# Patient Record
Sex: Female | Born: 1955 | Race: Black or African American | Hispanic: No | Marital: Single | Smoking: Never smoker
Health system: Southern US, Community
[De-identification: ages and names within clinical notes are randomized; demographics above are authoritative.]

## PROBLEM LIST (undated history)

## (undated) DIAGNOSIS — Z9221 Personal history of antineoplastic chemotherapy: Secondary | ICD-10-CM

## (undated) DIAGNOSIS — Z95810 Presence of automatic (implantable) cardiac defibrillator: Secondary | ICD-10-CM

## (undated) DIAGNOSIS — Z808 Family history of malignant neoplasm of other organs or systems: Secondary | ICD-10-CM

## (undated) DIAGNOSIS — I509 Heart failure, unspecified: Secondary | ICD-10-CM

## (undated) DIAGNOSIS — C801 Malignant (primary) neoplasm, unspecified: Secondary | ICD-10-CM

## (undated) DIAGNOSIS — Z923 Personal history of irradiation: Secondary | ICD-10-CM

## (undated) DIAGNOSIS — I10 Essential (primary) hypertension: Secondary | ICD-10-CM

## (undated) DIAGNOSIS — Z803 Family history of malignant neoplasm of breast: Secondary | ICD-10-CM

## (undated) DIAGNOSIS — E119 Type 2 diabetes mellitus without complications: Secondary | ICD-10-CM

## (undated) DIAGNOSIS — Z8042 Family history of malignant neoplasm of prostate: Secondary | ICD-10-CM

## (undated) HISTORY — PX: ABDOMINAL AORTIC ANEURYSM REPAIR: SUR1152

## (undated) HISTORY — DX: Family history of malignant neoplasm of breast: Z80.3

## (undated) HISTORY — DX: Family history of malignant neoplasm of prostate: Z80.42

## (undated) HISTORY — PX: CARDIAC CATHETERIZATION: SHX172

## (undated) HISTORY — DX: Malignant (primary) neoplasm, unspecified: C80.1

## (undated) HISTORY — PX: ABDOMINAL HYSTERECTOMY: SHX81

## (undated) HISTORY — DX: Family history of malignant neoplasm of other organs or systems: Z80.8

---

## 1999-02-08 ENCOUNTER — Encounter: Payer: Self-pay | Admitting: *Deleted

## 1999-02-13 ENCOUNTER — Inpatient Hospital Stay (HOSPITAL_COMMUNITY): Admission: RE | Admit: 1999-02-13 | Discharge: 1999-02-16 | Payer: Self-pay | Admitting: *Deleted

## 1999-03-21 ENCOUNTER — Encounter (HOSPITAL_COMMUNITY): Admission: RE | Admit: 1999-03-21 | Discharge: 1999-06-19 | Payer: Self-pay | Admitting: Interventional Cardiology

## 1999-08-14 ENCOUNTER — Ambulatory Visit (HOSPITAL_COMMUNITY): Admission: RE | Admit: 1999-08-14 | Discharge: 1999-08-14 | Payer: Self-pay | Admitting: Internal Medicine

## 1999-08-14 ENCOUNTER — Encounter: Payer: Self-pay | Admitting: Internal Medicine

## 1999-08-17 ENCOUNTER — Encounter: Payer: Self-pay | Admitting: Internal Medicine

## 1999-08-17 ENCOUNTER — Ambulatory Visit (HOSPITAL_COMMUNITY): Admission: RE | Admit: 1999-08-17 | Discharge: 1999-08-17 | Payer: Self-pay | Admitting: Internal Medicine

## 2000-12-12 ENCOUNTER — Other Ambulatory Visit: Admission: RE | Admit: 2000-12-12 | Discharge: 2000-12-12 | Payer: Self-pay | Admitting: Internal Medicine

## 2000-12-17 ENCOUNTER — Ambulatory Visit (HOSPITAL_COMMUNITY): Admission: RE | Admit: 2000-12-17 | Discharge: 2000-12-17 | Payer: Self-pay | Admitting: Internal Medicine

## 2000-12-17 ENCOUNTER — Encounter: Payer: Self-pay | Admitting: Internal Medicine

## 2000-12-23 ENCOUNTER — Encounter: Admission: RE | Admit: 2000-12-23 | Discharge: 2000-12-23 | Payer: Self-pay | Admitting: Internal Medicine

## 2000-12-23 ENCOUNTER — Encounter: Payer: Self-pay | Admitting: Internal Medicine

## 2001-01-10 ENCOUNTER — Encounter: Admission: RE | Admit: 2001-01-10 | Discharge: 2001-04-10 | Payer: Self-pay | Admitting: Internal Medicine

## 2004-03-06 ENCOUNTER — Encounter: Admission: RE | Admit: 2004-03-06 | Discharge: 2004-03-06 | Payer: Self-pay | Admitting: Internal Medicine

## 2006-03-05 ENCOUNTER — Encounter: Admission: RE | Admit: 2006-03-05 | Discharge: 2006-03-05 | Payer: Self-pay | Admitting: Internal Medicine

## 2006-03-25 ENCOUNTER — Encounter (INDEPENDENT_AMBULATORY_CARE_PROVIDER_SITE_OTHER): Payer: Self-pay | Admitting: *Deleted

## 2006-03-25 ENCOUNTER — Ambulatory Visit (HOSPITAL_COMMUNITY): Admission: RE | Admit: 2006-03-25 | Discharge: 2006-03-25 | Payer: Self-pay | Admitting: Internal Medicine

## 2006-05-08 ENCOUNTER — Ambulatory Visit (HOSPITAL_COMMUNITY): Admission: RE | Admit: 2006-05-08 | Discharge: 2006-05-08 | Payer: Self-pay | Admitting: Gastroenterology

## 2007-03-12 ENCOUNTER — Encounter: Admission: RE | Admit: 2007-03-12 | Discharge: 2007-03-12 | Payer: Self-pay | Admitting: Family Medicine

## 2010-01-31 ENCOUNTER — Encounter: Admission: RE | Admit: 2010-01-31 | Discharge: 2010-01-31 | Payer: Self-pay | Admitting: Family Medicine

## 2010-12-10 ENCOUNTER — Encounter: Payer: Self-pay | Admitting: Internal Medicine

## 2011-03-12 ENCOUNTER — Other Ambulatory Visit: Payer: Self-pay | Admitting: Family Medicine

## 2011-03-12 DIAGNOSIS — Z1231 Encounter for screening mammogram for malignant neoplasm of breast: Secondary | ICD-10-CM

## 2011-03-16 ENCOUNTER — Ambulatory Visit
Admission: RE | Admit: 2011-03-16 | Discharge: 2011-03-16 | Disposition: A | Discharge status: Home / Self Care | Payer: 59 | Source: Ambulatory Visit | Attending: Family Medicine | Admitting: Family Medicine

## 2011-03-16 DIAGNOSIS — Z1231 Encounter for screening mammogram for malignant neoplasm of breast: Secondary | ICD-10-CM

## 2011-04-06 NOTE — Op Note (Signed)
NAMEMISA, Toni [name]                ACCOUNT NO.:  [id number]   MEDICAL RECORD NO.:  [id number]          PATIENT TYPE:  AMB   LOCATION:  ENDO                         FACILITY:  [facility]   PHYSICIAN:  Danise Edge, M.D.   DATE OF BIRTH:  07/22/1956   DATE OF PROCEDURE:  05/08/2006  DATE OF DISCHARGE:                                 OPERATIVE REPORT   REFERRING PHYSICIAN:  Dr. Julieanne Manson.   PROCEDURE INDICATIONS:  Toni [name] is a 55 year old female born 1955/12/12.  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  She is  scheduled to undergo her first screening colonoscopy with polypectomy to  prevent colon cancer.   ENDOSCOPIST:  Danise Edge, M.D.   PREMEDICATION:  Fentanyl 75 mcg, Versed 5 mg.   PROCEDURE:  After obtaining informed consent, [name] was placed in the  left lateral decubitus position.  I administered intravenous fentanyl and  intravenous Versed to achieve conscious sedation for the procedure.  The  patient's blood pressure, oxygen saturation and cardiac rhythm were  monitored throughout the procedure and documented in the medical record.   Anal inspection and digital rectal exam were normal.  The Olympus adjustable  pediatric colonoscope was introduced into the rectum and advanced to the  cecum.  A normal-appearing ileocecal valve and appendiceal orifice were  identified.  Colonic preparation for the exam today was excellent.   Rectum normal.  Retroflexed view of the distal rectum normal.  Sigmoid colon and descending colon normal.  Splenic flexure normal.  Transverse colon normal.  Hepatic flexure normal.  Ascending colon normal.  Cecum and ileocecal valve normal.   ASSESSMENT:  Normal screening proctocolonoscopy to the cecum.  No endoscopic  evidence for the presence of colorectal neoplasia.   RECOMMENDATIONS:  Repeat screening optical colonoscopy in 10 years.  Consider screening virtual colonoscopy in 5 years if technology  is  available.           ______________________________  Danise Edge, M.D.     MJ/MEDQ  D:  05/08/2006  T:  05/08/2006  Job:  [id number]   cc:   Marcene Duos, M.D.  Fax: [phone]

## 2011-07-04 ENCOUNTER — Other Ambulatory Visit: Payer: Self-pay | Admitting: Family Medicine

## 2011-07-04 NOTE — Telephone Encounter (Signed)
Dr.Knapp,  Pt not seen here and no future appt scheduled. Thanks.

## 2011-08-15 ENCOUNTER — Ambulatory Visit (HOSPITAL_COMMUNITY)
Admission: RE | Admit: 2011-08-15 | Discharge: 2011-08-15 | Disposition: A | Discharge status: Home / Self Care | Payer: 59 | Source: Ambulatory Visit | Attending: Interventional Cardiology | Admitting: Interventional Cardiology

## 2011-08-15 DIAGNOSIS — E785 Hyperlipidemia, unspecified: Secondary | ICD-10-CM | POA: Insufficient documentation

## 2011-08-15 DIAGNOSIS — E119 Type 2 diabetes mellitus without complications: Secondary | ICD-10-CM | POA: Insufficient documentation

## 2011-08-15 DIAGNOSIS — I509 Heart failure, unspecified: Secondary | ICD-10-CM | POA: Insufficient documentation

## 2011-08-15 DIAGNOSIS — I10 Essential (primary) hypertension: Secondary | ICD-10-CM | POA: Insufficient documentation

## 2012-05-05 ENCOUNTER — Other Ambulatory Visit: Payer: Self-pay | Admitting: Family Medicine

## 2012-05-05 DIAGNOSIS — Z1231 Encounter for screening mammogram for malignant neoplasm of breast: Secondary | ICD-10-CM

## 2012-05-06 ENCOUNTER — Ambulatory Visit
Admission: RE | Admit: 2012-05-06 | Discharge: 2012-05-06 | Disposition: A | Discharge status: Home / Self Care | Payer: 59 | Source: Ambulatory Visit | Attending: Family Medicine | Admitting: Family Medicine

## 2012-05-06 DIAGNOSIS — Z1231 Encounter for screening mammogram for malignant neoplasm of breast: Secondary | ICD-10-CM

## 2012-11-18 ENCOUNTER — Other Ambulatory Visit: Payer: Self-pay | Admitting: Family Medicine

## 2012-11-18 DIAGNOSIS — Z780 Asymptomatic menopausal state: Secondary | ICD-10-CM

## 2013-01-22 ENCOUNTER — Encounter: Payer: 59 | Attending: Family Medicine | Admitting: *Deleted

## 2013-01-22 ENCOUNTER — Encounter: Payer: Self-pay | Admitting: *Deleted

## 2013-01-22 DIAGNOSIS — Z713 Dietary counseling and surveillance: Secondary | ICD-10-CM | POA: Insufficient documentation

## 2013-01-22 DIAGNOSIS — E119 Type 2 diabetes mellitus without complications: Secondary | ICD-10-CM | POA: Insufficient documentation

## 2013-01-22 NOTE — Patient Instructions (Signed)
Plan:  Aim for 3 Carb Choices per meal (45 grams) +/- 1 either way  Aim for 0-1 Carbs per snack if hungry  Consider reading food labels for Total Carbohydrate and Fat Grams of foods Consider ways of increasing your activity level by classes at the gym or treadmill for 15-30 minutes daily as tolerated

## 2013-01-22 NOTE — Progress Notes (Signed)
  Medical Nutrition Therapy:  Appt start time: 1530 end time:  1630.  Assessment:  Primary concerns today: patient self referred for diabetes .Works full time at [employer] in [employer] [place] from 8-4:30. Lives alone. Enjoys yard Airline pilot, shopping, reading, going to gym if going with somebody else. Shops and cooks her own meals  MEDICATIONS: see list   DIETARY INTAKE:  Usual eating pattern includes 3 meals and 0 snacks per day.  Everyday foods include good variety of all food groups.  Avoided foods include sodas.    24-hr recall:  B ( AM): not hungry but making herself eat instant oatmeal packet, organic milk, egg whites OR Protein bar with milk OR Glucerna  Snk ( AM): none  L ( PM): brings frozen dinner OR salad from cafeteria.OR sandwich and fruit OR UTZ snack from vending machine, Crystal LIght or water Snk ( PM): not usually D ( PM): hungry, eats thru the night, frozen dinner OR pick up [employer] OR subway OR meat, starch vegetable type meal, enjoys dessert Snk ( PM): none Beverages: water, Crystal LIght OR Mio,   Usual physical activity: treadmill at home and has [employer] with aerobic and machines  Estimated energy needs: 1400 calories 158 g carbohydrates 105 g protein 39 g fat  Progress Towards Goal(s):  In progress.   Nutritional Diagnosis:  NB-1.1 Food and nutrition-related knowledge deficit As related to new diagnosis of diabetes.  As evidenced by A1c if 6.7%.    Intervention:  Nutrition counseling and diabetes education initiated. Discussed basic physiology of diabetes, SMBG and rationale of checking BG at alternate times of day, A1c, Carb Counting and reading food labels, and benefits of increased activity.   Plan:  Aim for 3 Carb Choices per meal (45 grams) +/- 1 either way  Aim for 0-1 Carbs per snack if hungry  Consider reading food labels for Total Carbohydrate and Fat Grams of foods Consider ways of increasing your activity level by classes at the gym or  treadmill for 15-30 minutes daily as tolerated  Handouts given during visit include: Living Well with Diabetes Carb Counting and Food Label handouts Meal Plan Card   Monitoring/Evaluation:  Dietary intake, exercise, reading food labels, and body weight prn.

## 2013-05-14 ENCOUNTER — Encounter (HOSPITAL_COMMUNITY): Payer: Self-pay | Admitting: Emergency Medicine

## 2013-05-14 ENCOUNTER — Emergency Department (HOSPITAL_COMMUNITY): Payer: 59

## 2013-05-14 ENCOUNTER — Emergency Department (HOSPITAL_COMMUNITY)
Admission: EM | Admit: 2013-05-14 | Discharge: 2013-05-14 | Disposition: A | Discharge status: Home / Self Care | Payer: 59 | Attending: Emergency Medicine | Admitting: Emergency Medicine

## 2013-05-14 DIAGNOSIS — I509 Heart failure, unspecified: Secondary | ICD-10-CM | POA: Insufficient documentation

## 2013-05-14 DIAGNOSIS — R109 Unspecified abdominal pain: Secondary | ICD-10-CM

## 2013-05-14 DIAGNOSIS — I2510 Atherosclerotic heart disease of native coronary artery without angina pectoris: Secondary | ICD-10-CM | POA: Insufficient documentation

## 2013-05-14 DIAGNOSIS — N201 Calculus of ureter: Secondary | ICD-10-CM | POA: Insufficient documentation

## 2013-05-14 DIAGNOSIS — Z79899 Other long term (current) drug therapy: Secondary | ICD-10-CM | POA: Insufficient documentation

## 2013-05-14 DIAGNOSIS — E119 Type 2 diabetes mellitus without complications: Secondary | ICD-10-CM | POA: Insufficient documentation

## 2013-05-14 DIAGNOSIS — I10 Essential (primary) hypertension: Secondary | ICD-10-CM | POA: Insufficient documentation

## 2013-05-14 DIAGNOSIS — R112 Nausea with vomiting, unspecified: Secondary | ICD-10-CM | POA: Insufficient documentation

## 2013-05-14 HISTORY — DX: Essential (primary) hypertension: I10

## 2013-05-14 HISTORY — DX: Heart failure, unspecified: I50.9

## 2013-05-14 HISTORY — DX: Type 2 diabetes mellitus without complications: E11.9

## 2013-05-14 LAB — COMPREHENSIVE METABOLIC PANEL
CO2: 28 mEq/L (ref 19–32)
Chloride: 99 mEq/L (ref 96–112)
GFR calc non Af Amer: >90 mL/min (ref >90)
Glucose, Bld: 219 mg/dL — ABNORMAL HIGH (ref 70–99)
Sodium: 137 mEq/L (ref 135–145)
Total Bilirubin: 0.3 mg/dL (ref 0.3–1.2)
Total Protein: 8 g/dL (ref 6.0–8.3)

## 2013-05-14 LAB — POCT I-STAT, CHEM 8
BUN: 14 mg/dL (ref 6–23)
Calcium, Ion: 1.17 mmol/L (ref 1.12–1.23)
Chloride: 100 mEq/L (ref 96–112)
Glucose, Bld: 219 mg/dL — ABNORMAL HIGH (ref 70–99)
Sodium: 139 mEq/L (ref 135–145)
TCO2: 27 mmol/L (ref 0–100)

## 2013-05-14 LAB — URINE MICROSCOPIC-ADD ON

## 2013-05-14 LAB — CBC WITH DIFFERENTIAL/PLATELET
Basophils Absolute: 0 10*3/uL (ref 0.0–0.1)
Basophils Relative: 0 % (ref 0–1)
Eosinophils Absolute: 0.2 10*3/uL (ref 0.0–0.7)
Eosinophils Relative: 2 % (ref 0–5)
Lymphocytes Relative: 13 % (ref 12–46)
Lymphs Abs: 1.3 10*3/uL (ref 0.7–4.0)
MCV: 87 fL (ref 78.0–100.0)
Monocytes Absolute: 0.4 10*3/uL (ref 0.1–1.0)
Monocytes Relative: 4 % (ref 3–12)
Neutro Abs: 7.9 10*3/uL — ABNORMAL HIGH (ref 1.7–7.7)
RBC: 4.71 MIL/uL (ref 3.87–5.11)
WBC: 9.7 10*3/uL (ref 4.0–10.5)

## 2013-05-14 LAB — URINALYSIS, ROUTINE W REFLEX MICROSCOPIC
Bilirubin Urine: NEGATIVE
Protein, ur: NEGATIVE mg/dL
Specific Gravity, Urine: 1.023 (ref 1.005–1.030)
pH: 7 (ref 5.0–8.0)

## 2013-05-14 MED ORDER — IBUPROFEN 600 MG PO TABS: 600.0000 mg | ORAL_TABLET | Freq: Four times a day (QID) | ORAL | Status: DC | PRN

## 2013-05-14 MED ORDER — TAMSULOSIN HCL 0.4 MG PO CAPS: 0.4000 mg | ORAL_CAPSULE | Freq: Every day | ORAL | Status: DC

## 2013-05-14 MED ORDER — ONDANSETRON HCL 4 MG/2ML IJ SOLN
4.0000 mg | Freq: Once | INTRAMUSCULAR | Status: AC
  Administered 2013-05-14: 4 mg via INTRAVENOUS
  Filled 2013-05-14: qty 2

## 2013-05-14 MED ORDER — ONDANSETRON 4 MG PO TBDP: ORAL_TABLET | ORAL | Status: DC

## 2013-05-14 MED ORDER — SODIUM CHLORIDE 0.9 % IV BOLUS (SEPSIS)
1000.0000 mL | Freq: Once | INTRAVENOUS | Status: AC
  Administered 2013-05-14: 1000 mL via INTRAVENOUS

## 2013-05-14 MED ORDER — MORPHINE SULFATE 4 MG/ML IJ SOLN
4.0000 mg | Freq: Once | INTRAMUSCULAR | Status: AC
  Administered 2013-05-14: 4 mg via INTRAVENOUS
  Filled 2013-05-14: qty 1

## 2013-05-14 MED ORDER — HYDROCODONE-ACETAMINOPHEN 5-325 MG PO TABS: 1.0000 | ORAL_TABLET | ORAL | Status: DC | PRN

## 2013-05-14 MED ORDER — IOHEXOL 300 MG/ML  SOLN: 25.0000 mL | INTRAMUSCULAR | Status: AC

## 2013-05-14 MED ORDER — IOHEXOL 300 MG/ML  SOLN
100.0000 mL | Freq: Once | INTRAMUSCULAR | Status: AC | PRN
  Administered 2013-05-14: 100 mL via INTRAVENOUS

## 2013-05-14 NOTE — ED Notes (Signed)
Pt reports RLQ pain which began this AM. Pt states she was at woke this AM and began feeling bad vomiting. Denies diarrhea or fever. Seen at [facility] this AM and sent here for further eval. Pt alert, oriented x4, moderate amount of distress and guarding right side.

## 2013-05-14 NOTE — ED Provider Notes (Signed)
I saw and evaluated the patient, reviewed the resident's note and I agree with the findings and plan. Patient with abdominal pain. Stone on CT. Pain is controlled. Will follow with urology  Juliet Rude. Rubin Payor, MD 05/14/13 1624

## 2013-05-14 NOTE — ED Provider Notes (Signed)
History    CSN: [id number] Arrival date & time 05/14/13  4098  First MD Initiated Contact with Patient 05/14/13 1001     Chief Complaint  Patient presents with  . Abdominal Pain   (Consider location/radiation/quality/duration/timing/severity/associated sxs/prior Treatment) Patient is a 57 y.o. female presenting with abdominal pain. The history is provided by the patient.  Abdominal Pain This is a new problem. The current episode started today. The problem occurs constantly. The problem has been gradually worsening. Associated symptoms include abdominal pain, anorexia, nausea and vomiting. Pertinent negatives include no chest pain, chills or fever. Nothing aggravates the symptoms. She has tried nothing for the symptoms.   Past Medical History  Diagnosis Date  . Diabetes mellitus without complication   . Hypertension   . CHF (congestive heart failure)   . Coronary artery disease    Past Surgical History  Procedure Laterality Date  . Abdominal hysterectomy    . Cardiac surgery     History reviewed. No pertinent family history. History  Substance Use Topics  . Smoking status: Never Smoker   . Smokeless tobacco: Never Used  . Alcohol Use: Yes     Comment: less than once a month   OB History   Grav Para Term Preterm Abortions TAB SAB Ect Mult Living                 Review of Systems  Constitutional: Negative for fever and chills.  Respiratory: Negative for chest tightness and shortness of breath.   Cardiovascular: Negative for chest pain.  Gastrointestinal: Positive for nausea, vomiting, abdominal pain and anorexia. Negative for diarrhea.  Genitourinary: Negative for dysuria, frequency and hematuria.  Musculoskeletal: Negative for back pain.  Neurological: Negative for dizziness.  All other systems reviewed and are negative.    Allergies  Biaxin and Vasotec  Home Medications   Current Outpatient Rx  Name  Route  Sig  Dispense  Refill  . carvedilol (COREG) 25 MG  tablet   Oral   Take 25 mg by mouth 2 (two) times daily with a meal.         . felodipine (PLENDIL) 2.5 MG 24 hr tablet   Oral   Take 2.5 mg by mouth daily.         . furosemide (LASIX) 20 MG tablet   Oral   Take 20 mg by mouth 2 (two) times daily.         Marland Kitchen glipiZIDE (GLUCOTROL XL) 10 MG 24 hr tablet   Oral   Take 10 mg by mouth daily.         . Liraglutide (VICTOZA [place])   Subcutaneous   Inject into the skin.         . metFORMIN (GLUCOPHAGE) 1000 MG tablet   Oral   Take 1,000 mg by mouth 2 (two) times daily with a meal.         . simvastatin (ZOCOR) 20 MG tablet   Oral   Take 20 mg by mouth every evening.         . valsartan (DIOVAN) 320 MG tablet   Oral   Take 320 mg by mouth daily.          BP 159/81  Pulse 77  Temp(Src) 98.4 F (36.9 C) (Oral)  SpO2 96% Physical Exam  Nursing note and vitals reviewed. Constitutional: She is oriented to person, place, and time. She appears well-developed and well-nourished. No distress.  HENT:  Head: Normocephalic and atraumatic.  Mouth/Throat: Oropharynx  is clear and moist.  Eyes: EOM are normal. Pupils are equal, round, and reactive to light.  Neck: Normal range of motion. Neck supple.  Cardiovascular: Normal rate, regular rhythm and normal heart sounds.  Exam reveals no friction rub.   No murmur heard. Pulmonary/Chest: Effort normal and breath sounds normal. No respiratory distress. She has no wheezes. She has no rales.  Abdominal: Soft. There is tenderness (moderate TTP RLQ. ). There is guarding (guarding). There is no rebound.  Musculoskeletal: Normal range of motion. She exhibits no edema and no tenderness.  Lymphadenopathy:    She has no cervical adenopathy.  Neurological: She is alert and oriented to person, place, and time.  Skin: Skin is warm and dry. No rash noted.  Psychiatric: She has a normal mood and affect. Her behavior is normal.    ED Course  Procedures (including critical care time) Labs  Reviewed  CBC WITH DIFFERENTIAL - Abnormal; Notable for the following:    Neutrophils Relative % 81 (*)    Neutro Abs 7.9 (*)    All other components within normal limits  COMPREHENSIVE METABOLIC PANEL - Abnormal; Notable for the following:    Glucose, Bld 219 (*)    All other components within normal limits  URINALYSIS, ROUTINE W REFLEX MICROSCOPIC - Abnormal; Notable for the following:    Glucose, UA 100 (*)    Hgb urine dipstick LARGE (*)    All other components within normal limits  POCT I-STAT, CHEM 8 - Abnormal; Notable for the following:    Glucose, Bld 219 (*)    All other components within normal limits  URINE MICROSCOPIC-ADD ON   Ct Abdomen Pelvis W Contrast  05/14/2013   *RADIOLOGY REPORT*  Clinical Data: Right lower quadrant pain, vomiting  CT ABDOMEN AND PELVIS WITH CONTRAST  Technique:  Multidetector CT imaging of the abdomen and pelvis was performed following the standard protocol during bolus administration of intravenous contrast.  Contrast: OMNIPAQUE IOHEXOL 300 MG/ML  SOLN  Comparison: None.  Findings: Pleural thickening is noted and there are small pleural effusions present bilaterally.  The liver is diffusely low in attenuation consistent with fatty infiltration.  No focal abnormality is seen.  No calcified gallstones are noted.  The pancreas is normal in size and the pancreatic duct is slightly prominent.  The peripancreatic fat planes are well preserved.  The adrenal glands and spleen are unremarkable.  The kidneys enhance although there appears to be somewhat diminished enhancement of the right kidney.  There is moderate right hydronephrosis present to a point of obstruction by a 4 x 4 x 6 mm proximal right ureteral calculus.  No additional renal calculi are seen. The obstructing calculus causes delayed excretion of the contrast media by the right kidney.  The abdominal aorta is normal in caliber.  No adenopathy is seen.  The appendix as well visualized in the right  pelvis and fills with air normally.  There is no evidence of appendicitis.  Terminal ileum also is unremarkable.  The urinary bladder is moderately urine distended with no abnormality noted.  The uterus appears to have been resected.  No adnexal lesion is seen and no fluid is noted within the pelvis.  The colon is largely decompressed.  No mucosal edema is seen.  No anterior abdominal wall hernia is noted. The lumbar vertebrae are in normal alignment.  IMPRESSION:  1.  Moderate right hydronephrosis caused by a 4 x 4 x 6 mm proximal right ureteral calculus.  No other  renal calculi are seen. Delayed excretion is noted on the right kidney. 2.  The appendix is well visualized and appears normal. 3.  Small bilateral pleural effusions.   Original Report Authenticated By: Dwyane Dee, M.D.   1. Ureterolithiasis   2. Abdominal pain   3. Nausea and vomiting     MDM  48:33 AM 57 year old female with history of diabetes, hypertension and CAD presented with gradual onset right lower quadrant abdominal pain that progressively worsened with nausea, vomiting and anorexia. She denies fever. Vital signs are stable here. She has focal tenderness of her right lower quadrant with voluntary guarding but no peritonitis. We'll check labs, urinalysis, CT scan. She is status post hysterectomy.  1:47 PM CT shows stone in proximal right ureter with mild hydro. No UTI. Labs with no significant abnormality. Pain well controlled here. Pt is stable. Will dc with norco, zofran and flomax. Given number to call for urology f/u. Pt voiced understanding of plan, given return precautions and dc'd home in stable condition.   Caren Hazy, MD 05/14/13 1445

## 2013-05-16 ENCOUNTER — Telehealth (HOSPITAL_COMMUNITY): Payer: Self-pay | Admitting: Emergency Medicine

## 2013-05-16 NOTE — ED Notes (Signed)
Patient calling to know what her diagnosis was because she has misplaced her discharge papers.

## 2013-09-24 ENCOUNTER — Other Ambulatory Visit: Payer: Self-pay

## 2014-01-03 ENCOUNTER — Encounter: Payer: Self-pay | Admitting: *Deleted

## 2014-01-03 DIAGNOSIS — T783XXA Angioneurotic edema, initial encounter: Secondary | ICD-10-CM | POA: Insufficient documentation

## 2014-01-03 DIAGNOSIS — I10 Essential (primary) hypertension: Secondary | ICD-10-CM | POA: Insufficient documentation

## 2014-09-10 ENCOUNTER — Other Ambulatory Visit: Payer: Self-pay | Admitting: Family Medicine

## 2014-09-10 DIAGNOSIS — Z1231 Encounter for screening mammogram for malignant neoplasm of breast: Secondary | ICD-10-CM

## 2014-09-17 ENCOUNTER — Ambulatory Visit
Admission: RE | Admit: 2014-09-17 | Discharge: 2014-09-17 | Disposition: A | Discharge status: Home / Self Care | Payer: 59 | Source: Ambulatory Visit | Attending: Family Medicine | Admitting: Family Medicine

## 2014-09-17 DIAGNOSIS — Z1231 Encounter for screening mammogram for malignant neoplasm of breast: Secondary | ICD-10-CM

## 2014-09-21 ENCOUNTER — Other Ambulatory Visit: Payer: Self-pay | Admitting: Family Medicine

## 2014-09-21 DIAGNOSIS — R928 Other abnormal and inconclusive findings on diagnostic imaging of breast: Secondary | ICD-10-CM

## 2014-09-24 ENCOUNTER — Ambulatory Visit
Admission: RE | Admit: 2014-09-24 | Discharge: 2014-09-24 | Disposition: A | Discharge status: Home / Self Care | Payer: 59 | Source: Ambulatory Visit | Attending: Family Medicine | Admitting: Family Medicine

## 2014-09-24 DIAGNOSIS — R928 Other abnormal and inconclusive findings on diagnostic imaging of breast: Secondary | ICD-10-CM

## 2014-09-29 ENCOUNTER — Encounter: Payer: Self-pay | Admitting: Interventional Cardiology

## 2014-10-25 ENCOUNTER — Encounter: Payer: 59 | Admitting: Interventional Cardiology

## 2014-11-02 ENCOUNTER — Encounter: Payer: Self-pay | Admitting: Interventional Cardiology

## 2015-02-16 ENCOUNTER — Other Ambulatory Visit: Payer: Self-pay | Admitting: *Deleted

## 2015-02-17 DIAGNOSIS — E118 Type 2 diabetes mellitus with unspecified complications: Secondary | ICD-10-CM | POA: Insufficient documentation

## 2015-02-17 NOTE — Patient Outreach (Signed)
[facility] [facility]) Care Management   02/17/2015  Toni [name] 1956/03/02 [id number]  Toni [name] is an 59 y.o. female and works as a Development worker, community at [employer].  Subjective:   Toni [name] voices no complaints but states she has gained weight and is not feeling motivated to change diet or exercise. Also says she has not been checking her blood sugars but does want to start again.  Objective:   Review of Systems  Constitutional: Negative.   All other systems reviewed and are negative.   Physical Exam  Constitutional: She is oriented to person, place, and time. She appears well-developed and well-nourished.  Neurological: She is alert and oriented to person, place, and time.  Psychiatric: She has a normal mood and affect. Her behavior is normal. Judgment and thought content normal.   Filed Vitals:   02/16/15 1430  BP: 138/88   Filed Weights   02/16/15 1430  Weight: 180 lb 9.6 oz (81.92 kg)     Current Medications:   Current Outpatient Prescriptions  Medication Sig Dispense Refill  . carvedilol (COREG) 25 MG tablet Take 25 mg by mouth 2 (two) times daily with a meal.    . furosemide (LASIX) 20 MG tablet Take 20 mg by mouth daily.     Marland Kitchen glipiZIDE (GLUCOTROL XL) 10 MG 24 hr tablet Take 10 mg by mouth daily.    . Liraglutide (VICTOZA [place]) Inject into the skin.    . metFORMIN (GLUCOPHAGE) 1000 MG tablet Take 1,000 mg by mouth 2 (two) times daily with a meal.    . Multiple Vitamins-Minerals (CENTRUM SILVER PO) Take 1 tablet by mouth daily.    . simvastatin (ZOCOR) 20 MG tablet Take 20 mg by mouth every evening.    . valsartan (DIOVAN) 320 MG tablet Take 320 mg by mouth daily.    . felodipine (PLENDIL) 2.5 MG 24 hr tablet Take 2.5 mg by mouth daily.    Marland Kitchen HYDROcodone-acetaminophen (NORCO/VICODIN) 5-325 MG per tablet Take 1 tablet by mouth every 4 (four) hours as needed for pain. (Patient not taking: Reported on 02/16/2015) 30 tablet 0  . ibuprofen  (ADVIL,MOTRIN) 600 MG tablet Take 1 tablet (600 mg total) by mouth every 6 (six) hours as needed for pain. (Patient not taking: Reported on 02/16/2015) 30 tablet 0  . ondansetron (ZOFRAN ODT) 4 MG disintegrating tablet 4mg  ODT q4 hours prn nausea/vomit (Patient not taking: Reported on 02/16/2015) 15 tablet 0  . tamsulosin (FLOMAX) 0.4 MG CAPS Take 1 capsule (0.4 mg total) by mouth daily. (Patient not taking: Reported on 02/16/2015) 15 capsule 0   No current facility-administered medications for this visit.    Functional Status:   In your present state of health, do you have any difficulty performing the following activities: 02/16/2015  Is the patient deaf or have difficulty hearing? N  Hearing N  Vision N  Difficulty concentrating or making decisions N  Walking or climbing stairs? N  Doing errands, shopping? N    Fall/Depression Screening:    No flowsheet data found.   Felicity        Patient Outreach from 02/16/2015 in [place] Problem One  Type 2 DM with most recent A1C= 7.6% and weight gain of 7 lbs in last 3.5 months   Care Plan for Problem One  Active   Interventions for Problem One Long Term Goal  discussed strategies for weight loss, reviewed effect  of exercise on insulin resistance, reviewed CBG pre and post meal targets, encouraged Toni [name] to resume checking blood sugars to help her with  improved blood sugar control   THN Long Term Goal (31-90 days)  Improved glycemic control as evidenced by A1C<7.5% at next check and no additional weight gain   THN Long Term Goal Start Date  02/16/15       Assessment:    Type II DM currently not meeting A1C target of <7.0%  Plan:   Will request refills on True Result test strips from Dr. Chapman Fitch as Toni [name] wishes to resume self monitoring of blood glucose with goal of 3 times daily.  Will meet with Toni [name] at least quarterly or more often to assist with Type 2 DM self management and to assess  progress toward mutually set goals.     Barrington Ellison RN,CCM,CDE [place] Management Coordinator Office Phone [phone]

## 2015-03-08 ENCOUNTER — Other Ambulatory Visit: Payer: Self-pay | Admitting: Family Medicine

## 2015-03-08 DIAGNOSIS — E2839 Other primary ovarian failure: Secondary | ICD-10-CM

## 2015-03-08 DIAGNOSIS — N6320 Unspecified lump in the left breast, unspecified quadrant: Secondary | ICD-10-CM

## 2015-03-31 ENCOUNTER — Encounter: Payer: Self-pay | Admitting: *Deleted

## 2015-04-05 ENCOUNTER — Encounter: Payer: Self-pay | Admitting: *Deleted

## 2015-04-22 ENCOUNTER — Other Ambulatory Visit: Payer: 59

## 2015-04-29 ENCOUNTER — Ambulatory Visit
Admission: RE | Admit: 2015-04-29 | Discharge: 2015-04-29 | Disposition: A | Discharge status: Home / Self Care | Payer: 59 | Source: Ambulatory Visit | Attending: Family Medicine | Admitting: Family Medicine

## 2015-04-29 DIAGNOSIS — E2839 Other primary ovarian failure: Secondary | ICD-10-CM

## 2015-04-29 DIAGNOSIS — N6320 Unspecified lump in the left breast, unspecified quadrant: Secondary | ICD-10-CM

## 2015-05-03 ENCOUNTER — Other Ambulatory Visit: Payer: Self-pay | Admitting: Family Medicine

## 2015-05-16 ENCOUNTER — Other Ambulatory Visit: Payer: Self-pay

## 2015-09-22 ENCOUNTER — Telehealth: Payer: Self-pay

## 2015-09-30 ENCOUNTER — Encounter: Payer: Self-pay | Admitting: *Deleted

## 2015-09-30 ENCOUNTER — Other Ambulatory Visit: Payer: Self-pay | Admitting: *Deleted

## 2015-09-30 VITALS — BP 122/78

## 2015-09-30 DIAGNOSIS — E119 Type 2 diabetes mellitus without complications: Secondary | ICD-10-CM

## 2015-09-30 LAB — POCT CBG (FASTING - GLUCOSE)-MANUAL ENTRY: Glucose Fasting, POC: 98 mg/dL (ref 70–99)

## 2015-09-30 LAB — POCT GLYCOSYLATED HEMOGLOBIN (HGB A1C): Hemoglobin A1C: 9

## 2015-09-30 NOTE — Patient Outreach (Addendum)
Toni [name]) Care Management   09/30/2015  Toni [name] 07-03-1956 [id number]  Toni [name] is an 59 y.o. female who presents to the [place] Management office for routine Link To Wellness follow up for self management assistance with Type II DM, HTN and hyperlipidemia.  Subjective:  Toni [name] states she has not been following a CHO controlled meal plan, checking her blood sugars or exercising. She says she is adherent with her medications. She said she consisently exercised 3 times daily and was adherent to a CHO controlled meal plan for the 2 months leading up to her 59th birthday but then stopped because she lost her motivation. She is requesting a POC Hgb A1C because she says if the result is not good it will motivated her to change her behavior.   Objective:   Review of Systems  Constitutional: Negative.     Physical Exam  Constitutional: She is oriented to person, place, and time. She appears well-developed and well-nourished.  Respiratory: Effort normal.  Neurological: She is alert and oriented to person, place, and time.  Skin: Skin is warm and dry.  Psychiatric: She has a normal mood and affect. Her behavior is normal. Judgment and thought content normal.   Filed Vitals:   09/30/15 1430  BP: 122/78  POC Hgb A1C= 9.0% POC CBG= 98 ( 4 hours post prandial)  Current Medications:   Current Outpatient Prescriptions  Medication Sig Dispense Refill  . carvedilol (COREG) 25 MG tablet Take 25 mg by mouth 2 (two) times daily with a meal.    . felodipine (PLENDIL) 2.5 MG 24 hr tablet Take 2.5 mg by mouth daily.    . furosemide (LASIX) 20 MG tablet Take 20 mg by mouth daily.     Marland Kitchen glipiZIDE (GLUCOTROL XL) 10 MG 24 hr tablet Take 10 mg by mouth 2 (two) times daily with a meal.     . ibuprofen (ADVIL,MOTRIN) 600 MG tablet Take 1 tablet (600 mg total) by mouth every 6 (six) hours as needed for pain. 30 tablet 0  . Liraglutide (VICTOZA [place])  Inject 1.8 mg into the skin daily.     . metFORMIN (GLUCOPHAGE) 1000 MG tablet Take 1,000 mg by mouth 2 (two) times daily with a meal.    . simvastatin (ZOCOR) 20 MG tablet Take 20 mg by mouth every evening.    . valsartan (DIOVAN) 320 MG tablet Take 320 mg by mouth daily.    . Multiple Vitamins-Minerals (CENTRUM SILVER PO) Take 1 tablet by mouth daily.     No current facility-administered medications for this visit.    Functional Status:   In your present state of health, do you have any difficulty performing the following activities: 09/30/2015 02/16/2015  Hearing? N N  Vision? N N  Difficulty concentrating or making decisions? N N  Walking or climbing stairs? N N  Dressing or bathing? N N  Doing errands, shopping? N N    Fall/Depression Screening:    PHQ 2/9 Scores 09/30/2015  PHQ - 2 Score 0    Assessment:   [employer] employee and Link To Wellness member with Type II DM, HTN and hyperlipidemia, not meeting treatment target Hgb A1C, meeting other chronic disease treatment targets.  Plan:   [facility] CM Care Plan Problem One        Most Recent Value   Care Plan Problem One  patient with Type 2 DM, HTN and hyperlipidemia,  POC Hgb A1C= 9.0% , BP  and lipid panel meeting treatment targets   Role Documenting the Problem One  Care Management Corder for Problem One  Active   THN Long Term Goal (31-90 days)  Improved glycemic control as evidenced by A1C<9.0% at next check, BP checks normal and patient reports she is exercising at least twice weekly at next Link To Wellness visit   [place] Term Goal Start Date  09/30/15   Interventions for Problem One Long Term Goal  reviewed pathophysiologic metabolic defects in Type II DM using the Ominous Octet diagram, discussed strategies for weight loss, reviewed effect of exercise on insulin resistance, encouraged Toni [name] to set realistic exercise goals, reviewed CBG pre and post meal targets, encouraged Toni [name] to resume checking  blood sugars to help her with improved blood sugar control, will arrange for Link To Wellness follow up before the end of the year      RNCM to fax today's office visit note to Dr. Chapman Fitch. RNCM will meet with Toni [name] next month to assist with Type II DM self-management and assess patient's progress toward mutually set goals.  Barrington Ellison RN,CCM,CDE [place] Management Coordinator Link To Wellness Office Phone [phone] Office Fax [phone]

## 2015-10-03 ENCOUNTER — Encounter: Payer: 59 | Admitting: Interventional Cardiology

## 2015-10-26 ENCOUNTER — Other Ambulatory Visit: Payer: Self-pay | Admitting: *Deleted

## 2015-10-26 DIAGNOSIS — E119 Type 2 diabetes mellitus without complications: Secondary | ICD-10-CM

## 2015-10-26 NOTE — Patient Outreach (Signed)
Secure e-mail sent to [facility] e-mail address asking for dates and times that she can meet in December for Link To Wellness follow up. Barrington Ellison RN,CCM,CDE [place] Management Coordinator Link To Wellness Office Phone [phone] Office Fax [phone]

## 2015-11-04 ENCOUNTER — Ambulatory Visit: Payer: 59 | Admitting: *Deleted

## 2015-11-04 ENCOUNTER — Other Ambulatory Visit: Payer: Self-pay | Admitting: *Deleted

## 2015-11-04 VITALS — BP 122/80 | Wt 176.6 lb

## 2015-11-04 DIAGNOSIS — E119 Type 2 diabetes mellitus without complications: Secondary | ICD-10-CM

## 2015-11-08 NOTE — Patient Outreach (Signed)
[facility] [address]) Care Management   11/04/15  Toni [name] 27-Dec-1955 [id number]  Toni [name] is an 59 y.o. female who presents to the [place] Management office for routine Link To Wellness follow up for self management assistance with Type II DM, HTN and hyperlipidemia.  Subjective: Toni [name] says she is not motivated to exercise and diet in the midst of the holiday season and says she will initiate healthier behavior changes with the New Year. She says hs is not checking her blood sugars or exercising but she is taking her medications. She says she plans to join [employer] and start using her home treadmill in January. She will see Dr. Chapman Fitch on 12/28/15. She will also see Dr. Pernell Dupre for cardiology follow up on 01/07/16.  Objective:   Review of Systems  Constitutional: Negative.     Physical Exam  Constitutional: She is oriented to person, place, and time. She appears well-developed and well-nourished.  Respiratory: Effort normal.  Neurological: She is alert and oriented to person, place, and time.  Skin: Skin is warm and dry.  Psychiatric: She has a normal mood and affect. Her behavior is normal. Judgment and thought content normal.   Filed Weights   11/04/15 1526  Weight: 176 lb 9.6 oz (80.105 kg)   Filed Vitals:   11/04/15 1526  BP: 122/80  random POC CBG= 158 POC HGB A1C= 9.1%  Current Medications:   Current Outpatient Prescriptions  Medication Sig Dispense Refill  . carvedilol (COREG) 25 MG tablet Take 25 mg by mouth 2 (two) times daily with a meal.    . felodipine (PLENDIL) 2.5 MG 24 hr tablet Take 2.5 mg by mouth daily.    . furosemide (LASIX) 20 MG tablet Take 20 mg by mouth daily.     Marland Kitchen glipiZIDE (GLUCOTROL XL) 10 MG 24 hr tablet Take 10 mg by mouth 2 (two) times daily with a meal.     . HYDROcodone-acetaminophen (NORCO/VICODIN) 5-325 MG per tablet Take 1 tablet by mouth every 4 (four) hours as needed for pain.  (Patient not taking: Reported on 02/16/2015) 30 tablet 0  . ibuprofen (ADVIL,MOTRIN) 600 MG tablet Take 1 tablet (600 mg total) by mouth every 6 (six) hours as needed for pain. 30 tablet 0  . Liraglutide (VICTOZA [place]) Inject 1.8 mg into the skin daily.     . metFORMIN (GLUCOPHAGE) 1000 MG tablet Take 1,000 mg by mouth 2 (two) times daily with a meal.    . Multiple Vitamins-Minerals (CENTRUM SILVER PO) Take 1 tablet by mouth daily.    . ondansetron (ZOFRAN ODT) 4 MG disintegrating tablet 4mg  ODT q4 hours prn nausea/vomit (Patient not taking: Reported on 02/16/2015) 15 tablet 0  . simvastatin (ZOCOR) 20 MG tablet Take 20 mg by mouth every evening.    . tamsulosin (FLOMAX) 0.4 MG CAPS Take 1 capsule (0.4 mg total) by mouth daily. (Patient not taking: Reported on 02/16/2015) 15 capsule 0  . valsartan (DIOVAN) 320 MG tablet Take 320 mg by mouth daily.     No current facility-administered medications for this visit.    Functional Status:   In your present state of health, do you have any difficulty performing the following activities: 11/04/2015 09/30/2015  Hearing? N N  Vision? N N  Difficulty concentrating or making decisions? N N  Walking or climbing stairs? N N  Dressing or bathing? N N  Doing errands, shopping? N N    Fall/Depression Screening:  PHQ 2/9 Scores 09/30/2015  PHQ - 2 Score 0    Assessment:   [employer] employee and Link To Wellness member with Type II DM, HTN and hyperlipidemia currently meeting treatment targets for HTN and lipids but with elevated Hgb A1C   Plan:  [facility] CM Care Plan Problem One        Most Recent Value   Care Plan Problem One  Patient with Type 2 DM, HTN and hyperlipidemia,  POC Hgb A1C= 9.1% , BP and lipid panel (last done 03/14/15) meeting treatment targets   Role Documenting the Problem One  Care Management [place] for Problem One  Active   THN Long Term Goal (31-90 days)  Improved glycemic control as evidenced by A1C<9.0% at next  check, BP checks normal and patient reports she is exercising at least twice weekly at next Link To Wellness visit   [place] Term Goal Start Date  11/04/15   Interventions for Problem One Long Term Goal  reviewed essential self management skills necessary for good glycemic control including medication adherence, CHO controlled meal planning, CBG self monitoring, and regular exercise, reviewed effect of exercise on insulin resistance, encouraged [name] to set realistic exercise goals, reviewed CBG pre and post meal targets, discussed results of POC Hgb A1C and correlation to estimated average glucose, encouraged [name] to resume checking blood sugars to help her with improved blood sugar control, will arrange for Link To Wellness follow up after she sees Dr. Chapman Fitch on 12/28/15      Barrington Ellison RN,CCM,CDE [place] Management Coordinator Link To Wellness Office Phone [phone] Office Fax [phone]

## 2015-12-05 MED FILL — POTASSIUM CL ER 20 MEQ TAB: 20 | 90 days supply | Qty: 90 | Fill #2

## 2015-12-05 MED FILL — TRUEplus LANCETS 30G MISC: 50 days supply | Qty: 100 | Fill #1

## 2015-12-05 MED FILL — TRUE METRIX GLUCOSE TEST ST: 50 days supply | Qty: 100 | Fill #2

## 2015-12-06 MED FILL — SIMVASTATIN 20 MG TABLET: 20 | 90 days supply | Qty: 90 | Fill #0

## 2015-12-06 MED FILL — FELODIPINE ER 2.5 MG TABLET: 2.5 | 90 days supply | Qty: 90 | Fill #0

## 2015-12-07 MED FILL — VICTOZA 18 MG/3 ML INJECT P: 18 | 90 days supply | Qty: 27 | Fill #0

## 2015-12-26 ENCOUNTER — Encounter: Payer: Self-pay | Admitting: Interventional Cardiology

## 2015-12-28 MED FILL — metFORMIN HCL 1000 MG TABS: 1000 | 90 days supply | Qty: 180 | Fill #0

## 2016-01-03 DIAGNOSIS — E785 Hyperlipidemia, unspecified: Secondary | ICD-10-CM | POA: Insufficient documentation

## 2016-01-03 DIAGNOSIS — I119 Hypertensive heart disease without heart failure: Secondary | ICD-10-CM | POA: Insufficient documentation

## 2016-01-04 ENCOUNTER — Ambulatory Visit (INDEPENDENT_AMBULATORY_CARE_PROVIDER_SITE_OTHER): Payer: 59 | Admitting: Interventional Cardiology

## 2016-01-04 ENCOUNTER — Encounter: Payer: Self-pay | Admitting: Interventional Cardiology

## 2016-01-04 VITALS — BP 132/86 | HR 81 | Ht 66.0 in | Wt 171.1 lb

## 2016-01-04 DIAGNOSIS — I5022 Chronic systolic (congestive) heart failure: Secondary | ICD-10-CM | POA: Diagnosis not present

## 2016-01-04 DIAGNOSIS — R809 Proteinuria, unspecified: Secondary | ICD-10-CM | POA: Diagnosis not present

## 2016-01-04 DIAGNOSIS — K5900 Constipation, unspecified: Secondary | ICD-10-CM | POA: Diagnosis not present

## 2016-01-04 DIAGNOSIS — I429 Cardiomyopathy, unspecified: Secondary | ICD-10-CM | POA: Diagnosis not present

## 2016-01-04 DIAGNOSIS — I10 Essential (primary) hypertension: Secondary | ICD-10-CM | POA: Diagnosis not present

## 2016-01-04 DIAGNOSIS — K7581 Nonalcoholic steatohepatitis (NASH): Secondary | ICD-10-CM | POA: Diagnosis not present

## 2016-01-04 DIAGNOSIS — E118 Type 2 diabetes mellitus with unspecified complications: Secondary | ICD-10-CM | POA: Diagnosis not present

## 2016-01-04 DIAGNOSIS — I119 Hypertensive heart disease without heart failure: Secondary | ICD-10-CM

## 2016-01-04 DIAGNOSIS — I5023 Acute on chronic systolic (congestive) heart failure: Secondary | ICD-10-CM | POA: Insufficient documentation

## 2016-01-04 DIAGNOSIS — E785 Hyperlipidemia, unspecified: Secondary | ICD-10-CM | POA: Diagnosis not present

## 2016-01-04 DIAGNOSIS — E782 Mixed hyperlipidemia: Secondary | ICD-10-CM | POA: Diagnosis not present

## 2016-01-04 DIAGNOSIS — E1165 Type 2 diabetes mellitus with hyperglycemia: Secondary | ICD-10-CM | POA: Diagnosis not present

## 2016-01-04 DIAGNOSIS — E041 Nontoxic single thyroid nodule: Secondary | ICD-10-CM | POA: Diagnosis not present

## 2016-01-04 DIAGNOSIS — R3129 Other microscopic hematuria: Secondary | ICD-10-CM | POA: Diagnosis not present

## 2016-01-04 MED FILL — FARXIGA 5 MG TABLET: 5 | 30 days supply | Qty: 30 | Fill #0

## 2016-01-04 MED FILL — UNIFINE PENTIPS 32GX5/32: 32G X 4 MM | 90 days supply | Qty: 100 | Fill #0

## 2016-01-04 NOTE — Progress Notes (Signed)
Cardiology Office Note   Date:  01/04/2016   ID:  Toni [name], DOB Mar 11, 1956, MRN [id number]  PCP:  Antony Blackbird, MD  Cardiologist:  Sinclair Grooms, MD   Chief Complaint  Patient presents with  . Congestive Heart Failure      History of Present Illness: Toni [name] is a 60 y.o. female who presents for nonischemic cardiomyopathy initially diagnosed 1998, hypertension, diabetes, and hyperlipidemia.  Toni [name] is doing well. I'm not seen her over the last 5 years. She seems to be doing quite well.  She is on good medical therapy for left ventricular systolic dysfunction. Diabetes is under relatively good control. She denies chest pain. Is no peripheral edema. No exertional dyspnea or orthopnea.    Past Medical History  Diagnosis Date  . Diabetes mellitus without complication ([place])   . Hypertension   . CHF (congestive heart failure) ([place])   . Coronary artery disease     Past Surgical History  Procedure Laterality Date  . Abdominal hysterectomy    . Cardiac surgery       Current Outpatient Prescriptions  Medication Sig Dispense Refill  . carvedilol (COREG) 25 MG tablet Take 25 mg by mouth 2 (two) times daily with a meal.    . felodipine (PLENDIL) 2.5 MG 24 hr tablet Take 2.5 mg by mouth daily.    . furosemide (LASIX) 20 MG tablet Take 20 mg by mouth daily.     Marland Kitchen glipiZIDE (GLUCOTROL XL) 10 MG 24 hr tablet Take 10 mg by mouth 2 (two) times daily with a meal.     . Liraglutide (VICTOZA [place]) Inject 1.8 mg into the skin daily.     . metFORMIN (GLUCOPHAGE) 1000 MG tablet Take 1,000 mg by mouth 2 (two) times daily with a meal.    . Multiple Vitamins-Minerals (CENTRUM SILVER PO) Take 1 tablet by mouth daily.    . potassium chloride SA (K-DUR,KLOR-CON) 20 MEQ tablet Take 20 mEq by mouth daily.  5  . simvastatin (ZOCOR) 20 MG tablet Take 20 mg by mouth every evening.    . TRUE METRIX BLOOD GLUCOSE TEST test strip Use as directed.  5  . TRUEPLUS LANCETS 30G MISC Use as  directed.  5  . valsartan (DIOVAN) 320 MG tablet Take 320 mg by mouth daily.     No current facility-administered medications for this visit.    Allergies:   Biaxin and Vasotec    Social History:  The patient  reports that she has never smoked. She has never used smokeless tobacco. She reports that she drinks alcohol. She reports that she does not use illicit drugs.   Family History:  The patient's family history includes Benign prostatic hyperplasia in her brother; Diabetes Mellitus I in her brother, father, and mother; Hypertension in her brother and brother; Lung cancer in her mother.    ROS:  Please see the history of present illness.   Otherwise, review of systems are positive for tearing, nasal congestion, cough, all compatible with early a fever. Blood sugars not doing as well as she thinks he should..   All other systems are reviewed and negative.    PHYSICAL EXAM: VS:  BP 132/86 mmHg  Pulse 81  Ht 5\' 6"  (1.676 m)  Wt 171 lb 1.9 oz (77.62 kg)  BMI 27.63 kg/m2 , BMI Body mass index is 27.63 kg/(m^2). GEN: Well nourished, well developed, in no acute distress HEENT: normal Neck: no JVD, carotid bruits, or masses Cardiac:  RRR.  There is no murmur, rub, or gallop. There is no edema. Respiratory:  clear to auscultation bilaterally, normal work of breathing. GI: soft, nontender, nondistended, + BS MS: no deformity or atrophy Skin: warm and dry, no rash Neuro:  Strength and sensation are intact Psych: euthymic mood, full affect   EKG:  EKG is ordered today. The ekg reveals normal sinus rhythm with nonspecific T-wave flattening. Poor hallway progression V1 through V4.   Recent Labs: No results found for requested labs within last 365 days.    Lipid Panel No results found for: CHOL, TRIG, HDL, CHOLHDL, VLDL, LDLCALC, LDLDIRECT    Wt Readings from Last 3 Encounters:  01/04/16 171 lb 1.9 oz (77.62 kg)  11/04/15 176 lb 9.6 oz (80.105 kg)  02/16/15 180 lb 9.6 oz (81.92 kg)       Other studies Reviewed: Additional studies/ records that were reviewed today include: List of medical problems. History of dilated cardiomyopathy worked up with heart catheterization 1998 at [facility] no evidence of coronary disease. Blood pressure was poorly controlled. Most recent echo from 2012 was "within normal limits".. The findings include EF normal when last checked 2012..    ASSESSMENT AND PLAN:  1. Essential hypertension Very well controlled  2. Hyperlipidemia No recent lipid panel available  3. Cardiomegaly - hypertensive Cardiomyopathy either viral versus poorly controlled blood pressure with initial EF of 20% improving to normal by 2012.  4. Type 2 diabetes mellitus with complication, without long-term current use of insulin ([name]) Not well controlled with hemoglobin A1c 8.0. Creatinine 0.77    Current medicines are reviewed at length with the patient today.  The patient has the following concerns regarding medicines: We discussed the significance of ARB and beta blocker therapy with reference to maintaining normal left ventricular function..  The following changes/actions have been instituted:    2-D Doppler echocardiogram  Discussed the importance of medical therapy for maintaining systolic function  We discussed low-salt diet. We discussed aerobic activity.    Labs/ tests ordered today include:  No orders of the defined types were placed in this encounter.     Disposition:   FU with HS in 1 year  Signed, Sinclair Grooms, MD  01/04/2016 9:17 AM    [address], [place], [place]  [id number] Phone: [phone]; Fax: [phone]

## 2016-01-04 NOTE — Patient Instructions (Signed)
Medication Instructions:  Your physician recommends that you continue on your current medications as directed. Please refer to the Current Medication list given to you today.   Labwork: None ordered  Testing/Procedures: Your physician has requested that you have an echocardiogram. Echocardiography is a painless test that uses sound waves to create images of your heart. It provides your doctor with information about the size and shape of your heart and how well your heart's chambers and valves are working. This procedure takes approximately one hour. There are no restrictions for this procedure.    Follow-Up: Your physician wants you to follow-up in: 1 year with Dr.Smith You will receive a reminder letter in the mail two months in advance. If you don't receive a letter, please call our office to schedule the follow-up appointment.   Any Other Special Instructions Will Be Listed Below (If Applicable).     If you need a refill on your cardiac medications before your next appointment, please call your pharmacy.   

## 2016-01-09 ENCOUNTER — Encounter: Payer: Self-pay | Admitting: Interventional Cardiology

## 2016-01-09 MED FILL — FUROSEMIDE 20 MG TABLET: 20 | 90 days supply | Qty: 90 | Fill #1

## 2016-01-09 MED FILL — VALSARTAN 320 MG TABLET: 320 | 90 days supply | Qty: 90 | Fill #1

## 2016-01-10 MED FILL — CARVEDILOL 25 MG TABLET: 25 | 90 days supply | Qty: 360 | Fill #0

## 2016-01-26 DIAGNOSIS — R3129 Other microscopic hematuria: Secondary | ICD-10-CM | POA: Diagnosis not present

## 2016-01-26 MED FILL — glipiZIDE XL 10 MG TB24: 10 | 90 days supply | Qty: 180 | Fill #0

## 2016-01-31 MED FILL — FARXIGA 5 MG TABLET: 5 | 30 days supply | Qty: 30 | Fill #1

## 2016-02-10 ENCOUNTER — Other Ambulatory Visit: Payer: Self-pay | Admitting: *Deleted

## 2016-02-10 ENCOUNTER — Encounter: Payer: Self-pay | Admitting: *Deleted

## 2016-02-10 VITALS — BP 110/60 | Ht 65.5 in | Wt 164.4 lb

## 2016-02-10 DIAGNOSIS — E118 Type 2 diabetes mellitus with unspecified complications: Secondary | ICD-10-CM

## 2016-02-10 NOTE — Patient Outreach (Addendum)
Toni [name]   02/10/2016  Toni [name] [birth year] [id number]  Toni [name] is an 60 y.o. female who presents to the [place] [name] office for routine Link To Wellness follow up for self [name] assistance with Type II DM, HTN and hyperlipidemia.  Subjective: Toni [name] says she initiated healthier behavior changes after she saw Dr. Chapman Fitch in February and her Hgb A1C was 10.6%, the highest it has ever been. She says she checks her blood sugars 3 times weekly and has resumed exercising by walking on her home treadmill. She reports consistent medication adherence. She reports her fasting blood sugar variance as 90-140, and at hs 100-170.  She says Dr. Chapman Fitch prescribed Wilder Glade and after initial mild dizziness she is tolerating the medication without adverse side effects. She said she was told she had blood in her urine and needs to repeat a urine test and may need to see a urologist. She said her visit with  Dr. Pernell Dupre for cardiology follow up on 01/04/16 went well and she will have a follow up cardiac echo per his direction.  Objective:   Review of Systems  Constitutional: Negative.     Physical Exam  Constitutional: She is oriented to person, place, and time. She appears well-developed and well-nourished.  Respiratory: Effort normal.  Neurological: She is alert and oriented to person, place, and time.  Skin: Skin is warm and dry.  Psychiatric: She has a normal mood and affect. Her behavior is normal. Judgment and thought content normal.   Filed Vitals:   02/10/16 1419  BP: 110/60   Filed Weights   02/10/16 1419  Weight: 164 lb 6.4 oz (74.571 kg)    Current Medications:   Current Outpatient Prescriptions  Medication Sig Dispense Refill  . carvedilol (COREG) 25 MG tablet Take 25 mg by mouth 2 (two) times daily with a meal.    . dapagliflozin propanediol (FARXIGA) 5 MG TABS tablet Take 5 mg by mouth daily.    .  felodipine (PLENDIL) 2.5 MG 24 hr tablet Take 2.5 mg by mouth daily.    . furosemide (LASIX) 20 MG tablet Take 20 mg by mouth daily.     Marland Kitchen glipiZIDE (GLUCOTROL XL) 10 MG 24 hr tablet Take 10 mg by mouth 2 (two) times daily with a meal.     . Liraglutide (VICTOZA [place]) Inject 1.8 mg into the skin daily.     . metFORMIN (GLUCOPHAGE) 1000 MG tablet Take 1,000 mg by mouth 2 (two) times daily with a meal.    . potassium chloride SA (K-DUR,KLOR-CON) 20 MEQ tablet Take 20 mEq by mouth daily.  5  . simvastatin (ZOCOR) 20 MG tablet Take 20 mg by mouth every evening.    . valsartan (DIOVAN) 320 MG tablet Take 320 mg by mouth daily.    . Multiple Vitamins-Minerals (CENTRUM SILVER PO) Take 1 tablet by mouth daily. Reported on 02/10/2016    . TRUE METRIX BLOOD GLUCOSE TEST test strip Use as directed.  5  . TRUEPLUS LANCETS 30G MISC Use as directed.  5   No current facility-administered medications for this visit.    Functional Status:   In your present state of health, do you have any difficulty performing the following activities: 11/04/2015 09/30/2015  Hearing? N N  Vision? N N  Difficulty concentrating or making decisions? N N  Walking or climbing stairs? N N  Dressing or bathing? N N  Doing errands, shopping? N N  Fall/Depression Screening:    PHQ 2/9 Scores 09/30/2015  PHQ - 2 Score 0    Assessment:   [employer] employee and Link To Wellness member with Type II DM, HTN and hyperlipidemia currently meeting treatment targets for HTN and lipids but with elevated Hgb A1C of 10.6% on 01/04/16.  Plan:  [facility] CM Care Plan Problem One        Most Recent Value   Care Plan Problem One  Patient with Type 2 DM, HTN and hyperlipidemia with worsening glycemic control as evidenced by Hgb A1C= 10.6% on 01/04/16, was 9.1% on 11/04/15- now on Farxiga , HTN and lipids remain well controlled as evidenced by BP readings consistently <140/<90 and normal lipid panel (last done 01/04/16) meeting treatment targets    Role Documenting the Problem One  Care [name] [place] for Problem One  Active   THN Long Term Goal (31-90 days)  Improved glycemic control as evidenced by Hgb A1C<8.0% at next check, no weight gain, BP checks normal (<140/<90) and patient reports she continues to exercise at least twice weekly at next Link To Wellness visit   [place] Term Goal Start Date  02/10/16   Interventions for Problem One Long Term Goal  congratulated Toni [name] on her weight loss of 12 lbs and resuming exercise and CHO controlled meal planning, reviewed basic pathophysiology of Type II DM by using DeFronzo's Ominous Octet diagram, reviewed all medications and assessed adherence, discussed addition of Farxiga to her medication regimen and reviewed action, common side effects and Hgb A1C lowering capacity, and the need to stay hydrated to decrease episodes of hypotension, reviewed effect of exercise on insulin resistance, reviewed CBG readings and targets, discussed results of lab work drawn on 01/04/16 at MD office including lipid panel, Hgb A1C, microalbumin and u/a and encouraged Toni [name] to do follow up u/a to r/o ongoing hematuria and need for follow up for persistent hematuria, will arrange for Link To Wellness follow up in mid to late April to check Hgb A1C and assess her self [name] skills      RNCM to fax today's office visit note to Dr. Chapman Fitch. RNCM will meet at least quarterly and as needed with patient per Link To Wellness program guidelines to assist with Type II DM, HTN and hyperlipidemia self-[name] and assess patient's progress toward mutually set goals.  Barrington Ellison RN,CCM,CDE [place] [name] Coordinator Link To Wellness Office Phone [phone] Office Fax [phone]

## 2016-02-29 MED FILL — SIMVASTATIN 20 MG TABLET: 20 | 90 days supply | Qty: 90 | Fill #1

## 2016-02-29 MED FILL — FELODIPINE ER 2.5 MG TABLET: 2.5 | 90 days supply | Qty: 90 | Fill #1

## 2016-02-29 MED FILL — KLOR-CON M20 TABLET: 20 | 90 days supply | Qty: 90 | Fill #3

## 2016-03-01 MED FILL — FARXIGA 5 MG TABLET: 5 | 30 days supply | Qty: 30 | Fill #2

## 2016-03-09 ENCOUNTER — Other Ambulatory Visit: Payer: Self-pay | Admitting: *Deleted

## 2016-03-09 VITALS — BP 112/60 | Ht 65.5 in | Wt 166.0 lb

## 2016-03-09 DIAGNOSIS — E118 Type 2 diabetes mellitus with unspecified complications: Secondary | ICD-10-CM

## 2016-03-09 LAB — POCT GLYCOSYLATED HEMOGLOBIN (HGB A1C): Hemoglobin A1C: 8.3

## 2016-03-09 LAB — POCT CBG (FASTING - GLUCOSE)-MANUAL ENTRY: Glucose Fasting, POC: 114 mg/dL — AB (ref 70–99)

## 2016-03-09 MED FILL — VICTOZA 18 MG/3 ML INJECT P: 18 | 90 days supply | Qty: 27 | Fill #1

## 2016-03-11 NOTE — Patient Outreach (Signed)
[facility]) Care Management   03/09/16  Toni [name] [birth year] [id number]  Toni [name] is an 60 y.o. female who presents to the [place] Management office for routine Link To Wellness follow up for self management assistance with Type II DM, HTN and hyperlipidemia.Toni [name] is here for POC HG A1C as she has been exercising, following a CHO controlled meal plan and taking her medications as prescribed. She has been on [place] since mid February in addition to her other DM medications.   Subjective:  Toni [name] says she is tolerating the [place] without problems. She is hopeful that her Hgb A1C was improved as she says her home monitored CBGs are much improved and she is losing weight.   Objective:   Review of Systems  Constitutional: Negative.     Physical Exam  Constitutional: She is oriented to person, place, and time. She appears well-developed and well-nourished.  Respiratory: Effort normal.  Neurological: She is alert and oriented to person, place, and time.  Skin: Skin is warm and dry.  Psychiatric: She has a normal mood and affect. Her behavior is normal. Judgment and thought content normal.  . Filed Weights   03/09/16 1409  Weight: 166 lb (75.297 kg)   POC Hgb A1C= 8.3% POC 2 hour post meal CBG= 114  Encounter Medications:   Outpatient Encounter Prescriptions as of 03/09/2016  Medication Sig Note  . carvedilol (COREG) 25 MG tablet Take 25 mg by mouth 2 (two) times daily with a meal. 02/16/2015: Patient states she is taking 2 tablets twice daily  . dapagliflozin propanediol (FARXIGA) 5 MG TABS tablet Take 5 mg by mouth daily.   . felodipine (PLENDIL) 2.5 MG 24 hr tablet Take 2.5 mg by mouth daily. 02/10/2016: [facility]  . furosemide (LASIX) 20 MG tablet Take 20 mg by mouth daily.    Marland Kitchen glipiZIDE (GLUCOTROL XL) 10 MG 24 hr tablet Take 10 mg by mouth 2 (two) times daily with a meal.  02/16/2015: State taking 1 tablet twice day  .  Liraglutide (VICTOZA [place]) Inject 1.8 mg into the skin daily.  09/30/2015: Takes in evening  . metFORMIN (GLUCOPHAGE) 1000 MG tablet Take 1,000 mg by mouth 2 (two) times daily with a meal.   . Multiple Vitamins-Minerals (CENTRUM SILVER PO) Take 1 tablet by mouth daily. Reported on 02/10/2016 02/16/2015: "when I remember"  . potassium chloride SA (K-DUR,KLOR-CON) 20 MEQ tablet Take 20 mEq by mouth daily. 01/04/2016: Received from: External Pharmacy Received Sig:   . simvastatin (ZOCOR) 20 MG tablet Take 20 mg by mouth every evening.   . TRUE METRIX BLOOD GLUCOSE TEST test strip Use as directed. 01/04/2016: Received from: External Pharmacy Received Sig:   . TRUEPLUS LANCETS 30G MISC Use as directed. 01/04/2016: Received from: External Pharmacy Received Sig:   . valsartan (DIOVAN) 320 MG tablet Take 320 mg by mouth daily.    No facility-administered encounter medications on file as of 03/09/2016.    Functional Status:   In your present state of health, do you have any difficulty performing the following activities: 11/04/2015 09/30/2015  Hearing? N N  Vision? N N  Difficulty concentrating or making decisions? N N  Walking or climbing stairs? N N  Dressing or bathing? N N  Doing errands, shopping? N N    Fall/Depression Screening:    PHQ 2/9 Scores 09/30/2015  PHQ - 2 Score 0    Assessment:   [employer] employee with Type II DM, HTN  and hyperlipidemia with significantly improved Hgb A1C with addition of Farxiga to DM regimen and lifestyle changes.  Plan:  [facility] CM Care Plan Problem One        Most Recent Value   Care Plan Problem One  Patient with Type 2 DM, HTN and hyperlipidemia with improved glycemic control as evidenced by POC Hgb A1C= 8.3% today, previous Hgb A1C== 10.6% on 01/04/16, Farxiga x 2 months in addition to other DM medications , HTN and lipids remain well controlled as evidenced by BP readings consistently <140/<90 and normal lipid panel (last done 01/04/16) meeting treatment  targets   Role Documenting the Problem One  Care Management [place] for Problem One  Active   THN Long Term Goal (31-90 days)  Improved glycemic control as evidenced by Hgb A1C<7.5% at next check, no weight gain, BP checks normal (<140/<90) and patient reports she continues to exercise at least twice weekly at next Link To Wellness visit   [place] Term Goal Start Date  03/09/16   Interventions for Problem One Long Term Goal  congratulated [place] on her ongoing weight loss and resuming exercise and CHO controlled meal planning since mid February, reviewed basic pathophysiology of Type II DM by using DeFronzo's Ominous Octet diagram, reviewed all medications and assessed adherence, reviewed addition of Farxiga to her medication regimen and reviewed action, common side effects and Hgb A1C lowering capacity, and the need to stay hydrated to decrease episodes of hypotension, reviewed effect of exercise on insulin resistance, reviewed CBG readings and targets, again encouraged [place] to provide follow up u/a to r/o ongoing hematuria and need for follow up for persistent microhematuria, will arrange for Link To Wellness follow up 2 months to check POC Hgb A1C and assess her self management skills     RNCM to fax today's office visit note to Dr. Chapman Fitch. RNCM will meet quarterly and as needed with patient per Link To Wellness program guidelines to assist with Type II DM, HTN and hyperlipidemia self-management and assess patient's progress toward mutually set goals. Barrington Ellison RN,CCM,CDE [place] Management Coordinator Link To Wellness Office Phone [phone] Office Fax [phone]

## 2016-03-13 DIAGNOSIS — H5213 Myopia, bilateral: Secondary | ICD-10-CM | POA: Diagnosis not present

## 2016-03-13 DIAGNOSIS — H524 Presbyopia: Secondary | ICD-10-CM | POA: Diagnosis not present

## 2016-04-04 MED FILL — FARXIGA 5 MG TABLET: 5 | 30 days supply | Qty: 30 | Fill #3

## 2016-04-04 MED FILL — FUROSEMIDE 20 MG TABLET: 20 | 90 days supply | Qty: 90 | Fill #0

## 2016-04-04 MED FILL — metFORMIN HCL 1000 MG TABS: 1000 | 90 days supply | Qty: 180 | Fill #1

## 2016-04-04 MED FILL — CARVEDILOL 25 MG TABLET: 25 | 90 days supply | Qty: 360 | Fill #1

## 2016-04-04 MED FILL — VALSARTAN 320 MG TABLET: 320 | 90 days supply | Qty: 90 | Fill #0

## 2016-04-26 MED FILL — glipiZIDE XL 10 MG TB24: 10 | 90 days supply | Qty: 180 | Fill #1

## 2016-05-07 MED FILL — FARXIGA 5 MG TABLET: 5 | 30 days supply | Qty: 30 | Fill #4

## 2016-06-06 MED FILL — UNIFINE PENTIPS 32GX5/32: 32G X 4 MM | 90 days supply | Qty: 100 | Fill #1

## 2016-06-06 MED FILL — FARXIGA 5 MG TABLET: 5 | 30 days supply | Qty: 30 | Fill #5

## 2016-06-20 MED FILL — VICTOZA 18 MG/3 ML INJECT P: 18 | 90 days supply | Qty: 27 | Fill #2

## 2016-06-20 MED FILL — FELODIPINE ER 2.5 MG TABLET: 2.5 | 90 days supply | Qty: 90 | Fill #0

## 2016-06-20 MED FILL — KLOR-CON M20 TABLET: 20 | 90 days supply | Qty: 90 | Fill #0

## 2016-06-25 MED FILL — AMOXICILLIN 500 MG CAPSULE: 500 | 1 days supply | Qty: 4 | Fill #0

## 2016-06-27 MED FILL — IBUPROFEN 800 MG TABLET: 800 | 7 days supply | Qty: 21 | Fill #0

## 2016-07-06 MED FILL — FARXIGA 5 MG TABLET: 5 | 30 days supply | Qty: 30 | Fill #6

## 2016-07-09 MED FILL — VALSARTAN 320 MG TABLET: 320 | 90 days supply | Qty: 90 | Fill #0

## 2016-07-09 MED FILL — FUROSEMIDE 20 MG TABLET: 20 | 90 days supply | Qty: 90 | Fill #0

## 2016-07-09 MED FILL — SIMVASTATIN 20 MG TABLET: 20 | 90 days supply | Qty: 90 | Fill #0

## 2016-07-09 MED FILL — metFORMIN HCL 1000 MG TABS: 1000 | 90 days supply | Qty: 180 | Fill #0

## 2016-07-31 MED FILL — glipiZIDE XL 10 MG TB24: 10 | 90 days supply | Qty: 180 | Fill #0

## 2016-08-06 MED FILL — FARXIGA 5 MG TABLET: 5 | 30 days supply | Qty: 30 | Fill #7

## 2016-09-07 MED FILL — FARXIGA 5 MG TABLET: 5 | 30 days supply | Qty: 30 | Fill #8

## 2016-09-11 ENCOUNTER — Other Ambulatory Visit: Payer: Self-pay | Admitting: *Deleted

## 2016-09-11 DIAGNOSIS — E118 Type 2 diabetes mellitus with unspecified complications: Secondary | ICD-10-CM

## 2016-09-11 NOTE — Patient Outreach (Signed)
Secure e-mail sent to [facility] on 10/23 at 4:32 requesting she schedule a Link To Wellness follow up appointment.  Await response from [place].  Barrington Ellison RN,CCM,CDE [place] Management Coordinator Link To Wellness Office Phone [phone] Office Fax [phone]

## 2016-09-24 MED FILL — KLOR-CON M20 TABLET: 20 | 90 days supply | Qty: 90 | Fill #1

## 2016-09-24 MED FILL — FELODIPINE ER 2.5 MG TABLET: 2.5 | 90 days supply | Qty: 90 | Fill #1

## 2016-09-24 MED FILL — CARVEDILOL 25 MG TABLET: 25 | 90 days supply | Qty: 360 | Fill #2

## 2016-10-04 MED FILL — VALSARTAN 320 MG TABLET: 320 | 90 days supply | Qty: 90 | Fill #1

## 2016-10-04 MED FILL — FARXIGA 5 MG TABLET: 5 | 30 days supply | Qty: 30 | Fill #9

## 2016-10-04 MED FILL — VICTOZA 18 MG/3 ML INJECT P: 18 | 90 days supply | Qty: 27 | Fill #3

## 2016-10-08 MED FILL — FUROSEMIDE 20 MG TABLET: 20 | 90 days supply | Qty: 90 | Fill #0

## 2016-10-25 MED FILL — metFORMIN HCL 1000 MG TABS: 1000 | 90 days supply | Qty: 180 | Fill #1

## 2016-10-26 ENCOUNTER — Other Ambulatory Visit: Payer: Self-pay | Admitting: *Deleted

## 2016-10-26 DIAGNOSIS — Z7984 Long term (current) use of oral hypoglycemic drugs: Secondary | ICD-10-CM | POA: Diagnosis not present

## 2016-10-26 DIAGNOSIS — E785 Hyperlipidemia, unspecified: Secondary | ICD-10-CM | POA: Diagnosis not present

## 2016-10-26 DIAGNOSIS — E119 Type 2 diabetes mellitus without complications: Secondary | ICD-10-CM

## 2016-10-26 DIAGNOSIS — E1165 Type 2 diabetes mellitus with hyperglycemia: Secondary | ICD-10-CM | POA: Diagnosis not present

## 2016-10-26 DIAGNOSIS — I10 Essential (primary) hypertension: Secondary | ICD-10-CM | POA: Diagnosis not present

## 2016-10-26 DIAGNOSIS — Z79899 Other long term (current) drug therapy: Secondary | ICD-10-CM | POA: Diagnosis not present

## 2016-10-26 DIAGNOSIS — K5909 Other constipation: Secondary | ICD-10-CM | POA: Diagnosis not present

## 2016-10-26 DIAGNOSIS — E118 Type 2 diabetes mellitus with unspecified complications: Secondary | ICD-10-CM

## 2016-10-26 LAB — POCT GLYCOSYLATED HEMOGLOBIN (HGB A1C): Hemoglobin A1C: 7.6

## 2016-10-26 LAB — POCT CBG (FASTING - GLUCOSE)-MANUAL ENTRY: GLUCOSE FASTING, POC: 101 mg/dL — AB (ref 70–99)

## 2016-10-26 MED FILL — glipiZIDE XL 10 MG TB24: 10 | 90 days supply | Qty: 180 | Fill #0

## 2016-10-26 NOTE — Patient Outreach (Signed)
[facility]) Care Management   10/26/16  INDI VERRAN 1956/03/25 [id number]  FRANCISCO DOHRMANN is an 60 y.o. female who presents to the [place] Management office for routine Link To Wellness follow up for self management assistance with Type II DM, HTN and hyperlipidemia.   Subjective: Azarah states she just came from her primary care providers office and had lab work in addition to an office visit but did not have an Hgb A1C check so she is requesting one today. She says Dr. Chapman Fitch has left the [facility] practice so she saw Dr. Sheryn Bison. She says she has not been exercising or consistently  following a CHO controlled meal plan but she is  taking her medications as prescribed. She has been on [place] since mid February in addition to her other DM medications. Ercel says she is tolerating the [place] without problems.  Objective:   Review of Systems  Constitutional: Negative.     Physical Exam  Constitutional: She is oriented to person, place, and time. She appears well-developed and well-nourished.  Respiratory: Effort normal.  Neurological: She is alert and oriented to person, place, and time.  Skin: Skin is warm and dry.  Psychiatric: She has a normal mood and affect. Her behavior is normal. Judgment and thought content normal.  . There were no vitals filed for this visit.- No vitals were taken as Mateo had just come from her primary care provider's office POC Hgb A1C=7.6% POC fasting CBG= 101  Encounter Medications:   Outpatient Encounter Prescriptions as of 10/26/2016  Medication Sig Note  . carvedilol (COREG) 25 MG tablet Take 25 mg by mouth 2 (two) times daily with a meal. 02/16/2015: Patient states she is taking 2 tablets twice daily  . dapagliflozin propanediol (FARXIGA) 5 MG TABS tablet Take 5 mg by mouth daily.   . felodipine (PLENDIL) 2.5 MG 24 hr tablet Take 2.5 mg by mouth daily. 02/10/2016: [facility]  . furosemide  (LASIX) 20 MG tablet Take 20 mg by mouth daily.    Marland Kitchen glipiZIDE (GLUCOTROL XL) 10 MG 24 hr tablet Take 10 mg by mouth 2 (two) times daily with a meal.  02/16/2015: State taking 1 tablet twice day  . Liraglutide (VICTOZA [place]) Inject 1.8 mg into the skin daily.  09/30/2015: Takes in evening  . metFORMIN (GLUCOPHAGE) 1000 MG tablet Take 1,000 mg by mouth 2 (two) times daily with a meal.   . potassium chloride SA (K-DUR,KLOR-CON) 20 MEQ tablet Take 20 mEq by mouth daily. 01/04/2016: Received from: External Pharmacy Received Sig:   . simvastatin (ZOCOR) 20 MG tablet Take 20 mg by mouth every evening.   . valsartan (DIOVAN) 320 MG tablet Take 320 mg by mouth daily.   . Multiple Vitamins-Minerals (CENTRUM SILVER PO) Take 1 tablet by mouth daily. Reported on 02/10/2016 02/16/2015: "when I remember"  . TRUE METRIX BLOOD GLUCOSE TEST test strip Use as directed. 01/04/2016: Received from: External Pharmacy Received Sig:   . TRUEPLUS LANCETS 30G MISC Use as directed. 01/04/2016: Received from: External Pharmacy Received Sig:    No facility-administered encounter medications on file as of 10/26/2016.     Functional Status:   In your present state of health, do you have any difficulty performing the following activities: 10/26/2016 11/04/2015  Hearing? N N  Vision? N N  Difficulty concentrating or making decisions? N N  Walking or climbing stairs? N N  Dressing or bathing? N N  Doing errands, shopping? N  N  Some recent data might be hidden    Fall/Depression Screening:    PHQ 2/9 Scores 10/26/2016 09/30/2015  PHQ - 2 Score 0 0    Assessment:   [employer] employee with Type II DM, HTN and hyperlipidemia with ongoing improvement in glycemic control  Hgb A1C with addition of Farxiga to DM regimen and lifestyle changes.  Plan:  [facility] CM Care Plan Problem One        Most Recent Value   Care Plan Problem One  Patient with Type 2 DM, HTN and hyperlipidemia with improved glycemic control as evidenced by today's  POC Hgb A1C= 7.6%, previous Hgb A1C=8.3 % on 03/09/16, HTN and lipids remain well controlled as evidenced by BP readings consistently <140/<90 and normal lipid panel (last done 01/04/16) meeting treatment targets   Role Documenting the Problem One  Care Management [place] for Problem One  Active   [facility] Long Term Goal (31-90 days)  Improved glycemic control as evidenced by Hgb A1C<7.5% at next check, no weight gain, BP checks normal (<130/<80) and patient will be an active participant in the [facility] disease management program   [place] Term Goal Start Date  10/26/16   Interventions for Problem One Long Term Goal  Reviewed findings of her appointment with her primary care provider this morning, reviewed updated blood pressure treatment targets of <130/80 and provided copy of guidelines to Ivin Booty, ensured that Teva has a home blood pressure monitor and encouraged her to check her blood pressure once weekly to ensure she is meeting the new  treatment targets, reviewed all medications and assessed adherence, assessed POC fasting CBG and Hgb A1C and discussed results including correlation of Hgb A1C to estimated average glucose, discussed [facility] and ensured A[place]lie has enrolled, discussed how real time monitoring via clinical [facility] team will likely help Elsy with improved glycemic control and weight management,  advised Dwanna that ongoing disease management assistant will be provided via [place] to fax today's office visit note to Dr. Sheryn Bison.   Barrington Ellison RN,CCM,CDE [place] Management Coordinator Link To Wellness Office Phone [phone] Office Fax [phone]

## 2016-11-07 MED FILL — FARXIGA 5 MG TABLET: 5 | 30 days supply | Qty: 30 | Fill #10

## 2016-12-12 MED FILL — FARXIGA 5 MG TABLET: 5 | 30 days supply | Qty: 30 | Fill #11

## 2016-12-28 MED FILL — CARVEDILOL 25 MG TABLET: 25 | 90 days supply | Qty: 360 | Fill #3

## 2016-12-28 MED FILL — SIMVASTATIN 20 MG TABLET: 20 | 90 days supply | Qty: 90 | Fill #1

## 2016-12-28 MED FILL — KLOR-CON M20 TABLET: 20 | 90 days supply | Qty: 90 | Fill #2

## 2017-01-02 MED FILL — VALSARTAN 320 MG TABLET: 320 | 90 days supply | Qty: 90 | Fill #0

## 2017-01-02 MED FILL — FELODIPINE ER 2.5 MG TABLET: 2.5 | 90 days supply | Qty: 90 | Fill #0

## 2017-01-02 MED FILL — FUROSEMIDE 20 MG TABLET: 20 | 90 days supply | Qty: 90 | Fill #0

## 2017-01-08 MED FILL — AMOXICILLIN 500 MG CAPSULE: 500 | 7 days supply | Qty: 21 | Fill #0

## 2017-01-08 MED FILL — ACETAMINOPHEN/COD #3 TABLET: 300-30 | 3 days supply | Qty: 16 | Fill #0

## 2017-01-08 MED FILL — IBUPROFEN 600 MG TABLET: 600 | 6 days supply | Qty: 30 | Fill #0

## 2017-01-15 MED FILL — UNIFINE PENTIPS 32GX5/32": 32G X 4 MM | 90 days supply | Qty: 100 | Fill #0

## 2017-01-15 MED FILL — VICTOZA 18 MG/3 ML INJECT P: 18 | 90 days supply | Qty: 27 | Fill #0

## 2017-01-15 MED FILL — UNIFINE PENTIPS 32GX5/32: 32G X 4 MM | 90 days supply | Qty: 100 | Fill #0

## 2017-01-15 MED FILL — FARXIGA 5 MG TABLET: 5 | 90 days supply | Qty: 90 | Fill #0

## 2017-01-19 ENCOUNTER — Ambulatory Visit (HOSPITAL_COMMUNITY)
Admission: EM | Admit: 2017-01-19 | Discharge: 2017-01-19 | Disposition: A | Discharge status: Home / Self Care | Payer: 59 | Attending: Family Medicine | Admitting: Family Medicine

## 2017-01-19 ENCOUNTER — Encounter (HOSPITAL_COMMUNITY): Payer: Self-pay | Admitting: *Deleted

## 2017-01-19 DIAGNOSIS — H1033 Unspecified acute conjunctivitis, bilateral: Secondary | ICD-10-CM | POA: Diagnosis not present

## 2017-01-19 DIAGNOSIS — J209 Acute bronchitis, unspecified: Secondary | ICD-10-CM | POA: Diagnosis not present

## 2017-01-19 MED ORDER — TOBRAMYCIN 0.3 % OP SOLN: 1.0000 [drp] | OPHTHALMIC | 0 refills | Status: DC

## 2017-01-19 MED ORDER — BENZONATATE 100 MG PO CAPS: 100.0000 mg | ORAL_CAPSULE | Freq: Three times a day (TID) | ORAL | 0 refills | Status: DC | PRN

## 2017-01-19 NOTE — ED Provider Notes (Signed)
[place]    CSN: [id number] Arrival date & time: 01/19/17  1203     History   Chief Complaint Chief Complaint  Patient presents with  . Eye Drainage    HPI Toni [name] is a 61 y.o. female.   HPI  Patient presents with a four-day history of upper resp symptoms including productive cough, runny nose, nasal congestion, sinus pressure, and sore throat. She also has noticed over the past 2 days that her eyes have become very red, itchy, and with drainage. She is a contact lens wearer. She denies any pain in her eyes or vision changes. She denies any fevers, shaking, shortness of breath, muscle aches, ear pain/drainage. She has been using Delsym and Mucinex at home. This is been slightly helpful with her cough. She has also used ibuprofen which has essentially resolved her sore throat.  Past Medical History:  Diagnosis Date  . CHF (congestive heart failure) ([place])   . Coronary artery disease   . Diabetes mellitus without complication ([place])   . Hypertension     Patient Active Problem List   Diagnosis Date Noted  . Chronic systolic heart failure ([place]) 01/04/2016  . Hyperlipidemia 01/03/2016  . Cardiomegaly - hypertensive 01/03/2016  . DM (diabetes mellitus), type 2 with complications ([place]) 123456  . Hypertension 01/03/2014    Past Surgical History:  Procedure Laterality Date  . ABDOMINAL HYSTERECTOMY    . CARDIAC SURGERY      Home Medications    Prior to Admission medications   Medication Sig Start Date End Date Taking? Authorizing Provider  benzonatate (TESSALON) 100 MG capsule Take 1 capsule (100 mg total) by mouth 3 (three) times daily as needed for cough. 01/19/17   Shelda Pal, DO  carvedilol (COREG) 25 MG tablet Take 25 mg by mouth 2 (two) times daily with a meal.    Historical Provider, MD  dapagliflozin propanediol (FARXIGA) 5 MG TABS tablet Take 5 mg by mouth daily.    Historical Provider, MD  felodipine (PLENDIL) 2.5 MG 24 hr tablet  Take 2.5 mg by mouth daily.    Historical Provider, MD  furosemide (LASIX) 20 MG tablet Take 20 mg by mouth daily.     Historical Provider, MD  glipiZIDE (GLUCOTROL XL) 10 MG 24 hr tablet Take 10 mg by mouth 2 (two) times daily with a meal.     Historical Provider, MD  Liraglutide (VICTOZA [place]) Inject 1.8 mg into the skin daily.     Historical Provider, MD  metFORMIN (GLUCOPHAGE) 1000 MG tablet Take 1,000 mg by mouth 2 (two) times daily with a meal.    Historical Provider, MD  Multiple Vitamins-Minerals (CENTRUM SILVER PO) Take 1 tablet by mouth daily. Reported on 02/10/2016    Historical Provider, MD  potassium chloride SA (K-DUR,KLOR-CON) 20 MEQ tablet Take 20 mEq by mouth daily. 12/05/15   Historical Provider, MD  simvastatin (ZOCOR) 20 MG tablet Take 20 mg by mouth every evening.    Historical Provider, MD  tobramycin (TOBREX) 0.3 % ophthalmic solution Place 1 drop into both eyes every 4 (four) hours. 01/19/17   Shelda Pal, DO  TRUE METRIX BLOOD GLUCOSE TEST test strip Use as directed. 12/05/15   Historical Provider, MD  TRUEPLUS LANCETS 30G MISC Use as directed. 12/05/15   Historical Provider, MD  valsartan (DIOVAN) 320 MG tablet Take 320 mg by mouth daily.    Historical Provider, MD    Family History Family History  Problem Relation Age of  Onset  . Diabetes Mellitus I Mother   . Lung cancer Mother   . Diabetes Mellitus I Father   . Hypertension Brother   . Hypertension Brother   . Diabetes Mellitus I Brother   . Benign prostatic hyperplasia Brother     Social History Social History  Substance Use Topics  . Smoking status: Never Smoker  . Smokeless tobacco: Never Used  . Alcohol use 0.0 oz/week     Comment: less than once a month     Allergies   Biaxin [clarithromycin] and Vasotec [enalapril]   Review of Systems Review of Systems  Constitutional: Negative for fatigue.  Respiratory: Negative for shortness of breath.      Physical Exam Triage Vital Signs ED  Triage Vitals [01/19/17 1221]  Enc Vitals Group     BP 150/88     Pulse Rate 80     Resp 18     Temp 99.2 F (37.3 C)     Temp Source Oral     SpO2 100 %   Updated Vital Signs BP 150/88 (BP Location: Right Arm)   Pulse 80   Temp 99.2 F (37.3 C) (Oral)   Resp 18   SpO2 100%     Physical Exam  Constitutional: She appears well-developed and well-nourished.  HENT:  Head: Normocephalic and atraumatic.  Right Ear: External ear normal.  Left Ear: External ear normal.  Nose: Nose normal.  Mouth/Throat: Oropharynx is clear and moist. No oropharyngeal exudate.  Neck: Neck supple. No thyromegaly present.  Cardiovascular: Regular rhythm.   Pulmonary/Chest: Effort normal and breath sounds normal.  Skin: She is not diaphoretic.  Psychiatric: She has a normal mood and affect. Judgment normal.  Eyes: Sclera and conjunctiva injected. EOMi, discharge noted from L eye, +lacrimation   UC Treatments / Results  Procedures Procedures - none  Initial Impression / Assessment and Plan / UC Course  I have reviewed the triage vital signs and the nursing notes.  Pertinent labs & imaging results that were available during my care of the patient were reviewed by me and considered in my medical decision making (see chart for details).     61 year old female presents with upper respiratory infection and subsequent conjunctivitis. Will cover for bacterial etiology given discharge. Recommend artificial tears as needed. Continue practicing good hand hygiene, pushing fluids, and covering mouth. URI symptoms likely viral in nature, treat supportive. She starts developing fevers, shaking, shortness breath, or overall worsening of symptoms, seek immediate care. Follow-up with PCP this coming week for her elevated blood pressure. The patient voiced understanding and agreement with the plan.  Final Clinical Impressions(s) / UC Diagnoses   Final diagnoses:  Acute conjunctivitis of both eyes, unspecified  acute conjunctivitis type  Acute bronchitis, unspecified organism    New Prescriptions New Prescriptions   BENZONATATE (TESSALON) 100 MG CAPSULE    Take 1 capsule (100 mg total) by mouth 3 (three) times daily as needed for cough.   TOBRAMYCIN (TOBREX) 0.3 % OPHTHALMIC SOLUTION    Place 1 drop into both eyes every 4 (four) hours.     [place], DO 01/19/17 1250

## 2017-01-19 NOTE — Discharge Instructions (Signed)
Continue to push fluids, practice good hand hygiene, and cover your mouth if you cough.  If you start having fevers, shaking or shortness of breath, seek immediate care, likely at the ER.  Keep using your home medications if they are working.   Try to avoid touching your face/hands as much as possible.

## 2017-01-19 NOTE — ED Triage Notes (Signed)
Pt reports   Cough   And    sorethroat     X  6  Days       Also  Reports   Eyes  Became  Red   And  Draining

## 2017-01-30 MED FILL — metFORMIN HCL 1000 MG TABS: 1000 | 90 days supply | Qty: 180 | Fill #0

## 2017-01-30 MED FILL — glipiZIDE XL 10 MG TB24: 10 | 90 days supply | Qty: 180 | Fill #0

## 2017-02-08 DIAGNOSIS — B354 Tinea corporis: Secondary | ICD-10-CM | POA: Diagnosis not present

## 2017-02-08 DIAGNOSIS — J209 Acute bronchitis, unspecified: Secondary | ICD-10-CM | POA: Diagnosis not present

## 2017-02-08 MED FILL — AZITHROMYCIN 250 MG TABLET: 250 | 5 days supply | Qty: 6 | Fill #0

## 2017-02-08 MED FILL — BENZONATATE 100 MG CAP: 100 | 10 days supply | Qty: 30 | Fill #0

## 2017-03-29 MED FILL — POTASSIUM CL ER 20 MEQ TABL: 20 | 90 days supply | Qty: 90 | Fill #3

## 2017-03-29 MED FILL — FELODIPINE ER 2.5 MG TABLET: 2.5 | 90 days supply | Qty: 90 | Fill #0

## 2017-03-29 MED FILL — CARVEDILOL 25 MG TABLET: 25 | 60 days supply | Qty: 240 | Fill #0

## 2017-03-29 MED FILL — FUROSEMIDE 20 MG TABLET: 20 | 90 days supply | Qty: 90 | Fill #0

## 2017-04-23 MED FILL — FARXIGA 5 MG TABLET: 5 | 90 days supply | Qty: 90 | Fill #1

## 2017-04-29 MED FILL — glipiZIDE ER 10 MG TB24: 10 | 90 days supply | Qty: 180 | Fill #1

## 2017-04-29 MED FILL — metFORMIN HCL 1000 MG TABS: 1000 | 90 days supply | Qty: 180 | Fill #1

## 2017-05-09 MED FILL — VICTOZA 18 MG/3 ML INJECT P: 18 | 90 days supply | Qty: 27 | Fill #1

## 2017-05-10 MED FILL — VALSARTAN 320 MG TABLET: 320 | 90 days supply | Qty: 90 | Fill #0

## 2017-05-13 MED FILL — ACCU-CHEK FASTCLIX LANCETS: 90 days supply | Qty: 204 | Fill #0

## 2017-05-13 MED FILL — ACCU-CHEK GUIDE TEST STRIP: 90 days supply | Qty: 200 | Fill #0

## 2017-07-03 MED FILL — FELODIPINE ER 2.5 MG TABLET: 2.5 | 90 days supply | Qty: 90 | Fill #1

## 2017-07-03 MED FILL — SIMVASTATIN 20 MG TABLET: 20 | 90 days supply | Qty: 90 | Fill #0

## 2017-07-03 MED FILL — POTASSIUM CL ER 20 MEQ TABL: 20 | 90 days supply | Qty: 90 | Fill #0

## 2017-07-03 MED FILL — CARVEDILOL 25 MG TABLET: 25 | 60 days supply | Qty: 240 | Fill #1

## 2017-07-03 MED FILL — FUROSEMIDE 20 MG TABLET: 20 | 90 days supply | Qty: 90 | Fill #1

## 2017-07-19 DIAGNOSIS — H5213 Myopia, bilateral: Secondary | ICD-10-CM | POA: Diagnosis not present

## 2017-07-25 MED FILL — UNIFINE PENTIPS 32GX5/32: 32G X 4 MM | 90 days supply | Qty: 100 | Fill #1

## 2017-07-25 MED FILL — glipiZIDE ER 10 MG TB24: 10 | 90 days supply | Qty: 180 | Fill #1

## 2017-07-25 MED FILL — UNIFINE PENTIPS 32GX5/32": 32G X 4 MM | 90 days supply | Qty: 100 | Fill #1

## 2017-08-01 MED FILL — FARXIGA 5 MG TABLET: 5 | 90 days supply | Qty: 90 | Fill #0

## 2017-08-14 MED FILL — metFORMIN HCL 1000 MG TABS: 1000 | 90 days supply | Qty: 180 | Fill #0

## 2017-08-22 MED FILL — VALSARTAN 320 MG TABLET: 320 | 90 days supply | Qty: 90 | Fill #0

## 2017-09-23 DIAGNOSIS — E785 Hyperlipidemia, unspecified: Secondary | ICD-10-CM | POA: Diagnosis not present

## 2017-09-23 DIAGNOSIS — K5909 Other constipation: Secondary | ICD-10-CM | POA: Diagnosis not present

## 2017-09-23 DIAGNOSIS — Z6827 Body mass index (BMI) 27.0-27.9, adult: Secondary | ICD-10-CM | POA: Diagnosis not present

## 2017-09-23 DIAGNOSIS — E1165 Type 2 diabetes mellitus with hyperglycemia: Secondary | ICD-10-CM | POA: Diagnosis not present

## 2017-09-23 DIAGNOSIS — I10 Essential (primary) hypertension: Secondary | ICD-10-CM | POA: Diagnosis not present

## 2017-09-23 DIAGNOSIS — E119 Type 2 diabetes mellitus without complications: Secondary | ICD-10-CM | POA: Diagnosis not present

## 2017-09-23 DIAGNOSIS — Z7689 Persons encountering health services in other specified circumstances: Secondary | ICD-10-CM | POA: Diagnosis not present

## 2017-09-23 DIAGNOSIS — E663 Overweight: Secondary | ICD-10-CM | POA: Diagnosis not present

## 2017-09-23 DIAGNOSIS — Z79899 Other long term (current) drug therapy: Secondary | ICD-10-CM | POA: Diagnosis not present

## 2017-09-24 MED FILL — SIMVASTATIN 20 MG TABLET: 20 | 90 days supply | Qty: 90 | Fill #0

## 2017-09-24 MED FILL — VICTOZA 18 MG/3 ML INJECT P: 18 | 90 days supply | Qty: 27 | Fill #0

## 2017-09-24 MED FILL — CARVEDILOL 25 MG TABLET: 25 | 90 days supply | Qty: 360 | Fill #0

## 2017-09-24 MED FILL — FUROSEMIDE 20 MG TABLET: 20 | 90 days supply | Qty: 90 | Fill #0

## 2017-10-07 MED FILL — LINZESS 72 MCG CAPSULE: 72 | 30 days supply | Qty: 30 | Fill #0

## 2017-10-18 ENCOUNTER — Other Ambulatory Visit: Payer: Self-pay | Admitting: *Deleted

## 2017-10-18 NOTE — Patient Outreach (Signed)
Toni [name] transitioned from the [employer] To Wellness program to the [employer] on 11/27/16 for Type II diabetes self-management assistance so will close case to the diabetes Link To Wellness program due to delegation of disease management services to [employer] from [employer] for [place] members in 2019. Barrington Ellison RN,CCM,CDE [place] Management Coordinator Link To Wellness and [employer] [phone] Office Fax [phone]

## 2017-10-23 MED FILL — FELODIPINE ER 2.5 MG TB24: 2.5 | 90 days supply | Qty: 90 | Fill #2

## 2017-10-23 MED FILL — FARXIGA 10 MG TABLET: 10 | 90 days supply | Qty: 90 | Fill #0

## 2017-10-23 MED FILL — glipiZIDE ER 10 MG TB24: 10 | 90 days supply | Qty: 180 | Fill #2

## 2017-10-24 MED FILL — POTASSIUM CL ER 20 MEQ TABL: 20 | 90 days supply | Qty: 90 | Fill #0

## 2017-11-18 MED FILL — metFORMIN HCL 1000 MG TABS: 1000 | 90 days supply | Qty: 180 | Fill #0

## 2017-12-11 MED FILL — VALSARTAN 320 MG TABS: 320 | 90 days supply | Qty: 90 | Fill #0

## 2017-12-16 MED FILL — LINZESS 145 MCG CAPSULE: 145 | 30 days supply | Qty: 30 | Fill #0

## 2017-12-24 DIAGNOSIS — I10 Essential (primary) hypertension: Secondary | ICD-10-CM | POA: Diagnosis not present

## 2017-12-24 DIAGNOSIS — E785 Hyperlipidemia, unspecified: Secondary | ICD-10-CM | POA: Diagnosis not present

## 2017-12-24 DIAGNOSIS — E1129 Type 2 diabetes mellitus with other diabetic kidney complication: Secondary | ICD-10-CM | POA: Diagnosis not present

## 2017-12-24 DIAGNOSIS — Z6828 Body mass index (BMI) 28.0-28.9, adult: Secondary | ICD-10-CM | POA: Diagnosis not present

## 2017-12-24 DIAGNOSIS — Z7984 Long term (current) use of oral hypoglycemic drugs: Secondary | ICD-10-CM | POA: Diagnosis not present

## 2017-12-24 DIAGNOSIS — E1165 Type 2 diabetes mellitus with hyperglycemia: Secondary | ICD-10-CM | POA: Diagnosis not present

## 2017-12-24 DIAGNOSIS — R809 Proteinuria, unspecified: Secondary | ICD-10-CM | POA: Diagnosis not present

## 2017-12-24 DIAGNOSIS — E663 Overweight: Secondary | ICD-10-CM | POA: Diagnosis not present

## 2018-01-17 MED FILL — FARXIGA 10 MG TABLET: 10 | 90 days supply | Qty: 90 | Fill #1

## 2018-01-17 MED FILL — VICTOZA 18 MG/3 ML INJECT P: 18 | 90 days supply | Qty: 27 | Fill #1

## 2018-01-17 MED FILL — CARVEDILOL 25 MG TABLET: 25 | 90 days supply | Qty: 360 | Fill #1

## 2018-01-17 MED FILL — FUROSEMIDE 20 MG TABS: 20 | 90 days supply | Qty: 90 | Fill #1

## 2018-01-17 MED FILL — SIMVASTATIN 20 MG TABLET: 20 | 90 days supply | Qty: 90 | Fill #1

## 2018-01-29 MED FILL — POTASSIUM CL ER 20 MEQ TABL: 20 | 90 days supply | Qty: 90 | Fill #0

## 2018-01-29 MED FILL — glipiZIDE ER 10 MG TB24: 10 | 90 days supply | Qty: 180 | Fill #0

## 2018-02-20 MED FILL — FELODIPINE ER 2.5 MG TB24: 2.5 | 90 days supply | Qty: 90 | Fill #0

## 2018-02-26 MED FILL — metFORMIN HCL 1000 MG TABS: 1000 | 90 days supply | Qty: 180 | Fill #1

## 2018-03-13 MED FILL — UNIFINE PENTIPS 32GX5/32: 32G X 4 MM | 90 days supply | Qty: 100 | Fill #0

## 2018-03-13 MED FILL — UNIFINE PENTIPS 32GX5/32": 32G X 4 MM | 90 days supply | Qty: 100 | Fill #0

## 2018-03-13 MED FILL — ACCU-CHEK FASTCLIX LANCETS: 90 days supply | Qty: 204 | Fill #1

## 2018-03-13 MED FILL — VALSARTAN 320 MG TABLET: 320 | 90 days supply | Qty: 90 | Fill #1

## 2018-03-13 MED FILL — ACCU-CHEK GUIDE STRP: 90 days supply | Qty: 200 | Fill #1

## 2018-03-24 MED FILL — LINZESS 145 MCG CAPSULE: 145 | 30 days supply | Qty: 30 | Fill #1

## 2018-03-26 DIAGNOSIS — N6002 Solitary cyst of left breast: Secondary | ICD-10-CM | POA: Diagnosis not present

## 2018-03-26 DIAGNOSIS — E1129 Type 2 diabetes mellitus with other diabetic kidney complication: Secondary | ICD-10-CM | POA: Diagnosis not present

## 2018-03-26 DIAGNOSIS — I10 Essential (primary) hypertension: Secondary | ICD-10-CM | POA: Diagnosis not present

## 2018-03-26 DIAGNOSIS — E785 Hyperlipidemia, unspecified: Secondary | ICD-10-CM | POA: Diagnosis not present

## 2018-03-26 DIAGNOSIS — Z01419 Encounter for gynecological examination (general) (routine) without abnormal findings: Secondary | ICD-10-CM | POA: Diagnosis not present

## 2018-03-26 DIAGNOSIS — Z7984 Long term (current) use of oral hypoglycemic drugs: Secondary | ICD-10-CM | POA: Diagnosis not present

## 2018-03-26 DIAGNOSIS — Z1211 Encounter for screening for malignant neoplasm of colon: Secondary | ICD-10-CM | POA: Diagnosis not present

## 2018-03-26 DIAGNOSIS — Z23 Encounter for immunization: Secondary | ICD-10-CM | POA: Diagnosis not present

## 2018-03-26 DIAGNOSIS — E1165 Type 2 diabetes mellitus with hyperglycemia: Secondary | ICD-10-CM | POA: Diagnosis not present

## 2018-03-26 DIAGNOSIS — Z1231 Encounter for screening mammogram for malignant neoplasm of breast: Secondary | ICD-10-CM | POA: Diagnosis not present

## 2018-03-31 ENCOUNTER — Other Ambulatory Visit: Payer: Self-pay | Admitting: Family Medicine

## 2018-03-31 DIAGNOSIS — N6002 Solitary cyst of left breast: Secondary | ICD-10-CM

## 2018-04-21 MED FILL — CARVEDILOL 25 MG TABLET: 25 | 90 days supply | Qty: 360 | Fill #2

## 2018-04-21 MED FILL — glipiZIDE ER 10 MG TB24: 10 | 90 days supply | Qty: 180 | Fill #1

## 2018-04-21 MED FILL — SIMVASTATIN 20 MG TABLET: 20 | 90 days supply | Qty: 90 | Fill #0

## 2018-04-21 MED FILL — FUROSEMIDE 20 MG TABS: 20 | 90 days supply | Qty: 90 | Fill #0

## 2018-04-22 ENCOUNTER — Ambulatory Visit
Admission: RE | Admit: 2018-04-22 | Discharge: 2018-04-22 | Disposition: A | Discharge status: Home / Self Care | Payer: 59 | Source: Ambulatory Visit | Attending: Family Medicine | Admitting: Family Medicine

## 2018-04-22 DIAGNOSIS — N6320 Unspecified lump in the left breast, unspecified quadrant: Secondary | ICD-10-CM | POA: Diagnosis not present

## 2018-04-22 DIAGNOSIS — N6002 Solitary cyst of left breast: Secondary | ICD-10-CM

## 2018-04-22 DIAGNOSIS — R922 Inconclusive mammogram: Secondary | ICD-10-CM | POA: Diagnosis not present

## 2018-04-22 MED FILL — AMOXICILLIN 500 MG CAPSULE: 500 | 7 days supply | Qty: 21 | Fill #0

## 2018-05-05 MED FILL — VICTOZA 18 MG/3 ML INJECT P: 18 | 90 days supply | Qty: 27 | Fill #2

## 2018-05-19 MED FILL — FARXIGA 10 MG TABLET: 10 | 90 days supply | Qty: 90 | Fill #2

## 2018-05-19 MED FILL — metFORMIN HCL 1000 MG TABS: 1000 | 90 days supply | Qty: 180 | Fill #2

## 2018-05-19 MED FILL — LINZESS 145 MCG CAPSULE: 145 | 30 days supply | Qty: 30 | Fill #0

## 2018-05-19 MED FILL — POTASSIUM CL ER 20 MEQ TABL: 20 | 90 days supply | Qty: 90 | Fill #0

## 2018-05-19 MED FILL — FELODIPINE ER 2.5 MG TB24: 2.5 | 90 days supply | Qty: 90 | Fill #0

## 2018-06-24 DIAGNOSIS — I10 Essential (primary) hypertension: Secondary | ICD-10-CM | POA: Diagnosis not present

## 2018-06-24 DIAGNOSIS — E1169 Type 2 diabetes mellitus with other specified complication: Secondary | ICD-10-CM | POA: Diagnosis not present

## 2018-06-24 DIAGNOSIS — E785 Hyperlipidemia, unspecified: Secondary | ICD-10-CM | POA: Diagnosis not present

## 2018-07-11 MED FILL — VALSARTAN 320 MG TAB: 320 | 30 days supply | Qty: 90 | Fill #0

## 2018-07-24 MED FILL — glipiZIDE ER 10 MG TB24: 10 | 90 days supply | Qty: 180 | Fill #2

## 2018-07-25 MED FILL — FUROSEMIDE 20 MG TABS: 20 | 90 days supply | Qty: 90 | Fill #0

## 2018-07-25 MED FILL — CARVEDILOL 25 MG TABLET: 25 | 90 days supply | Qty: 360 | Fill #0

## 2018-08-27 MED FILL — LINZESS 145 MCG CAPSULE: 145 | 30 days supply | Qty: 30 | Fill #1

## 2018-08-27 MED FILL — POTASSIUM CL ER 20 MEQ TAB: 20 | 90 days supply | Qty: 90 | Fill #0

## 2018-08-28 MED FILL — FELODIPINE ER 2.5 MG TB24: 2.5 | 90 days supply | Qty: 90 | Fill #0

## 2018-08-28 MED FILL — FARXIGA 10 MG TABLET: 10 | 90 days supply | Qty: 90 | Fill #0

## 2018-08-28 MED FILL — VICTOZA 18 MG/3 ML INJECT P: 18 | 90 days supply | Qty: 27 | Fill #0

## 2018-08-28 MED FILL — metFORMIN HCL 1000 MG TABS: 1000 | 90 days supply | Qty: 180 | Fill #0

## 2018-09-11 DIAGNOSIS — H5213 Myopia, bilateral: Secondary | ICD-10-CM | POA: Diagnosis not present

## 2018-10-21 MED FILL — glipiZIDE ER 10 MG TB24: 10 | 90 days supply | Qty: 180 | Fill #0

## 2018-10-21 MED FILL — FUROSEMIDE 20 MG TABS: 20 | 90 days supply | Qty: 90 | Fill #1

## 2018-10-21 MED FILL — VALSARTAN 320 MG TAB: 320 | 90 days supply | Qty: 90 | Fill #1

## 2018-11-13 ENCOUNTER — Other Ambulatory Visit: Payer: Self-pay

## 2018-11-13 ENCOUNTER — Encounter (HOSPITAL_COMMUNITY): Payer: Self-pay

## 2018-11-13 ENCOUNTER — Inpatient Hospital Stay (HOSPITAL_COMMUNITY)
Admission: EM | Admit: 2018-11-13 | Discharge: 2018-11-19 | DRG: 287 | Disposition: A | Discharge status: Home / Self Care | Payer: 59 | Attending: Internal Medicine | Admitting: Internal Medicine

## 2018-11-13 DIAGNOSIS — I5082 Biventricular heart failure: Secondary | ICD-10-CM | POA: Diagnosis present

## 2018-11-13 DIAGNOSIS — I110 Hypertensive heart disease with heart failure: Secondary | ICD-10-CM | POA: Diagnosis not present

## 2018-11-13 DIAGNOSIS — R0600 Dyspnea, unspecified: Secondary | ICD-10-CM | POA: Diagnosis not present

## 2018-11-13 DIAGNOSIS — I509 Heart failure, unspecified: Secondary | ICD-10-CM

## 2018-11-13 DIAGNOSIS — I5043 Acute on chronic combined systolic (congestive) and diastolic (congestive) heart failure: Secondary | ICD-10-CM | POA: Diagnosis not present

## 2018-11-13 DIAGNOSIS — Z8249 Family history of ischemic heart disease and other diseases of the circulatory system: Secondary | ICD-10-CM

## 2018-11-13 DIAGNOSIS — I272 Pulmonary hypertension, unspecified: Secondary | ICD-10-CM | POA: Diagnosis present

## 2018-11-13 DIAGNOSIS — Z801 Family history of malignant neoplasm of trachea, bronchus and lung: Secondary | ICD-10-CM

## 2018-11-13 DIAGNOSIS — Z794 Long term (current) use of insulin: Secondary | ICD-10-CM

## 2018-11-13 DIAGNOSIS — I2510 Atherosclerotic heart disease of native coronary artery without angina pectoris: Secondary | ICD-10-CM | POA: Diagnosis not present

## 2018-11-13 DIAGNOSIS — E782 Mixed hyperlipidemia: Secondary | ICD-10-CM | POA: Diagnosis not present

## 2018-11-13 DIAGNOSIS — I43 Cardiomyopathy in diseases classified elsewhere: Secondary | ICD-10-CM | POA: Diagnosis present

## 2018-11-13 DIAGNOSIS — R0602 Shortness of breath: Secondary | ICD-10-CM | POA: Diagnosis not present

## 2018-11-13 DIAGNOSIS — E11649 Type 2 diabetes mellitus with hypoglycemia without coma: Secondary | ICD-10-CM | POA: Diagnosis not present

## 2018-11-13 DIAGNOSIS — E785 Hyperlipidemia, unspecified: Secondary | ICD-10-CM | POA: Diagnosis not present

## 2018-11-13 DIAGNOSIS — E876 Hypokalemia: Secondary | ICD-10-CM | POA: Diagnosis present

## 2018-11-13 DIAGNOSIS — I428 Other cardiomyopathies: Secondary | ICD-10-CM | POA: Diagnosis not present

## 2018-11-13 DIAGNOSIS — E119 Type 2 diabetes mellitus without complications: Secondary | ICD-10-CM | POA: Diagnosis not present

## 2018-11-13 DIAGNOSIS — Z90710 Acquired absence of both cervix and uterus: Secondary | ICD-10-CM

## 2018-11-13 DIAGNOSIS — Z833 Family history of diabetes mellitus: Secondary | ICD-10-CM | POA: Diagnosis not present

## 2018-11-13 DIAGNOSIS — E118 Type 2 diabetes mellitus with unspecified complications: Secondary | ICD-10-CM | POA: Diagnosis present

## 2018-11-13 DIAGNOSIS — Z8679 Personal history of other diseases of the circulatory system: Secondary | ICD-10-CM | POA: Diagnosis not present

## 2018-11-13 DIAGNOSIS — E1169 Type 2 diabetes mellitus with other specified complication: Secondary | ICD-10-CM | POA: Diagnosis not present

## 2018-11-13 DIAGNOSIS — I340 Nonrheumatic mitral (valve) insufficiency: Secondary | ICD-10-CM | POA: Diagnosis not present

## 2018-11-13 DIAGNOSIS — I5023 Acute on chronic systolic (congestive) heart failure: Secondary | ICD-10-CM | POA: Diagnosis present

## 2018-11-13 DIAGNOSIS — R531 Weakness: Secondary | ICD-10-CM | POA: Diagnosis not present

## 2018-11-13 DIAGNOSIS — Z79899 Other long term (current) drug therapy: Secondary | ICD-10-CM

## 2018-11-13 DIAGNOSIS — I10 Essential (primary) hypertension: Secondary | ICD-10-CM | POA: Diagnosis present

## 2018-11-13 LAB — CBC
HCT: 43.4 % (ref 36.0–46.0)
Hemoglobin: 13.6 g/dL (ref 12.0–15.0)
MCH: 28.8 pg (ref 26.0–34.0)
MCHC: 31.3 g/dL (ref 30.0–36.0)
MCV: 91.9 fL (ref 80.0–100.0)
Platelets: 315 10*3/uL (ref 150–400)
RBC: 4.72 MIL/uL (ref 3.87–5.11)
RDW: 13.6 % (ref 11.5–15.5)
WBC: 5.9 10*3/uL (ref 4.0–10.5)
nRBC: 0 % (ref 0.0–0.2)

## 2018-11-13 LAB — BASIC METABOLIC PANEL
Anion gap: 6 (ref 5–15)
BUN: 14 mg/dL (ref 8–23)
CHLORIDE: 106 mmol/L (ref 98–111)
CO2: 29 mmol/L (ref 22–32)
CREATININE: 0.76 mg/dL (ref 0.44–1.00)
Calcium: 9.3 mg/dL (ref 8.9–10.3)
GFR calc Af Amer: >60 mL/min (ref >60)
GFR calc non Af Amer: >60 mL/min (ref >60)
Glucose, Bld: 171 mg/dL — ABNORMAL HIGH (ref 70–99)
Potassium: 3.6 mmol/L (ref 3.5–5.1)
Sodium: 141 mmol/L (ref 135–145)

## 2018-11-13 NOTE — ED Triage Notes (Signed)
Pt here from home with shortness of breath over the last day.  Seen at [place] and sent home.  Said she is still short of breath.  Did not want to have another X Ray done but was okay with blood work and Copy.  A&Ox4

## 2018-11-14 ENCOUNTER — Ambulatory Visit (HOSPITAL_BASED_OUTPATIENT_CLINIC_OR_DEPARTMENT_OTHER): Payer: 59

## 2018-11-14 ENCOUNTER — Encounter (HOSPITAL_COMMUNITY): Payer: Self-pay | Admitting: Internal Medicine

## 2018-11-14 DIAGNOSIS — I340 Nonrheumatic mitral (valve) insufficiency: Secondary | ICD-10-CM

## 2018-11-14 DIAGNOSIS — I509 Heart failure, unspecified: Secondary | ICD-10-CM

## 2018-11-14 DIAGNOSIS — I2510 Atherosclerotic heart disease of native coronary artery without angina pectoris: Secondary | ICD-10-CM | POA: Insufficient documentation

## 2018-11-14 DIAGNOSIS — E119 Type 2 diabetes mellitus without complications: Secondary | ICD-10-CM

## 2018-11-14 LAB — ECHOCARDIOGRAM COMPLETE
Height: 65.5 in
Weight: 2646.4 oz

## 2018-11-14 LAB — HEMOGLOBIN A1C
Hgb A1c MFr Bld: 6.9 % — ABNORMAL HIGH (ref 4.8–5.6)
Mean Plasma Glucose: 151.33 mg/dL

## 2018-11-14 LAB — GLUCOSE, CAPILLARY
Glucose-Capillary: 199 mg/dL — ABNORMAL HIGH (ref 70–99)
Glucose-Capillary: 146 mg/dL — ABNORMAL HIGH (ref 70–99)
Glucose-Capillary: 125 mg/dL — ABNORMAL HIGH (ref 70–99)
Glucose-Capillary: 225 mg/dL — ABNORMAL HIGH (ref 70–99)

## 2018-11-14 LAB — HIV ANTIBODY (ROUTINE TESTING W REFLEX): HIV SCREEN 4TH GENERATION: NONREACTIVE

## 2018-11-14 LAB — TROPONIN I: Troponin I: <0.03 ng/mL (ref <0.03)

## 2018-11-14 LAB — BRAIN NATRIURETIC PEPTIDE: B Natriuretic Peptide: 520 pg/mL — ABNORMAL HIGH (ref 0.0–100.0)

## 2018-11-14 MED ORDER — ENOXAPARIN SODIUM 40 MG/0.4ML ~~LOC~~ SOLN
40.0000 mg | SUBCUTANEOUS | Status: DC
  Administered 2018-11-14 – 2018-11-16 (×3): 40 mg via SUBCUTANEOUS
  Filled 2018-11-14 (×3): qty 0.4

## 2018-11-14 MED ORDER — PERFLUTREN LIPID MICROSPHERE
1.0000 mL | INTRAVENOUS | Status: AC | PRN
  Administered 2018-11-14: 3 mL via INTRAVENOUS
  Filled 2018-11-14: qty 10

## 2018-11-14 MED ORDER — INSULIN ASPART 100 UNIT/ML ~~LOC~~ SOLN
0.0000 [IU] | Freq: Every day | SUBCUTANEOUS | Status: DC
  Administered 2018-11-14: 2 [IU] via SUBCUTANEOUS

## 2018-11-14 MED ORDER — FUROSEMIDE 10 MG/ML IJ SOLN
40.0000 mg | Freq: Once | INTRAMUSCULAR | Status: AC
  Administered 2018-11-14: 40 mg via INTRAVENOUS
  Filled 2018-11-14: qty 4

## 2018-11-14 MED ORDER — SODIUM CHLORIDE 0.9% FLUSH
3.0000 mL | Freq: Two times a day (BID) | INTRAVENOUS | Status: DC
  Administered 2018-11-15 – 2018-11-19 (×6): 3 mL via INTRAVENOUS

## 2018-11-14 MED ORDER — SIMVASTATIN 20 MG PO TABS
20.0000 mg | ORAL_TABLET | Freq: Every evening | ORAL | Status: DC
  Administered 2018-11-14 – 2018-11-18 (×5): 20 mg via ORAL
  Filled 2018-11-14 (×5): qty 1

## 2018-11-14 MED ORDER — ONDANSETRON HCL 4 MG/2ML IJ SOLN: 4.0000 mg | Freq: Four times a day (QID) | INTRAMUSCULAR | Status: DC | PRN

## 2018-11-14 MED ORDER — INSULIN ASPART 100 UNIT/ML ~~LOC~~ SOLN
0.0000 [IU] | Freq: Three times a day (TID) | SUBCUTANEOUS | Status: DC
  Administered 2018-11-14: 2 [IU] via SUBCUTANEOUS
  Administered 2018-11-14: 3 [IU] via SUBCUTANEOUS
  Administered 2018-11-14: 2 [IU] via SUBCUTANEOUS
  Administered 2018-11-15 (×2): 3 [IU] via SUBCUTANEOUS
  Administered 2018-11-15: 2 [IU] via SUBCUTANEOUS
  Administered 2018-11-16: 8 [IU] via SUBCUTANEOUS
  Administered 2018-11-16 (×2): 5 [IU] via SUBCUTANEOUS
  Administered 2018-11-17: 8 [IU] via SUBCUTANEOUS
  Administered 2018-11-18: 5 [IU] via SUBCUTANEOUS
  Administered 2018-11-18 (×2): 3 [IU] via SUBCUTANEOUS
  Administered 2018-11-19: 2 [IU] via SUBCUTANEOUS

## 2018-11-14 MED ORDER — CARVEDILOL 25 MG PO TABS
50.0000 mg | ORAL_TABLET | Freq: Two times a day (BID) | ORAL | Status: DC
  Administered 2018-11-14 – 2018-11-15 (×3): 50 mg via ORAL
  Filled 2018-11-14 (×3): qty 2

## 2018-11-14 MED ORDER — SODIUM CHLORIDE 0.9% FLUSH
3.0000 mL | INTRAVENOUS | Status: DC | PRN
  Administered 2018-11-14 – 2018-11-16 (×2): 3 mL via INTRAVENOUS
  Filled 2018-11-14 (×2): qty 3

## 2018-11-14 MED ORDER — FELODIPINE ER 2.5 MG PO TB24
2.5000 mg | ORAL_TABLET | Freq: Every day | ORAL | Status: DC
  Administered 2018-11-14 – 2018-11-15 (×2): 2.5 mg via ORAL
  Filled 2018-11-14 (×2): qty 1

## 2018-11-14 MED ORDER — FUROSEMIDE 10 MG/ML IJ SOLN
40.0000 mg | Freq: Two times a day (BID) | INTRAMUSCULAR | Status: DC
  Administered 2018-11-14 – 2018-11-16 (×4): 40 mg via INTRAVENOUS
  Filled 2018-11-14 (×4): qty 4

## 2018-11-14 MED ORDER — ASPIRIN EC 81 MG PO TBEC
81.0000 mg | DELAYED_RELEASE_TABLET | Freq: Every day | ORAL | Status: DC
  Administered 2018-11-14 – 2018-11-16 (×3): 81 mg via ORAL
  Filled 2018-11-14 (×4): qty 1

## 2018-11-14 MED ORDER — ACETAMINOPHEN 325 MG PO TABS: 650.0000 mg | ORAL_TABLET | ORAL | Status: DC | PRN

## 2018-11-14 MED ORDER — IRBESARTAN 300 MG PO TABS
300.0000 mg | ORAL_TABLET | Freq: Every day | ORAL | Status: DC
  Administered 2018-11-14 – 2018-11-16 (×3): 300 mg via ORAL
  Filled 2018-11-14 (×3): qty 1

## 2018-11-14 MED ORDER — SODIUM CHLORIDE 0.9 % IV SOLN: 250.0000 mL | INTRAVENOUS | Status: DC | PRN

## 2018-11-14 NOTE — H&P (Addendum)
History and Physical    Toni [name] [id number] DOB: 08/25/1956 DOA: 11/13/2018  PCP: Donald Prose, MD Consultants:  Tamala Julian - cardiology; Vukojicjc - endocrinology Patient coming from:  Home - lives alone; NOK: Lelan Pons, [place]  Chief Complaint: SOB  HPI: Toni [name] is a 62 y.o. female with medical history significant of HTN; DM; CAD; and systolic CHF presenting with SOB.  For the last month, she has been having intermittent illness.  She has h/o bronchitis and thought maybe this was the problem.  A couple of times this week, she thought she was wheezing and then took an extra Lasix.  Last night, she was having trouble sleeping because of breathing.  Yesterday AM, she got to [facility] for work and couldn't get across the parking lot due to SOB.  She worked all day yesterday and went to the clinic at the end of the day.  Her PCP sent her to ER.  No chest pain.  No edema.  She is part of the [employer] and weighs herself regularly; from last week to this week she is up 10 pounds in 1 week.  +cough, productive of clear and frothy sputum.  No orthopnea.  Just one night of PND.   ED Course: Carryover, per Dr. Alcario Drought:  62 yo F with h/o NICM, h/o EF 15% back in 2012, but this normalized to 50-55% in 2017.  Now here with class 2-3 CHF symptoms. CXR shows B pleural effusions and cardiomegally (done at [facility] and can be pulled up through pacs). BNP is 500. Lasix 40mg  IV x1 in ED.     Review of Systems: As per HPI; otherwise review of systems reviewed and negative.   Ambulatory Status:  Ambulates without assistance  Past Medical History:  Diagnosis Date  . CHF (congestive heart failure) ([place])   . Coronary artery disease   . Diabetes mellitus without complication ([place])   . Hypertension     Past Surgical History:  Procedure Laterality Date  . ABDOMINAL HYSTERECTOMY      Social History   Socioeconomic History  . Marital status: Single    Spouse name: Not on file    . Number of children: Not on file  . Years of education: Not on file  . Highest education level: Not on file  Occupational History  . Occupation: Development worker, community  Social Needs  . Financial resource strain: Not on file  . Food insecurity:    Worry: Not on file    Inability: Not on file  . Transportation needs:    Medical: Not on file    Non-medical: Not on file  Tobacco Use  . Smoking status: Never Smoker  . Smokeless tobacco: Never Used  Substance and Sexual Activity  . Alcohol use: Yes    Alcohol/week: 0.0 standard drinks    Comment: less than once a month  . Drug use: No  . Sexual activity: Never  Lifestyle  . Physical activity:    Days per week: Not on file    Minutes per session: Not on file  . Stress: Not on file  Relationships  . Social connections:    Talks on phone: Not on file    Gets together: Not on file    Attends religious service: Not on file    Active member of club or organization: Not on file    Attends meetings of clubs or organizations: Not on file    Relationship status: Not on file  . Intimate  partner violence:    Fear of current or ex partner: Not on file    Emotionally abused: Not on file    Physically abused: Not on file    Forced sexual activity: Not on file  Other Topics Concern  . Not on file  Social History Narrative  . Not on file    Allergies  Allergen Reactions  . Biaxin [Clarithromycin]   . Enalapril Maleate Other (See Comments)    lip  . Vasotec [Enalapril]     Family History  Problem Relation Age of Onset  . Diabetes Mellitus I Mother   . Lung cancer Mother   . Diabetes Mellitus I Father   . Hypertension Brother   . Hypertension Brother   . Diabetes Mellitus I Brother   . Benign prostatic hyperplasia Brother   . Heart failure Paternal Uncle     Prior to Admission medications   Medication Sig Start Date End Date Taking? Authorizing Provider  carvedilol (COREG) 25 MG tablet Take 50 mg by mouth 2 (two) times  daily with a meal.    Yes [provider]  dapagliflozin propanediol (FARXIGA) 5 MG TABS tablet Take 5 mg by mouth daily.   Yes [provider]  felodipine (PLENDIL) 2.5 MG 24 hr tablet Take 2.5 mg by mouth daily.   Yes [provider]  furosemide (LASIX) 20 MG tablet Take 20 mg by mouth daily.    Yes [provider]  glipiZIDE (GLUCOTROL XL) 10 MG 24 hr tablet Take 10 mg by mouth 2 (two) times daily with a meal.    Yes [provider]  Liraglutide (VICTOZA [place]) Inject 1.8 mg into the skin every evening.    Yes [provider]  metFORMIN (GLUCOPHAGE) 1000 MG tablet Take 1,000 mg by mouth 2 (two) times daily with a meal.   Yes [provider]  Multiple Vitamins-Minerals (CENTRUM SILVER PO) Take 1 tablet by mouth daily. Reported on 02/10/2016   Yes [provider]  potassium chloride SA (K-DUR,KLOR-CON) 20 MEQ tablet Take 20 mEq by mouth daily. 12/05/15  Yes [provider]  simvastatin (ZOCOR) 20 MG tablet Take 20 mg by mouth every evening.   Yes [provider]  valsartan (DIOVAN) 320 MG tablet Take 320 mg by mouth daily.   Yes [provider]  benzonatate (TESSALON) 100 MG capsule Take 1 capsule (100 mg total) by mouth 3 (three) times daily as needed for cough. Patient not taking: Reported on 11/14/2018 01/19/17   Shelda Pal, DO  tobramycin (TOBREX) 0.3 % ophthalmic solution Place 1 drop into both eyes every 4 (four) hours. Patient not taking: Reported on 11/14/2018 01/19/17   Shelda Pal, DO  TRUE METRIX BLOOD GLUCOSE TEST test strip Use as directed. 12/05/15   [provider]  TRUEPLUS LANCETS 30G MISC Use as directed. 12/05/15   [provider]    Physical Exam: Vitals:   11/14/18 0430 11/14/18 0550 11/14/18 0630 11/14/18 0700  BP: (!) 141/95 128/76 120/82 115/82  Pulse: 100 91 86 80  Resp: 17 16 19 20   Temp:      TempSrc:      SpO2: 98% 98% 93% 96%      General:  Appears calm and comfortable and is NAD Eyes:  PERRL, EOMI, normal lids, iris ENT:  grossly normal hearing, lips & tongue, mmm; appropriate dentition Neck:  no LAD, masses or thyromegaly Cardiovascular:  RRR, no m/r/g. No LE edema.  Respiratory:   CTA  bilaterally with no wheezes/rales/rhonchi.  Normal respiratory effort. Abdomen:  soft, NT, ND, NABS Back:   normal alignment, no CVAT Skin:  no rash or induration seen on limited exam Musculoskeletal:  grossly normal tone BUE/BLE, good ROM, no bony abnormality Psychiatric:  grossly normal mood and affect, speech fluent and appropriate, AOx3 Neurologic:  CN 2-12 grossly intact, moves all extremities in coordinated fashion, sensation intact    Radiological Exams on Admission: CXR done at [facility] and showed B pleural effusions and cardiomegaly, worse than prior  EKG: Independently reviewed.  NSR with rate 96; nonspecific ST changes with no evidence of acute ischemia   Labs on Admission: I have personally reviewed the available labs and imaging studies at the time of the admission.  Pertinent labs:   Glucose 171 BNP 520.0 Troponin <0.03 Normal CBC   Assessment/Plan Principal Problem:   Acute on chronic systolic CHF (congestive heart failure) ([name]) Active Problems:   Hypertension   Hyperlipidemia   Diabetes mellitus without complication ([name])   CHF exacerbation -Patient with prior h/o markedly depressed EF with subsequent improvement now presenting with recurrent SOB and DOE with cough productive of clear/frothy sputum -Elevated BNP -Will place in observation status with telemetry -Will repeat echocardiogram -Will start ASA -Will continue ARG and Coreg (she takes a very high dose of Coreg) -CHF order set utilized; may need CHF team consult but will hold until Echo results are available -Was given Lasix 40 mg x 1 in ER and will repeat with40 mg IV BID -Continue [place] O2 prn for now -Normal kidney function at this  time, will follow -Repeat EKG in AM  HTN -She has hypertensive cardiomyopathy with refractory HTN -Continue home meds including Coreg, Plendil, and Diovan  HLD -Continue Zocor -Check lipids  DM -Last A1c was 7.6 in 12/17 -Hold home meds including Farxiga, Glucotrol, Victoza, and Glucophage -Will cover with moderate-scale SSI for now   DVT prophylaxis: Lovenox  Code Status:  Full - confirmed with patient Family Communication: None present Disposition Plan:  Home once clinically improved Consults called: None Admission status: It is my clinical opinion that referral for OBSERVATION is reasonable and necessary in this patient based on the above information provided. The aforementioned taken together are felt to place the patient at high risk for further clinical deterioration. However it is anticipated that the patient may be medically stable for discharge from the hospital within 24 to 48 hours.  Karmen Bongo MD [facility]  If note is complete, please contact covering daytime or nighttime physician. [email or web address] Password [facility]  11/14/2018, 7:49 AM

## 2018-11-14 NOTE — ED Provider Notes (Signed)
[facility] EMERGENCY DEPARTMENT Provider Note   CSN: [id number] Arrival date & time: 11/13/18  2110     History   Chief Complaint Chief Complaint  Patient presents with  . Shortness of Breath    HPI Toni [name] is a 62 y.o. female.  HPI  62 year old female with past medical history of coronary disease, hypertension, diabetes, CHF, here with shortness of breath.  The patient states she had a history of what sounds like viral myocarditis in the past, with most recent echo of 50 to 55% in 2017.  She states that over the last several weeks, she is noticed progressive worsening cough and shortness of breath.  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  Over the last several days, she is noticed significant worsening her shortness of breath.  She is now not able to walk from her parking spot to the hospital, without becoming extremely winded and waiting several minutes to catch her breath.  She denies any orthopnea or leg swelling.  No fevers or chills.  No recent illnesses.  No chest pain.  She saw her doctor later sent her here for further evaluation.  No specific alleviating factors.   Past Medical History:  Diagnosis Date  . CHF (congestive heart failure) ([place])   . Coronary artery disease   . Diabetes mellitus without complication ([place])   . Hypertension     Patient Active Problem List   Diagnosis Date Noted  . Acute CHF (congestive heart failure) ([place]) 11/14/2018  . Chronic systolic heart failure ([place]) 01/04/2016  . Hyperlipidemia 01/03/2016  . Cardiomegaly - hypertensive 01/03/2016  . DM (diabetes mellitus), type 2 with complications ([place]) 11/27/3233  . Hypertension 01/03/2014    Past Surgical History:  Procedure Laterality Date  . ABDOMINAL HYSTERECTOMY       OB History   No obstetric history on file.      Home Medications    Prior to Admission medications   Medication Sig Start Date End Date Taking? Authorizing Provider    carvedilol (COREG) 25 MG tablet Take 50 mg by mouth 2 (two) times daily with a meal.    Yes [provider]  dapagliflozin propanediol (FARXIGA) 5 MG TABS tablet Take 5 mg by mouth daily.   Yes [provider]  felodipine (PLENDIL) 2.5 MG 24 hr tablet Take 2.5 mg by mouth daily.   Yes [provider]  furosemide (LASIX) 20 MG tablet Take 20 mg by mouth daily.    Yes [provider]  glipiZIDE (GLUCOTROL XL) 10 MG 24 hr tablet Take 10 mg by mouth 2 (two) times daily with a meal.    Yes [provider]  Liraglutide (VICTOZA [place]) Inject 1.8 mg into the skin every evening.    Yes [provider]  metFORMIN (GLUCOPHAGE) 1000 MG tablet Take 1,000 mg by mouth 2 (two) times daily with a meal.   Yes [provider]  Multiple Vitamins-Minerals (CENTRUM SILVER PO) Take 1 tablet by mouth daily. Reported on 02/10/2016   Yes [provider]  potassium chloride SA (K-DUR,KLOR-CON) 20 MEQ tablet Take 20 mEq by mouth daily. 12/05/15  Yes [provider]  simvastatin (ZOCOR) 20 MG tablet Take 20 mg by mouth every evening.   Yes [provider]  valsartan (DIOVAN) 320 MG tablet Take 320 mg by mouth daily.   Yes [provider]  benzonatate (TESSALON) 100 MG capsule Take 1 capsule (100 mg total)  by mouth 3 (three) times daily as needed for cough. Patient not taking: Reported on 11/14/2018 01/19/17   Shelda Pal, DO  tobramycin (TOBREX) 0.3 % ophthalmic solution Place 1 drop into both eyes every 4 (four) hours. Patient not taking: Reported on 11/14/2018 01/19/17   Shelda Pal, DO  TRUE METRIX BLOOD GLUCOSE TEST test strip Use as directed. 12/05/15   [provider]  TRUEPLUS LANCETS 30G MISC Use as directed. 12/05/15   [provider]    Family History Family History  Problem Relation Age of Onset  . Diabetes Mellitus I Mother   . Lung cancer Mother   . Diabetes Mellitus I Father    . Hypertension Brother   . Hypertension Brother   . Diabetes Mellitus I Brother   . Benign prostatic hyperplasia Brother   . Heart failure Paternal Uncle     Social History Social History   Tobacco Use  . Smoking status: Never Smoker  . Smokeless tobacco: Never Used  Substance Use Topics  . Alcohol use: Yes    Alcohol/week: 0.0 standard drinks    Comment: less than once a month  . Drug use: No     Allergies   Biaxin [clarithromycin]; Enalapril maleate; and Vasotec [enalapril]   Review of Systems Review of Systems  Constitutional: Positive for fatigue. Negative for chills and fever.  HENT: Negative for congestion and rhinorrhea.   Eyes: Negative for visual disturbance.  Respiratory: Positive for cough and shortness of breath. Negative for wheezing.   Cardiovascular: Positive for leg swelling. Negative for chest pain.  Gastrointestinal: Negative for abdominal pain, diarrhea, nausea and vomiting.  Genitourinary: Negative for dysuria and flank pain.  Musculoskeletal: Negative for neck pain and neck stiffness.  Skin: Negative for rash and wound.  Allergic/Immunologic: Negative for immunocompromised state.  Neurological: Positive for weakness. Negative for syncope and headaches.  All other systems reviewed and are negative.    Physical Exam Updated Vital Signs BP 115/82   Pulse 80   Temp 98.6 F (37 C) (Oral)   Resp 20   SpO2 96%   Physical Exam Vitals signs and nursing note reviewed.  Constitutional:      General: She is not in acute distress.    Appearance: She is well-developed.  HENT:     Head: Normocephalic and atraumatic.  Eyes:     Conjunctiva/sclera: Conjunctivae normal.  Neck:     Musculoskeletal: Neck supple.  Cardiovascular:     Rate and Rhythm: Normal rate and regular rhythm.     Heart sounds: Normal heart sounds. No murmur. No friction rub.  Pulmonary:     Effort: Pulmonary effort is normal. Tachypnea present. No respiratory distress.      Breath sounds: Rales present. No wheezing.  Abdominal:     General: There is no distension.     Palpations: Abdomen is soft.     Tenderness: There is no abdominal tenderness.  Skin:    General: Skin is warm.     Capillary Refill: Capillary refill takes less than 2 seconds.  Neurological:     Mental Status: She is alert and oriented to person, place, and time.     Motor: No abnormal muscle tone.      ED Treatments / Results  Labs (all labs ordered are listed, but only abnormal results are displayed) Labs Reviewed  BASIC METABOLIC PANEL - Abnormal; Notable for the following components:      Result Value   Glucose, Bld 171 (*)  All other components within normal limits  BRAIN NATRIURETIC PEPTIDE - Abnormal; Notable for the following components:   B Natriuretic Peptide 520.0 (*)    All other components within normal limits  CBC  TROPONIN I    EKG EKG Interpretation  Date/Time:  Thursday November 13 2018 21:16:33 EST Ventricular Rate:  96 PR Interval:  140 QRS Duration: 84 QT Interval:  388 QTC Calculation: 490 R Axis:   54 Text Interpretation:  Normal sinus rhythm Possible Left atrial enlargement Possible Anterior infarct , age undetermined Abnormal ECG When compared with ECG of 02/08/1999, Nonspecific T wave abnormality is no longer present Confirmed by Delora Fuel ([id number]) on 11/13/2018 11:54:53 PM Also confirmed by Delora Fuel ([id number]), editor Philomena Doheny [phone])  on 11/14/2018 7:26:18 AM   Radiology No results found.  Procedures Procedures (including critical care time)  Medications Ordered in ED Medications  furosemide (LASIX) injection 40 mg (40 mg Intravenous Given 11/14/18 0419)     Initial Impression / Assessment and Plan / ED Course  I have reviewed the triage vital signs and the nursing notes.  Pertinent labs & imaging results that were available during my care of the patient were reviewed by me and considered in my medical decision making (see  chart for details).    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 Last EF reportedly normal in 2017, but now with symptoms of CHF. No recent viral illness. No CP or signs of ischemia. However, pt now with bilateral effusions on CXR, elevated BNP, and sx of DOE with just slight movement. Likely needs diuresis, repeat TTE and given acute worsening today with effusions and marked new cardiomegaly on CXR, TTE as obs pt.  Final Clinical Impressions(s) / ED Diagnoses   Final diagnoses:  Acute on chronic congestive heart failure, unspecified heart failure type [facility])    ED Discharge Orders    None       Duffy Bruce, MD 11/14/18 [phone]

## 2018-11-14 NOTE — Progress Notes (Signed)
  Echocardiogram 2D Echocardiogram has been performed.  Jennette Dubin 11/14/2018, 3:23 PM

## 2018-11-15 ENCOUNTER — Encounter (HOSPITAL_COMMUNITY): Payer: Self-pay | Admitting: Physician Assistant

## 2018-11-15 DIAGNOSIS — E876 Hypokalemia: Secondary | ICD-10-CM | POA: Diagnosis present

## 2018-11-15 DIAGNOSIS — I5023 Acute on chronic systolic (congestive) heart failure: Secondary | ICD-10-CM

## 2018-11-15 LAB — CBC WITH DIFFERENTIAL/PLATELET
Abs Immature Granulocytes: 0.02 10*3/uL (ref 0.00–0.07)
Basophils Absolute: 0 10*3/uL (ref 0.0–0.1)
Basophils Relative: 0 %
Eosinophils Absolute: 0.1 10*3/uL (ref 0.0–0.5)
Eosinophils Relative: 2 %
HCT: 40 % (ref 36.0–46.0)
Hemoglobin: 12.5 g/dL (ref 12.0–15.0)
Immature Granulocytes: 0 %
Lymphocytes Relative: 46 %
Lymphs Abs: 2.4 10*3/uL (ref 0.7–4.0)
MCH: 28.6 pg (ref 26.0–34.0)
MCHC: 31.3 g/dL (ref 30.0–36.0)
MCV: 91.5 fL (ref 80.0–100.0)
MONOS PCT: 7 %
Monocytes Absolute: 0.4 10*3/uL (ref 0.1–1.0)
NEUTROS PCT: 45 %
Neutro Abs: 2.4 10*3/uL (ref 1.7–7.7)
Platelets: 266 10*3/uL (ref 150–400)
RBC: 4.37 MIL/uL (ref 3.87–5.11)
RDW: 13.7 % (ref 11.5–15.5)
WBC: 5.4 10*3/uL (ref 4.0–10.5)
nRBC: 0 % (ref 0.0–0.2)

## 2018-11-15 LAB — BASIC METABOLIC PANEL
ANION GAP: 10 (ref 5–15)
BUN: 14 mg/dL (ref 8–23)
CO2: 28 mmol/L (ref 22–32)
Calcium: 9.1 mg/dL (ref 8.9–10.3)
Chloride: 102 mmol/L (ref 98–111)
Creatinine, Ser: 0.72 mg/dL (ref 0.44–1.00)
GFR calc Af Amer: >60 mL/min (ref >60)
GFR calc non Af Amer: >60 mL/min (ref >60)
GLUCOSE: 141 mg/dL — AB (ref 70–99)
Potassium: 3.2 mmol/L — ABNORMAL LOW (ref 3.5–5.1)
Sodium: 140 mmol/L (ref 135–145)

## 2018-11-15 LAB — GLUCOSE, CAPILLARY
Glucose-Capillary: 150 mg/dL — ABNORMAL HIGH (ref 70–99)
Glucose-Capillary: 189 mg/dL — ABNORMAL HIGH (ref 70–99)
Glucose-Capillary: 173 mg/dL — ABNORMAL HIGH (ref 70–99)
Glucose-Capillary: 193 mg/dL — ABNORMAL HIGH (ref 70–99)

## 2018-11-15 LAB — TSH: TSH: 0.637 u[IU]/mL (ref 0.350–4.500)

## 2018-11-15 LAB — LIPID PANEL
Cholesterol: 182 mg/dL (ref 0–200)
HDL: 38 mg/dL — AB (ref >40)
LDL Cholesterol: 117 mg/dL — ABNORMAL HIGH (ref 0–99)
Total CHOL/HDL Ratio: 4.8 RATIO
Triglycerides: 134 mg/dL (ref <150)
VLDL: 27 mg/dL (ref 0–40)

## 2018-11-15 LAB — MAGNESIUM: Magnesium: 1.9 mg/dL (ref 1.7–2.4)

## 2018-11-15 MED ORDER — CARVEDILOL 25 MG PO TABS
25.0000 mg | ORAL_TABLET | Freq: Two times a day (BID) | ORAL | Status: DC
  Administered 2018-11-15 – 2018-11-19 (×7): 25 mg via ORAL
  Filled 2018-11-15 (×8): qty 1

## 2018-11-15 MED ORDER — POTASSIUM CHLORIDE CRYS ER 20 MEQ PO TBCR
40.0000 meq | EXTENDED_RELEASE_TABLET | Freq: Two times a day (BID) | ORAL | Status: DC
  Administered 2018-11-15 – 2018-11-16 (×3): 40 meq via ORAL
  Filled 2018-11-15 (×3): qty 2

## 2018-11-15 NOTE — Consult Note (Addendum)
Cardiology Consultation:   Patient ID: Toni [name]; [id number]; 05/27/1956   Admit date: 11/13/2018 Date of Consult: 11/15/2018  Primary Care Provider: Donald Prose, MD Primary Cardiologist: Sinclair Grooms, MD 01/04/2016 Primary Electrophysiologist:  None   Patient Profile:   Toni [name] is a 62 y.o. female with a hx of minimal CAD, S-CHF, NICM, DM, HTN, HLD, who is being seen today for the evaluation of CHF exacerbation at the request of Dr Lorin Mercy.  History of Present Illness:   Ms. Menchaca was last seen by Dr Tamala Julian in 2017. At that time, her wt was 171 lbs, volume was good, EF (prev 20%) had improved to normal by 2012.  She was doing well and taking rx prescribed by PCP which included Diovan and Coreg, low-dose Lasix.   She got sick around the end of October, cough and chest congestion. She was tired, might have had a low-grade fever. Took Mucinex and had some clear sputum. Once, it was foamy. Sx gradually improved.  She got better, was well until after Thanksgiving. Starting mid-December, she started noticing increased DOE. Christmas night, she had orthopnea and PND. No LE edema.   Went to work on Thursday, but very SOB w/ minimal activity. Went to clinic after work and was admitted.   She is pretty careful about sodium but admits she ate [place] food Christmas day and holiday social events have caused her to eat out more than usual. Howver, was not having problems w/ LE edema, DOE, orthopnea or PND till mid-December.  The only chest pain she was some chest tightness on Christmas night. That resolved w/out intervention.    Past Medical History:  Diagnosis Date  . CHF (congestive heart failure) ([place])   . Coronary artery disease   . Diabetes mellitus without complication ([place])   . Hypertension     Past Surgical History:  Procedure Laterality Date  . ABDOMINAL HYSTERECTOMY    . CARDIAC CATHETERIZATION     in [facility], clean, per pt.     Prior to Admission  medications   Medication Sig Start Date End Date Taking? Authorizing Provider  carvedilol (COREG) 25 MG tablet Take 50 mg by mouth 2 (two) times daily with a meal.    Yes [provider]  dapagliflozin propanediol (FARXIGA) 5 MG TABS tablet Take 5 mg by mouth daily.   Yes [provider]  felodipine (PLENDIL) 2.5 MG 24 hr tablet Take 2.5 mg by mouth daily.   Yes [provider]  furosemide (LASIX) 20 MG tablet Take 20 mg by mouth daily.    Yes [provider]  glipiZIDE (GLUCOTROL XL) 10 MG 24 hr tablet Take 10 mg by mouth 2 (two) times daily with a meal.    Yes [provider]  Liraglutide (VICTOZA [place]) Inject 1.8 mg into the skin every evening.    Yes [provider]  metFORMIN (GLUCOPHAGE) 1000 MG tablet Take 1,000 mg by mouth 2 (two) times daily with a meal.   Yes [provider]  Multiple Vitamins-Minerals (CENTRUM SILVER PO) Take 1 tablet by mouth daily. Reported on 02/10/2016   Yes [provider]  potassium chloride SA (K-DUR,KLOR-CON) 20 MEQ tablet Take 20 mEq by mouth daily. 12/05/15  Yes [provider]  simvastatin (ZOCOR) 20 MG tablet Take 20 mg by mouth every evening.   Yes [provider]  valsartan (DIOVAN) 320 MG tablet Take 320 mg by mouth daily.   Yes [provider]  TRUE  METRIX BLOOD GLUCOSE TEST test strip Use as directed. 12/05/15   [provider]  TRUEPLUS LANCETS 30G MISC Use as directed. 12/05/15   [provider]    Inpatient Medications: Scheduled Meds: . aspirin EC  81 mg Oral Daily  . carvedilol  50 mg Oral BID WC  . enoxaparin (LOVENOX) injection  40 mg Subcutaneous Q24H  . felodipine  2.5 mg Oral Daily  . furosemide  40 mg Intravenous Q12H  . insulin aspart  0-15 Units Subcutaneous TID WC  . insulin aspart  0-5 Units Subcutaneous QHS  . irbesartan  300 mg Oral Daily  . potassium chloride  40 mEq Oral BID  . simvastatin  20 mg Oral QPM  . sodium  chloride flush  3 mL Intravenous Q12H   Continuous Infusions: . sodium chloride     PRN Meds: sodium chloride, acetaminophen, ondansetron (ZOFRAN) IV, sodium chloride flush  Allergies:    Allergies  Allergen Reactions  . Biaxin [Clarithromycin]   . Enalapril Maleate Other (See Comments)    lip  . Vasotec [Enalapril]     Social History:   Social History   Socioeconomic History  . Marital status: Single    Spouse name: Not on file  . Number of children: Not on file  . Years of education: Not on file  . Highest education level: Not on file  Occupational History  . Occupation: Surveyor, quantity: [employer]  . Financial resource strain: Not on file  . Food insecurity:    Worry: Not on file    Inability: Not on file  . Transportation needs:    Medical: Not on file    Non-medical: Not on file  Tobacco Use  . Smoking status: Never Smoker  . Smokeless tobacco: Never Used  Substance and Sexual Activity  . Alcohol use: Yes    Alcohol/week: 0.0 standard drinks    Comment: less than once a month  . Drug use: No  . Sexual activity: Never  Lifestyle  . Physical activity:    Days per week: Not on file    Minutes per session: Not on file  . Stress: Not on file  Relationships  . Social connections:    Talks on phone: Not on file    Gets together: Not on file    Attends religious service: Not on file    Active member of club or organization: Not on file    Attends meetings of clubs or organizations: Not on file    Relationship status: Not on file  . Intimate partner violence:    Fear of current or ex partner: Not on file    Emotionally abused: Not on file    Physically abused: Not on file    Forced sexual activity: Not on file  Other Topics Concern  . Not on file  Social History Narrative  . Not on file    Family History:   Family History  Problem Relation Age of Onset  . Diabetes Mellitus I Mother   . Lung cancer Mother   . Diabetes  Mellitus I Father   . Hypertension Brother   . Hypertension Brother   . Diabetes Mellitus I Brother   . Benign prostatic hyperplasia Brother   . Heart failure Paternal Uncle    Family Status:  Family Status  Relation Name Status  . Mother  Deceased at age 56  . Father  Deceased at age 15  . Brother  Alive  . Brother  Alive  . Brother  (Not Specified)  . Brother  (Not Specified)  . Brother  (Not Specified)  . Brother  (Not Specified)  . Annamarie Major  (Not Specified)    ROS:  Please see the history of present illness.  All other ROS reviewed and negative.     Physical Exam/Data:   Vitals:   11/15/18 0014 11/15/18 0521 11/15/18 0826 11/15/18 1215  BP: 98/66 101/79  (!) 89/63  Pulse: 86 79  84  Resp: 18   17  Temp: 98.1 F (36.7 C) 98.3 F (36.8 C)  98.4 F (36.9 C)  TempSrc: Oral Oral  Oral  SpO2: 97% 99%  100%  Weight:  74.4 kg 74 kg   Height:        Intake/Output Summary (Last 24 hours) at 11/15/2018 1343 Last data filed at 11/15/2018 1309 Gross per 24 hour  Intake 360 ml  Output 3400 ml  Net -3040 ml   Filed Weights   11/14/18 0756 11/15/18 0521 11/15/18 0826  Weight: 75 kg 74.4 kg 74 kg   Body mass index is 26.73 kg/m.  General:  Well nourished, well developed, in no acute distress HEENT: normal Lymph: no adenopathy Neck: minimal JVD Endocrine:  No thryomegaly Vascular: No carotid bruits; 4/4 extremity pulses 2+, without bruits  Cardiac:  normal S1, S2; RRR; no murmur Lungs: decreased BS bases to auscultation bilaterally, no wheezing, rhonchi or rales  Abd: soft, nontender, no hepatomegaly  Ext: no edema Musculoskeletal:  No deformities, BUE and BLE strength normal and equal Skin: warm and dry  Neuro:  CNs 2-12 intact, no focal abnormalities noted Psych:  Normal affect   EKG:  The EKG was personally reviewed and demonstrates:  12/28, SR, HR 74, no acute ischemic changes, minor changes from 2017 Telemetry:  Telemetry was personally reviewed and  demonstrates:  SR  Relevant CV Studies:  ECHO: 11/14/2018 - Left ventricle: The cavity size was moderately dilated. Systolic   function was severely reduced. The estimated ejection fraction   was 15%. Diffuse, severe hypokinesis. Doppler parameters are   consistent with a reversible restrictive pattern, indicative of   decreased left ventricular diastolic compliance and/or increased   left atrial pressure (grade 3 diastolic dysfunction). Doppler   parameters are consistent with high ventricular filling pressure. - Aortic valve: Transvalvular velocity was within the normal range.   There was no stenosis. There was trivial regurgitation. - Mitral valve: There was moderate, central regurgitation, likely   secondary MR due to leaflet tenting. Valve area by continuity   equation (using LVOT flow): 0.92 cm^2. - Left atrium: The atrium was moderately dilated. - Right ventricle: The cavity size was normal. Wall thickness was   normal. Systolic function was mildly reduced. RV systolic   pressure (S, est): 55 mm Hg. - Right atrium: The atrium was mildly dilated. Central venous   pressure (est): 15 mm Hg. - Atrial septum: No defect or patent foramen ovale was identified. - Tricuspid valve: There was mild regurgitation. - Pulmonary arteries: Systolic pressure was moderately to severely   increased. PA peak pressure: 55 mm Hg (S). - Inferior vena cava: The vessel was dilated. The respirophasic   diameter changes were blunted (< 50%), consistent with elevated   central venous pressure.  Impressions:  - Severely reduced LV function with LVEF 15%. Moderate, central   mitral regurgitation likely secondary MR due to LV dysfunction.  CATH: Per pt was clean, Dr Tamala Julian references it  as clean also  Laboratory Data:  Chemistry Recent Labs  Lab 11/13/18 2138 11/15/18 0026  NA 141 140  K 3.6 3.2*  CL 106 102  CO2 29 28  GLUCOSE 171* 141*  BUN 14 14  CREATININE 0.76 0.72  CALCIUM 9.3 9.1    GFRNONAA >60 >60  GFRAA >60 >60  ANIONGAP 6 10    Lab Results  Component Value Date   ALT 17 05/14/2013   AST 19 05/14/2013   ALKPHOS 69 05/14/2013   BILITOT 0.3 05/14/2013   Hematology Recent Labs  Lab 11/13/18 2138 11/15/18 0026  WBC 5.9 5.4  RBC 4.72 4.37  HGB 13.6 12.5  HCT 43.4 40.0  MCV 91.9 91.5  MCH 28.8 28.6  MCHC 31.3 31.3  RDW 13.6 13.7  PLT 315 266   Cardiac Enzymes Recent Labs  Lab 11/14/18 0242  TROPONINI <0.03   No results for input(s): TROPIPOC in the last 168 hours.  BNP Recent Labs  Lab 11/14/18 0242  BNP 520.0*    DDimer No results for input(s): DDIMER in the last 168 hours. TSH: No results found for: TSH Lipids: Lab Results  Component Value Date   CHOL 182 11/15/2018   HDL 38 (L) 11/15/2018   LDLCALC 117 (H) 11/15/2018   TRIG 134 11/15/2018   CHOLHDL 4.8 11/15/2018   HgbA1c: Lab Results  Component Value Date   HGBA1C 6.9 (H) 11/14/2018   Magnesium:  Magnesium  Date Value Ref Range Status  11/15/2018 1.9 1.7 - 2.4 mg/dL Final    Comment:    Performed at [facility], [place] [address], [place], [place] [address]     Radiology/Studies:  No results found.  Assessment and Plan:   Principal Problem: 1. Acute on chronic systolic CHF (congestive heart failure) ([name]) - Volume and sx improved on Lasix 40 mg IV q 12 hr - should be able to change to po today or tomorrow, pt feels breathing is almost at baseline - wt 163 lbs, down 2 lbs - supplement K+ and follow  2. Hx NICM w/ improved EF, now down to 15% again - likely 2nd recent URI - however, she has CRFs, MD advise if we need to r/o CAD as cause. - could possibly get a cardiac CT, but no one to read it working this weekend.  - is on high-dose BB and ARB, should change to [facility], discuss dose and timing w/ MD as SBP has been < 100 today.  3. Hypokalemia:  - Kdur 40 meq bid ordered by IM  Otherwise, per IM Active Problems:   Hypertension   Hyperlipidemia    Diabetes mellitus without complication ([place])   Hypokalemia     For questions or updates, please contact [place] Please consult [email or web address] for contact info under Cardiology/STEMI.   Signed, Rosaria Ferries, PA-C  11/15/2018 1:43 PM   History and all data above reviewed.  Patient examined.  I agree with the findings as above.  The patient has a previous history of cardiomyopathy.  She had this in the late 90s without evidence of CAD.  She had reported return to normal of her EF although I don't have any old echoes to review.  She had felt well over the years.  However, in late Oct she started to have increased cough and thought she had some bronchitis.  She improved but never really felt right after October.  She had some cough and increased DOE around Thanksgiving.  However, this improved until Christmas  Eve.  She had increase dyspnea and orthopnea.  Weight did increase perhaps 5 lbs in Dec.  No chest pain or edema.  Mild cough.  Now found to have EF of 15% on echo. She is down two liters since admission.  The patient exam reveals COR:RRR  ,  Lungs: Clear  ,  Abd: Positive bowel sounds, no rebound no guarding, Ext No edema, Neck:  No JVD  .  All available labs, radiology testing, previous records reviewed. Agree with documented assessment and plan. Acute systolic HF:  Doubt CAD.  However, she has long standing diabetes.  I will suggest a right and left heart cath to rule out CAD and to obtain pressures.  I am going to reduce her Coreg to 25 mg bid and stop the Plendil and try to get her to [facility] before discharge.  Discussed salt and fluid restriction. Switch to PO Lasix in the AM.     Minus Breeding  3:13 PM  11/15/2018

## 2018-11-15 NOTE — Progress Notes (Addendum)
PROGRESS NOTE    Toni [name]  VQQ:[id number] DOB: 10/03/1956 DOA: 11/13/2018 PCP: Donald Prose, MD   Brief Narrative:  Toni [name] is a 62 y.o. female with medical history significant of HTN; DM; CAD; and systolic CHF presenting with SOB.  For the last month, she has been having intermittent illness.  She has h/o bronchitis and thought maybe this was the problem.  A couple of times this week, she thought she was wheezing and then took an extra Lasix.  Last night, she was having trouble sleeping because of breathing.  Yesterday AM, she got to [employer] for work and couldn't get across the parking lot due to SOB.  She worked all day yesterday and went to the clinic at the end of the day.  Her PCP sent her to ER.  No chest pain.  No edema.  She is part of the [employer] and weighs herself regularly; from last week to this week she is up 10 pounds in 1 week.  +cough, productive of clear and frothy sputum.  No orthopnea.  Just one night of PND.  He has a history of nonischemic cardiomyopathy in 2012 with an EF of 15% but normalized to 50 to 55% in 2017. Was admitted with acute exacerbation of systolic congestive heart failure and echocardiogram obtained today showed an EF of 15%.  The acute nature of her symptoms continues on twice daily IV Lasix.   Assessment & Plan:   Principal Problem:   Acute on chronic systolic CHF (congestive heart failure) ([name]) Active Problems:   Hypokalemia   Hypertension   Hyperlipidemia   Diabetes mellitus without complication ([name])   CHF exacerbation -Patient with prior h/o markedly depressed EF with subsequent improvement now presenting with recurrent SOB and DOE with cough productive of clear/frothy sputum -Echocardiogram shows EF now back down to 15%. -Will start ASA -Will continue ARG and Coreg (she takes a very high dose of Coreg) -CHF order set utilized; may need CHF team consult  -Was given Lasix 40 mg x 1 in ER and will repeat with40 mg IV  BID -Continue [place] O2 prn for now -Normal kidney function at this time, will follow -Repeat EKG this morning shows sinus rhythm with a nonspecific ST-T wave abnormality Check TSH  HTN -She has hypertensive cardiomyopathy with refractory HTN -Continue home meds including Coreg, Plendil, and Diovan  HLD -Continue Zocor -Check lipids  DM -Last A1c was 7.6 in 12/17 -Hold home meds including Farxiga, Glucotrol, Victoza, and Glucophage -Will cover with moderate-scale SSI for now   DVT prophylaxis: Lovenox  Code Status:  Full - confirmed with patient Family Communication: None present Disposition Plan:  Home once clinically improved Consults called:  Cardiology Dr. Radford Pax Admission status:  Observation  Consultants:  [facility] cardiology, Dr. Golden Hurter    Subjective: Patient is feeling somewhat better.  She is sitting up in bed.  She is still somewhat winded.  Objective: Vitals:   11/15/18 0014 11/15/18 0521 11/15/18 0826 11/15/18 1215  BP: 98/66 101/79  (!) 89/63  Pulse: 86 79  84  Resp: 18   17  Temp: 98.1 F (36.7 C) 98.3 F (36.8 C)  98.4 F (36.9 C)  TempSrc: Oral Oral  Oral  SpO2: 97% 99%  100%  Weight:  74.4 kg 74 kg   Height:        Intake/Output Summary (Last 24 hours) at 11/15/2018 1259 Last data filed at 11/15/2018 0924 Gross per 24 hour  Intake  240 ml  Output 2400 ml  Net -2160 ml   Filed Weights   11/14/18 0756 11/15/18 0521 11/15/18 0826  Weight: 75 kg 74.4 kg 74 kg    Examination:  General exam: Appears calm and comfortable  Respiratory system: Clear to auscultation in upper lobes but bibasilar crackles noted. Respiratory effort slightly increased, no edema Cardiovascular system: S1 & S2 heard, RRR. No JVD, murmurs, rubs, gallops or clicks. No pedal edema. Gastrointestinal system: Abdomen is nondistended, soft and nontender. No organomegaly or masses felt. Normal bowel sounds heard. Central nervous system: Alert and oriented. No focal  neurological deficits. Extremities: Symmetric 5 x 5 power. Skin: No rashes, lesions or ulcers Psychiatry: Judgement and insight appear normal. Mood & affect appropriate.     Data Reviewed: I have personally reviewed following labs and imaging studies  CBC: Recent Labs  Lab 11/13/18 2138 11/15/18 0026  WBC 5.9 5.4  NEUTROABS  --  2.4  HGB 13.6 12.5  HCT 43.4 40.0  MCV 91.9 91.5  PLT 315 614   Basic Metabolic Panel: Recent Labs  Lab 11/13/18 2138 11/15/18 0026  NA 141 140  K 3.6 3.2*  CL 106 102  CO2 29 28  GLUCOSE 171* 141*  BUN 14 14  CREATININE 0.76 0.72  CALCIUM 9.3 9.1  MG  --  1.9   Cardiac Enzymes: Recent Labs  Lab 11/14/18 0242  TROPONINI <0.03   BNP (last 3 results) No results for input(s): PROBNP in the last 8760 hours. HbA1C: Recent Labs    11/14/18 0806  HGBA1C 6.9*   CBG: Recent Labs  Lab 11/14/18 1128 11/14/18 1720 11/14/18 2115 11/15/18 0738 11/15/18 1212  GLUCAP 146* 125* 225* 150* 189*   Lipid Profile: Recent Labs    11/15/18 0026  CHOL 182  HDL 38*  LDLCALC 117*  TRIG 134  CHOLHDL 4.8     Radiology Studies: No results found.      Scheduled Meds: . aspirin EC  81 mg Oral Daily  . carvedilol  50 mg Oral BID WC  . enoxaparin (LOVENOX) injection  40 mg Subcutaneous Q24H  . felodipine  2.5 mg Oral Daily  . furosemide  40 mg Intravenous Q12H  . insulin aspart  0-15 Units Subcutaneous TID WC  . insulin aspart  0-5 Units Subcutaneous QHS  . irbesartan  300 mg Oral Daily  . potassium chloride  40 mEq Oral BID  . simvastatin  20 mg Oral QPM  . sodium chloride flush  3 mL Intravenous Q12H   Continuous Infusions: . sodium chloride       LOS: 0 days    Time spent: 42 minutes    Lady Deutscher, MD FACP [facility] Pager [phone]  If 7PM-7AM, please contact night-coverage [email or web address] Password [facility] 11/15/2018, 12:59 PM

## 2018-11-16 DIAGNOSIS — I5043 Acute on chronic combined systolic (congestive) and diastolic (congestive) heart failure: Secondary | ICD-10-CM | POA: Diagnosis not present

## 2018-11-16 DIAGNOSIS — Z90710 Acquired absence of both cervix and uterus: Secondary | ICD-10-CM | POA: Diagnosis not present

## 2018-11-16 DIAGNOSIS — I43 Cardiomyopathy in diseases classified elsewhere: Secondary | ICD-10-CM | POA: Diagnosis present

## 2018-11-16 DIAGNOSIS — Z801 Family history of malignant neoplasm of trachea, bronchus and lung: Secondary | ICD-10-CM | POA: Diagnosis not present

## 2018-11-16 DIAGNOSIS — E782 Mixed hyperlipidemia: Secondary | ICD-10-CM | POA: Diagnosis not present

## 2018-11-16 DIAGNOSIS — I340 Nonrheumatic mitral (valve) insufficiency: Secondary | ICD-10-CM | POA: Diagnosis present

## 2018-11-16 DIAGNOSIS — Z79899 Other long term (current) drug therapy: Secondary | ICD-10-CM | POA: Diagnosis not present

## 2018-11-16 DIAGNOSIS — Z8249 Family history of ischemic heart disease and other diseases of the circulatory system: Secondary | ICD-10-CM | POA: Diagnosis not present

## 2018-11-16 DIAGNOSIS — I2510 Atherosclerotic heart disease of native coronary artery without angina pectoris: Secondary | ICD-10-CM | POA: Diagnosis not present

## 2018-11-16 DIAGNOSIS — E119 Type 2 diabetes mellitus without complications: Secondary | ICD-10-CM | POA: Diagnosis not present

## 2018-11-16 DIAGNOSIS — E876 Hypokalemia: Secondary | ICD-10-CM | POA: Diagnosis not present

## 2018-11-16 DIAGNOSIS — I5082 Biventricular heart failure: Secondary | ICD-10-CM | POA: Diagnosis present

## 2018-11-16 DIAGNOSIS — I272 Pulmonary hypertension, unspecified: Secondary | ICD-10-CM | POA: Diagnosis present

## 2018-11-16 DIAGNOSIS — Z833 Family history of diabetes mellitus: Secondary | ICD-10-CM | POA: Diagnosis not present

## 2018-11-16 DIAGNOSIS — I509 Heart failure, unspecified: Secondary | ICD-10-CM | POA: Diagnosis present

## 2018-11-16 DIAGNOSIS — E118 Type 2 diabetes mellitus with unspecified complications: Secondary | ICD-10-CM | POA: Diagnosis not present

## 2018-11-16 DIAGNOSIS — E785 Hyperlipidemia, unspecified: Secondary | ICD-10-CM | POA: Diagnosis present

## 2018-11-16 DIAGNOSIS — E11649 Type 2 diabetes mellitus with hypoglycemia without coma: Secondary | ICD-10-CM | POA: Diagnosis present

## 2018-11-16 DIAGNOSIS — Z794 Long term (current) use of insulin: Secondary | ICD-10-CM | POA: Diagnosis not present

## 2018-11-16 DIAGNOSIS — I428 Other cardiomyopathies: Secondary | ICD-10-CM | POA: Diagnosis not present

## 2018-11-16 DIAGNOSIS — I110 Hypertensive heart disease with heart failure: Secondary | ICD-10-CM | POA: Diagnosis present

## 2018-11-16 DIAGNOSIS — I5023 Acute on chronic systolic (congestive) heart failure: Secondary | ICD-10-CM | POA: Diagnosis not present

## 2018-11-16 LAB — BASIC METABOLIC PANEL
Anion gap: 11 (ref 5–15)
BUN: 14 mg/dL (ref 8–23)
CO2: 28 mmol/L (ref 22–32)
Calcium: 9.3 mg/dL (ref 8.9–10.3)
Chloride: 102 mmol/L (ref 98–111)
Creatinine, Ser: 0.96 mg/dL (ref 0.44–1.00)
GFR calc Af Amer: >60 mL/min (ref >60)
GLUCOSE: 170 mg/dL — AB (ref 70–99)
Potassium: 4 mmol/L (ref 3.5–5.1)
Sodium: 141 mmol/L (ref 135–145)

## 2018-11-16 LAB — PROTIME-INR
INR: 1.03
Prothrombin Time: 13.4 seconds (ref 11.4–15.2)

## 2018-11-16 LAB — GLUCOSE, CAPILLARY
GLUCOSE-CAPILLARY: 123 mg/dL — AB (ref 70–99)
Glucose-Capillary: 208 mg/dL — ABNORMAL HIGH (ref 70–99)
Glucose-Capillary: 229 mg/dL — ABNORMAL HIGH (ref 70–99)
Glucose-Capillary: 283 mg/dL — ABNORMAL HIGH (ref 70–99)
Glucose-Capillary: 205 mg/dL — ABNORMAL HIGH (ref 70–99)

## 2018-11-16 MED ORDER — SODIUM CHLORIDE 0.9 % IV SOLN: 250.0000 mL | INTRAVENOUS | Status: DC | PRN

## 2018-11-16 MED ORDER — ASPIRIN 81 MG PO CHEW
81.0000 mg | CHEWABLE_TABLET | ORAL | Status: AC
  Administered 2018-11-17: 81 mg via ORAL
  Filled 2018-11-16: qty 1

## 2018-11-16 MED ORDER — SODIUM CHLORIDE 0.9% FLUSH: 3.0000 mL | INTRAVENOUS | Status: DC | PRN

## 2018-11-16 MED ORDER — FUROSEMIDE 40 MG PO TABS
40.0000 mg | ORAL_TABLET | Freq: Every day | ORAL | Status: DC
  Administered 2018-11-16 – 2018-11-19 (×4): 40 mg via ORAL
  Filled 2018-11-16 (×3): qty 1

## 2018-11-16 MED ORDER — POTASSIUM CHLORIDE CRYS ER 20 MEQ PO TBCR
40.0000 meq | EXTENDED_RELEASE_TABLET | Freq: Every day | ORAL | Status: DC
  Administered 2018-11-17 – 2018-11-18 (×2): 40 meq via ORAL
  Filled 2018-11-16 (×2): qty 2

## 2018-11-16 MED ORDER — SODIUM CHLORIDE 0.9% FLUSH
3.0000 mL | Freq: Two times a day (BID) | INTRAVENOUS | Status: DC
  Administered 2018-11-16: 3 mL via INTRAVENOUS

## 2018-11-16 MED ORDER — SODIUM CHLORIDE 0.9 % IV SOLN
INTRAVENOUS | Status: DC
  Administered 2018-11-17: 01:00:00 via INTRAVENOUS

## 2018-11-16 NOTE — H&P (View-Only) (Signed)
Progress Note  Patient Name: Toni [name] Date of Encounter: 11/16/2018  Primary Cardiologist:  Belva Crome III, MD  Subjective   No chest pain, SOB at baseline, feels well.  Inpatient Medications    Scheduled Meds: . aspirin EC  81 mg Oral Daily  . carvedilol  25 mg Oral BID WC  . enoxaparin (LOVENOX) injection  40 mg Subcutaneous Q24H  . furosemide  40 mg Intravenous Q12H  . insulin aspart  0-15 Units Subcutaneous TID WC  . insulin aspart  0-5 Units Subcutaneous QHS  . irbesartan  300 mg Oral Daily  . potassium chloride  40 mEq Oral BID  . simvastatin  20 mg Oral QPM  . sodium chloride flush  3 mL Intravenous Q12H   Continuous Infusions: . sodium chloride     PRN Meds: sodium chloride, acetaminophen, ondansetron (ZOFRAN) IV, sodium chloride flush   Vital Signs    Vitals:   11/15/18 1215 11/15/18 1911 11/15/18 2327 11/16/18 0549  BP: (!) 89/63 112/78 98/71 113/81  Pulse: 84 84 76 75  Resp: 17 18 18 18   Temp: 98.4 F (36.9 C) 98.1 F (36.7 C) 98.4 F (36.9 C) 98.2 F (36.8 C)  TempSrc: Oral Oral Oral Oral  SpO2: 100% 99% 100% 98%  Weight:    74.2 kg  Height:        Intake/Output Summary (Last 24 hours) at 11/16/2018 0945 Last data filed at 11/16/2018 4174 Gross per 24 hour  Intake 700 ml  Output 2900 ml  Net -2200 ml   Filed Weights   11/15/18 0521 11/15/18 0826 11/16/18 0549  Weight: 74.4 kg 74 kg 74.2 kg    Telemetry    SR - Personally Reviewed  ECG    No ectopy - Personally Reviewed  Physical Exam   General: Well developed, well nourished, female appearing in no acute distress. Head: Normocephalic, atraumatic.  Neck: Supple without bruits, JVD not elevated. Lungs:  Resp regular and unlabored, CTA. Heart: RRR, S1, S2, no S3, S4, or murmur; no rub. Abdomen: Soft, non-tender, non-distended with normoactive bowel sounds. No hepatomegaly. No rebound/guarding. No obvious abdominal masses. Extremities: No clubbing, cyanosis, no edema.  Distal pedal pulses are 2+ bilaterally. Neuro: Alert and oriented X 3. Moves all extremities spontaneously. Psych: Normal affect.  Labs    Hematology Recent Labs  Lab 11/13/18 2138 11/15/18 0026  WBC 5.9 5.4  RBC 4.72 4.37  HGB 13.6 12.5  HCT 43.4 40.0  MCV 91.9 91.5  MCH 28.8 28.6  MCHC 31.3 31.3  RDW 13.6 13.7  PLT 315 266    Chemistry Recent Labs  Lab 11/13/18 2138 11/15/18 0026 11/16/18 0528  NA 141 140 141  K 3.6 3.2* 4.0  CL 106 102 102  CO2 29 28 28   GLUCOSE 171* 141* 170*  BUN 14 14 14   CREATININE 0.76 0.72 0.96  CALCIUM 9.3 9.1 9.3  GFRNONAA >60 >60 >60  GFRAA >60 >60 >60  ANIONGAP 6 10 11      Cardiac Enzymes Recent Labs  Lab 11/14/18 0242  TROPONINI <0.03      BNP Recent Labs  Lab 11/14/18 0242  BNP 520.0*     Radiology    No results found.   Cardiac Studies   ECHO:  11/14/2018 - Left ventricle: The cavity size was moderately dilated. Systolic   function was severely reduced. The estimated ejection fraction   was 15%. Diffuse, severe hypokinesis. Doppler parameters are   consistent with a reversible restrictive  pattern, indicative of   decreased left ventricular diastolic compliance and/or increased   left atrial pressure (grade 3 diastolic dysfunction). Doppler   parameters are consistent with high ventricular filling pressure. - Aortic valve: Transvalvular velocity was within the normal range.   There was no stenosis. There was trivial regurgitation. - Mitral valve: There was moderate, central regurgitation, likely   secondary MR due to leaflet tenting. Valve area by continuity   equation (using LVOT flow): 0.92 cm^2. - Left atrium: The atrium was moderately dilated. - Right ventricle: The cavity size was normal. Wall thickness was   normal. Systolic function was mildly reduced. RV systolic   pressure (S, est): 55 mm Hg. - Right atrium: The atrium was mildly dilated. Central venous   pressure (est): 15 mm Hg. - Atrial  septum: No defect or patent foramen ovale was identified. - Tricuspid valve: There was mild regurgitation. - Pulmonary arteries: Systolic pressure was moderately to severely   increased. PA peak pressure: 55 mm Hg (S). - Inferior vena cava: The vessel was dilated. The respirophasic   diameter changes were blunted (< 50%), consistent with elevated   central venous pressure.  Impressions:  - Severely reduced LV function with LVEF 15%. Moderate, central   mitral regurgitation likely secondary MR due to LV dysfunction.  Patient Profile     62 y.o. female w/ hx of minimal CAD, S-CHF>>nl EF by 2012, NICM, DM, HTN, HLD, who was admitted 12/27 for CHF exacerbation.  Assessment & Plan    Principal Problem: 1.  Acute on chronic systolic CHF (congestive heart failure) ([place]) - pt doing well. - think ok to change to po Lasix, was gaining fluid on 20 mg qd, will increase home dose to 40 mg qd - continue to follow daily wts, I/O, BMET - renal function stable  2. Cardiomyopathy - prev was NICM, EF had normalized by 2012 - EF now low, suspect 2nd recent URI, but needs R/L heart cath - on the board for tomorrow, orders written - on high-dose BB, ARB.  - d/c ARB, start Entresto 49-51 in am - pt is willing to consider ICD if EF does not improve, MD advise on LifeVest  Otherwise, per IM Active Problems:   Hypertension   Hyperlipidemia   Diabetes mellitus without complication ([place])   Hypokalemia    Signed, Rosaria Ferries , PA-C 9:45 AM 11/16/2018 Pager: [phone]  I have seen and examined the patient along with Rosaria Ferries , PA-C.  I have reviewed the chart, notes and new data.  I agree with PA's note.  Key new complaints: Feels much better.  Walking in the hallway without any difficulty.  No dyspnea. Key examination changes: No murmurs, regular rate and rhythm, displaced apical impulse, anterior axillary line sixth intercostal space.  Loud S2. Key new findings / data: Echo  from 12/27 showed moderately dilated LV with EF 15% with diffuse hypokinesis, evidence of restrictive filling, moderately severe pulmonary hypertension, signs of elevated left atrial and right atrial pressure.  ECG shows narrow QRS complex.  PLAN: For right and left heart catheterization tomorrow.  Physical exam suggest that the filling pressures are now normal. Switch from irbesartan to Entresto. Otherwise she is already on state-of-the-art chronic heart failure therapy with high-dose carvedilol, farxiga.  Consider adding spironolactone once maximized on Entresto. Reevaluate left ventricular systolic function in roughly 3 months and discuss ICD if EF still less than 35%.  Sanda Klein, MD, [place] [phone] 11/16/2018, 11:10 AM

## 2018-11-16 NOTE — Progress Notes (Signed)
PROGRESS NOTE    KEIERA STRATHMAN  JSE:[id number] DOB: 1955/12/31 DOA: 11/13/2018 PCP: Donald Prose, MD   Brief Narrative:  Toni Joaquin Averyis a 62 y.o.femalewith medical history significant ofHTN; DM; CAD; and systolic CHF presenting with SOB.For the last month, she has been having intermittent illness. She has h/o bronchitis and thought maybe this was the problem. A couple of times this week, she thought she was wheezing and then took an extra Lasix. Last night, she was having trouble sleeping because of breathing. Yesterday AM, she got to [facility] for work and couldn't get across the parking lot due to SOB. She worked all day yesterday and went to the clinic at the end of the day. Her PCP sent her to ER. No chest pain. No edema. She is part of the [employer] and weighs herself regularly; from last week to this week she is up 10 pounds in 1 week. +cough, productive of clear and frothy sputum. No orthopnea. Just one night of PND.  He has a history of nonischemic cardiomyopathy in 2012 with an EF of 15% but normalized to 50 to 55% in 2017. Was admitted with acute exacerbation of systolic congestive heart failure and echocardiogram obtained today showed an EF of 15%.    Due to the acute nature of her symptoms continues on twice daily IV Lasix.  Cardiology has seen the patient and is recommending cardiac catheterization in a.m.   Assessment & Plan:   Principal Problem:   Acute on chronic systolic CHF (congestive heart failure) ([name]) Active Problems:   Hypokalemia   Hypertension   Hyperlipidemia   Diabetes mellitus without complication ([name])   CHF exacerbation -Patient withprior h/o markedly depressed EF with subsequent improvement nowpresenting with recurrent SOB and DOE with cough productive of clear/frothy sputum -Echocardiogram shows EF now back down to 15%. -Will start ASA -Willcontinue ARB and Coreg (she takes a very high dose of Coreg); may benefit from [place]  at discharge -CHF order set utilized; may need CHF team consult  -Continue Lasix 40 IV twice daily -Continue [place] O2prnfor now -Normal kidney function at this time, will follow -Repeat EKG  after admission shows sinus rhythm with a nonspecific ST-T wave abnormality TSH was unremarkable  HTN -She has hypertensive cardiomyopathy with refractory HTN -Continue home meds including Coreg, Plendil, and Diovan  HLD -ContinueZocor -Check lipids -Cardiac cath tomorrow  DM -Last A1c was7.6 in 12/17 -Hold home meds including Farxiga, Glucotrol, Victoza, and Glucophage -Will cover withmoderate-scaleSSI for now   DVT prophylaxis:Lovenox  Code Status:Full- confirmed with patient Family Communication:None present Disposition Plan:Home once clinically improved Consults called: Cardiology Dr. Radford Pax Admission status:  Convert to inpatient from observation  Consultants:  [facility] cardiology, Dr. Golden Hurter  Subjective: Patient feeling some better.  Energy is starting to improve.  She is -3700 mL  Objective: Vitals:   11/15/18 1911 11/15/18 2327 11/16/18 0549 11/16/18 1221  BP: 112/78 98/71 113/81 94/71  Pulse: 84 76 75 83  Resp: 18 18 18 18   Temp: 98.1 F (36.7 C) 98.4 F (36.9 C) 98.2 F (36.8 C) 98.3 F (36.8 C)  TempSrc: Oral Oral Oral Oral  SpO2: 99% 100% 98% 97%  Weight:   74.2 kg   Height:        Intake/Output Summary (Last 24 hours) at 11/16/2018 1419 Last data filed at 11/16/2018 1107 Gross per 24 hour  Intake 760 ml  Output 1900 ml  Net -1140 ml   Filed Weights   11/15/18 0521  11/15/18 0826 11/16/18 0549  Weight: 74.4 kg 74 kg 74.2 kg    Examination:  General exam: Appears calm and comfortable  Respiratory system: Clear to auscultation. Respiratory effort normal. Cardiovascular system: S1 & S2 heard, RRR. No JVD, murmurs, rubs, gallops or clicks. No pedal edema. Gastrointestinal system: Abdomen is nondistended, soft and nontender. No  organomegaly or masses felt. Normal bowel sounds heard. Central nervous system: Alert and oriented. No focal neurological deficits. Extremities: Symmetric 5 x 5 power. Skin: No rashes, lesions or ulcers Psychiatry: Judgement and insight appear normal. Mood & affect appropriate.     Data Reviewed: I have personally reviewed following labs and imaging studies  CBC: Recent Labs  Lab 11/13/18 2138 11/15/18 0026  WBC 5.9 5.4  NEUTROABS  --  2.4  HGB 13.6 12.5  HCT 43.4 40.0  MCV 91.9 91.5  PLT 315 572   Basic Metabolic Panel: Recent Labs  Lab 11/13/18 2138 11/15/18 0026 11/16/18 0528  NA 141 140 141  K 3.6 3.2* 4.0  CL 106 102 102  CO2 29 28 28   GLUCOSE 171* 141* 170*  BUN 14 14 14   CREATININE 0.76 0.72 0.96  CALCIUM 9.3 9.1 9.3  MG  --  1.9  --    GFR: Estimated Creatinine Clearance: 61.2 mL/min (by C-G formula based on SCr of 0.96 mg/dL). Liver Function Tests: No results for input(s): AST, ALT, ALKPHOS, BILITOT, PROT, ALBUMIN in the last 168 hours. No results for input(s): LIPASE, AMYLASE in the last 168 hours. No results for input(s): AMMONIA in the last 168 hours. Coagulation Profile: Recent Labs  Lab 11/16/18 0528  INR 1.03   Cardiac Enzymes: Recent Labs  Lab 11/14/18 0242  TROPONINI <0.03   BNP (last 3 results) No results for input(s): PROBNP in the last 8760 hours. HbA1C: Recent Labs    11/14/18 0806  HGBA1C 6.9*   CBG: Recent Labs  Lab 11/15/18 1212 11/15/18 1651 11/15/18 2110 11/16/18 0742 11/16/18 1218  GLUCAP 189* 173* 193* 208* 229*   Lipid Profile: Recent Labs    11/15/18 0026  CHOL 182  HDL 38*  LDLCALC 117*  TRIG 134  CHOLHDL 4.8   Thyroid Function Tests: Recent Labs    11/15/18 1311  TSH 0.637   Anemia Panel: No results for input(s): VITAMINB12, FOLATE, FERRITIN, TIBC, IRON, RETICCTPCT in the last 72 hours. Sepsis Labs: No results for input(s): PROCALCITON, LATICACIDVEN in the last 168 hours.  No results found  for this or any previous visit (from the past 240 hour(s)).       Radiology Studies: No results found.      Scheduled Meds: . aspirin EC  81 mg Oral Daily  . carvedilol  25 mg Oral BID WC  . enoxaparin (LOVENOX) injection  40 mg Subcutaneous Q24H  . furosemide  40 mg Oral Daily  . insulin aspart  0-15 Units Subcutaneous TID WC  . insulin aspart  0-5 Units Subcutaneous QHS  . [START ON 11/17/2018] potassium chloride  40 mEq Oral Daily  . simvastatin  20 mg Oral QPM  . sodium chloride flush  3 mL Intravenous Q12H   Continuous Infusions: . sodium chloride       LOS: 0 days    Time spent: 38 minutes    Lady Deutscher, MD FACP [facility] Pager [phone]  If 7PM-7AM, please contact night-coverage [email or web address] Password [email or web address] 11/16/2018, 2:19 PM

## 2018-11-16 NOTE — Progress Notes (Addendum)
Progress Note  Patient Name: Toni [name] Date of Encounter: 11/16/2018  Primary Cardiologist:  Belva Crome III, MD  Subjective   No chest pain, SOB at baseline, feels well.  Inpatient Medications    Scheduled Meds: . aspirin EC  81 mg Oral Daily  . carvedilol  25 mg Oral BID WC  . enoxaparin (LOVENOX) injection  40 mg Subcutaneous Q24H  . furosemide  40 mg Intravenous Q12H  . insulin aspart  0-15 Units Subcutaneous TID WC  . insulin aspart  0-5 Units Subcutaneous QHS  . irbesartan  300 mg Oral Daily  . potassium chloride  40 mEq Oral BID  . simvastatin  20 mg Oral QPM  . sodium chloride flush  3 mL Intravenous Q12H   Continuous Infusions: . sodium chloride     PRN Meds: sodium chloride, acetaminophen, ondansetron (ZOFRAN) IV, sodium chloride flush   Vital Signs    Vitals:   11/15/18 1215 11/15/18 1911 11/15/18 2327 11/16/18 0549  BP: (!) 89/63 112/78 98/71 113/81  Pulse: 84 84 76 75  Resp: 17 18 18 18   Temp: 98.4 F (36.9 C) 98.1 F (36.7 C) 98.4 F (36.9 C) 98.2 F (36.8 C)  TempSrc: Oral Oral Oral Oral  SpO2: 100% 99% 100% 98%  Weight:    74.2 kg  Height:        Intake/Output Summary (Last 24 hours) at 11/16/2018 0945 Last data filed at 11/16/2018 6761 Gross per 24 hour  Intake 700 ml  Output 2900 ml  Net -2200 ml   Filed Weights   11/15/18 0521 11/15/18 0826 11/16/18 0549  Weight: 74.4 kg 74 kg 74.2 kg    Telemetry    SR - Personally Reviewed  ECG    No ectopy - Personally Reviewed  Physical Exam   General: Well developed, well nourished, female appearing in no acute distress. Head: Normocephalic, atraumatic.  Neck: Supple without bruits, JVD not elevated. Lungs:  Resp regular and unlabored, CTA. Heart: RRR, S1, S2, no S3, S4, or murmur; no rub. Abdomen: Soft, non-tender, non-distended with normoactive bowel sounds. No hepatomegaly. No rebound/guarding. No obvious abdominal masses. Extremities: No clubbing, cyanosis, no edema.  Distal pedal pulses are 2+ bilaterally. Neuro: Alert and oriented X 3. Moves all extremities spontaneously. Psych: Normal affect.  Labs    Hematology Recent Labs  Lab 11/13/18 2138 11/15/18 0026  WBC 5.9 5.4  RBC 4.72 4.37  HGB 13.6 12.5  HCT 43.4 40.0  MCV 91.9 91.5  MCH 28.8 28.6  MCHC 31.3 31.3  RDW 13.6 13.7  PLT 315 266    Chemistry Recent Labs  Lab 11/13/18 2138 11/15/18 0026 11/16/18 0528  NA 141 140 141  K 3.6 3.2* 4.0  CL 106 102 102  CO2 29 28 28   GLUCOSE 171* 141* 170*  BUN 14 14 14   CREATININE 0.76 0.72 0.96  CALCIUM 9.3 9.1 9.3  GFRNONAA >60 >60 >60  GFRAA >60 >60 >60  ANIONGAP 6 10 11      Cardiac Enzymes Recent Labs  Lab 11/14/18 0242  TROPONINI <0.03      BNP Recent Labs  Lab 11/14/18 0242  BNP 520.0*     Radiology    No results found.   Cardiac Studies   ECHO:  11/14/2018 - Left ventricle: The cavity size was moderately dilated. Systolic   function was severely reduced. The estimated ejection fraction   was 15%. Diffuse, severe hypokinesis. Doppler parameters are   consistent with a reversible restrictive  pattern, indicative of   decreased left ventricular diastolic compliance and/or increased   left atrial pressure (grade 3 diastolic dysfunction). Doppler   parameters are consistent with high ventricular filling pressure. - Aortic valve: Transvalvular velocity was within the normal range.   There was no stenosis. There was trivial regurgitation. - Mitral valve: There was moderate, central regurgitation, likely   secondary MR due to leaflet tenting. Valve area by continuity   equation (using LVOT flow): 0.92 cm^2. - Left atrium: The atrium was moderately dilated. - Right ventricle: The cavity size was normal. Wall thickness was   normal. Systolic function was mildly reduced. RV systolic   pressure (S, est): 55 mm Hg. - Right atrium: The atrium was mildly dilated. Central venous   pressure (est): 15 mm Hg. - Atrial  septum: No defect or patent foramen ovale was identified. - Tricuspid valve: There was mild regurgitation. - Pulmonary arteries: Systolic pressure was moderately to severely   increased. PA peak pressure: 55 mm Hg (S). - Inferior vena cava: The vessel was dilated. The respirophasic   diameter changes were blunted (< 50%), consistent with elevated   central venous pressure.  Impressions:  - Severely reduced LV function with LVEF 15%. Moderate, central   mitral regurgitation likely secondary MR due to LV dysfunction.  Patient Profile     62 y.o. female w/ hx of minimal CAD, S-CHF>>nl EF by 2012, NICM, DM, HTN, HLD, who was admitted 12/27 for CHF exacerbation.  Assessment & Plan    Principal Problem: 1.  Acute on chronic systolic CHF (congestive heart failure) ([place]) - pt doing well. - think ok to change to po Lasix, was gaining fluid on 20 mg qd, will increase home dose to 40 mg qd - continue to follow daily wts, I/O, BMET - renal function stable  2. Cardiomyopathy - prev was NICM, EF had normalized by 2012 - EF now low, suspect 2nd recent URI, but needs R/L heart cath - on the board for tomorrow, orders written - on high-dose BB, ARB.  - d/c ARB, start Entresto 49-51 in am - pt is willing to consider ICD if EF does not improve, MD advise on LifeVest  Otherwise, per IM Active Problems:   Hypertension   Hyperlipidemia   Diabetes mellitus without complication ([place])   Hypokalemia    Signed, Rosaria Ferries , PA-C 9:45 AM 11/16/2018 Pager: [phone]  I have seen and examined the patient along with Rosaria Ferries , PA-C.  I have reviewed the chart, notes and new data.  I agree with PA's note.  Key new complaints: Feels much better.  Walking in the hallway without any difficulty.  No dyspnea. Key examination changes: No murmurs, regular rate and rhythm, displaced apical impulse, anterior axillary line sixth intercostal space.  Loud S2. Key new findings / data: Echo  from 12/27 showed moderately dilated LV with EF 15% with diffuse hypokinesis, evidence of restrictive filling, moderately severe pulmonary hypertension, signs of elevated left atrial and right atrial pressure.  ECG shows narrow QRS complex.  PLAN: For right and left heart catheterization tomorrow.  Physical exam suggest that the filling pressures are now normal. Switch from irbesartan to Entresto. Otherwise she is already on state-of-the-art chronic heart failure therapy with high-dose carvedilol, farxiga.  Consider adding spironolactone once maximized on Entresto. Reevaluate left ventricular systolic function in roughly 3 months and discuss ICD if EF still less than 35%.  Sanda Klein, MD, [place] [phone] 11/16/2018, 11:10 AM

## 2018-11-17 ENCOUNTER — Encounter (HOSPITAL_COMMUNITY)
Admission: EM | Disposition: A | Discharge status: Home / Self Care | Payer: Self-pay | Source: Home / Self Care | Attending: Internal Medicine

## 2018-11-17 ENCOUNTER — Encounter (HOSPITAL_COMMUNITY): Payer: Self-pay | Admitting: Interventional Cardiology

## 2018-11-17 DIAGNOSIS — I2510 Atherosclerotic heart disease of native coronary artery without angina pectoris: Secondary | ICD-10-CM

## 2018-11-17 HISTORY — PX: RIGHT/LEFT HEART CATH AND CORONARY ANGIOGRAPHY: CATH118266

## 2018-11-17 LAB — BASIC METABOLIC PANEL
Anion gap: 10 (ref 5–15)
BUN: 17 mg/dL (ref 8–23)
CO2: 23 mmol/L (ref 22–32)
Calcium: 9.3 mg/dL (ref 8.9–10.3)
Chloride: 106 mmol/L (ref 98–111)
Creatinine, Ser: 0.7 mg/dL (ref 0.44–1.00)
GFR calc Af Amer: >60 mL/min (ref >60)
GFR calc non Af Amer: >60 mL/min (ref >60)
Glucose, Bld: 160 mg/dL — ABNORMAL HIGH (ref 70–99)
Potassium: 4.3 mmol/L (ref 3.5–5.1)
Sodium: 139 mmol/L (ref 135–145)

## 2018-11-17 LAB — CBC
HCT: 42.1 % (ref 36.0–46.0)
Hemoglobin: 13.6 g/dL (ref 12.0–15.0)
MCH: 29.6 pg (ref 26.0–34.0)
MCHC: 32.3 g/dL (ref 30.0–36.0)
MCV: 91.7 fL (ref 80.0–100.0)
Platelets: 228 10*3/uL (ref 150–400)
RBC: 4.59 MIL/uL (ref 3.87–5.11)
RDW: 13.6 % (ref 11.5–15.5)
WBC: 5.3 10*3/uL (ref 4.0–10.5)
nRBC: 0 % (ref 0.0–0.2)

## 2018-11-17 LAB — POCT I-STAT 3, ART BLOOD GAS (G3+)
Acid-base deficit: 4 mmol/L — ABNORMAL HIGH (ref 0.0–2.0)
Bicarbonate: 22 mmol/L (ref 20.0–28.0)
O2 Saturation: 93 %
TCO2: 23 mmol/L (ref 22–32)
pCO2 arterial: 43.9 mmHg (ref 32.0–48.0)
pH, Arterial: 7.307 — ABNORMAL LOW (ref 7.350–7.450)
pO2, Arterial: 74 mmHg — ABNORMAL LOW (ref 83.0–108.0)

## 2018-11-17 LAB — POCT I-STAT 3, VENOUS BLOOD GAS (G3P V)
ACID-BASE DEFICIT: 1 mmol/L (ref 0.0–2.0)
Bicarbonate: 25.5 mmol/L (ref 20.0–28.0)
O2 Saturation: 66 %
TCO2: 27 mmol/L (ref 22–32)
pCO2, Ven: 47.9 mmHg (ref 44.0–60.0)
pH, Ven: 7.335 (ref 7.250–7.430)
pO2, Ven: 37 mmHg (ref 32.0–45.0)

## 2018-11-17 LAB — GLUCOSE, CAPILLARY
Glucose-Capillary: 158 mg/dL — ABNORMAL HIGH (ref 70–99)
Glucose-Capillary: 140 mg/dL — ABNORMAL HIGH (ref 70–99)
Glucose-Capillary: 270 mg/dL — ABNORMAL HIGH (ref 70–99)
Glucose-Capillary: 188 mg/dL — ABNORMAL HIGH (ref 70–99)

## 2018-11-17 SURGERY — RIGHT/LEFT HEART CATH AND CORONARY ANGIOGRAPHY
Anesthesia: LOCAL

## 2018-11-17 MED ORDER — MIDAZOLAM HCL 2 MG/2ML IJ SOLN
INTRAMUSCULAR | Status: DC | PRN
  Administered 2018-11-17: 1 mg via INTRAVENOUS

## 2018-11-17 MED ORDER — LIDOCAINE HCL (PF) 1 % IJ SOLN
INTRAMUSCULAR | Status: AC
  Filled 2018-11-17: qty 30

## 2018-11-17 MED ORDER — ONDANSETRON HCL 4 MG/2ML IJ SOLN: 4.0000 mg | Freq: Four times a day (QID) | INTRAMUSCULAR | Status: DC | PRN

## 2018-11-17 MED ORDER — FENTANYL CITRATE (PF) 100 MCG/2ML IJ SOLN
INTRAMUSCULAR | Status: AC
  Filled 2018-11-17: qty 2

## 2018-11-17 MED ORDER — VERAPAMIL HCL 2.5 MG/ML IV SOLN
INTRAVENOUS | Status: AC
  Filled 2018-11-17: qty 2

## 2018-11-17 MED ORDER — HEPARIN (PORCINE) IN NACL 1000-0.9 UT/500ML-% IV SOLN
INTRAVENOUS | Status: AC
  Filled 2018-11-17: qty 1000

## 2018-11-17 MED ORDER — ENOXAPARIN SODIUM 40 MG/0.4ML ~~LOC~~ SOLN
40.0000 mg | SUBCUTANEOUS | Status: DC
  Administered 2018-11-18 – 2018-11-19 (×2): 40 mg via SUBCUTANEOUS
  Filled 2018-11-17 (×2): qty 0.4

## 2018-11-17 MED ORDER — HEPARIN SODIUM (PORCINE) 1000 UNIT/ML IJ SOLN
INTRAMUSCULAR | Status: AC
  Filled 2018-11-17: qty 1

## 2018-11-17 MED ORDER — SODIUM CHLORIDE 0.9 % IV SOLN: 250.0000 mL | INTRAVENOUS | Status: DC | PRN

## 2018-11-17 MED ORDER — ASPIRIN 81 MG PO CHEW
81.0000 mg | CHEWABLE_TABLET | Freq: Every day | ORAL | Status: DC
  Administered 2018-11-17 – 2018-11-19 (×3): 81 mg via ORAL
  Filled 2018-11-17 (×2): qty 1

## 2018-11-17 MED ORDER — HEPARIN SODIUM (PORCINE) 1000 UNIT/ML IJ SOLN
INTRAMUSCULAR | Status: DC | PRN
  Administered 2018-11-17: 4000 [IU] via INTRAVENOUS

## 2018-11-17 MED ORDER — IOHEXOL 350 MG/ML SOLN
INTRAVENOUS | Status: DC | PRN
  Administered 2018-11-17: 80 mL via INTRA_ARTERIAL

## 2018-11-17 MED ORDER — SODIUM CHLORIDE 0.9% FLUSH
3.0000 mL | Freq: Two times a day (BID) | INTRAVENOUS | Status: DC
  Administered 2018-11-18 – 2018-11-19 (×2): 3 mL via INTRAVENOUS

## 2018-11-17 MED ORDER — LIDOCAINE HCL (PF) 1 % IJ SOLN
INTRAMUSCULAR | Status: DC | PRN
  Administered 2018-11-17 (×2): 2 mL via INTRADERMAL

## 2018-11-17 MED ORDER — MIDAZOLAM HCL 2 MG/2ML IJ SOLN
INTRAMUSCULAR | Status: AC
  Filled 2018-11-17: qty 2

## 2018-11-17 MED ORDER — HEPARIN (PORCINE) IN NACL 1000-0.9 UT/500ML-% IV SOLN
INTRAVENOUS | Status: DC | PRN
  Administered 2018-11-17 (×2): 500 mL

## 2018-11-17 MED ORDER — SODIUM CHLORIDE 0.9% FLUSH: 3.0000 mL | INTRAVENOUS | Status: DC | PRN

## 2018-11-17 MED ORDER — ACETAMINOPHEN 325 MG PO TABS: 650.0000 mg | ORAL_TABLET | ORAL | Status: DC | PRN

## 2018-11-17 MED ORDER — FENTANYL CITRATE (PF) 100 MCG/2ML IJ SOLN
INTRAMUSCULAR | Status: DC | PRN
  Administered 2018-11-17: 25 ug via INTRAVENOUS

## 2018-11-17 MED ORDER — VERAPAMIL HCL 2.5 MG/ML IV SOLN
INTRAVENOUS | Status: DC | PRN
  Administered 2018-11-17: 10 mL via INTRA_ARTERIAL

## 2018-11-17 SURGICAL SUPPLY — 15 items
CATH BALLN WEDGE 5F 110CM (CATHETERS) ×1 IMPLANT
CATH INFINITI 5 FR JL3.5 (CATHETERS) ×1 IMPLANT
CATH INFINITI JR4 5F (CATHETERS) ×1 IMPLANT
CATH LAUNCHER 5F EBU3.0 (CATHETERS) IMPLANT
CATHETER LAUNCHER 5F EBU3.0 (CATHETERS) ×2
DEVICE RAD COMP TR BAND LRG (VASCULAR PRODUCTS) ×1 IMPLANT
GLIDESHEATH SLEND A-KIT 6F 22G (SHEATH) ×1 IMPLANT
GUIDEWIRE INQWIRE 1.5J.035X260 (WIRE) IMPLANT
INQWIRE 1.5J .035X260CM (WIRE) ×2
KIT HEART LEFT (KITS) ×2 IMPLANT
PACK CARDIAC CATHETERIZATION (CUSTOM PROCEDURE TRAY) ×2 IMPLANT
SHEATH GLIDE SLENDER 4/5FR (SHEATH) ×1 IMPLANT
SHEATH PROBE COVER 6X72 (BAG) ×1 IMPLANT
TRANSDUCER W/STOPCOCK (MISCELLANEOUS) ×2 IMPLANT
TUBING CIL FLEX 10 FLL-RA (TUBING) ×2 IMPLANT

## 2018-11-17 NOTE — Progress Notes (Signed)
[facility] Progress Note  Patient: Toni [name] HAL:[id number]   PCP: Donald Prose, MD DOB: 29-Aug-1956   DOA: 11/13/2018   DOS: 11/17/2018   Date of Service: the patient was seen and examined on 11/17/2018  Brief hospital course: Pt. with PMH of of HTN; DM; CAD; and systolic CHF; admitted on 11/13/2018, presented with complaint of dose of breath, was found to have acute on chronic diastolic and combined systolic CHF.  Underwent right and left cardiac catheterization. Currently further plan is follow-up on recommendation from advanced heart failure team.  Subjective: Feeling better, still short of breath.  No nausea no vomiting no chest pain.  No diarrhea.  Still has cough at night.  Assessment and Plan: 1.  Acute on chronic combined CHF. Echocardiogram on admission suggest 15% EF with grade 2 diastolic dysfunction. Restrictive pattern cardiomyopathy. Left heart catheterization on 11/17/2018, 70 to 80% OD, 40% LAD.  Moderate to severe pulmonary hypertension. Cardiology was consulted, currently advanced heart failure team is consulted will follow up on the recommendation. Currently on Lasix and Coreg, 81 mg aspirin Per cardiology will benefit from Entresto and Aldactone.  2.  Type 2 diabetes mellitus. Control with hypoglycemia. Home regimen includes glipizide 10 mg twice daily,Farixga 5 mg daily, metformin 1000 mg twice daily, Victoza Hemoglobin A1c 6.9. Continue sliding scale insulin.  3.  HTN. Blood pressure relatively under control. We will monitor on current regimen. Further adjustment per cardiology.  4.  Dyslipidemia. Continue statin and monitor.   Diet: Cardiac and carb modified diet DVT Prophylaxis: subcutaneous Heparin  Advance goals of care discussion: home  Family Communication: no family was present at bedside, at the time of interview.   Disposition:  Discharge to home.  Consultants: cardiology, CHF team Procedures: Echocardiogram  Cardiac  Catheterization   Scheduled Meds: . aspirin  81 mg Oral Daily  . carvedilol  25 mg Oral BID WC  . [START ON 11/18/2018] enoxaparin (LOVENOX) injection  40 mg Subcutaneous Q24H  . furosemide  40 mg Oral Daily  . insulin aspart  0-15 Units Subcutaneous TID WC  . insulin aspart  0-5 Units Subcutaneous QHS  . potassium chloride  40 mEq Oral Daily  . simvastatin  20 mg Oral QPM  . sodium chloride flush  3 mL Intravenous Q12H  . sodium chloride flush  3 mL Intravenous Q12H   Continuous Infusions: . sodium chloride     PRN Meds: sodium chloride, acetaminophen, ondansetron (ZOFRAN) IV, sodium chloride flush, sodium chloride flush Antibiotics: Anti-infectives (From admission, onward)   None       Objective: Physical Exam: Vitals:   11/17/18 0949 11/17/18 0954 11/17/18 0958 11/17/18 1055  BP: 125/81 130/86 (!) 134/94 109/70  Pulse: 85 86 86 81  Resp: (!) 22 15 15 18   Temp:    97.8 F (36.6 C)  TempSrc:    Oral  SpO2: 100% 100% 100% 96%  Weight:      Height:        Intake/Output Summary (Last 24 hours) at 11/17/2018 1726 Last data filed at 11/17/2018 0600 Gross per 24 hour  Intake 351.75 ml  Output 1200 ml  Net -848.25 ml   Filed Weights   11/15/18 0826 11/16/18 0549 11/17/18 0404  Weight: 74 kg 74.2 kg 74.7 kg   General: Alert, Awake and Oriented to Time, Place and Person. Appear in moderate distress, affect appropriate Eyes: PERRL, Conjunctiva normal ENT: Oral Mucosa clear moist. Neck: no JVD, no Abnormal Mass Or lumps Cardiovascular: S1 and  S2 Present, no Murmur, Peripheral Pulses Present Respiratory: normal respiratory effort, Bilateral Air entry equal and Decreased, no use of accessory muscle, Clear to Auscultation, no Crackles, no wheezes Abdomen: Bowel Sound present, Soft and no tenderness, no hernia Skin: no redness, no Rash, no induration Extremities: bilateral  Pedal edema, no calf tenderness Neurologic: Grossly no focal neuro deficit. Bilaterally Equal motor  strength  Data Reviewed: CBC: Recent Labs  Lab 11/13/18 2138 11/15/18 0026 11/17/18 1101  WBC 5.9 5.4 5.3  NEUTROABS  --  2.4  --   HGB 13.6 12.5 13.6  HCT 43.4 40.0 42.1  MCV 91.9 91.5 91.7  PLT 315 266 472   Basic Metabolic Panel: Recent Labs  Lab 11/13/18 2138 11/15/18 0026 11/16/18 0528 11/17/18 0441  NA 141 140 141 139  K 3.6 3.2* 4.0 4.3  CL 106 102 102 106  CO2 29 28 28 23   GLUCOSE 171* 141* 170* 160*  BUN 14 14 14 17   CREATININE 0.76 0.72 0.96 0.70  CALCIUM 9.3 9.1 9.3 9.3  MG  --  1.9  --   --     Liver Function Tests: No results for input(s): AST, ALT, ALKPHOS, BILITOT, PROT, ALBUMIN in the last 168 hours. No results for input(s): LIPASE, AMYLASE in the last 168 hours. No results for input(s): AMMONIA in the last 168 hours. Coagulation Profile: Recent Labs  Lab 11/16/18 0528  INR 1.03   Cardiac Enzymes: Recent Labs  Lab 11/14/18 0242  TROPONINI <0.03   BNP (last 3 results) No results for input(s): PROBNP in the last 8760 hours. CBG: Recent Labs  Lab 11/16/18 1658 11/16/18 2120 11/17/18 0717 11/17/18 1053 11/17/18 1632  GLUCAP 205* 123* 158* 140* 270*   Studies: No results found.   Time spent: 35 minutes  Author: Berle Mull, MD [facility] Pager: [phone] 11/17/2018 5:26 PM  Between 7PM-7AM, please contact night-coverage at [email or web address], password [facility]

## 2018-11-17 NOTE — CV Procedure (Signed)
   Via right antecubital vein and right radial artery, right and left heart catheterization with coronary angiography were performed without difficulty.  Real-time vascular ultrasound was used for access.  The first diagonal contains ostial to proximal 60 to 70% narrowing.  Luminal irregularities are noted in the LAD.  Arteries otherwise normal.  Severe pulmonary hypertension, 59/22 mmHg with mean pulmonary wedge pressure 22 mmHg and V wave greater than 35 mmHg.  V wave is consistent with mitral regurgitation.  Severe global hypokinesia with EF less than 20%.

## 2018-11-17 NOTE — Interval H&P Note (Signed)
Cath Lab Visit (complete for each Cath Lab visit)  Clinical Evaluation Leading to the Procedure:   ACS: No.  Non-ACS:    Anginal Classification: CCS III  Anti-ischemic medical therapy: Maximal Therapy (2 or more classes of medications)  Non-Invasive Test Results: High-risk stress test findings: cardiac mortality >3%/year  Prior CABG: No previous CABG      History and Physical Interval Note:  11/17/2018 9:07 AM  Toni [name]  has presented today for surgery, with the diagnosis of HF  The various methods of treatment have been discussed with the patient and family. After consideration of risks, benefits and other options for treatment, the patient has consented to  Procedure(s): RIGHT/LEFT HEART CATH AND CORONARY ANGIOGRAPHY (N/A) as a surgical intervention .  The patient's history has been reviewed, patient examined, no change in status, stable for surgery.  I have reviewed the patient's chart and labs.  Questions were answered to the patient's satisfaction.     Belva Crome III

## 2018-11-18 ENCOUNTER — Inpatient Hospital Stay (HOSPITAL_COMMUNITY): Payer: 59

## 2018-11-18 DIAGNOSIS — I5043 Acute on chronic combined systolic (congestive) and diastolic (congestive) heart failure: Secondary | ICD-10-CM

## 2018-11-18 DIAGNOSIS — I428 Other cardiomyopathies: Secondary | ICD-10-CM

## 2018-11-18 LAB — GLUCOSE, CAPILLARY
GLUCOSE-CAPILLARY: 154 mg/dL — AB (ref 70–99)
Glucose-Capillary: 178 mg/dL — ABNORMAL HIGH (ref 70–99)
Glucose-Capillary: 236 mg/dL — ABNORMAL HIGH (ref 70–99)
Glucose-Capillary: 188 mg/dL — ABNORMAL HIGH (ref 70–99)

## 2018-11-18 LAB — COMPREHENSIVE METABOLIC PANEL
ALBUMIN: 3.3 g/dL — AB (ref 3.5–5.0)
ALT: 19 U/L (ref 0–44)
AST: 18 U/L (ref 15–41)
Alkaline Phosphatase: 43 U/L (ref 38–126)
Anion gap: 8 (ref 5–15)
BILIRUBIN TOTAL: 0.8 mg/dL (ref 0.3–1.2)
BUN: 14 mg/dL (ref 8–23)
CO2: 23 mmol/L (ref 22–32)
CREATININE: 0.67 mg/dL (ref 0.44–1.00)
Calcium: 9.3 mg/dL (ref 8.9–10.3)
Chloride: 108 mmol/L (ref 98–111)
GFR calc Af Amer: >60 mL/min (ref >60)
GFR calc non Af Amer: >60 mL/min (ref >60)
Glucose, Bld: 173 mg/dL — ABNORMAL HIGH (ref 70–99)
Potassium: 4.1 mmol/L (ref 3.5–5.1)
Sodium: 139 mmol/L (ref 135–145)
Total Protein: 7.2 g/dL (ref 6.5–8.1)

## 2018-11-18 LAB — CBC WITH DIFFERENTIAL/PLATELET
ABS IMMATURE GRANULOCYTES: 0.02 10*3/uL (ref 0.00–0.07)
Basophils Absolute: 0 10*3/uL (ref 0.0–0.1)
Basophils Relative: 0 %
Eosinophils Absolute: 0.1 10*3/uL (ref 0.0–0.5)
Eosinophils Relative: 2 %
HCT: 42.9 % (ref 36.0–46.0)
Hemoglobin: 13.5 g/dL (ref 12.0–15.0)
Immature Granulocytes: 0 %
Lymphocytes Relative: 42 %
Lymphs Abs: 2 10*3/uL (ref 0.7–4.0)
MCH: 28.9 pg (ref 26.0–34.0)
MCHC: 31.5 g/dL (ref 30.0–36.0)
MCV: 91.9 fL (ref 80.0–100.0)
Monocytes Absolute: 0.5 10*3/uL (ref 0.1–1.0)
Monocytes Relative: 10 %
NEUTROS ABS: 2.2 10*3/uL (ref 1.7–7.7)
Neutrophils Relative %: 46 %
Platelets: 240 10*3/uL (ref 150–400)
RBC: 4.67 MIL/uL (ref 3.87–5.11)
RDW: 13.5 % (ref 11.5–15.5)
WBC: 4.8 10*3/uL (ref 4.0–10.5)
nRBC: 0 % (ref 0.0–0.2)

## 2018-11-18 LAB — MAGNESIUM: Magnesium: 1.7 mg/dL (ref 1.7–2.4)

## 2018-11-18 MED ORDER — SPIRONOLACTONE 12.5 MG HALF TABLET
12.5000 mg | ORAL_TABLET | Freq: Every day | ORAL | Status: DC
  Administered 2018-11-18 – 2018-11-19 (×2): 12.5 mg via ORAL
  Filled 2018-11-18 (×2): qty 1

## 2018-11-18 MED ORDER — MAGNESIUM SULFATE 2 GM/50ML IV SOLN
2.0000 g | Freq: Once | INTRAVENOUS | Status: AC
  Administered 2018-11-18: 2 g via INTRAVENOUS
  Filled 2018-11-18: qty 50

## 2018-11-18 MED ORDER — GADOBUTROL 1 MMOL/ML IV SOLN
7.5000 mL | Freq: Once | INTRAVENOUS | Status: AC | PRN
  Administered 2018-11-18: 7.5 mL via INTRAVENOUS

## 2018-11-18 MED ORDER — SACUBITRIL-VALSARTAN 24-26 MG PO TABS: 1.0000 | ORAL_TABLET | Freq: Two times a day (BID) | ORAL | Status: DC

## 2018-11-18 NOTE — Progress Notes (Signed)
Entresto coupon card given to patient with instructions on usage. B Brittyn Salaz RN,MHA,BSN [phone] 

## 2018-11-18 NOTE — Plan of Care (Signed)
  Problem: Clinical Measurements: Goal: Cardiovascular complication will be avoided Outcome: Progressing   Problem: Nutrition: Goal: Adequate nutrition will be maintained Outcome: Progressing   Problem: Coping: Goal: Level of anxiety will decrease Outcome: Progressing

## 2018-11-18 NOTE — Consult Note (Addendum)
Advanced Heart Failure Team Consult Note   Primary Physician: Donald Prose, MD PCP-Cardiologist:  Sinclair Grooms, MD  Reason for Consultation: Heart Failure  HPI:    Toni [name] is seen today for evaluation of heart failure at the request of Dr Radford Pax.   Toni [name] is a 62 year old [employer] employee with a history of chronic systolic heart failure, NICM, HTN, DM, and hyperlipidemia.   Initially diagnosed with NICM in 1998 thought to be from HTN versus viral. Had cath in 1998 that was negative for coronary disease. EF at that time was 20% but EF recovered in 2012. No ECHO or cath reports are in Epic for this time frame. She was last seen by Dr Tamala Julian in 2017 and was stable at that time.   In October she had a cough/virus and she took OTC meds. Says she would feel better for a little while but then felt bad again. She has been working full time as Development worker, community at [employer] and over the last week she has noticed increased fatigue and dyspnea. Weight at home had gone up 10 pounds and she had productive cough. . On December 26th she was going to work and had a hard time walking in the hospital because she was short of breath. She went to [facility]. She had CXR and EKG done and she was told to go [facility] for further evaluation.   She presented to [facility] ED on December 26th with increased shortness of breath. CXR was completed at Urgent Care and showed bilateral pleural effusions. In the ED she was given IV lasix. BNP was 500. Admitted by [facility]. ECHO was completed and showed EF had gone back down to 15%. Cardiology consulted. Yesterday she had RHC/LHC with nonobstructive CAD. Cardiac output was ok.   Today she denies CP. Mild dyspnea with exertion.   RHC/LHC RA 6 PA 58/22 (37)  PCWP 22 CO 5.3 CI 2.9   Echo 11/16/2018  Severely reduced LV function with LVEF 15%. Moderate, central   mitral regurgitation likely secondary MR due to LV dysfunction. Review of Systems: [y] =  yes, [ ]  = no   General: Weight gain [ ] ; Weight loss [ ] ; Anorexia [ ] ; Fatigue [ ] ; Fever [ ] ; Chills [ ] ; Weakness [ ]   Cardiac: Chest pain/pressure [ ] ; Resting SOB [ ] ; Exertional SOB [Y ]; Orthopnea [ ] ; Pedal Edema [ ] ; Palpitations [ ] ; Syncope [ ] ; Presyncope [ ] ; Paroxysmal nocturnal dyspnea[ ]   Pulmonary: Cough [ ] ; Wheezing[ ] ; Hemoptysis[ ] ; Sputum [ ] ; Snoring [ ]   GI: Vomiting[ ] ; Dysphagia[ ] ; Melena[ ] ; Hematochezia [ ] ; Heartburn[ ] ; Abdominal pain [ ] ; Constipation [ ] ; Diarrhea [ ] ; BRBPR [ ]   GU: Hematuria[ ] ; Dysuria [ ] ; Nocturia[ ]   Vascular: Pain in legs with walking [ ] ; Pain in feet with lying flat [ ] ; Non-healing sores [ ] ; Stroke [ ] ; TIA [ ] ; Slurred speech [ ] ;  Neuro: Headaches[ ] ; Vertigo[ ] ; Seizures[ ] ; Paresthesias[ ] ;Blurred vision [ ] ; Diplopia [ ] ; Vision changes [ ]   Ortho/Skin: Arthritis [ ] ; Joint pain [ ] ; Muscle pain [ ] ; Joint swelling [ ] ; Back Pain [ ] ; Rash [ ]   Psych: Depression[ ] ; Anxiety[ ]   Heme: Bleeding problems [ ] ; Clotting disorders [ ] ; Anemia [ ]   Endocrine: Diabetes [ Y]; Thyroid dysfunction[ ]   Home Medications Prior to Admission medications   Medication Sig Start Date End  Date Taking? Authorizing Provider  carvedilol (COREG) 25 MG tablet Take 50 mg by mouth 2 (two) times daily with a meal.    Yes [provider]  dapagliflozin propanediol (FARXIGA) 5 MG TABS tablet Take 5 mg by mouth daily.   Yes [provider]  felodipine (PLENDIL) 2.5 MG 24 hr tablet Take 2.5 mg by mouth daily.   Yes [provider]  furosemide (LASIX) 20 MG tablet Take 20 mg by mouth daily.    Yes [provider]  glipiZIDE (GLUCOTROL XL) 10 MG 24 hr tablet Take 10 mg by mouth 2 (two) times daily with a meal.    Yes [provider]  Liraglutide (VICTOZA [place]) Inject 1.8 mg into the skin every evening.    Yes [provider]  metFORMIN (GLUCOPHAGE) 1000 MG tablet Take 1,000 mg by mouth 2 (two) times daily with  a meal.   Yes [provider]  Multiple Vitamins-Minerals (CENTRUM SILVER PO) Take 1 tablet by mouth daily. Reported on 02/10/2016   Yes [provider]  potassium chloride SA (K-DUR,KLOR-CON) 20 MEQ tablet Take 20 mEq by mouth daily. 12/05/15  Yes [provider]  simvastatin (ZOCOR) 20 MG tablet Take 20 mg by mouth every evening.   Yes [provider]  valsartan (DIOVAN) 320 MG tablet Take 320 mg by mouth daily.   Yes [provider]  TRUE METRIX BLOOD GLUCOSE TEST test strip Use as directed. 12/05/15   [provider]  TRUEPLUS LANCETS 30G MISC Use as directed. 12/05/15   [provider]    Past Medical History: Past Medical History:  Diagnosis Date  . CHF (congestive heart failure) ([place])   . Diabetes mellitus without complication ([place])   . Hypertension     Past Surgical History: Past Surgical History:  Procedure Laterality Date  . ABDOMINAL HYSTERECTOMY    . CARDIAC CATHETERIZATION     in [facility], clean, per pt.  Marland Kitchen RIGHT/LEFT HEART CATH AND CORONARY ANGIOGRAPHY N/A 11/17/2018   Procedure: RIGHT/LEFT HEART CATH AND CORONARY ANGIOGRAPHY;  Surgeon: Belva Crome, MD;  Location: [facility];  Service: Cardiovascular;  Laterality: N/A;    Family History: Family History  Problem Relation Age of Onset  . Diabetes Mellitus I Mother   . Lung cancer Mother   . Diabetes Mellitus I Father   . Sudden death Father 62  . Hypertension Brother   . Hypertension Brother   . Diabetes Mellitus I Brother   . Benign prostatic hyperplasia Brother   . Heart failure Paternal Uncle     Social History: Social History   Socioeconomic History  . Marital status: Single    Spouse name: Not on file  . Number of children: Not on file  . Years of education: Not on file  . Highest education level: Not on file  Occupational History  . Occupation: Surveyor, quantity: [employer]  . Financial resource  strain: Not on file  . Food insecurity:    Worry: Not on file    Inability: Not on file  . Transportation needs:    Medical: Not on file    Non-medical: Not on file  Tobacco Use  . Smoking status: Never Smoker  . Smokeless tobacco: Never Used  Substance and Sexual Activity  . Alcohol use: Yes    Alcohol/week: 0.0 standard drinks    Comment: less than once a month  . Drug use: No  . Sexual  activity: Never  Lifestyle  . Physical activity:    Days per week: Not on file    Minutes per session: Not on file  . Stress: Not on file  Relationships  . Social connections:    Talks on phone: Not on file    Gets together: Not on file    Attends religious service: Not on file    Active member of club or organization: Not on file    Attends meetings of clubs or organizations: Not on file    Relationship status: Not on file  Other Topics Concern  . Not on file  Social History Narrative   Works at [employer].  Lives alone.      Allergies:  Allergies  Allergen Reactions  . Biaxin [Clarithromycin]   . Enalapril Maleate Other (See Comments)    lip  . Vasotec [Enalapril]     Objective:    Vital Signs:   Temp:  [97.8 F (36.6 C)-98 F (36.7 C)] 98 F (36.7 C) (12/31 0450) Pulse Rate:  [78-88] 78 (12/31 0450) Resp:  [15-34] 18 (12/31 0450) BP: (99-134)/(65-94) 99/65 (12/31 0450) SpO2:  [96 %-100 %] 99 % (12/31 0450) Weight:  [74.5 kg] 74.5 kg (12/31 0450)    Weight change: Filed Weights   11/16/18 0549 11/17/18 0404 11/18/18 0450  Weight: 74.2 kg 74.7 kg 74.5 kg    Intake/Output:   Intake/Output Summary (Last 24 hours) at 11/18/2018 0934 Last data filed at 11/18/2018 0600 Gross per 24 hour  Intake 120 ml  Output 400 ml  Net -280 ml      Physical Exam    General:  Well appearing. No resp difficulty. Sitting on the side of the bed.  HEENT: normal Neck: supple. JVP 5-6 . Carotids 2+ bilat; no bruits. No lymphadenopathy or thyromegaly appreciated. Cor: PMI nondisplaced.  Regular rate & rhythm. No rubs, gallops or murmurs. Lungs: clear Abdomen: soft, nontender, nondistended. No hepatosplenomegaly. No bruits or masses. Good bowel sounds. Extremities: no cyanosis, clubbing, rash, edema Neuro: alert & orientedx3, cranial nerves grossly intact. moves all 4 extremities w/o difficulty. Affect pleasant   Telemetry   SR 70-80s   EKG    11/15/2017 SR 74 bpm QRS 96 Toni   Labs   Basic Metabolic Panel: Recent Labs  Lab 11/13/18 2138 11/15/18 0026 11/16/18 0528 11/17/18 0441 11/18/18 0515  NA 141 140 141 139 139  K 3.6 3.2* 4.0 4.3 4.1  CL 106 102 102 106 108  CO2 29 28 28 23 23   GLUCOSE 171* 141* 170* 160* 173*  BUN 14 14 14 17 14   CREATININE 0.76 0.72 0.96 0.70 0.67  CALCIUM 9.3 9.1 9.3 9.3 9.3  MG  --  1.9  --   --  1.7    Liver Function Tests: Recent Labs  Lab 11/18/18 0515  AST 18  ALT 19  ALKPHOS 43  BILITOT 0.8  PROT 7.2  ALBUMIN 3.3*   No results for input(s): LIPASE, AMYLASE in the last 168 hours. No results for input(s): AMMONIA in the last 168 hours.  CBC: Recent Labs  Lab 11/13/18 2138 11/15/18 0026 11/17/18 1101 11/18/18 0515  WBC 5.9 5.4 5.3 4.8  NEUTROABS  --  2.4  --  2.2  HGB 13.6 12.5 13.6 13.5  HCT 43.4 40.0 42.1 42.9  MCV 91.9 91.5 91.7 91.9  PLT 315 266 228 240    Cardiac Enzymes: Recent Labs  Lab 11/14/18 0242  TROPONINI <0.03    BNP: BNP (last 3  results) Recent Labs    11/14/18 0242  BNP 520.0*    ProBNP (last 3 results) No results for input(s): PROBNP in the last 8760 hours.   CBG: Recent Labs  Lab 11/17/18 0717 11/17/18 1053 11/17/18 1632 11/17/18 2118 11/18/18 0908  GLUCAP 158* 140* 270* 188* 178*    Coagulation Studies: Recent Labs    11/16/18 0528  LABPROT 13.4  INR 1.03     Imaging    No results found.   Medications:     Current Medications: . aspirin  81 mg Oral Daily  . carvedilol  25 mg Oral BID WC  . enoxaparin (LOVENOX) injection  40 mg Subcutaneous  Q24H  . furosemide  40 mg Oral Daily  . insulin aspart  0-15 Units Subcutaneous TID WC  . insulin aspart  0-5 Units Subcutaneous QHS  . potassium chloride  40 mEq Oral Daily  . simvastatin  20 mg Oral QPM  . sodium chloride flush  3 mL Intravenous Q12H  . sodium chloride flush  3 mL Intravenous Q12H     Infusions: . sodium chloride         Patient Profile    Toni [name] is a 61 year old [employer] employee with a history of chronic systolic heart failure, NICM, HTN, DM, and hyperlipidemia.   Admitted with increased dyspnea.    Assessment/Plan   1. A/C Systolic Heart Failure Initially diagnosed with NICM in 1998. At one point EF was down to 20% and later recovered in 2011. ECHO this admit showed reduced down to 15-20%. TSH is normal RHC/LHC on 12/30 with nonobstructive CAD but doesn't explain reduced EF. Obtain CMRI to further assess. ? Viral cardiomyopathy.  - Volume status stable. Continue lasix 40 mg daily.  - Continue carvedilol 25 mg twice a day. PTA she was on carvedilol 50 mg twice a day.  - Considered entresto but she had angioedema with enalapril. PTA she was on diovan 320 mg daily. With soft bp will hold off and add tomorrow.  - Start 12.5 spiro daily. Stop potassium. - BMET in am.    2. CAD LHC 12/30 with 1st diagonal ostial to proximal 60-70% narrowing.  -No chest pain.  On asa and statin.   3. HTN  Better controlled.   4. DM Hgb A1C 6.9  On insulin.   5. Hypomagnesia Mag 1.7. Give 2 grams mag.   We will follow with you and set up HF follow up in the clinic.   Medication concerns reviewed with patient and pharmacy team. Barriers identified: none   Length of Stay: 2  Darrick Grinder, NP  11/18/2018, 9:34 AM  Advanced Heart Failure Team Pager [phone] (M-F; 7a - 4p)  Please contact [facility] for night-coverage after hours (4p -7a ) and weekends on [email or web address]  Patient seen with NP, agree with the above note.    I reviewed her cardiac MRI done  today, showing EF 14% with moderate LV dilation, RV EF 17% with mild dilation, and nonspecific RV insertion site LGE.    She is feeling good and looks euvolemic on exam.  BP is somewhat soft, SBP 90s-100s.    On exam, no JVD.  Regular S1S2.  Clear lungs.  No edema.   Nonischemic cardiomyopathy, suspect familial (brother with cardiomyopathy) versus viral myocarditis.  No heavy ETOH or drugs.  Nonobstructive CAD.  Cardiac MRI with biventricular failure, not suggestive of infiltrative disease. Though she feels good currently and cardiac output was preserved on RHC, very  low LV and RV EF is concerning.   - Continue Coreg 25 mg bid.  - Add spironolactone 12.5 daily.  - No ACEI or ARNI with angioedema on ACEI.  Will try to restart valsartan tomorrow.  - Lasix 40 mg daily.  - Not CRT candidate with narrow QRS.  If EF does not improve with treatment, will need ICD.   She will need close followup in CHF clinic.  Suspect she can go home tomorrow.   Loralie Champagne 11/18/2018 4:59 PM

## 2018-11-18 NOTE — Progress Notes (Signed)
1435 Have checked twice to see pt but still in MRI. Will continue to follow. Graylon Good RN BSN 11/18/2018 2:44 PM

## 2018-11-18 NOTE — Care Management (Signed)
#  6.   S/W SECARA  @ [employer] # [phone]  ENTRESTO   49/51 MG BID COVER- YES CO-PAY- $ 123.09 TIER- 3 DRUG PRIOR APPROVAL- NO  NO DEDUCTIBLE OUT-OF-POCKET : NO MET  PREFERRED PHARMACY : YES [facility]

## 2018-11-18 NOTE — Progress Notes (Signed)
PROGRESS NOTE                                                                                                                                                                                                             Patient Demographics:    Toni [name], is a 62 y.o. female, DOB - 15-Feb-1956, HUT:[id number]  Admit date - 11/13/2018   Admitting Physician Etta Quill, DO  Outpatient Primary MD for the patient is Donald Prose, MD  LOS - 2  Outpatient Specialists: Cardiology  Chief Complaint  Patient presents with  . Shortness of Breath       Brief Narrative 62 year old female with history of diabetes mellitus, coronary artery disease, systolic CHF (nonischemic cardiomyopathy) and hypertension presenting with shortness of breath and found to have acute on chronic combined systolic and diastolic CHF.  She underwent right and left heart catheterization showing showing 70-80% OD, 40% LAD lesion with moderate to severe pulmonary hypertension.  EF of 15% on echo with grade 2 diastolic dysfunction.    Subjective:   Patient reports her breathing to be slightly better from previous days.  No other overnight events.   Assessment  & Plan :    Principal Problem:   Acute on chronic combined systolic and diastolic CHF (congestive heart failure) ([name]) EF of 15% with grade 2 diastolic dysfunction on echo. Left and right heart catheterization finding above.  Nonobstructive disease.  Advance heart failure team consulted. Continue Lasix 40 mg daily.  Heart failure team added Entresto today.  Care manager consulted for medication assistance.  Continue Coreg 25 mg twice daily.  Continue aspirin and statin.  Cardiac rehab. Planning on cardiac MRI to further assess her cardiomyopathy. Monitor strict I's/O and daily weight.     Active Problems: Coronary artery disease Continue aspirin, Coreg and statin.  LHC finding as  above.   Hypokalemia/hypomagnesemia Replenished    Diabetes mellitus without complication ([name])  Stable.  Continue insulin sliding scale coverage.      Code Status : Full code  Family Communication  : None at bedside  Disposition Plan  : Home pending further recommendation from heart failure team  Barriers For Discharge : Active symptoms  Consults  : Cardiology  Procedures  : LHC/RHC, 2D echo  DVT Prophylaxis  : Lovenox  Lab Results  Component Value Date  PLT 240 11/18/2018    Antibiotics  :   Anti-infectives (From admission, onward)   None        Objective:   Vitals:   11/17/18 1055 11/17/18 1928 11/18/18 0450 11/18/18 0950  BP: 109/70 104/72 99/65 117/80  Pulse: 81 79 78 78  Resp: 18 18 18    Temp: 97.8 F (36.6 C) 97.8 F (36.6 C) 98 F (36.7 C)   TempSrc: Oral Oral Oral   SpO2: 96% 99% 99%   Weight:   74.5 kg   Height:        Wt Readings from Last 3 Encounters:  11/18/18 74.5 kg     Intake/Output Summary (Last 24 hours) at 11/18/2018 1130 Last data filed at 11/18/2018 0900 Gross per 24 hour  Intake 360 ml  Output 400 ml  Net -40 ml     Physical Exam  Gen: not in distress HEENT: no pallor, JVD +, moist mucosa, supple neck Chest: clear b/l, no added sounds CVS: N S1&S2, no murmurs, rubs or gallop GI: soft, NT, ND, BS+ Musculoskeletal: warm, no edema     Data Review:    CBC Recent Labs  Lab 11/13/18 2138 11/15/18 0026 11/17/18 1101 11/18/18 0515  WBC 5.9 5.4 5.3 4.8  HGB 13.6 12.5 13.6 13.5  HCT 43.4 40.0 42.1 42.9  PLT 315 266 228 240  MCV 91.9 91.5 91.7 91.9  MCH 28.8 28.6 29.6 28.9  MCHC 31.3 31.3 32.3 31.5  RDW 13.6 13.7 13.6 13.5  LYMPHSABS  --  2.4  --  2.0  MONOABS  --  0.4  --  0.5  EOSABS  --  0.1  --  0.1  BASOSABS  --  0.0  --  0.0    Chemistries  Recent Labs  Lab 11/13/18 2138 11/15/18 0026 11/16/18 0528 11/17/18 0441 11/18/18 0515  NA 141 140 141 139 139  K 3.6 3.2* 4.0 4.3 4.1  CL 106 102  102 106 108  CO2 29 28 28 23 23   GLUCOSE 171* 141* 170* 160* 173*  BUN 14 14 14 17 14   CREATININE 0.76 0.72 0.96 0.70 0.67  CALCIUM 9.3 9.1 9.3 9.3 9.3  MG  --  1.9  --   --  1.7  AST  --   --   --   --  18  ALT  --   --   --   --  19  ALKPHOS  --   --   --   --  43  BILITOT  --   --   --   --  0.8   ------------------------------------------------------------------------------------------------------------------ No results for input(s): CHOL, HDL, LDLCALC, TRIG, CHOLHDL, LDLDIRECT in the last 72 hours.  Lab Results  Component Value Date   HGBA1C 6.9 (H) 11/14/2018   ------------------------------------------------------------------------------------------------------------------ Recent Labs    11/15/18 1311  TSH 0.637   ------------------------------------------------------------------------------------------------------------------ No results for input(s): VITAMINB12, FOLATE, FERRITIN, TIBC, IRON, RETICCTPCT in the last 72 hours.  Coagulation profile Recent Labs  Lab 11/16/18 0528  INR 1.03    No results for input(s): DDIMER in the last 72 hours.  Cardiac Enzymes Recent Labs  Lab 11/14/18 0242  TROPONINI <0.03   ------------------------------------------------------------------------------------------------------------------    Component Value Date/Time   BNP 520.0 (H) 11/14/2018 0242    Inpatient Medications  Scheduled Meds: . aspirin  81 mg Oral Daily  . carvedilol  25 mg Oral BID WC  . enoxaparin (LOVENOX) injection  40 mg Subcutaneous Q24H  . furosemide  40  mg Oral Daily  . insulin aspart  0-15 Units Subcutaneous TID WC  . insulin aspart  0-5 Units Subcutaneous QHS  . potassium chloride  40 mEq Oral Daily  . sacubitril-valsartan  1 tablet Oral BID  . simvastatin  20 mg Oral QPM  . sodium chloride flush  3 mL Intravenous Q12H  . sodium chloride flush  3 mL Intravenous Q12H   Continuous Infusions: . sodium chloride     PRN Meds:.sodium chloride,  acetaminophen, ondansetron (ZOFRAN) IV, sodium chloride flush, sodium chloride flush  Micro Results No results found for this or any previous visit (from the past 240 hour(s)).  Radiology Reports No results found.  Time Spent in minutes  25   Cammy Sanjurjo M.D on 11/18/2018 at 11:30 AM  Between 7am to 7pm - Pager - [phone]  After 7pm go to [email or web address] - password [facility]  [facility] -  Office  [phone]

## 2018-11-19 DIAGNOSIS — E118 Type 2 diabetes mellitus with unspecified complications: Secondary | ICD-10-CM

## 2018-11-19 DIAGNOSIS — E876 Hypokalemia: Secondary | ICD-10-CM

## 2018-11-19 LAB — GLUCOSE, CAPILLARY: Glucose-Capillary: 141 mg/dL — ABNORMAL HIGH (ref 70–99)

## 2018-11-19 LAB — BASIC METABOLIC PANEL
Anion gap: 5 (ref 5–15)
BUN: 16 mg/dL (ref 8–23)
CALCIUM: 9.1 mg/dL (ref 8.9–10.3)
CO2: 28 mmol/L (ref 22–32)
Chloride: 104 mmol/L (ref 98–111)
Creatinine, Ser: 0.67 mg/dL (ref 0.44–1.00)
GFR calc Af Amer: >60 mL/min (ref >60)
GFR calc non Af Amer: >60 mL/min (ref >60)
Glucose, Bld: 153 mg/dL — ABNORMAL HIGH (ref 70–99)
Potassium: 4 mmol/L (ref 3.5–5.1)
SODIUM: 137 mmol/L (ref 135–145)

## 2018-11-19 LAB — MAGNESIUM: Magnesium: 1.9 mg/dL (ref 1.7–2.4)

## 2018-11-19 MED ORDER — FUROSEMIDE 20 MG PO TABS: 40.0000 mg | ORAL_TABLET | Freq: Every day | ORAL | 0 refills | Status: DC

## 2018-11-19 MED ORDER — ASPIRIN 81 MG PO CHEW: 81.0000 mg | CHEWABLE_TABLET | Freq: Every day | ORAL | 0 refills | Status: AC

## 2018-11-19 MED ORDER — SPIRONOLACTONE 25 MG PO TABS: 12.5000 mg | ORAL_TABLET | Freq: Every day | ORAL | 0 refills | Status: DC

## 2018-11-19 MED ORDER — VALSARTAN 40 MG PO TABS: 40.0000 mg | ORAL_TABLET | Freq: Two times a day (BID) | ORAL | 0 refills | Status: DC

## 2018-11-19 MED ORDER — LOSARTAN POTASSIUM 25 MG PO TABS: 25.0000 mg | ORAL_TABLET | Freq: Every day | ORAL | Status: DC

## 2018-11-19 MED ORDER — CARVEDILOL 25 MG PO TABS: 25.0000 mg | ORAL_TABLET | Freq: Two times a day (BID) | ORAL | 0 refills | Status: DC

## 2018-11-19 NOTE — Discharge Summary (Signed)
Physician Discharge Summary  Toni [name] EVO:[id number] DOB: 31-Aug-1956 DOA: 11/13/2018  PCP: Toni Prose, MD  Admit date: 11/13/2018 Discharge date: 11/19/2018  Admitted From: Home Disposition: Home  Recommendations for Outpatient Follow-up:  Follow up in the advanced heart failure clinic on 1/10 at 10:30 AM  Home Health: None Equipment/Devices: None  Discharge Condition: Fair CODE STATUS: Full code Diet recommendation: Heart Healthy / Carb Modified      Discharge Diagnoses:  Principal Problem:   Acute on chronic systolic CHF (congestive heart failure) ([place])  Active Problems: Essential hypertension   DM (diabetes mellitus), type 2 with complications (Toni [name])   Hyperlipidemia   Diabetes mellitus without complication (Toni [name])   Hypokalemia   Hypomagnesemia  Brief narrative/HPI 63 year old female with history of diabetes mellitus, coronary artery disease, systolic CHF (nonischemic cardiomyopathy) and hypertension presenting with shortness of breath and found to have acute on chronic combined systolic and diastolic CHF.  She underwent right and left heart catheterization showing showing 70-80% OD, 40% LAD lesion with moderate to severe pulmonary hypertension.  EF of 15% on echo with grade 2 diastolic dysfunction.  Hospital course  Principal Problem:   Acute on chronic combined systolic and diastolic CHF (congestive heart failure) ([name]) EF of 15% with grade 2 diastolic dysfunction on echo. Left and right heart catheterization shows nonobstructive disease but unclear why she has reduced EF.  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  Cardiac MRI shows biventricular failure with no infiltrative disease.  Now on daily Lasix, carvedilol dose lowered to 25 mg twice daily.  Not on ACE inhibitor due to angioedema.  Tolerating valsartan as outpatient. She is planned to be started on Entresto but held due to concern for angioedema. She is being restarted on valsartan at 40 mg twice  daily and added low-dose Aldactone (12.5 mg daily). Patient appears euvolemic clinically.  Cardiology recommends is not CRT candidate due to narrow QRS.  If EF not improved with treatment plan for ICD.  Stable to be discharged home with outpatient follow-up.     Active Problems: Coronary artery disease Added baby aspirin., Coreg and statin.  LHC finding as above.  Essential hypertension Soft blood pressure.  Coreg dose reduced, added low-dose valsartan and Aldactone.  Will discontinue felodipine.  Hypokalemia/hypomagnesemia Replenished.  Does not need further potassium supplement    Diabetes mellitus without complication ([name])  Stable.  Continue insulin, glipizide, metformin       Family Communication  : None at bedside  Disposition Plan  : Home  Consults  : Cardiology  Procedures  : LHC/RHC, 2D echo   Discharge Instructions   Allergies as of 11/19/2018      Reactions   Biaxin [clarithromycin]    Enalapril Maleate Other (See Comments)   Lip swelling / angioedema   Vasotec [enalapril] Other (See Comments)   Lip swelling / angioedema Tolerates ARB      Medication List    STOP taking these medications   felodipine 2.5 MG 24 hr tablet Commonly known as:  PLENDIL   potassium chloride SA 20 MEQ tablet Commonly known as:  K-DUR,KLOR-CON     TAKE these medications   aspirin 81 MG chewable tablet Chew 1 tablet (81 mg total) by mouth daily. Start taking on:  November 20, 2018   carvedilol 25 MG tablet Commonly known as:  COREG Take 1 tablet (25 mg total) by mouth 2 (two) times daily with a meal. What changed:  how much to take   CENTRUM SILVER PO Take 1 tablet  by mouth daily. Reported on 02/10/2016   FARXIGA 5 MG Tabs tablet Generic drug:  dapagliflozin propanediol Take 5 mg by mouth daily.   furosemide 20 MG tablet Commonly known as:  LASIX Take 2 tablets (40 mg total) by mouth daily. What changed:  how much to take   glipiZIDE 10 MG 24  hr tablet Commonly known as:  GLUCOTROL XL Take 10 mg by mouth 2 (two) times daily with a meal.   metFORMIN 1000 MG tablet Commonly known as:  GLUCOPHAGE Take 1,000 mg by mouth 2 (two) times daily with a meal.   simvastatin 20 MG tablet Commonly known as:  ZOCOR Take 20 mg by mouth every evening.   spironolactone 25 MG tablet Commonly known as:  ALDACTONE Take 0.5 tablets (12.5 mg total) by mouth daily. Start taking on:  November 20, 2018   TRUE METRIX BLOOD GLUCOSE TEST test strip Generic drug:  glucose blood Use as directed.   TRUEPLUS LANCETS 30G Misc Use as directed.   valsartan 40 MG tablet Commonly known as:  DIOVAN Take 1 tablet (40 mg total) by mouth 2 (two) times daily. What changed:    medication strength  how much to take  when to take this   VICTOZA [place] Inject 1.8 mg into the skin every evening.      Follow-up Information    [facility]. Go on 11/28/2018.   Specialty:  Cardiology Why:  at 10:30 AM with Dr. Aundra Dubin.  Please bring all medications to appt.  Gate code is [phone] for JanCopy information: [address] [id number] [place] [phone]         Allergies  Allergen Reactions  . Biaxin [Clarithromycin]   . Enalapril Maleate Other (See Comments)    Lip swelling / angioedema  . Vasotec [Enalapril] Other (See Comments)    Lip swelling / angioedema Tolerates ARB        Procedures/Studies: Mr Cardiac Morphology W Wo Contrast  Result Date: 11/18/2018 CLINICAL DATA:  Nonischemic cardiomyopathy EXAM: CARDIAC MRI TECHNIQUE: The patient was scanned on a 1.5 Tesla GE magnet. A dedicated cardiac coil was used. Functional imaging was done using Fiesta sequences. 2,3, and 4 chamber views were done to assess for RWMA's. Modified Simpson's rule using a short axis stack was used to calculate an ejection fraction on a dedicated work Conservation officer, nature. The  patient received 8 cc of Gadavist. After 10 minutes inversion recovery sequences were used to assess for infiltration and scar tissue. FINDINGS: Small right pleural effusion. Moderately dilated left ventricle with normal wall thickness. EF 14%, diffuse hypokinesis. I do not see an LV thrombus. Moderate left atrial dilation. Mildly dilated right ventricle with severely decreased systolic function, EF 70%. Mild right atrial dilation. Mild tricuspid regurgitation. There is at least moderate mitral regurgitation, flow sequences to quantify were not done. MR appeared central, likely functional. Trileaflet aortic valve with no stenosis or regurgitation. On delayed enhancement imaging, there was an area of mid-wall late gadolinium enhancement (LGE) at the inferoseptal RV insertion site. Measurements: LVEDV 294 mL LVSV 40 mL LVEF 14% RVEDV 182 mL RVSV 30 mL RVEF 17% IMPRESSION: 1.  Moderately dilated LV with EF 14%, diffuse hypokinesis. 2. Mildly dilated RV with severely decreased systolic function, EF 62%. 3. At least moderate, central mitral regurgitation (likely functional). 4. On delayed enhancement imaging, there was mid-wall LGE at the inferoseptal RV insertion site. This is nonspecific and can be  seen with pressure and volume overload. Dalton Mclean Electronically Signed   By: Loralie Champagne M.D.   On: 11/18/2018 15:47     Subjective: Reports breathing to be better.  Blood pressure was soft yesterday, better this a.m.  Discharge Exam: Vitals:   11/18/18 2353 11/19/18 0406  BP: (!) 88/47 101/68  Pulse:  76  Resp:  18  Temp:  98.3 F (36.8 C)  SpO2:  96%   Vitals:   11/18/18 1933 11/18/18 2351 11/18/18 2353 11/19/18 0406  BP: 97/65 (!) 81/50 (!) 88/47 101/68  Pulse: 77 79  76  Resp: 18   18  Temp: 98.4 F (36.9 C) 97.9 F (36.6 C)  98.3 F (36.8 C)  TempSrc: Oral Oral  Oral  SpO2: 98% 97%  96%  Weight:    75.1 kg  Height:        General: Not in distress HEENT: Moist mucosa, supple  neck Chest: Clear bilaterally CVS: Normal S1-S2, no murmurs GI: Soft, nondistended, nontender Musculoskeletal: Warm, no edema    The results of significant diagnostics from this hospitalization (including imaging, microbiology, ancillary and laboratory) are listed below for reference.     Microbiology: No results found for this or any previous visit (from the past 240 hour(s)).   Labs: BNP (last 3 results) Recent Labs    11/14/18 0242  BNP 423.5*   Basic Metabolic Panel: Recent Labs  Lab 11/15/18 0026 11/16/18 0528 11/17/18 0441 11/18/18 0515 11/19/18 0459  NA 140 141 139 139 137  K 3.2* 4.0 4.3 4.1 4.0  CL 102 102 106 108 104  CO2 28 28 23 23 28   GLUCOSE 141* 170* 160* 173* 153*  BUN 14 14 17 14 16   CREATININE 0.72 0.96 0.70 0.67 0.67  CALCIUM 9.1 9.3 9.3 9.3 9.1  MG 1.9  --   --  1.7 1.9   Liver Function Tests: Recent Labs  Lab 11/18/18 0515  AST 18  ALT 19  ALKPHOS 43  BILITOT 0.8  PROT 7.2  ALBUMIN 3.3*   No results for input(s): LIPASE, AMYLASE in the last 168 hours. No results for input(s): AMMONIA in the last 168 hours. CBC: Recent Labs  Lab 11/13/18 2138 11/15/18 0026 11/17/18 1101 11/18/18 0515  WBC 5.9 5.4 5.3 4.8  NEUTROABS  --  2.4  --  2.2  HGB 13.6 12.5 13.6 13.5  HCT 43.4 40.0 42.1 42.9  MCV 91.9 91.5 91.7 91.9  PLT 315 266 228 240   Cardiac Enzymes: Recent Labs  Lab 11/14/18 0242  TROPONINI <0.03   BNP: Invalid input(s): POCBNP CBG: Recent Labs  Lab 11/18/18 0908 11/18/18 1224 11/18/18 1642 11/18/18 2127 11/19/18 0715  GLUCAP 178* 236* 154* 188* 141*   D-Dimer No results for input(s): DDIMER in the last 72 hours. Hgb A1c No results for input(s): HGBA1C in the last 72 hours. Lipid Profile No results for input(s): CHOL, HDL, LDLCALC, TRIG, CHOLHDL, LDLDIRECT in the last 72 hours. Thyroid function studies No results for input(s): TSH, T4TOTAL, T3FREE, THYROIDAB in the last 72 hours.  Invalid input(s):  FREET3 Anemia work up No results for input(s): VITAMINB12, FOLATE, FERRITIN, TIBC, IRON, RETICCTPCT in the last 72 hours. Urinalysis    Component Value Date/Time   COLORURINE YELLOW 05/14/2013 [place] 05/14/2013 1244   LABSPEC 1.023 05/14/2013 1244   PHURINE 7.0 05/14/2013 1244   GLUCOSEU 100 (A) 05/14/2013 1244   HGBUR LARGE (A) 05/14/2013 [place] 05/14/2013 1244  KETONESUR NEGATIVE 05/14/2013 [place] 05/14/2013 1244   UROBILINOGEN 0.2 05/14/2013 1244   NITRITE NEGATIVE 05/14/2013 1244   LEUKOCYTESUR NEGATIVE 05/14/2013 1244   Sepsis Labs Invalid input(s): PROCALCITONIN,  WBC,  LACTICIDVEN Microbiology No results found for this or any previous visit (from the past 240 hour(s)).   Time coordinating discharge: 35 minutes  SIGNED:   Louellen Molder, MD  [facility] 11/19/2018, 11:15 AM Pager   If 7PM-7AM, please contact night-coverage [email or web address] Password [email or web address]

## 2018-11-19 NOTE — Discharge Instructions (Signed)
Heart Failure °Heart failure is a condition in which the heart has trouble pumping blood because it has become weak or stiff. This means that the heart does not pump blood efficiently for the body to work well. For some people with heart failure, fluid may back up into the lungs and there may be swelling (edema) in the lower legs. Heart failure is usually a long-term (chronic) condition. It is important for you to take good care of yourself and follow the treatment plan from your health care provider. °What are the causes? °This condition is caused by some health problems, including: °· High blood pressure (hypertension). Hypertension causes the heart muscle to work harder than normal. High blood pressure eventually causes the heart to become stiff and weak. °· Coronary artery disease (CAD). CAD is the buildup of cholesterol and fat (plaques) in the arteries of the heart. °· Heart attack (myocardial infarction). Injured tissue, which is caused by the heart attack, does not contract as well and the heart's ability to pump blood is weakened. °· Abnormal heart valves. When the heart valves do not open and close properly, the heart muscle must pump harder to keep the blood flowing. °· Heart muscle disease (cardiomyopathy or myocarditis). Heart muscle disease is damage to the heart muscle from a variety of causes, such as drug or alcohol abuse, infections, or unknown causes. These can increase the risk of heart failure. °· Lung disease. When the lungs do not work properly, the heart must work harder. °What increases the risk? °Risk of heart failure increases as a person ages. This condition is also more likely to develop in people who: °· Are overweight. °· Are female. °· Smoke or chew tobacco. °· Abuse alcohol or illegal drugs. °· Have taken medicines that can damage the heart, such as chemotherapy drugs. °· Have diabetes. °? High blood sugar (glucose) is associated with high fat (lipid) levels in the blood. °? Diabetes  can also damage tiny blood vessels that carry nutrients to the heart muscle. °· Have abnormal heart rhythms. °· Have thyroid problems. °· Have low blood counts (anemia). °What are the signs or symptoms? °Symptoms of this condition include: °· Shortness of breath with activity, such as when climbing stairs. °· Persistent cough. °· Swelling of the feet, ankles, legs, or abdomen. °· Unexplained weight gain. °· Difficulty breathing when lying flat (orthopnea). °· Waking from sleep because of the need to sit up and get more air. °· Rapid heartbeat. °· Fatigue and loss of energy. °· Feeling light-headed, dizzy, or close to fainting. °· Loss of appetite. °· Nausea. °· Increased urination during the night (nocturia). °· Confusion. °How is this diagnosed? °This condition is diagnosed based on: °· Medical history, symptoms, and a physical exam. °· Diagnostic tests, which may include: °? Echocardiogram. °? Electrocardiogram (ECG). °? Chest X-ray. °? Blood tests. °? Exercise stress test. °? Radionuclide scans. °? Cardiac catheterization and angiogram. °How is this treated? °Treatment for this condition is aimed at managing the symptoms of heart failure. Medicines, behavioral changes, or other treatments may be necessary to treat heart failure. °Medicines °These may include: °· Angiotensin-converting enzyme (ACE) inhibitors. This type of medicine blocks the effects of a blood protein called angiotensin-converting enzyme. ACE inhibitors relax (dilate) the blood vessels and help to lower blood pressure. °· Angiotensin receptor blockers (ARBs). This type of medicine blocks the actions of a blood protein called angiotensin. ARBs dilate the blood vessels and help to lower blood pressure. °· Water pills (diuretics). Diuretics cause   the kidneys to remove salt and water from the blood. The extra fluid is removed through urination, leaving a lower volume of blood that the heart has to pump. °· Beta blockers. These improve heart muscle  strength and they prevent the heart from beating too quickly. °· Digoxin. This increases the force of the heartbeat. °Healthy behavior changes °These may include: °· Reaching and maintaining a healthy weight. °· Stopping smoking or chewing tobacco. °· Eating heart-healthy foods. °· Limiting or avoiding alcohol. °· Stopping use of street drugs (illegal drugs). °· Physical activity. °Other treatments °These may include: °· Surgery to open blocked coronary arteries or repair damaged heart valves. °· Placement of a biventricular pacemaker to improve heart muscle function (cardiac resynchronization therapy). This device paces both the right ventricle and left ventricle. °· Placement of a device to treat serious abnormal heart rhythms (implantable cardioverter defibrillator, or ICD). °· Placement of a device to improve the pumping ability of the heart (left ventricular assist device, or LVAD). °· Heart transplant. This can cure heart failure, and it is considered for certain patients who do not improve with other therapies. °Follow these instructions at home: °Medicines °· Take over-the-counter and prescription medicines only as told by your health care provider. Medicines are important in reducing the workload of your heart, slowing the progression of heart failure, and improving your symptoms. °? Do not stop taking your medicine unless your health care provider told you to do that. °? Do not skip any dose of medicine. °? Refill your prescriptions before you run out of medicine. You need your medicines every day. °Eating and drinking ° °· Eat heart-healthy foods. Talk with a dietitian to make an eating plan that is right for you. °? Choose foods that contain no trans fat and are low in saturated fat and cholesterol. Healthy choices include fresh or frozen fruits and vegetables, fish, lean meats, legumes, fat-free or low-fat dairy products, and whole-grain or high-fiber foods. °? Limit salt (sodium) if directed by your  health care provider. Sodium restriction may reduce symptoms of heart failure. Ask a dietitian to recommend heart-healthy seasonings. °? Use healthy cooking methods instead of frying. Healthy methods include roasting, grilling, broiling, baking, poaching, steaming, and stir-frying. °· Limit your fluid intake if directed by your health care provider. Fluid restriction may reduce symptoms of heart failure. °Lifestyle ° °· Stop smoking or using chewing tobacco. Nicotine and tobacco can damage your heart and your blood vessels. Do not use nicotine gum or patches before talking to your health care provider. °· Limit alcohol intake to no more than 1 drink per day for non-pregnant women and 2 drinks per day for men. One drink equals 12 oz of beer, 5 oz of wine, or 1½ oz of hard liquor. °? Drinking more than that is harmful to your heart. Tell your health care provider if you drink alcohol several times a week. °? Talk with your health care provider about whether any level of alcohol use is safe for you. °? If your heart has already been damaged by alcohol or you have severe heart failure, drinking alcohol should be stopped completely. °· Stop use of illegal drugs. °· Lose weight if directed by your health care provider. Weight loss may reduce symptoms of heart failure. °· Do moderate physical activity if directed by your health care provider. People who are elderly and people with severe heart failure should consult with a health care provider for physical activity recommendations. °Monitor important information ° °· Weigh   yourself every day. Keeping track of your weight daily helps you to notice excess fluid sooner. °? Weigh yourself every morning after you urinate and before you eat breakfast. °? Wear the same amount of clothing each time you weigh yourself. °? Record your daily weight. Provide your health care provider with your weight record. °· Monitor and record your blood pressure as told by your health care  provider. °· Check your pulse as told by your health care provider. °Dealing with extreme temperatures °· If the weather is extremely hot: °? Avoid vigorous physical activity. °? Use air conditioning or fans or seek a cooler location. °? Avoid caffeine and alcohol. °? Wear loose-fitting, lightweight, and light-colored clothing. °· If the weather is extremely cold: °? Avoid vigorous physical activity. °? Layer your clothes. °? Wear mittens or gloves, a hat, and a scarf when you go outside. °? Avoid alcohol. °General instructions °· Manage other health conditions such as hypertension, diabetes, thyroid disease, or abnormal heart rhythms as told by your health care provider. °· Learn to manage stress. If you need help to do this, ask your health care provider. °· Plan rest periods when fatigued. °· Get ongoing education and support as needed. °· Participate in or seek rehabilitation as needed to maintain or improve independence and quality of life. °· Stay up to date with immunizations. Keeping current on pneumococcal and influenza immunizations is especially important to prevent respiratory infections. °· Keep all follow-up visits as told by your health care provider. This is important. °Contact a health care provider if: °· You have a rapid weight gain. °· You have increasing shortness of breath that is unusual for you. °· You are unable to participate in your usual physical activities. °· You tire easily. °· You cough more than normal, especially with physical activity. °· You have any swelling or more swelling in areas such as your hands, feet, ankles, or abdomen. °· You are unable to sleep because it is hard to breathe. °· You feel like your heart is beating quickly (palpitations). °· You become dizzy or light-headed when you stand up. °Get help right away if: °· You have difficulty breathing. °· You notice or your family notices a change in your awareness, such as having trouble staying awake or having difficulty  with concentration. °· You have pain or discomfort in your chest. °· You have an episode of fainting (syncope). °This information is not intended to replace advice given to you by your health care provider. Make sure you discuss any questions you have with your health care provider. °Document Released: 11/05/2005 Document Revised: 10/04/2017 Document Reviewed: 05/30/2016 °[employer] Interactive Patient Education © 2019 [employer] ° °

## 2018-11-19 NOTE — Progress Notes (Signed)
Patient ID: Toni [name], female   DOB: [birth year], 63 y.o.   MRN: [id number]     Advanced Heart Failure Rounding Note  PCP-Cardiologist: Belva Crome III, MD   Subjective:    She feels good this morning, no dyspnea walking around the room.  Stable labs.  SBP 100s.    Objective:   Weight Range: 75.1 kg Body mass index is 27.12 kg/m.   Vital Signs:   Temp:  [97.7 F (36.5 C)-98.4 F (36.9 C)] 98.3 F (36.8 C) (01/01 0406) Pulse Rate:  [76-80] 76 (01/01 0406) Resp:  [18-20] 18 (01/01 0406) BP: (81-102)/(47-72) 101/68 (01/01 0406) SpO2:  [96 %-99 %] 96 % (01/01 0406) Weight:  [75.1 kg] 75.1 kg (01/01 0406) Last BM Date: 11/17/18  Weight change: Filed Weights   11/17/18 0404 11/18/18 0450 11/19/18 0406  Weight: 74.7 kg 74.5 kg 75.1 kg    Intake/Output:   Intake/Output Summary (Last 24 hours) at 11/19/2018 1009 Last data filed at 11/19/2018 0857 Gross per 24 hour  Intake -  Output 1100 ml  Net -1100 ml      Physical Exam    General:  Well appearing. No resp difficulty HEENT: Normal Neck: Supple. JVP not elevated. Carotids 2+ bilat; no bruits. No lymphadenopathy or thyromegaly appreciated. Cor: PMI nondisplaced. Regular rate & rhythm. No rubs, gallops or murmurs. Lungs: Clear Abdomen: Soft, nontender, nondistended. No hepatosplenomegaly. No bruits or masses. Good bowel sounds. Extremities: No cyanosis, clubbing, rash, edema Neuro: Alert & orientedx3, cranial nerves grossly intact. moves all 4 extremities w/o difficulty. Affect pleasant   Telemetry   NSR 70s (personally reviewed)  Labs    CBC Recent Labs    11/17/18 1101 11/18/18 0515  WBC 5.3 4.8  NEUTROABS  --  2.2  HGB 13.6 13.5  HCT 42.1 42.9  MCV 91.7 91.9  PLT 228 416   Basic Metabolic Panel Recent Labs    11/18/18 0515 11/19/18 0459  NA 139 137  K 4.1 4.0  CL 108 104  CO2 23 28  GLUCOSE 173* 153*  BUN 14 16  CREATININE 0.67 0.67  CALCIUM 9.3 9.1  MG 1.7 1.9   Liver Function  Tests Recent Labs    11/18/18 0515  AST 18  ALT 19  ALKPHOS 43  BILITOT 0.8  PROT 7.2  ALBUMIN 3.3*   No results for input(s): LIPASE, AMYLASE in the last 72 hours. Cardiac Enzymes No results for input(s): CKTOTAL, CKMB, CKMBINDEX, TROPONINI in the last 72 hours.  BNP: BNP (last 3 results) Recent Labs    11/14/18 0242  BNP 520.0*    ProBNP (last 3 results) No results for input(s): PROBNP in the last 8760 hours.   D-Dimer No results for input(s): DDIMER in the last 72 hours. Hemoglobin A1C No results for input(s): HGBA1C in the last 72 hours. Fasting Lipid Panel No results for input(s): CHOL, HDL, LDLCALC, TRIG, CHOLHDL, LDLDIRECT in the last 72 hours. Thyroid Function Tests No results for input(s): TSH, T4TOTAL, T3FREE, THYROIDAB in the last 72 hours.  Invalid input(s): FREET3  Other results:   Imaging    Mr Cardiac Morphology W Wo Contrast  Result Date: 11/18/2018 CLINICAL DATA:  Nonischemic cardiomyopathy EXAM: CARDIAC MRI TECHNIQUE: The patient was scanned on a 1.5 Tesla GE magnet. A dedicated cardiac coil was used. Functional imaging was done using Fiesta sequences. 2,3, and 4 chamber views were done to assess for RWMA's. Modified Simpson's rule using a short axis stack was used to calculate an  ejection fraction on a dedicated work Conservation officer, nature. The patient received 8 cc of Gadavist. After 10 minutes inversion recovery sequences were used to assess for infiltration and scar tissue. FINDINGS: Small right pleural effusion. Moderately dilated left ventricle with normal wall thickness. EF 14%, diffuse hypokinesis. I do not see an LV thrombus. Moderate left atrial dilation. Mildly dilated right ventricle with severely decreased systolic function, EF 33%. Mild right atrial dilation. Mild tricuspid regurgitation. There is at least moderate mitral regurgitation, flow sequences to quantify were not done. MR appeared central, likely functional. Trileaflet  aortic valve with no stenosis or regurgitation. On delayed enhancement imaging, there was an area of mid-wall late gadolinium enhancement (LGE) at the inferoseptal RV insertion site. Measurements: LVEDV 294 mL LVSV 40 mL LVEF 14% RVEDV 182 mL RVSV 30 mL RVEF 17% IMPRESSION: 1.  Moderately dilated LV with EF 14%, diffuse hypokinesis. 2. Mildly dilated RV with severely decreased systolic function, EF 82%. 3. At least moderate, central mitral regurgitation (likely functional). 4. On delayed enhancement imaging, there was mid-wall LGE at the inferoseptal RV insertion site. This is nonspecific and can be seen with pressure and volume overload. [place] Electronically Signed   By: Loralie Champagne M.D.   On: 11/18/2018 15:47      Medications:     Scheduled Medications: . aspirin  81 mg Oral Daily  . carvedilol  25 mg Oral BID WC  . enoxaparin (LOVENOX) injection  40 mg Subcutaneous Q24H  . furosemide  40 mg Oral Daily  . insulin aspart  0-15 Units Subcutaneous TID WC  . insulin aspart  0-5 Units Subcutaneous QHS  . losartan  25 mg Oral Daily  . simvastatin  20 mg Oral QPM  . sodium chloride flush  3 mL Intravenous Q12H  . sodium chloride flush  3 mL Intravenous Q12H  . spironolactone  12.5 mg Oral Daily     Infusions: . sodium chloride       PRN Medications:  sodium chloride, acetaminophen, ondansetron (ZOFRAN) IV, sodium chloride flush, sodium chloride flush    Patient Profile   Toni [name] is a 63 year old [employer] employee with a history of chronic systolic heart failure, NICM, HTN, DM, and hyperlipidemia.   Admitted with increased dyspnea.    Assessment/Plan   1. Acute on chronic systolic CHF:  Initially diagnosed with NICM in 1998. At one point EF was down to 20% and later recovered in 2011. ECHO this admit showed reduced down to 15-20%. TSH is normal RHC/LHC on 12/30 with nonobstructive CAD but doesn't explain reduced EF. Suspect familial (brother with cardiomyopathy)  versus viral myocarditis with recurrent fall in EF.  No heavy ETOH or drugs. Cardiac MRI with biventricular failure, not suggestive of infiltrative disease. Though she feels good currently and cardiac output was preserved on RHC, very low LV and RV EF is concerning.  Volume status stable. SBP remains soft in the 100s range.  - Continue lasix 40 mg daily.  - Continue carvedilol 25 mg twice a day. - No ACEI or ARNI with angioedema on ACEI.  She has been on valsartan 320 mg daily at home without problems.  However, with low BP here today, will decrease her valsartan dose for home to 40 mg bid (cannot get valsartan in hospital so will give losartan 25 mg daily for now).  - Continue spironolactone 12.5 daily.  - Not CRT candidate with narrow QRS.  If EF does not improve with treatment, will need  ICD.  2. CAD: LHC 12/30 with 1st diagonal ostial to proximal 60-70% narrowing. No chest pain.   - On asa and statin.  3. Disposition: I think she can go home today.  Will need close followup with me in CHF clinic.  Meds for home: Lasix 40 mg daily, spironolactone 12.5 daily, Coreg 25 mg bid, valsartan 40 mg bid, ASA 81 daily, simvastatin 20 daily.   Length of Stay: 3  Loralie Champagne, MD  11/19/2018, 10:09 AM  Advanced Heart Failure Team Pager [phone] (M-F; 7a - 4p)  Please contact [facility] for night-coverage after hours (4p -7a ) and weekends on [email or web address]

## 2018-11-19 NOTE — Progress Notes (Signed)
Discharge teaching complete. Meds, diet, activity, follow up appointments and CHF teaching complete and all questions answered. Copy of instructions and prescriptions given to patient.

## 2018-11-20 ENCOUNTER — Other Ambulatory Visit: Payer: Self-pay | Admitting: *Deleted

## 2018-11-20 MED FILL — VALSARTAN 40 MG TABS: 40 | 30 days supply | Qty: 60 | Fill #0

## 2018-11-20 MED FILL — SIMVASTATIN 20 MG TABLET: 20 | 90 days supply | Qty: 90 | Fill #0

## 2018-11-20 MED FILL — SPIRONOLACTONE 25 MG TABLET: 25 | 60 days supply | Qty: 30 | Fill #0

## 2018-11-20 NOTE — Patient Outreach (Signed)
[facility]) Care Management  11/20/2018  Toni [name] 1956-05-22 [id number]   Transition of care telephone call:  Initial telephone call to patient's preferred number in order to complete transition of care assessment. Toni [name] answered the call but requested this RNCM call her back later today.  She was hospitalized at [facility] from 12/27-/19-11/19/18  With acute on congestive heart failure. Comorbidities include: Hypertensive cardiomegaly, CAD, HTN, Type 2 DM, and  Hyperlipidemia, She was discharged to home on 11/19/18 without the need for home health services or durable medical equipment. .  This RNCM will attempt another outreach later today per Toni [name]'s request.   Barrington Ellison RN,CCM,CDE [facility] Management Coordinator Office Phone [phone] Office Fax [phone]

## 2018-11-20 NOTE — Patient Outreach (Signed)
[facility]) Care Management  11/20/2018  MALAIJAH HOUCHEN 06-11-1956 [id number]   Transition of care call  Subjective: Initial successful telephone call to patient's preferred number in order to complete transition of care assessment;  2 HIPAA identifiers verified. Explained purpose of call and completed transition of care assessment.  Zaylia stated she is still having some shortness of breath and says she thinks it's because she is now aware that her symptoms prior to her hospitalization were related to her heart and not bronchitis as she originally thought.  She accurately described the course of her hospitalization and her ejection fraction that was measured during her 2 D cardiac echo and cardiac catherization.   Objective:  Renley Gutman was hospitalized at [facility] 11/14/18 - 11/19/2018 for  congestive heart failure. Comorbidities include: Type 2 diabetes, HTN, CAD, and Hyperlipidemia  She was discharged to home on 11/19/18 without the need for home health services.  Assessment:  See transition of care flowsheet for assessment details. .   Plan:  No care management needs identified so will close case to [facility] care management services and route successful outreach letter to [facility] clinical pool to be mailed to patient's home address

## 2018-11-28 ENCOUNTER — Encounter (HOSPITAL_COMMUNITY): Payer: Self-pay | Admitting: Cardiology

## 2018-11-28 ENCOUNTER — Encounter (HOSPITAL_COMMUNITY): Payer: Self-pay

## 2018-11-28 ENCOUNTER — Ambulatory Visit (HOSPITAL_COMMUNITY)
Admission: RE | Admit: 2018-11-28 | Discharge: 2018-11-28 | Disposition: A | Discharge status: Home / Self Care | Payer: No Typology Code available for payment source | Source: Ambulatory Visit | Attending: Cardiology | Admitting: Cardiology

## 2018-11-28 VITALS — BP 102/70 | HR 83 | Wt 160.4 lb

## 2018-11-28 DIAGNOSIS — E785 Hyperlipidemia, unspecified: Secondary | ICD-10-CM | POA: Diagnosis not present

## 2018-11-28 DIAGNOSIS — E119 Type 2 diabetes mellitus without complications: Secondary | ICD-10-CM | POA: Insufficient documentation

## 2018-11-28 DIAGNOSIS — Z79899 Other long term (current) drug therapy: Secondary | ICD-10-CM | POA: Diagnosis not present

## 2018-11-28 DIAGNOSIS — I110 Hypertensive heart disease with heart failure: Secondary | ICD-10-CM | POA: Insufficient documentation

## 2018-11-28 DIAGNOSIS — Z833 Family history of diabetes mellitus: Secondary | ICD-10-CM | POA: Insufficient documentation

## 2018-11-28 DIAGNOSIS — Z8249 Family history of ischemic heart disease and other diseases of the circulatory system: Secondary | ICD-10-CM | POA: Insufficient documentation

## 2018-11-28 DIAGNOSIS — Z7982 Long term (current) use of aspirin: Secondary | ICD-10-CM | POA: Insufficient documentation

## 2018-11-28 DIAGNOSIS — Z7984 Long term (current) use of oral hypoglycemic drugs: Secondary | ICD-10-CM | POA: Diagnosis not present

## 2018-11-28 DIAGNOSIS — I428 Other cardiomyopathies: Secondary | ICD-10-CM | POA: Diagnosis not present

## 2018-11-28 DIAGNOSIS — I5022 Chronic systolic (congestive) heart failure: Secondary | ICD-10-CM | POA: Diagnosis not present

## 2018-11-28 DIAGNOSIS — I509 Heart failure, unspecified: Secondary | ICD-10-CM

## 2018-11-28 LAB — BASIC METABOLIC PANEL
Anion gap: 7 (ref 5–15)
BUN: 19 mg/dL (ref 8–23)
CO2: 27 mmol/L (ref 22–32)
Calcium: 10 mg/dL (ref 8.9–10.3)
Chloride: 104 mmol/L (ref 98–111)
Creatinine, Ser: 0.83 mg/dL (ref 0.44–1.00)
GFR calc Af Amer: >60 mL/min (ref >60)
GFR calc non Af Amer: >60 mL/min (ref >60)
Glucose, Bld: 134 mg/dL — ABNORMAL HIGH (ref 70–99)
Potassium: 4.2 mmol/L (ref 3.5–5.1)
Sodium: 138 mmol/L (ref 135–145)

## 2018-11-28 MED ORDER — SPIRONOLACTONE 25 MG PO TABS: 25.0000 mg | ORAL_TABLET | Freq: Every evening | ORAL | 0 refills | Status: DC

## 2018-11-28 NOTE — Patient Instructions (Addendum)
Labs done today  Labs need to be done in 7-10 days  INCREASE Spironolactone 25mg  (1 tab) every evening  Follow up with the Advanced Practice Provider in 3 weeks

## 2018-11-30 NOTE — Progress Notes (Signed)
PCP: Dr. Nancy Fetter Cardiology: Dr. Tamala Julian HF Cardiology: Dr. Aundra Dubin  [name] is a 63 year old [employer] employee with a history of chronic systolic heart failure, due to nonischemic cardiomyopathy, HTN, DM, and hyperlipidemia.   Initially diagnosed with NICM in 1998 thought to be from HTN versus viral. Had cath in 1998 that was negative for coronary disease. EF at that time was 20% but EF recovered in 2012.   In October 2019, she had a cough/virus and she took OTC meds. Says she would feel better for a little while but then felt bad again. 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  She presented to [facility] ED on 11/13/18 with increased shortness of breath. She was admitted and echo was completed showing EF had gone back down to 15%. She had RHC/LHC with nonobstructive CAD and relatively preserved cardiac output. She was diuresed in the hospital and discharged.   She returns today for followup of CHF.  She has been fatigued since getting back home.  Occasional dizziness if she stands up too fast.  No dyspnea walking around house or walking into the office today.  She has not done much other activity since getting home. No chest pain. No orthopnea/PND.     ECG (12/19, personally reviewed): NSR, nonspecific T wave flattening, narrow QRS  Labs (1/20): K 4, creatinine 0.67  PMH: 1. HTN 2. Type 2 diabetes 3. Hyperlipidemia 4. Chronic systolic CHF: Nonischemic cardiomyopathy.  Diagnosed in 1998, EF 20% by echo at that time.  Echo back to normal range by 2012.   - LHC/RHC (12/19): D1 60-70% stenosis; mean RA 6, PA 58/22, mean PCWP 22, CI 2.9.  - Echo (12/19): EF 15% with severe LV dilation, moderate central MR likely functional.  - Cardiac MRI (12/19): Moderate LV dilation with EF 14%, mild RV dilation with EF 17%, LGE at the inferior RV insertion site (nonspecific).   Social History    Socioeconomic History  . Marital status: Single    Spouse name: Not on file  . Number of children: Not on file  . Years of education: Not on file  . Highest education level: Not on file  Occupational History  . Occupation: Surveyor, quantity: [employer]  . Financial resource strain: Not on file  . Food insecurity:    Worry: Not on file    Inability: Not on file  . Transportation needs:    Medical: Not on file    Non-medical: Not on file  Tobacco Use  . Smoking status: Never Smoker  . Smokeless tobacco: Never Used  Substance and Sexual Activity  . Alcohol use: Yes    Alcohol/week: 0.0 standard drinks    Comment: less than once a month  . Drug use: No  . Sexual activity: Never  Lifestyle  . Physical activity:    Days per week: Not on file    Minutes per session: Not on file  . Stress: Not on file  Relationships  . Social connections:    Talks on phone: Not on file    Gets together: Not on file    Attends religious service: Not on file    Active member of club or organization: Not on file    Attends meetings of clubs or organizations: Not on file    Relationship status: Not on file  . Intimate partner  violence:    Fear of current or ex partner: Not on file    Emotionally abused: Not on file    Physically abused: Not on file    Forced sexual activity: Not on file  Other Topics Concern  . Not on file  Social History Narrative   Works at [employer].  Lives alone.     Family History  Problem Relation Age of Onset  . Diabetes Mellitus I Mother   . Lung cancer Mother   . Diabetes Mellitus I Father   . Sudden death Father 77  . Hypertension Brother   . Hypertension Brother   . Diabetes Mellitus I Brother   . Benign prostatic hyperplasia Brother   . Heart failure Paternal Uncle    ROS: All systems reviewed and negative except as per HPI.   Current Outpatient Medications  Medication Sig Dispense Refill  . aspirin 81 MG chewable tablet  Chew 1 tablet (81 mg total) by mouth daily. 30 tablet 0  . carvedilol (COREG) 25 MG tablet Take 1 tablet (25 mg total) by mouth 2 (two) times daily with a meal. 30 tablet 0  . dapagliflozin propanediol (FARXIGA) 5 MG TABS tablet Take 5 mg by mouth daily.    . furosemide (LASIX) 20 MG tablet Take 2 tablets (40 mg total) by mouth daily. 30 tablet 0  . glipiZIDE (GLUCOTROL XL) 10 MG 24 hr tablet Take 10 mg by mouth 2 (two) times daily with a meal.     . Liraglutide (VICTOZA [place]) Inject 1.8 mg into the skin every evening.     . metFORMIN (GLUCOPHAGE) 1000 MG tablet Take 1,000 mg by mouth 2 (two) times daily with a meal.    . Multiple Vitamins-Minerals (CENTRUM SILVER PO) Take 1 tablet by mouth daily. Reported on 02/10/2016    . simvastatin (ZOCOR) 20 MG tablet Take 20 mg by mouth every evening.    Marland Kitchen spironolactone (ALDACTONE) 25 MG tablet Take 1 tablet (25 mg total) by mouth every evening. 30 tablet 0  . TRUE METRIX BLOOD GLUCOSE TEST test strip Use as directed.  5  . TRUEPLUS LANCETS 30G MISC Use as directed.  5  . valsartan (DIOVAN) 40 MG tablet Take 1 tablet (40 mg total) by mouth 2 (two) times daily. 60 tablet 0   No current facility-administered medications for this encounter.    BP 102/70   Pulse 83   Wt 72.8 kg (160 lb 6.4 oz)   SpO2 98%   BMI 26.29 kg/m  General: NAD Neck: No JVD, no thyromegaly or thyroid nodule.  Lungs: Clear to auscultation bilaterally with normal respiratory effort. CV: Nondisplaced PMI.  Heart regular S1/S2, no S3/S4, no murmur.  No peripheral edema.  No carotid bruit.  Normal pedal pulses.  Abdomen: Soft, nontender, no hepatosplenomegaly, no distention.  Skin: Intact without lesions or rashes.  Neurologic: Alert and oriented x 3.  Psych: Normal affect. Extremities: No clubbing or cyanosis.  HEENT: Normal.   Assessment/Plan: 1. Chronic systolic CHF: Nonischemic cardiomyopathy by 12/19 cath.  Cardiac MRI with LV EF 14%, RV EF 17%. No definite evidence for  myocarditis or infiltrative disease by delayed enhancement images.  Most likely cause of cardiomyopathy is familial versus prior viral myocarditis.  Brother also had a cardiomyopathy of uncertain etiology.  On exam, she is not volume overloaded.  Probably NYHA class II symptoms.  Narrow QRS, not CRT candidate.  - Continue Coreg 25 mg bid.  - Continue valsartan 40 mg bid, I do  not think that she has BP room currently to transition to [facility].  - Continue dapagliflozin.  - Increase spironolactone to 25 mg daily, take in the evening.  - BMET today and again in 10 days since increasing spironolactone.  - Eventually, she will need CPX.  - I will refer her for cardiac rehab.  - Repeat echo at 6 months, if EF remains low will need ICD.  - We discussed genetic testing given brother with NICM, she would be interested in testing as she would like to offer screening to her close relatives if a mutation associated with cardiomyopathy is found.  2. Type II diabetes: She is on dapagliflozin.   Followup in 3 wks with NP/PA.   Loralie Champagne 11/30/2018

## 2018-12-05 ENCOUNTER — Telehealth (HOSPITAL_COMMUNITY): Payer: Self-pay

## 2018-12-05 ENCOUNTER — Ambulatory Visit (HOSPITAL_COMMUNITY)
Admission: RE | Admit: 2018-12-05 | Discharge: 2018-12-05 | Disposition: A | Discharge status: Home / Self Care | Payer: No Typology Code available for payment source | Source: Ambulatory Visit | Attending: Internal Medicine | Admitting: Internal Medicine

## 2018-12-05 DIAGNOSIS — I509 Heart failure, unspecified: Secondary | ICD-10-CM | POA: Insufficient documentation

## 2018-12-05 LAB — BASIC METABOLIC PANEL
Anion gap: 10 (ref 5–15)
BUN: 10 mg/dL (ref 8–23)
CALCIUM: 9.4 mg/dL (ref 8.9–10.3)
CO2: 30 mmol/L (ref 22–32)
Chloride: 102 mmol/L (ref 98–111)
Creatinine, Ser: 0.74 mg/dL (ref 0.44–1.00)
GFR calc Af Amer: >60 mL/min (ref >60)
GFR calc non Af Amer: >60 mL/min (ref >60)
Glucose, Bld: 131 mg/dL — ABNORMAL HIGH (ref 70–99)
Potassium: 3.9 mmol/L (ref 3.5–5.1)
SODIUM: 142 mmol/L (ref 135–145)

## 2018-12-05 MED FILL — SM BLOOD PRESSURE MONITOR: 1 days supply | Qty: 1 | Fill #0

## 2018-12-05 NOTE — Telephone Encounter (Signed)
Pt came by to pick up her disability paperwork. Fax came in requesting additional information. Last office visit and diagnostics sent via fax.

## 2018-12-08 ENCOUNTER — Encounter: Payer: Self-pay | Admitting: Cardiology

## 2018-12-10 MED FILL — metFORMIN HCL 1000 MG TABS: 1000 | 90 days supply | Qty: 180 | Fill #1

## 2018-12-11 ENCOUNTER — Other Ambulatory Visit (HOSPITAL_COMMUNITY): Payer: Self-pay | Admitting: *Deleted

## 2018-12-11 DIAGNOSIS — I429 Cardiomyopathy, unspecified: Secondary | ICD-10-CM

## 2018-12-11 MED FILL — CARVEDILOL 25 MG TABLET: 25 | 90 days supply | Qty: 360 | Fill #1

## 2018-12-11 MED FILL — LINZESS 145 MCG CAPSULE: 145 | 90 days supply | Qty: 90 | Fill #0

## 2018-12-12 ENCOUNTER — Telehealth (HOSPITAL_COMMUNITY): Payer: Self-pay

## 2018-12-12 NOTE — Telephone Encounter (Signed)
Pt insurance is active and benefits verified through [facility]. Co-pay $0.00, DED $0.00/$0.00 met, out of pocket $2,500.00/$0.00 met, co-insurance 0%. No pre-authorization required. [facility], 12/12/2018 @ 2:03PM, REF# [id number]

## 2018-12-17 NOTE — Telephone Encounter (Signed)
Called patient to see if she was interested in participating in the Cardiac Rehab Program. Patient stated yes. Patient will come in for orientation on 01/15/2019 @ 8AM and will attend the 945AM exercise class. Went over insurance, patient verbalized understanding.   Mailed homework package.

## 2018-12-18 NOTE — Progress Notes (Signed)
PCP: Dr. Nancy Fetter Cardiology: Dr. Tamala Julian HF Cardiology: Dr. Aundra Dubin  Toni [name] is a 63 year old [place] employee with a history of chronic systolic heart failure, due to nonischemic cardiomyopathy, HTN, DM, and hyperlipidemia.   Initially diagnosed with NICM in 1998 thought to be from HTN versus viral. Had cath in 1998 that was negative for coronary disease. EF at that time was 20% but EF recovered in 2012.   In October 2019, she had a cough/virus and she took OTC meds. Says she would feel better for a little while but then felt bad again. 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  She presented to [facility] ED on 11/13/18 with increased shortness of breath. She was admitted and echo was completed showing EF had gone back down to 15%. She had RHC/LHC with nonobstructive CAD and relatively preserved cardiac output. She was diuresed in the hospital and discharged.   Today she returns for HF follow up. Overall feeling fair. Complaining of dizziness and fatigue. Gets tired and has mild dyspnea after walking. Denies PND/Orhtopnea. Appetite ok. No fever or chills. Weight at home 156-157 pounds. Taking all medications. Currently not working. Lives alone.   ECG (12/19, personally reviewed): NSR, nonspecific T wave flattening, narrow QRS  Labs (1/20): K 4, creatinine 0.67  PMH: 1. HTN 2. Type 2 diabetes 3. Hyperlipidemia 4. Chronic systolic CHF: Nonischemic cardiomyopathy.  Diagnosed in 1998, EF 20% by echo at that time.  Echo back to normal range by 2012.   - LHC/RHC (12/19): D1 60-70% stenosis; mean RA 6, PA 58/22, mean PCWP 22, CI 2.9.  - Echo (12/19): EF 15% with severe LV dilation, moderate central MR likely functional.  - Cardiac MRI (12/19): Moderate LV dilation with EF 14%, mild RV dilation with EF 17%, LGE at the inferior RV insertion site (nonspecific).   Social History   Socioeconomic  History  . Marital status: Single    Spouse name: Not on file  . Number of children: Not on file  . Years of education: Not on file  . Highest education level: Not on file  Occupational History  . Occupation: Surveyor, quantity: [employer]  . Financial resource strain: Not on file  . Food insecurity:    Worry: Not on file    Inability: Not on file  . Transportation needs:    Medical: Not on file    Non-medical: Not on file  Tobacco Use  . Smoking status: Never Smoker  . Smokeless tobacco: Never Used  Substance and Sexual Activity  . Alcohol use: Yes    Alcohol/week: 0.0 standard drinks    Comment: less than once a month  . Drug use: No  . Sexual activity: Never  Lifestyle  . Physical activity:    Days per week: Not on file    Minutes per session: Not on file  . Stress: Not on file  Relationships  . Social connections:    Talks on phone: Not on file    Gets together: Not on file    Attends religious service: Not on file    Active member of club or organization: Not on file    Attends meetings of clubs or organizations: Not on file    Relationship status: Not on file  . Intimate partner violence:    Fear of current or ex partner: Not  on file    Emotionally abused: Not on file    Physically abused: Not on file    Forced sexual activity: Not on file  Other Topics Concern  . Not on file  Social History Narrative   Works at [employer].  Lives alone.     Family History  Problem Relation Age of Onset  . Diabetes Mellitus I Mother   . Lung cancer Mother   . Diabetes Mellitus I Father   . Sudden death Father 71  . Hypertension Brother   . Hypertension Brother   . Diabetes Mellitus I Brother   . Benign prostatic hyperplasia Brother   . Heart failure Paternal Uncle    ROS: All systems reviewed and negative except as per HPI.   Current Outpatient Medications  Medication Sig Dispense Refill  . aspirin 81 MG chewable tablet Chew 1 tablet (81 mg  total) by mouth daily. 30 tablet 0  . carvedilol (COREG) 25 MG tablet Take 1 tablet (25 mg total) by mouth 2 (two) times daily with a meal. 30 tablet 0  . dapagliflozin propanediol (FARXIGA) 5 MG TABS tablet Take 5 mg by mouth daily.    . furosemide (LASIX) 20 MG tablet Take 2 tablets (40 mg total) by mouth daily. 30 tablet 0  . glipiZIDE (GLUCOTROL XL) 10 MG 24 hr tablet Take 10 mg by mouth 2 (two) times daily with a meal.     . Liraglutide (VICTOZA [place]) Inject 1.8 mg into the skin every evening.     . metFORMIN (GLUCOPHAGE) 1000 MG tablet Take 1,000 mg by mouth 2 (two) times daily with a meal.    . Multiple Vitamins-Minerals (CENTRUM SILVER PO) Take 1 tablet by mouth daily. Reported on 02/10/2016    . simvastatin (ZOCOR) 20 MG tablet Take 20 mg by mouth every evening.    Marland Kitchen spironolactone (ALDACTONE) 25 MG tablet Take 1 tablet (25 mg total) by mouth every evening. 30 tablet 0  . valsartan (DIOVAN) 40 MG tablet Take 1 tablet (40 mg total) by mouth 2 (two) times daily. 60 tablet 0  . TRUE METRIX BLOOD GLUCOSE TEST test strip Use as directed.  5  . TRUEPLUS LANCETS 30G MISC Use as directed.  5   No current facility-administered medications for this encounter.    BP 110/70 (Patient Position: Standing)   Pulse 94   Wt 73.1 kg (161 lb 3.2 oz)   SpO2 96%   BMI 26.42 kg/m  Wt Readings from Last 3 Encounters:  12/19/18 73.1 kg (161 lb 3.2 oz)  11/28/18 72.8 kg (160 lb 6.4 oz)  11/19/18 75.1 kg (165 lb 8 oz)   General:  Well appearing. No resp difficulty HEENT: normal Neck: supple. no JVD. Carotids 2+ bilat; no bruits. No lymphadenopathy or thryomegaly appreciated. Cor: PMI nondisplaced. Regular rate & rhythm. No rubs, gallops or murmurs. Lungs: clear Abdomen: soft, nontender, nondistended. No hepatosplenomegaly. No bruits or masses. Good bowel sounds. Extremities: no cyanosis, clubbing, rash, edema Neuro: alert & orientedx3, cranial nerves grossly intact. moves all 4 extremities w/o  difficulty. Affect pleasant  Assessment/Plan: 1. Chronic systolic CHF: Nonischemic cardiomyopathy by 12/19 cath.  Cardiac MRI with LV EF 14%, RV EF 17%. No definite evidence for myocarditis or infiltrative disease by delayed enhancement images.  Most likely cause of cardiomyopathy is familial versus prior viral myocarditis.  Brother also had a cardiomyopathy of uncertain etiology..  Narrow QRS, not CRT candidate.  - NYHA IIIb. Volume status stable. Continue lasix 40 mg daily.  -  Continue Coreg 25 mg bid. Add 0.125 mg digoxin daily.  - Continue valsartan 40 mg bid. No entresto with history of angioedema.    - Continue dapagliflozin.  - Continue  spironolactone to 25 mg daily, take in the evening.  -- Set up for CPX. Start cardiac rehab in February.  - Repeat echo at 6 months, if EF remains low will need ICD.  - We discussed genetic testing given brother with NICM, she would be interested in testing as she would like to offer screening to her close relatives if a mutation associated with cardiomyopathy is found.  -Refer to Dr Cyndie Mull  2. Type II diabetes: She is on dapagliflozin.   Follow up in 2 weeks for CPX test and 6 weeks with Dr Aundra Dubin. Plan to check dig level and bmet at that time. Repeat ECHO in May.   Documents completed for work.  Greater than 50% of the (total minutes 25) visit spent in counseling/coordination of care regarding medications changes and testing.   Ranulfo Kall NP-C  12/19/2018

## 2018-12-19 ENCOUNTER — Ambulatory Visit (HOSPITAL_COMMUNITY)
Admission: RE | Admit: 2018-12-19 | Discharge: 2018-12-19 | Disposition: A | Discharge status: Home / Self Care | Payer: No Typology Code available for payment source | Source: Ambulatory Visit | Attending: Cardiology | Admitting: Cardiology

## 2018-12-19 ENCOUNTER — Other Ambulatory Visit: Payer: Self-pay

## 2018-12-19 ENCOUNTER — Encounter (HOSPITAL_COMMUNITY): Payer: Self-pay

## 2018-12-19 VITALS — BP 110/70 | HR 94 | Wt 161.2 lb

## 2018-12-19 DIAGNOSIS — Z8249 Family history of ischemic heart disease and other diseases of the circulatory system: Secondary | ICD-10-CM | POA: Insufficient documentation

## 2018-12-19 DIAGNOSIS — E119 Type 2 diabetes mellitus without complications: Secondary | ICD-10-CM | POA: Diagnosis not present

## 2018-12-19 DIAGNOSIS — Z833 Family history of diabetes mellitus: Secondary | ICD-10-CM | POA: Diagnosis not present

## 2018-12-19 DIAGNOSIS — I509 Heart failure, unspecified: Secondary | ICD-10-CM

## 2018-12-19 DIAGNOSIS — E785 Hyperlipidemia, unspecified: Secondary | ICD-10-CM | POA: Diagnosis not present

## 2018-12-19 DIAGNOSIS — Z7982 Long term (current) use of aspirin: Secondary | ICD-10-CM | POA: Insufficient documentation

## 2018-12-19 DIAGNOSIS — I5022 Chronic systolic (congestive) heart failure: Secondary | ICD-10-CM | POA: Diagnosis present

## 2018-12-19 DIAGNOSIS — I110 Hypertensive heart disease with heart failure: Secondary | ICD-10-CM | POA: Diagnosis not present

## 2018-12-19 DIAGNOSIS — Z79899 Other long term (current) drug therapy: Secondary | ICD-10-CM | POA: Diagnosis not present

## 2018-12-19 DIAGNOSIS — Z7984 Long term (current) use of oral hypoglycemic drugs: Secondary | ICD-10-CM | POA: Diagnosis not present

## 2018-12-19 DIAGNOSIS — I428 Other cardiomyopathies: Secondary | ICD-10-CM | POA: Diagnosis not present

## 2018-12-19 MED ORDER — DIGOXIN 125 MCG PO TABS: 0.1250 mg | ORAL_TABLET | Freq: Every day | ORAL | 3 refills | Status: DC

## 2018-12-19 MED FILL — DIGOXIN 0.125 MG TABLET: 125 | 90 days supply | Qty: 90 | Fill #0

## 2018-12-19 MED FILL — VICTOZA 18 MG/3 ML INJECT P: 18 | 90 days supply | Qty: 27 | Fill #1

## 2018-12-19 NOTE — Patient Instructions (Signed)
START Digoxin 0.125mg  (1 tab)  Your physician has recommended that you have a cardiopulmonary stress test (CPX). CPX testing is a non-invasive measurement of heart and lung function. It replaces a traditional treadmill stress test. This type of test provides a tremendous amount of information that relates not only to your present condition but also for future outcomes. This test combines measurements of you ventilation, respiratory gas exchange in the lungs, electrocardiogram (EKG), blood pressure and physical response before, during, and following an exercise protocol.  Follow up with Dr. Aundra Dubin in 6 weeks

## 2018-12-22 ENCOUNTER — Other Ambulatory Visit (HOSPITAL_COMMUNITY): Payer: Self-pay

## 2018-12-22 MED ORDER — VALSARTAN 40 MG PO TABS: 40.0000 mg | ORAL_TABLET | Freq: Two times a day (BID) | ORAL | 6 refills | Status: DC

## 2018-12-22 MED FILL — VALSARTAN 40 MG TABLET: 40 | 30 days supply | Qty: 60 | Fill #0 | Status: TO

## 2018-12-22 MED FILL — SPIRONOLACTONE 25 MG TABLET: 25 | 30 days supply | Qty: 30 | Fill #0

## 2018-12-22 MED FILL — FUROSEMIDE 20 MG TABS: 20 | 30 days supply | Qty: 60 | Fill #0

## 2018-12-31 ENCOUNTER — Telehealth (HOSPITAL_COMMUNITY): Payer: Self-pay

## 2018-12-31 NOTE — Telephone Encounter (Signed)
Disability claim faxed to [facility]

## 2019-01-02 ENCOUNTER — Ambulatory Visit (HOSPITAL_COMMUNITY): Payer: No Typology Code available for payment source | Attending: Internal Medicine

## 2019-01-02 DIAGNOSIS — I509 Heart failure, unspecified: Secondary | ICD-10-CM | POA: Diagnosis not present

## 2019-01-02 DIAGNOSIS — I5022 Chronic systolic (congestive) heart failure: Secondary | ICD-10-CM

## 2019-01-02 MED FILL — FARXIGA 10 MG TABLET: 10 | 90 days supply | Qty: 90 | Fill #1

## 2019-01-06 ENCOUNTER — Telehealth (HOSPITAL_COMMUNITY): Payer: Self-pay

## 2019-01-06 NOTE — Telephone Encounter (Signed)
-----   Message from Conrad Motley, NP sent at 01/06/2019  2:34 PM EST ----- Please call. No significant HF limitation. We could refer to to cardiac rehab by Dr Aundra Dubin.

## 2019-01-06 NOTE — Telephone Encounter (Signed)
Left voicemail to give pt results of CPX

## 2019-01-07 ENCOUNTER — Telehealth (HOSPITAL_COMMUNITY): Payer: Self-pay

## 2019-01-07 NOTE — Telephone Encounter (Signed)
-----   Message from Conrad Denning, NP sent at 01/06/2019  2:34 PM EST ----- Please call. No significant HF limitation. We could refer to to cardiac rehab by Dr Aundra Dubin.

## 2019-01-07 NOTE — Telephone Encounter (Signed)
Pt arrived to clinic to receive results and copy of CPX test for her long term disability claim.

## 2019-01-09 ENCOUNTER — Telehealth (HOSPITAL_COMMUNITY): Payer: Self-pay | Admitting: Pharmacist

## 2019-01-09 NOTE — Progress Notes (Signed)
Toni [name] 63 y.o. female DOB 1956/01/10 MRN [id number]       Nutrition Screen Note  No diagnosis found. Past Medical History:  Diagnosis Date  . CHF (congestive heart failure) ([place])   . Diabetes mellitus without complication ([place])   . Hypertension    Meds reviewed.    Current Outpatient Medications (Endocrine & Metabolic):  .  dapagliflozin propanediol (FARXIGA) 5 MG TABS tablet, Take 5 mg by mouth daily. Marland Kitchen  glipiZIDE (GLUCOTROL XL) 10 MG 24 hr tablet, Take 10 mg by mouth 2 (two) times daily with a meal.  .  Liraglutide (VICTOZA [place]), Inject 1.8 mg into the skin every evening.  .  metFORMIN (GLUCOPHAGE) 1000 MG tablet, Take 1,000 mg by mouth 2 (two) times daily with a meal.  Current Outpatient Medications (Cardiovascular):  .  carvedilol (COREG) 25 MG tablet, Take 1 tablet (25 mg total) by mouth 2 (two) times daily with a meal. .  digoxin (LANOXIN) 0.125 MG tablet, Take 1 tablet (0.125 mg total) by mouth daily. .  furosemide (LASIX) 20 MG tablet, Take 2 tablets (40 mg total) by mouth daily. .  simvastatin (ZOCOR) 20 MG tablet, Take 20 mg by mouth every evening. Marland Kitchen  spironolactone (ALDACTONE) 25 MG tablet, Take 1 tablet (25 mg total) by mouth every evening. .  valsartan (DIOVAN) 40 MG tablet, Take 1 tablet (40 mg total) by mouth 2 (two) times daily.   Current Outpatient Medications (Analgesics):  .  aspirin 81 MG chewable tablet, Chew 1 tablet (81 mg total) by mouth daily.   Current Outpatient Medications (Other):  Marland Kitchen  Multiple Vitamins-Minerals (CENTRUM SILVER PO), Take 1 tablet by mouth daily. Reported on 02/10/2016 .  TRUE METRIX BLOOD GLUCOSE TEST test strip, Use as directed. .  TRUEPLUS LANCETS 30G MISC, Use as directed.   HT: Ht Readings from Last 1 Encounters:  11/14/18 5' 5.5" (1.664 m)    WT: Wt Readings from Last 5 Encounters:  12/19/18 161 lb 3.2 oz (73.1 kg)  11/28/18 160 lb 6.4 oz (72.8 kg)  11/19/18 165 lb 8 oz (75.1 kg)     BMI = 26.42   Current  tobacco use? No       Labs:  Lipid Panel     Component Value Date/Time   CHOL 182 11/15/2018 0026   TRIG 134 11/15/2018 0026   HDL 38 (L) 11/15/2018 0026   CHOLHDL 4.8 11/15/2018 0026   VLDL 27 11/15/2018 0026   LDLCALC 117 (H) 11/15/2018 0026    Lab Results  Component Value Date   HGBA1C 6.9 (H) 11/14/2018   CBG (last 3)  No results for input(s): GLUCAP in the last 72 hours.  Nutrition Diagnosis ? Food-and nutrition-related knowledge deficit related to lack of exposure to information as related to diagnosis of: ? CVD ? Type 2 Diabetes  Nutrition Goal(s):  ? To be determined  Plan:  Pt to attend nutrition classes ? Nutrition I ? Nutrition II ? Portion Distortion  ? Diabetes Blitz ? Diabetes Q & A Will provide client-centered nutrition education as part of interdisciplinary care.   Monitor and evaluate progress toward nutrition goal with team.  Laurina Bustle, MS, RD, LDN 01/09/2019 2:38 PM

## 2019-01-09 NOTE — Telephone Encounter (Signed)
Cardiac Rehab Medication Review by a Pharmacist  Does the patient  feel that his/her medications are working for him/her?  yes  Has the patient been experiencing any side effects to the medications prescribed?  no  Does the patient measure his/her own blood pressure or blood glucose at home?  yes   Does the patient have any problems obtaining medications due to transportation or finances?   no  Understanding of regimen: good Understanding of indications: good Potential of compliance: good    Pharmacist comments: patient stated she has been getting constipated but unsure of what is causing it.    Thank you for allowing pharmacy to be a part of this patient's care.  Tamela Gammon, PharmD 01/09/2019 6:59 PM PGY-1 Pharmacy Resident Direct Phone: [phone] Please check [email or web address] for unit-specific pharmacist phone numbers

## 2019-01-15 ENCOUNTER — Encounter (HOSPITAL_COMMUNITY)
Admission: RE | Admit: 2019-01-15 | Discharge: 2019-01-15 | Disposition: A | Discharge status: Home / Self Care | Payer: No Typology Code available for payment source | Source: Ambulatory Visit | Attending: Cardiology | Admitting: Cardiology

## 2019-01-15 ENCOUNTER — Encounter (HOSPITAL_COMMUNITY): Payer: Self-pay

## 2019-01-15 VITALS — BP 102/70 | HR 68 | Ht 65.5 in | Wt 160.3 lb

## 2019-01-15 DIAGNOSIS — I5022 Chronic systolic (congestive) heart failure: Secondary | ICD-10-CM | POA: Diagnosis not present

## 2019-01-15 NOTE — Progress Notes (Signed)
Cardiac Individual Treatment Plan  Patient Details  Name: Toni [name] MRN: [id number] Date of Birth: August 11, 1956 Referring Provider:     CARDIAC REHAB PHASE II ORIENTATION from 01/15/2019 in [facility]  Referring Provider  Dr. Aundra Dubin      Initial Encounter Date:    CARDIAC REHAB PHASE II ORIENTATION from 01/15/2019 in [place]  Date  01/15/19      Visit Diagnosis: Chronic systolic CHF (congestive heart failure) ([place])  Patient's Home Medications on Admission:  Current Outpatient Medications:  .  aspirin 81 MG chewable tablet, Chew 1 tablet (81 mg total) by mouth daily., Disp: 30 tablet, Rfl: 0 .  carvedilol (COREG) 25 MG tablet, Take 1 tablet (25 mg total) by mouth 2 (two) times daily with a meal., Disp: 30 tablet, Rfl: 0 .  dapagliflozin propanediol (FARXIGA) 5 MG TABS tablet, Take 5 mg by mouth daily., Disp: , Rfl:  .  digoxin (LANOXIN) 0.125 MG tablet, Take 1 tablet (0.125 mg total) by mouth daily., Disp: 90 tablet, Rfl: 3 .  furosemide (LASIX) 20 MG tablet, Take 2 tablets (40 mg total) by mouth daily., Disp: 30 tablet, Rfl: 0 .  glipiZIDE (GLUCOTROL XL) 10 MG 24 hr tablet, Take 10 mg by mouth 2 (two) times daily with a meal. , Disp: , Rfl:  .  Liraglutide (VICTOZA [place]), Inject 1.8 mg into the skin every evening. , Disp: , Rfl:  .  metFORMIN (GLUCOPHAGE) 1000 MG tablet, Take 1,000 mg by mouth 2 (two) times daily with a meal., Disp: , Rfl:  .  Multiple Vitamins-Minerals (CENTRUM SILVER PO), Take 1 tablet by mouth daily. Reported on 02/10/2016, Disp: , Rfl:  .  simvastatin (ZOCOR) 20 MG tablet, Take 20 mg by mouth every evening., Disp: , Rfl:  .  spironolactone (ALDACTONE) 25 MG tablet, Take 1 tablet (25 mg total) by mouth every evening., Disp: 30 tablet, Rfl: 0 .  TRUE METRIX BLOOD GLUCOSE TEST test strip, Use as directed., Disp: , Rfl: 5 .  TRUEPLUS LANCETS 30G MISC, Use as directed., Disp: , Rfl: 5 .  valsartan  (DIOVAN) 40 MG tablet, Take 1 tablet (40 mg total) by mouth 2 (two) times daily., Disp: 60 tablet, Rfl: 6  Past Medical History: Past Medical History:  Diagnosis Date  . CHF (congestive heart failure) ([place])   . Diabetes mellitus without complication ([place])   . Hypertension     Tobacco Use: Social History   Tobacco Use  Smoking Status Never Smoker  Smokeless Tobacco Never Used    Labs: Recent Review Flowsheet Data    Labs for ITP Cardiac and Pulmonary Rehab Latest Ref Rng & Units 10/26/2016 11/14/2018 11/15/2018 11/17/2018 11/17/2018   Cholestrol 0 - 200 mg/dL - - 182 - -   LDLCALC 0 - 99 mg/dL - - 117(H) - -   HDL >40 mg/dL - - 38(L) - -   Trlycerides <150 mg/dL - - 134 - -   Hemoglobin A1c 4.8 - 5.6 % 7.6 6.9(H) - - -   PHART 7.350 - 7.450 - - - - 7.307(L)   PCO2ART 32.0 - 48.0 mmHg - - - - 43.9   HCO3 20.0 - 28.0 mmol/L - - - 25.5 22.0   TCO2 22 - 32 mmol/L - - - 27 23   ACIDBASEDEF 0.0 - 2.0 mmol/L - - - 1.0 4.0(H)   O2SAT % - - - 66.0 93.0      Capillary Blood  Glucose: Lab Results  Component Value Date   GLUCAP 141 (H) 11/19/2018   GLUCAP 188 (H) 11/18/2018   GLUCAP 154 (H) 11/18/2018   GLUCAP 236 (H) 11/18/2018   GLUCAP 178 (H) 11/18/2018     Exercise Target Goals: Exercise Program Goal: Individual exercise prescription set using results from initial 6 min walk test and THRR while considering  patient's activity barriers and safety.   Exercise Prescription Goal: Initial exercise prescription builds to 30-45 minutes a day of aerobic activity, 2-3 days per week.  Home exercise guidelines will be given to patient during program as part of exercise prescription that the participant will acknowledge.  Activity Barriers & Risk Stratification: Activity Barriers & Cardiac Risk Stratification - 01/15/19 1011      Activity Barriers & Cardiac Risk Stratification   Activity Barriers  Muscular Weakness;Deconditioning    Cardiac Risk Stratification  High       6  Minute Walk: 6 Minute Walk    Row Name 01/15/19 1010         6 Minute Walk   Phase  Initial     Distance  1378 feet     Walk Time  6 minutes     # of Rest Breaks  0     MPH  2.6     METS  3.25     RPE  11     Perceived Dyspnea   0     VO2 Peak  11.41     Symptoms  No     Resting HR  68 bpm     Resting BP  102/70     Resting Oxygen Saturation   99 %     Exercise Oxygen Saturation  during 6 min walk  98 %     Max Ex. HR  93 bpm     Max Ex. BP  112/70     2 Minute Post BP  102/68        Oxygen Initial Assessment:   Oxygen Re-Evaluation:   Oxygen Discharge (Final Oxygen Re-Evaluation):   Initial Exercise Prescription: Initial Exercise Prescription - 01/15/19 1000      Date of Initial Exercise RX and Referring Provider   Date  01/15/19    Referring Provider  Dr. Aundra Dubin    Expected Discharge Date  04/22/19      Treadmill   MPH  2.8    Grade  0    Minutes  10    METs  3.2      Bike   Level  0.5    Minutes  10    METs  2.35      NuStep   Level  2    SPM  75    Minutes  10    METs  3.2      Prescription Details   Frequency (times per week)  3    Duration  Progress to 30 minutes of continuous aerobic without signs/symptoms of physical distress      Intensity   THRR 40-80% of Max Heartrate  63-126    Ratings of Perceived Exertion  11-13      Progression   Progression  Continue to progress workloads to maintain intensity without signs/symptoms of physical distress.      Resistance Training   Training Prescription  Yes    Weight  3 lbs.     Reps  10-15       Perform Capillary Blood Glucose checks as needed.  Exercise Prescription  Changes:   Exercise Comments:   Exercise Goals and Review: Exercise Goals    Row Name 01/15/19 1018             Exercise Goals   Increase Physical Activity  Yes       Intervention  Provide advice, education, support and counseling about physical activity/exercise needs.;Develop an individualized exercise  prescription for aerobic and resistive training based on initial evaluation findings, risk stratification, comorbidities and participant's personal goals.       Expected Outcomes  Short Term: Attend rehab on a regular basis to increase amount of physical activity.       Increase Strength and Stamina  Yes       Intervention  Provide advice, education, support and counseling about physical activity/exercise needs.;Develop an individualized exercise prescription for aerobic and resistive training based on initial evaluation findings, risk stratification, comorbidities and participant's personal goals.       Expected Outcomes  Short Term: Increase workloads from initial exercise prescription for resistance, speed, and METs.       Able to understand and use rate of perceived exertion (RPE) scale  Yes       Intervention  Provide education and explanation on how to use RPE scale       Expected Outcomes  Short Term: Able to use RPE daily in rehab to express subjective intensity level;Long Term:  Able to use RPE to guide intensity level when exercising independently       Knowledge and understanding of Target Heart Rate Range (THRR)  Yes       Intervention  Provide education and explanation of THRR including how the numbers were predicted and where they are located for reference       Expected Outcomes  Short Term: Able to state/look up THRR;Long Term: Able to use THRR to govern intensity when exercising independently;Short Term: Able to use daily as guideline for intensity in rehab       Able to check pulse independently  Yes       Intervention  Provide education and demonstration on how to check pulse in carotid and radial arteries.;Review the importance of being able to check your own pulse for safety during independent exercise       Expected Outcomes  Short Term: Able to explain why pulse checking is important during independent exercise;Long Term: Able to check pulse independently and accurately        Understanding of Exercise Prescription  Yes       Intervention  Provide education, explanation, and written materials on patient's individual exercise prescription       Expected Outcomes  Short Term: Able to explain program exercise prescription;Long Term: Able to explain home exercise prescription to exercise independently          Exercise Goals Re-Evaluation :   Discharge Exercise Prescription (Final Exercise Prescription Changes):   Nutrition:  Target Goals: Understanding of nutrition guidelines, daily intake of sodium 1500mg , cholesterol 200mg , calories 30% from fat and 7% or less from saturated fats, daily to have 5 or more servings of fruits and vegetables.  Biometrics: Pre Biometrics - 01/15/19 1018      Pre Biometrics   Height  5' 5.5" (1.664 m)    Weight  72.7 kg    Waist Circumference  33 inches    Hip Circumference  38.5 inches    Waist to Hip Ratio  0.86 %    BMI (Calculated)  26.26    Triceps Skinfold  29 mm    % Body Fat  37.1 %    Grip Strength  23 kg    Flexibility  18 in    Single Leg Stand  30 seconds        Nutrition Therapy Plan and Nutrition Goals:   Nutrition Assessments:   Nutrition Goals Re-Evaluation:   Nutrition Goals Re-Evaluation:   Nutrition Goals Discharge (Final Nutrition Goals Re-Evaluation):   Psychosocial: Target Goals: Acknowledge presence or absence of significant depression and/or stress, maximize coping skills, provide positive support system. Participant is able to verbalize types and ability to use techniques and skills needed for reducing stress and depression.  Initial Review & Psychosocial Screening: Initial Psych Review & Screening - 01/15/19 1132      Initial Review   Current issues with  Current Stress Concerns    Source of Stress Concerns  Unable to perform yard/household activities;Chronic Illness    Comments  Kerria is on short term disability from her job at [employer]?  Yes   Amani has her family and friends for support     Barriers   Psychosocial barriers to participate in program  The patient should benefit from training in stress management and relaxation.      Screening Interventions   Interventions  Encouraged to exercise    Expected Outcomes  Long Term Goal: Stressors or current issues are controlled or eliminated.;Short Term goal: Identification and review with participant of any Quality of Life or Depression concerns found by scoring the questionnaire.;Short Term goal: Utilizing psychosocial counselor, staff and physician to assist with identification of specific Stressors or current issues interfering with healing process. Setting desired goal for each stressor or current issue identified.;Long Term goal: The participant improves quality of Life and PHQ9 Scores as seen by post scores and/or verbalization of changes       Quality of Life Scores: Quality of Life - 01/15/19 1019      Quality of Life   Select  Quality of Life      Quality of Life Scores   Health/Function Pre  15.27 %    Socioeconomic Pre  21.44 %    Psych/Spiritual Pre  20.29 %    Family Pre  27.1 %    GLOBAL Pre  19.37 %      Scores of 19 and below usually indicate a poorer quality of life in these areas.  A difference of  2-3 points is a clinically meaningful difference.  A difference of 2-3 points in the total score of the Quality of Life Index has been associated with significant improvement in overall quality of life, self-image, physical symptoms, and general health in studies assessing change in quality of life.  PHQ-9: Recent Review Flowsheet Data    There is no flowsheet data to display.     Interpretation of Total Score  Total Score Depression Severity:  1-4 = Minimal depression, 5-9 = Mild depression, 10-14 = Moderate depression, 15-19 = Moderately severe depression, 20-27 = Severe depression   Psychosocial Evaluation and  Intervention:   Psychosocial Re-Evaluation:   Psychosocial Discharge (Final Psychosocial Re-Evaluation):   Vocational Rehabilitation: Provide vocational rehab assistance to qualifying candidates.   Vocational Rehab Evaluation & Intervention: Vocational Rehab - 01/15/19 1144      Initial Vocational Rehab Evaluation & Intervention   Assessment shows need for Vocational Rehabilitation  Yes   Will give voacational rehab packet for Ms Custodio  to review      Education: Education Goals: Education classes will be provided on a weekly basis, covering required topics. Participant will state understanding/return demonstration of topics presented.  Learning Barriers/Preferences: Learning Barriers/Preferences - 01/15/19 1019      Learning Barriers/Preferences   Learning Barriers  Sight    Learning Preferences  Skilled Demonstration       Education Topics: Count Your Pulse:  -Group instruction provided by verbal instruction, demonstration, patient participation and written materials to support subject.  Instructors address importance of being able to find your pulse and how to count your pulse when at home without a heart monitor.  Patients get hands on experience counting their pulse with staff help and individually.   Heart Attack, Angina, and Risk Factor Modification:  -Group instruction provided by verbal instruction, video, and written materials to support subject.  Instructors address signs and symptoms of angina and heart attacks.    Also discuss risk factors for heart disease and how to make changes to improve heart health risk factors.   Functional Fitness:  -Group instruction provided by verbal instruction, demonstration, patient participation, and written materials to support subject.  Instructors address safety measures for doing things around the house.  Discuss how to get up and down off the floor, how to pick things up properly, how to safely get out of a chair without  assistance, and balance training.   Meditation and Mindfulness:  -Group instruction provided by verbal instruction, patient participation, and written materials to support subject.  Instructor addresses importance of mindfulness and meditation practice to help reduce stress and improve awareness.  Instructor also leads participants through a meditation exercise.    Stretching for Flexibility and Mobility:  -Group instruction provided by verbal instruction, patient participation, and written materials to support subject.  Instructors lead participants through series of stretches that are designed to increase flexibility thus improving mobility.  These stretches are additional exercise for major muscle groups that are typically performed during regular warm up and cool down.   Hands Only CPR:  -Group verbal, video, and participation provides a basic overview of AHA guidelines for community CPR. Role-play of emergencies allow participants the opportunity to practice calling for help and chest compression technique with discussion of AED use.   Hypertension: -Group verbal and written instruction that provides a basic overview of hypertension including the most recent diagnostic guidelines, risk factor reduction with self-care instructions and medication management.    Nutrition I class: Heart Healthy Eating:  -Group instruction provided by PowerPoint slides, verbal discussion, and written materials to support subject matter. The instructor gives an explanation and review of the Therapeutic Lifestyle Changes diet recommendations, which includes a discussion on lipid goals, dietary fat, sodium, fiber, plant stanol/sterol esters, sugar, and the components of a well-balanced, healthy diet.   Nutrition II class: Lifestyle Skills:  -Group instruction provided by PowerPoint slides, verbal discussion, and written materials to support subject matter. The instructor gives an explanation and review of  label reading, grocery shopping for heart health, heart healthy recipe modifications, and ways to make healthier choices when eating out.   Diabetes Question & Answer:  -Group instruction provided by PowerPoint slides, verbal discussion, and written materials to support subject matter. The instructor gives an explanation and review of diabetes co-morbidities, pre- and post-prandial blood glucose goals, pre-exercise blood glucose goals, signs, symptoms, and treatment of hypoglycemia and hyperglycemia, and foot care basics.   Diabetes Blitz:  -Group instruction provided by [employer], verbal discussion,  and written materials to support subject matter. The instructor gives an explanation and review of the physiology behind type 1 and type 2 diabetes, diabetes medications and rational behind using different medications, pre- and post-prandial blood glucose recommendations and Hemoglobin A1c goals, diabetes diet, and exercise including blood glucose guidelines for exercising safely.    Portion Distortion:  -Group instruction provided by PowerPoint slides, verbal discussion, written materials, and food models to support subject matter. The instructor gives an explanation of serving size versus portion size, changes in portions sizes over the last 20 years, and what consists of a serving from each food group.   Stress Management:  -Group instruction provided by verbal instruction, video, and written materials to support subject matter.  Instructors review role of stress in heart disease and how to cope with stress positively.     Exercising on Your Own:  -Group instruction provided by verbal instruction, power point, and written materials to support subject.  Instructors discuss benefits of exercise, components of exercise, frequency and intensity of exercise, and end points for exercise.  Also discuss use of nitroglycerin and activating EMS.  Review options of places to exercise outside of  rehab.  Review guidelines for sex with heart disease.   Cardiac Drugs I:  -Group instruction provided by verbal instruction and written materials to support subject.  Instructor reviews cardiac drug classes: antiplatelets, anticoagulants, beta blockers, and statins.  Instructor discusses reasons, side effects, and lifestyle considerations for each drug class.   Cardiac Drugs II:  -Group instruction provided by verbal instruction and written materials to support subject.  Instructor reviews cardiac drug classes: angiotensin converting enzyme inhibitors (ACE-I), angiotensin II receptor blockers (ARBs), nitrates, and calcium channel blockers.  Instructor discusses reasons, side effects, and lifestyle considerations for each drug class.   Anatomy and Physiology of the Circulatory System:  Group verbal and written instruction and models provide basic cardiac anatomy and physiology, with the coronary electrical and arterial systems. Review of: AMI, Angina, Valve disease, Heart Failure, Peripheral Artery Disease, Cardiac Arrhythmia, Pacemakers, and the ICD.   Other Education:  -Group or individual verbal, written, or video instructions that support the educational goals of the cardiac rehab program.   Holiday Eating Survival Tips:  -Group instruction provided by PowerPoint slides, verbal discussion, and written materials to support subject matter. The instructor gives patients tips, tricks, and techniques to help them not only survive but enjoy the holidays despite the onslaught of food that accompanies the holidays.   Knowledge Questionnaire Score: Knowledge Questionnaire Score - 01/15/19 1019      Knowledge Questionnaire Score   Pre Score  22/24       Core Components/Risk Factors/Patient Goals at Admission: Personal Goals and Risk Factors at Admission - 01/15/19 1020      Core Components/Risk Factors/Patient Goals on Admission    Weight Management  Yes;Weight Maintenance;Weight Loss     Intervention  Weight Management: Develop a combined nutrition and exercise program designed to reach desired caloric intake, while maintaining appropriate intake of nutrient and fiber, sodium and fats, and appropriate energy expenditure required for the weight goal.;Weight Management: Provide education and appropriate resources to help participant work on and attain dietary goals.    Admit Weight  160 lb 4.4 oz (72.7 kg)    Expected Outcomes  Short Term: Continue to assess and modify interventions until short term weight is achieved;Long Term: Adherence to nutrition and physical activity/exercise program aimed toward attainment of established weight goal;Weight Maintenance: Understanding of the daily  nutrition guidelines, which includes 25-35% calories from fat, 7% or less cal from saturated fats, less than 200mg  cholesterol, less than 1.5gm of sodium, & 5 or more servings of fruits and vegetables daily;Weight Loss: Understanding of general recommendations for a balanced deficit meal plan, which promotes 1-2 lb weight loss per week and includes a negative energy balance of [id number] kcal/d;Understanding recommendations for meals to include 15-35% energy as protein, 25-35% energy from fat, 35-60% energy from carbohydrates, less than 200mg  of dietary cholesterol, 20-35 gm of total fiber daily;Understanding of distribution of calorie intake throughout the day with the consumption of 4-5 meals/snacks    Diabetes  Yes    Intervention  Provide education about signs/symptoms and action to take for hypo/hyperglycemia.;Provide education about proper nutrition, including hydration, and aerobic/resistive exercise prescription along with prescribed medications to achieve blood glucose in normal ranges: Fasting glucose 65-99 mg/dL    Expected Outcomes  Short Term: Participant verbalizes understanding of the signs/symptoms and immediate care of hyper/hypoglycemia, proper foot care and importance of medication,  aerobic/resistive exercise and nutrition plan for blood glucose control.;Long Term: Attainment of HbA1C < 7%.    Heart Failure  Yes    Intervention  Provide a combined exercise and nutrition program that is supplemented with education, support and counseling about heart failure. Directed toward relieving symptoms such as shortness of breath, decreased exercise tolerance, and extremity edema.    Expected Outcomes  Improve functional capacity of life;Short term: Attendance in program 2-3 days a week with increased exercise capacity. Reported lower sodium intake. Reported increased fruit and vegetable intake. Reports medication compliance.;Short term: Daily weights obtained and reported for increase. Utilizing diuretic protocols set by physician.;Long term: Adoption of self-care skills and reduction of barriers for early signs and symptoms recognition and intervention leading to self-care maintenance.    Hypertension  Yes    Intervention  Provide education on lifestyle modifcations including regular physical activity/exercise, weight management, moderate sodium restriction and increased consumption of fresh fruit, vegetables, and low fat dairy, alcohol moderation, and smoking cessation.;Monitor prescription use compliance.    Expected Outcomes  Short Term: Continued assessment and intervention until BP is < 140/26mm HG in hypertensive participants. < 130/34mm HG in hypertensive participants with diabetes, heart failure or chronic kidney disease.;Long Term: Maintenance of blood pressure at goal levels.    Lipids  Yes    Intervention  Provide education and support for participant on nutrition & aerobic/resistive exercise along with prescribed medications to achieve LDL 70mg , HDL >40mg .    Expected Outcomes  Short Term: Participant states understanding of desired cholesterol values and is compliant with medications prescribed. Participant is following exercise prescription and nutrition guidelines.;Long Term:  Cholesterol controlled with medications as prescribed, with individualized exercise RX and with personalized nutrition plan. Value goals: LDL < 70mg , HDL > 40 mg.    Stress  Yes    Intervention  Offer individual and/or small group education and counseling on adjustment to heart disease, stress management and health-related lifestyle change. Teach and support self-help strategies.;Refer participants experiencing significant psychosocial distress to appropriate mental health specialists for further evaluation and treatment. When possible, include family members and significant others in education/counseling sessions.    Expected Outcomes  Short Term: Participant demonstrates changes in health-related behavior, relaxation and other stress management skills, ability to obtain effective social support, and compliance with psychotropic medications if prescribed.;Long Term: Emotional wellbeing is indicated by absence of clinically significant psychosocial distress or social isolation.       Core Components/Risk  Factors/Patient Goals Review:    Core Components/Risk Factors/Patient Goals at Discharge (Final Review):    ITP Comments: ITP Comments    Row Name 01/15/19 1131           ITP Comments  Dr Fransico Him, MD, Medical Director          Comments: Anvi attended orientation from [phone] to [phone] to review rules and guidelines for program. Completed 6 minute walk test, Intitial ITP, and exercise prescription.  VSS. Telemetry-Sinus with PVC's.  Asymptomatic.Barnet Pall, RN,BSN 01/15/2019 12:10 PM

## 2019-01-16 NOTE — Progress Notes (Signed)
KIMBREE CASANAS 63 y.o. female DOB: [birth year] MRN: [id number]      Nutrition Note  1. Chronic systolic CHF (congestive heart failure) ([name])    Past Medical History:  Diagnosis Date  . CHF (congestive heart failure) ([place])   . Diabetes mellitus without complication ([place])   . Hypertension    Meds reviewed.   Current Outpatient Medications (Endocrine & Metabolic):  .  dapagliflozin propanediol (FARXIGA) 5 MG TABS tablet, Take 5 mg by mouth daily. Marland Kitchen  glipiZIDE (GLUCOTROL XL) 10 MG 24 hr tablet, Take 10 mg by mouth 2 (two) times daily with a meal.  .  Liraglutide (VICTOZA [place]), Inject 1.8 mg into the skin every evening.  .  metFORMIN (GLUCOPHAGE) 1000 MG tablet, Take 1,000 mg by mouth 2 (two) times daily with a meal.  Current Outpatient Medications (Cardiovascular):  .  carvedilol (COREG) 25 MG tablet, Take 1 tablet (25 mg total) by mouth 2 (two) times daily with a meal. .  digoxin (LANOXIN) 0.125 MG tablet, Take 1 tablet (0.125 mg total) by mouth daily. .  furosemide (LASIX) 20 MG tablet, Take 2 tablets (40 mg total) by mouth daily. .  simvastatin (ZOCOR) 20 MG tablet, Take 20 mg by mouth every evening. Marland Kitchen  spironolactone (ALDACTONE) 25 MG tablet, Take 1 tablet (25 mg total) by mouth every evening. .  valsartan (DIOVAN) 40 MG tablet, Take 1 tablet (40 mg total) by mouth 2 (two) times daily.   Current Outpatient Medications (Analgesics):  .  aspirin 81 MG chewable tablet, Chew 1 tablet (81 mg total) by mouth daily.   Current Outpatient Medications (Other):  Marland Kitchen  Multiple Vitamins-Minerals (CENTRUM SILVER PO), Take 1 tablet by mouth daily. Reported on 02/10/2016 .  TRUE METRIX BLOOD GLUCOSE TEST test strip, Use as directed. .  TRUEPLUS LANCETS 30G MISC, Use as directed.  HT: Ht Readings from Last 1 Encounters:  01/15/19 5' 5.5" (1.664 m)    WT: Wt Readings from Last 5 Encounters:  01/15/19 160 lb 4.4 oz (72.7 kg)  12/19/18 161 lb 3.2 oz (73.1 kg)  11/28/18 160 lb 6.4 oz (72.8 kg)   11/19/18 165 lb 8 oz (75.1 kg)     Body mass index is 26.27 kg/m.   Current tobacco use? No  Labs:  Lipid Panel     Component Value Date/Time   CHOL 182 11/15/2018 0026   TRIG 134 11/15/2018 0026   HDL 38 (L) 11/15/2018 0026   CHOLHDL 4.8 11/15/2018 0026   VLDL 27 11/15/2018 0026   LDLCALC 117 (H) 11/15/2018 0026    Lab Results  Component Value Date   HGBA1C 6.9 (H) 11/14/2018   CBG (last 3)  No results for input(s): GLUCAP in the last 72 hours.  Nutrition Note Spoke with pt. Nutrition plan and goals reviewed with pt. Pt is following Step 1 of the Therapeutic Lifestyle Changes diet. Pt wants to lose wt. Pt has not been trying to lose wt. Wt loss tips reviewed (label reading, how to build a healthy plate, portion sizes, eating frequently across the day). Pt has Type 2 Diabetes. Last A1c indicates blood glucose well-controlled. This Probation officer went over Diabetes Education test results. Pt checks CBG's 4x times a week. Fasting CBG's reportedly 120-180's mg/dL. Reviewed the benefits of regularly checking blood sugars with patient, recommended she check blood sugars daily. Pt with dx of CHF. Per discussion, pt does not use canned/convenience foods often. Pt does not add salt to food. Pt does not eat out frequently.  Pt expressed understanding of the information reviewed. Pt aware of nutrition education classes offered and would like to attend nutrition classes.  Nutrition Diagnosis ? Food-and nutrition-related knowledge deficit related to lack of exposure to information as related to diagnosis of: ? CVD ? Type 2 Diabetes ? Overweight  related to excessive energy intake as evidenced by a Body mass index is 26.27 kg/m.  Nutrition Intervention ? Pt's individual nutrition plan and goals reviewed with pt. ? Pt given handouts for: ? Nutrition I class ? Nutrition II class  ? Diabetes Blitz Class ? Diabetes Q & A class   Nutrition Goal(s):  ? Pt to identify and limit food sources of saturated  fat, trans fat, refined carbohydrates and sodium ? Pt to identify food quantities necessary to achieve weight loss of 6-24 lb at graduation from cardiac rehab.  ? Pt to build a healthy plate including vegetables, fruits, whole grains, and low-fat dairy products in a heart healthy meal plan. ? Pt to check blood sugars daily  Plan:  ? Pt to attend nutrition classes:  ? Nutrition I ? Nutrition II ? Portion Distortion  ? Diabetes Blitz Class ? Diabetes Q & A class  ? Will provide client-centered nutrition education as part of interdisciplinary care ? Monitor and evaluate progress toward nutrition goal with team.  Laurina Bustle, MS, RD, LDN 01/16/2019 8:40 AM

## 2019-01-21 ENCOUNTER — Encounter (HOSPITAL_COMMUNITY)
Admission: RE | Admit: 2019-01-21 | Discharge: 2019-01-21 | Disposition: A | Discharge status: Home / Self Care | Payer: No Typology Code available for payment source | Source: Ambulatory Visit | Attending: Cardiology | Admitting: Cardiology

## 2019-01-21 ENCOUNTER — Ambulatory Visit (HOSPITAL_COMMUNITY): Payer: No Typology Code available for payment source

## 2019-01-21 DIAGNOSIS — I5022 Chronic systolic (congestive) heart failure: Secondary | ICD-10-CM | POA: Insufficient documentation

## 2019-01-21 NOTE — Progress Notes (Signed)
Daily Session Note  Patient Details  Name: Toni [name] MRN: [id number] Date of Birth: 12/19/1955 Referring Provider:     CARDIAC REHAB PHASE II ORIENTATION from 01/15/2019 in [place]  Referring Provider  Dr. Aundra Dubin      Encounter Date: 01/21/2019  Check In: Session Check In - 01/21/19 1038      Check-In   Supervising physician immediately available to respond to emergencies  [facility] immediately available    Physician(s)  Dr. Lunette Stands    Location  [facility]    Staff Present  Jiles Garter, RN, BSN;Joann Rion, RN, Deland Pretty, MS, ACSM CEP, Exercise Physiologist;Dalton Kris Mouton, MS, Exercise Physiologist    Medication changes reported      No    Fall or balance concerns reported     No    Tobacco Cessation  No Change    Warm-up and Cool-down  Performed as group-led instruction    Resistance Training Performed  No    VAD Patient?  No    PAD/SET Patient?  No      Pain Assessment   Currently in Pain?  No/denies       Capillary Blood Glucose: No results found for this or any previous visit (from the past 24 hour(s)).  Exercise Prescription Changes - 01/21/19 0958      Response to Exercise   Blood Pressure (Admit)  110/76    Blood Pressure (Exercise)  128/74    Blood Pressure (Exit)  106/62    Heart Rate (Admit)  91 bpm    Heart Rate (Exercise)  127 bpm    Heart Rate (Exit)  89 bpm    Rating of Perceived Exertion (Exercise)  12    Symptoms  Pt c/o feeling "swimmy headed" on bike. Resolvedwith rest.    Comments  Will switch from [employer] bike to [employer] bike for comfort.    Duration  Progress to 30 minutes of  aerobic without signs/symptoms of physical distress    Intensity  THRR unchanged      Progression   Progression  Continue to progress workloads to maintain intensity without signs/symptoms of physical distress.    Average METs  2.3      Resistance Training   Training Prescription  No    Relaxation day, no weights.     Interval Training   Interval Training  No      Treadmill   MPH  2.8    Grade  0    Minutes  10    METs  3.14      Bike   Level  0.5    Minutes  10    METs  2.29      NuStep   Level  2    SPM  75    Minutes  10    METs  1.5       Social History   Tobacco Use  Smoking Status Never Smoker  Smokeless Tobacco Never Used    Goals Met:  Exercise tolerated well  Goals Unmet:  Not Applicable  Comments: Pt started cardiac rehab today.  Pt tolerated light exercise without difficulty. VSS, telemetry-SR with PVCs, asymptomatic.  Medication list reconciled. Pt denies barriers to medicaiton compliance.  PSYCHOSOCIAL ASSESSMENT:  PHQ-0. Pt exhibits positive coping skills, hopeful outlook with supportive family. No psychosocial needs identified at this time, no psychosocial interventions necessary.  Pt oriented to exercise equipment and routine.    Understanding verbalized.  Dr. Traci Turner is Medical Director for Cardiac Rehab at [facility]. 

## 2019-01-22 ENCOUNTER — Other Ambulatory Visit (HOSPITAL_COMMUNITY): Payer: Self-pay | Admitting: Cardiology

## 2019-01-22 MED FILL — VALSARTAN 40 MG TABLET: 40 | 30 days supply | Qty: 60 | Fill #0

## 2019-01-22 MED FILL — SPIRONOLACTONE 25 MG TABLET: 25 | 30 days supply | Qty: 30 | Fill #0 | Status: TO

## 2019-01-22 NOTE — Progress Notes (Signed)
Cardiac Individual Treatment Plan  Patient Details  Name: Toni [name] MRN: [id number] Date of Birth: 09-28-1956 Referring Provider:     CARDIAC REHAB PHASE II ORIENTATION from 01/15/2019 in [place]  Referring Provider  Dr. Aundra Dubin      Initial Encounter Date:    CARDIAC REHAB PHASE II ORIENTATION from 01/15/2019 in [place]  Date  01/15/19      Visit Diagnosis: Chronic systolic CHF (congestive heart failure) ([place])  Patient's Home Medications on Admission:  Current Outpatient Medications:  .  aspirin 81 MG chewable tablet, Chew 1 tablet (81 mg total) by mouth daily., Disp: 30 tablet, Rfl: 0 .  carvedilol (COREG) 25 MG tablet, Take 1 tablet (25 mg total) by mouth 2 (two) times daily with a meal., Disp: 30 tablet, Rfl: 0 .  dapagliflozin propanediol (FARXIGA) 5 MG TABS tablet, Take 5 mg by mouth daily., Disp: , Rfl:  .  digoxin (LANOXIN) 0.125 MG tablet, Take 1 tablet (0.125 mg total) by mouth daily., Disp: 90 tablet, Rfl: 3 .  furosemide (LASIX) 20 MG tablet, Take 2 tablets (40 mg total) by mouth daily., Disp: 30 tablet, Rfl: 0 .  glipiZIDE (GLUCOTROL XL) 10 MG 24 hr tablet, Take 10 mg by mouth 2 (two) times daily with a meal. , Disp: , Rfl:  .  Liraglutide (VICTOZA [place]), Inject 1.8 mg into the skin every evening. , Disp: , Rfl:  .  metFORMIN (GLUCOPHAGE) 1000 MG tablet, Take 1,000 mg by mouth 2 (two) times daily with a meal., Disp: , Rfl:  .  Multiple Vitamins-Minerals (CENTRUM SILVER PO), Take 1 tablet by mouth daily. Reported on 02/10/2016, Disp: , Rfl:  .  simvastatin (ZOCOR) 20 MG tablet, Take 20 mg by mouth every evening., Disp: , Rfl:  .  TRUE METRIX BLOOD GLUCOSE TEST test strip, Use as directed., Disp: , Rfl: 5 .  TRUEPLUS LANCETS 30G MISC, Use as directed., Disp: , Rfl: 5 .  valsartan (DIOVAN) 40 MG tablet, Take 1 tablet (40 mg total) by mouth 2 (two) times daily., Disp: 60 tablet, Rfl: 6 .  spironolactone  (ALDACTONE) 25 MG tablet, Take 1 tablet (25 mg total) by mouth every evening., Disp: 30 tablet, Rfl: 6  Past Medical History: Past Medical History:  Diagnosis Date  . CHF (congestive heart failure) ([place])   . Diabetes mellitus without complication ([place])   . Hypertension     Tobacco Use: Social History   Tobacco Use  Smoking Status Never Smoker  Smokeless Tobacco Never Used    Labs: Recent Review Flowsheet Data    Labs for ITP Cardiac and Pulmonary Rehab Latest Ref Rng & Units 10/26/2016 11/14/2018 11/15/2018 11/17/2018 11/17/2018   Cholestrol 0 - 200 mg/dL - - 182 - -   LDLCALC 0 - 99 mg/dL - - 117(H) - -   HDL >40 mg/dL - - 38(L) - -   Trlycerides <150 mg/dL - - 134 - -   Hemoglobin A1c 4.8 - 5.6 % 7.6 6.9(H) - - -   PHART 7.350 - 7.450 - - - - 7.307(L)   PCO2ART 32.0 - 48.0 mmHg - - - - 43.9   HCO3 20.0 - 28.0 mmol/L - - - 25.5 22.0   TCO2 22 - 32 mmol/L - - - 27 23   ACIDBASEDEF 0.0 - 2.0 mmol/L - - - 1.0 4.0(H)   O2SAT % - - - 66.0 93.0      Capillary Blood  Glucose: Lab Results  Component Value Date   GLUCAP 141 (H) 11/19/2018   GLUCAP 188 (H) 11/18/2018   GLUCAP 154 (H) 11/18/2018   GLUCAP 236 (H) 11/18/2018   GLUCAP 178 (H) 11/18/2018     Exercise Target Goals: Exercise Program Goal: Individual exercise prescription set using results from initial 6 min walk test and THRR while considering  patient's activity barriers and safety.   Exercise Prescription Goal: Initial exercise prescription builds to 30-45 minutes a day of aerobic activity, 2-3 days per week.  Home exercise guidelines will be given to patient during program as part of exercise prescription that the participant will acknowledge.  Activity Barriers & Risk Stratification: Activity Barriers & Cardiac Risk Stratification - 01/15/19 1011      Activity Barriers & Cardiac Risk Stratification   Activity Barriers  Muscular Weakness;Deconditioning    Cardiac Risk Stratification  High       6 Minute  Walk: 6 Minute Walk    Row Name 01/15/19 1010         6 Minute Walk   Phase  Initial     Distance  1378 feet     Walk Time  6 minutes     # of Rest Breaks  0     MPH  2.6     METS  3.25     RPE  11     Perceived Dyspnea   0     VO2 Peak  11.41     Symptoms  No     Resting HR  68 bpm     Resting BP  102/70     Resting Oxygen Saturation   99 %     Exercise Oxygen Saturation  during 6 min walk  98 %     Max Ex. HR  93 bpm     Max Ex. BP  112/70     2 Minute Post BP  102/68        Oxygen Initial Assessment:   Oxygen Re-Evaluation:   Oxygen Discharge (Final Oxygen Re-Evaluation):   Initial Exercise Prescription: Initial Exercise Prescription - 01/15/19 1000      Date of Initial Exercise RX and Referring Provider   Date  01/15/19    Referring Provider  Dr. Aundra Dubin    Expected Discharge Date  04/22/19      Treadmill   MPH  2.8    Grade  0    Minutes  10    METs  3.2      Bike   Level  0.5    Minutes  10    METs  2.35      NuStep   Level  2    SPM  75    Minutes  10    METs  3.2      Prescription Details   Frequency (times per week)  3    Duration  Progress to 30 minutes of continuous aerobic without signs/symptoms of physical distress      Intensity   THRR 40-80% of Max Heartrate  63-126    Ratings of Perceived Exertion  11-13      Progression   Progression  Continue to progress workloads to maintain intensity without signs/symptoms of physical distress.      Resistance Training   Training Prescription  Yes    Weight  3 lbs.     Reps  10-15       Perform Capillary Blood Glucose checks as needed.  Exercise Prescription  Changes: Exercise Prescription Changes    Row Name 01/21/19 [phone]             Response to Exercise   Blood Pressure (Admit)  110/76       Blood Pressure (Exercise)  128/74       Blood Pressure (Exit)  106/62       Heart Rate (Admit)  91 bpm       Heart Rate (Exercise)  127 bpm       Heart Rate (Exit)  89 bpm        Rating of Perceived Exertion (Exercise)  12       Symptoms  Pt c/o feeling "swimmy headed" on bike. Resolvedwith rest.       Comments  Will switch from [employer] bike to [employer] bike for comfort.       Duration  Progress to 30 minutes of  aerobic without signs/symptoms of physical distress       Intensity  THRR unchanged         Progression   Progression  Continue to progress workloads to maintain intensity without signs/symptoms of physical distress.       Average METs  2.3         Resistance Training   Training Prescription  No Relaxation day, no weights.         Interval Training   Interval Training  No         Treadmill   MPH  2.8       Grade  0       Minutes  10       METs  3.14         Bike   Level  0.5       Minutes  10       METs  2.29         NuStep   Level  2       SPM  75       Minutes  10       METs  1.5          Exercise Comments: Exercise Comments    Row Name 01/21/19 1051           Exercise Comments  Patient tolerated first exercise session fairly well. Patient found the [employer] bike seat uncomfortable and c/o feeling "swimmy headed" on the bike. Will try [employer] bike next session. Symptoms resolved.          Exercise Goals and Review: Exercise Goals    Row Name 01/15/19 1018             Exercise Goals   Increase Physical Activity  Yes       Intervention  Provide advice, education, support and counseling about physical activity/exercise needs.;Develop an individualized exercise prescription for aerobic and resistive training based on initial evaluation findings, risk stratification, comorbidities and participant's personal goals.       Expected Outcomes  Short Term: Attend rehab on a regular basis to increase amount of physical activity.       Increase Strength and Stamina  Yes       Intervention  Provide advice, education, support and counseling about physical activity/exercise needs.;Develop an individualized exercise prescription for aerobic  and resistive training based on initial evaluation findings, risk stratification, comorbidities and participant's personal goals.       Expected Outcomes  Short Term: Increase workloads from initial exercise prescription for resistance, speed, and METs.  Able to understand and use rate of perceived exertion (RPE) scale  Yes       Intervention  Provide education and explanation on how to use RPE scale       Expected Outcomes  Short Term: Able to use RPE daily in rehab to express subjective intensity level;Long Term:  Able to use RPE to guide intensity level when exercising independently       Knowledge and understanding of Target Heart Rate Range (THRR)  Yes       Intervention  Provide education and explanation of THRR including how the numbers were predicted and where they are located for reference       Expected Outcomes  Short Term: Able to state/look up THRR;Long Term: Able to use THRR to govern intensity when exercising independently;Short Term: Able to use daily as guideline for intensity in rehab       Able to check pulse independently  Yes       Intervention  Provide education and demonstration on how to check pulse in carotid and radial arteries.;Review the importance of being able to check your own pulse for safety during independent exercise       Expected Outcomes  Short Term: Able to explain why pulse checking is important during independent exercise;Long Term: Able to check pulse independently and accurately       Understanding of Exercise Prescription  Yes       Intervention  Provide education, explanation, and written materials on patient's individual exercise prescription       Expected Outcomes  Short Term: Able to explain program exercise prescription;Long Term: Able to explain home exercise prescription to exercise independently          Exercise Goals Re-Evaluation : Exercise Goals Re-Evaluation    Row Name 01/21/19 1051             Exercise Goal Re-Evaluation    Exercise Goals Review  Increase Physical Activity;Able to understand and use rate of perceived exertion (RPE) scale       Comments  Patient able to understand and use RPE scale appropriately.        Expected Outcomes  Increase workloads as tolerated to help achieve personal health and fitness goals.          Discharge Exercise Prescription (Final Exercise Prescription Changes): Exercise Prescription Changes - 01/21/19 0958      Response to Exercise   Blood Pressure (Admit)  110/76    Blood Pressure (Exercise)  128/74    Blood Pressure (Exit)  106/62    Heart Rate (Admit)  91 bpm    Heart Rate (Exercise)  127 bpm    Heart Rate (Exit)  89 bpm    Rating of Perceived Exertion (Exercise)  12    Symptoms  Pt c/o feeling "swimmy headed" on bike. Resolvedwith rest.    Comments  Will switch from [employer] bike to [employer] bike for comfort.    Duration  Progress to 30 minutes of  aerobic without signs/symptoms of physical distress    Intensity  THRR unchanged      Progression   Progression  Continue to progress workloads to maintain intensity without signs/symptoms of physical distress.    Average METs  2.3      Resistance Training   Training Prescription  No   Relaxation day, no weights.     Interval Training   Interval Training  No      Treadmill   MPH  2.8  Grade  0    Minutes  10    METs  3.14      Bike   Level  0.5    Minutes  10    METs  2.29      NuStep   Level  2    SPM  75    Minutes  10    METs  1.5       Nutrition:  Target Goals: Understanding of nutrition guidelines, daily intake of sodium 1500mg , cholesterol 200mg , calories 30% from fat and 7% or less from saturated fats, daily to have 5 or more servings of fruits and vegetables.  Biometrics: Pre Biometrics - 01/15/19 1018      Pre Biometrics   Height  5' 5.5" (1.664 m)    Weight  72.7 kg    Waist Circumference  33 inches    Hip Circumference  38.5 inches    Waist to Hip Ratio  0.86 %    BMI  (Calculated)  26.26    Triceps Skinfold  29 mm    % Body Fat  37.1 %    Grip Strength  23 kg    Flexibility  18 in    Single Leg Stand  30 seconds        Nutrition Therapy Plan and Nutrition Goals: Nutrition Therapy & Goals - 01/16/19 0844      Nutrition Therapy   Diet  heart healthy, carb modified      Personal Nutrition Goals   Nutrition Goal  Pt to identify and limit food sources of saturated fat, trans fat, refined carbohydrates and sodium    Personal Goal #2  Pt to identify food quantities necessary to achieve weight loss of 6-24 lb at graduation from cardiac rehab.    Personal Goal #3  Pt to build a healthy plate including vegetables, fruits, whole grains, and low-fat dairy products in a heart healthy meal plan.    Personal Goal #4  Pt to check blood sugars daily      Intervention Plan   Intervention  Prescribe, educate and counsel regarding individualized specific dietary modifications aiming towards targeted core components such as weight, hypertension, lipid management, diabetes, heart failure and other comorbidities.    Expected Outcomes  Short Term Goal: Understand basic principles of dietary content, such as calories, fat, sodium, cholesterol and nutrients.;Long Term Goal: Adherence to prescribed nutrition plan.       Nutrition Assessments: Nutrition Assessments - 01/16/19 0845      MEDFICTS Scores   Pre Score  50       Nutrition Goals Re-Evaluation: Nutrition Goals Re-Evaluation    [place] Name 01/16/19 0844             Goals   Current Weight  160 lb 4.4 oz (72.7 kg)          Nutrition Goals Re-Evaluation: Nutrition Goals Re-Evaluation    [place] Name 01/16/19 0844             Goals   Current Weight  160 lb 4.4 oz (72.7 kg)          Nutrition Goals Discharge (Final Nutrition Goals Re-Evaluation): Nutrition Goals Re-Evaluation - 01/16/19 0844      Goals   Current Weight  160 lb 4.4 oz (72.7 kg)       Psychosocial: Target Goals: Acknowledge  presence or absence of significant depression and/or stress, maximize coping skills, provide positive support system. Participant is able to verbalize types and ability to  use techniques and skills needed for reducing stress and depression.  Initial Review & Psychosocial Screening: Initial Psych Review & Screening - 01/15/19 1132      Initial Review   Current issues with  Current Stress Concerns    Source of Stress Concerns  Unable to perform yard/household activities;Chronic Illness    Comments  Annison is on short term disability from her job at [employer]?  Yes   Ayodele has her family and friends for support     Barriers   Psychosocial barriers to participate in program  The patient should benefit from training in stress management and relaxation.      Screening Interventions   Interventions  Encouraged to exercise    Expected Outcomes  Long Term Goal: Stressors or current issues are controlled or eliminated.;Short Term goal: Identification and review with participant of any Quality of Life or Depression concerns found by scoring the questionnaire.;Short Term goal: Utilizing psychosocial counselor, staff and physician to assist with identification of specific Stressors or current issues interfering with healing process. Setting desired goal for each stressor or current issue identified.;Long Term goal: The participant improves quality of Life and PHQ9 Scores as seen by post scores and/or verbalization of changes       Quality of Life Scores: Quality of Life - 01/15/19 1019      Quality of Life   Select  Quality of Life      Quality of Life Scores   Health/Function Pre  15.27 %    Socioeconomic Pre  21.44 %    Psych/Spiritual Pre  20.29 %    Family Pre  27.1 %    GLOBAL Pre  19.37 %      Scores of 19 and below usually indicate a poorer quality of life in these areas.  A difference of  2-3 points is a clinically meaningful difference.   A difference of 2-3 points in the total score of the Quality of Life Index has been associated with significant improvement in overall quality of life, self-image, physical symptoms, and general health in studies assessing change in quality of life.  PHQ-9: Recent Review Flowsheet Data    Depression screen [facility] 2/9 01/21/2019   Decreased Interest 0   Down, Depressed, Hopeless 0   PHQ - 2 Score 0     Interpretation of Total Score  Total Score Depression Severity:  1-4 = Minimal depression, 5-9 = Mild depression, 10-14 = Moderate depression, 15-19 = Moderately severe depression, 20-27 = Severe depression   Psychosocial Evaluation and Intervention: Psychosocial Evaluation - 01/21/19 1039      Psychosocial Evaluation & Interventions   Interventions  Encouraged to exercise with the program and follow exercise prescription;Stress management education;Relaxation education    Comments  Pt is currently on short term disability from her job.  This is stressful to her. Maame enjoys going to [employer].    Expected Outcomes  Shelby will report ability to manage her stress and maintain a positive outlook.     Continue Psychosocial Services   Follow up required by staff       Psychosocial Re-Evaluation:   Psychosocial Discharge (Final Psychosocial Re-Evaluation):   Vocational Rehabilitation: Provide vocational rehab assistance to qualifying candidates.   Vocational Rehab Evaluation & Intervention: Vocational Rehab - 01/15/19 1144      Initial Vocational Rehab Evaluation & Intervention   Assessment shows need for Vocational Rehabilitation  Yes  Will give voacational rehab packet for Ms Masterson to review      Education: Education Goals: Education classes will be provided on a weekly basis, covering required topics. Participant will state understanding/return demonstration of topics presented.  Learning Barriers/Preferences: Learning Barriers/Preferences - 01/15/19 1019       Learning Barriers/Preferences   Learning Barriers  Sight    Learning Preferences  Skilled Demonstration       Education Topics: Count Your Pulse:  -Group instruction provided by verbal instruction, demonstration, patient participation and written materials to support subject.  Instructors address importance of being able to find your pulse and how to count your pulse when at home without a heart monitor.  Patients get hands on experience counting their pulse with staff help and individually.   Heart Attack, Angina, and Risk Factor Modification:  -Group instruction provided by verbal instruction, video, and written materials to support subject.  Instructors address signs and symptoms of angina and heart attacks.    Also discuss risk factors for heart disease and how to make changes to improve heart health risk factors.   Functional Fitness:  -Group instruction provided by verbal instruction, demonstration, patient participation, and written materials to support subject.  Instructors address safety measures for doing things around the house.  Discuss how to get up and down off the floor, how to pick things up properly, how to safely get out of a chair without assistance, and balance training.   Meditation and Mindfulness:  -Group instruction provided by verbal instruction, patient participation, and written materials to support subject.  Instructor addresses importance of mindfulness and meditation practice to help reduce stress and improve awareness.  Instructor also leads participants through a meditation exercise.    CARDIAC REHAB PHASE II EXERCISE from 01/21/2019 in [place]  Date  01/21/19  Instruction Review Code  2- Demonstrated Understanding      Stretching for Flexibility and Mobility:  -Group instruction provided by verbal instruction, patient participation, and written materials to support subject.  Instructors lead participants through series of  stretches that are designed to increase flexibility thus improving mobility.  These stretches are additional exercise for major muscle groups that are typically performed during regular warm up and cool down.   Hands Only CPR:  -Group verbal, video, and participation provides a basic overview of AHA guidelines for community CPR. Role-play of emergencies allow participants the opportunity to practice calling for help and chest compression technique with discussion of AED use.   Hypertension: -Group verbal and written instruction that provides a basic overview of hypertension including the most recent diagnostic guidelines, risk factor reduction with self-care instructions and medication management.    Nutrition I class: Heart Healthy Eating:  -Group instruction provided by PowerPoint slides, verbal discussion, and written materials to support subject matter. The instructor gives an explanation and review of the Therapeutic Lifestyle Changes diet recommendations, which includes a discussion on lipid goals, dietary fat, sodium, fiber, plant stanol/sterol esters, sugar, and the components of a well-balanced, healthy diet.   Nutrition II class: Lifestyle Skills:  -Group instruction provided by PowerPoint slides, verbal discussion, and written materials to support subject matter. The instructor gives an explanation and review of label reading, grocery shopping for heart health, heart healthy recipe modifications, and ways to make healthier choices when eating out.   Diabetes Question & Answer:  -Group instruction provided by PowerPoint slides, verbal discussion, and written materials to support subject matter. The instructor gives an explanation and  review of diabetes co-morbidities, pre- and post-prandial blood glucose goals, pre-exercise blood glucose goals, signs, symptoms, and treatment of hypoglycemia and hyperglycemia, and foot care basics.   Diabetes Blitz:  -Group instruction provided by  PowerPoint slides, verbal discussion, and written materials to support subject matter. The instructor gives an explanation and review of the physiology behind type 1 and type 2 diabetes, diabetes medications and rational behind using different medications, pre- and post-prandial blood glucose recommendations and Hemoglobin A1c goals, diabetes diet, and exercise including blood glucose guidelines for exercising safely.    Portion Distortion:  -Group instruction provided by PowerPoint slides, verbal discussion, written materials, and food models to support subject matter. The instructor gives an explanation of serving size versus portion size, changes in portions sizes over the last 20 years, and what consists of a serving from each food group.   Stress Management:  -Group instruction provided by verbal instruction, video, and written materials to support subject matter.  Instructors review role of stress in heart disease and how to cope with stress positively.     Exercising on Your Own:  -Group instruction provided by verbal instruction, power point, and written materials to support subject.  Instructors discuss benefits of exercise, components of exercise, frequency and intensity of exercise, and end points for exercise.  Also discuss use of nitroglycerin and activating EMS.  Review options of places to exercise outside of rehab.  Review guidelines for sex with heart disease.   Cardiac Drugs I:  -Group instruction provided by verbal instruction and written materials to support subject.  Instructor reviews cardiac drug classes: antiplatelets, anticoagulants, beta blockers, and statins.  Instructor discusses reasons, side effects, and lifestyle considerations for each drug class.   Cardiac Drugs II:  -Group instruction provided by verbal instruction and written materials to support subject.  Instructor reviews cardiac drug classes: angiotensin converting enzyme inhibitors (ACE-I), angiotensin II  receptor blockers (ARBs), nitrates, and calcium channel blockers.  Instructor discusses reasons, side effects, and lifestyle considerations for each drug class.   Anatomy and Physiology of the Circulatory System:  Group verbal and written instruction and models provide basic cardiac anatomy and physiology, with the coronary electrical and arterial systems. Review of: AMI, Angina, Valve disease, Heart Failure, Peripheral Artery Disease, Cardiac Arrhythmia, Pacemakers, and the ICD.   Other Education:  -Group or individual verbal, written, or video instructions that support the educational goals of the cardiac rehab program.   Holiday Eating Survival Tips:  -Group instruction provided by PowerPoint slides, verbal discussion, and written materials to support subject matter. The instructor gives patients tips, tricks, and techniques to help them not only survive but enjoy the holidays despite the onslaught of food that accompanies the holidays.   Knowledge Questionnaire Score: Knowledge Questionnaire Score - 01/15/19 1019      Knowledge Questionnaire Score   Pre Score  22/24       Core Components/Risk Factors/Patient Goals at Admission: Personal Goals and Risk Factors at Admission - 01/15/19 1020      Core Components/Risk Factors/Patient Goals on Admission    Weight Management  Yes;Weight Maintenance;Weight Loss    Intervention  Weight Management: Develop a combined nutrition and exercise program designed to reach desired caloric intake, while maintaining appropriate intake of nutrient and fiber, sodium and fats, and appropriate energy expenditure required for the weight goal.;Weight Management: Provide education and appropriate resources to help participant work on and attain dietary goals.    Admit Weight  160 lb 4.4 oz (72.7 kg)  Expected Outcomes  Short Term: Continue to assess and modify interventions until short term weight is achieved;Long Term: Adherence to nutrition and physical  activity/exercise program aimed toward attainment of established weight goal;Weight Maintenance: Understanding of the daily nutrition guidelines, which includes 25-35% calories from fat, 7% or less cal from saturated fats, less than 200mg  cholesterol, less than 1.5gm of sodium, & 5 or more servings of fruits and vegetables daily;Weight Loss: Understanding of general recommendations for a balanced deficit meal plan, which promotes 1-2 lb weight loss per week and includes a negative energy balance of [phone] kcal/d;Understanding recommendations for meals to include 15-35% energy as protein, 25-35% energy from fat, 35-60% energy from carbohydrates, less than 200mg  of dietary cholesterol, 20-35 gm of total fiber daily;Understanding of distribution of calorie intake throughout the day with the consumption of 4-5 meals/snacks    Diabetes  Yes    Intervention  Provide education about signs/symptoms and action to take for hypo/hyperglycemia.;Provide education about proper nutrition, including hydration, and aerobic/resistive exercise prescription along with prescribed medications to achieve blood glucose in normal ranges: Fasting glucose 65-99 mg/dL    Expected Outcomes  Short Term: Participant verbalizes understanding of the signs/symptoms and immediate care of hyper/hypoglycemia, proper foot care and importance of medication, aerobic/resistive exercise and nutrition plan for blood glucose control.;Long Term: Attainment of HbA1C < 7%.    Heart Failure  Yes    Intervention  Provide a combined exercise and nutrition program that is supplemented with education, support and counseling about heart failure. Directed toward relieving symptoms such as shortness of breath, decreased exercise tolerance, and extremity edema.    Expected Outcomes  Improve functional capacity of life;Short term: Attendance in program 2-3 days a week with increased exercise capacity. Reported lower sodium intake. Reported increased fruit and  vegetable intake. Reports medication compliance.;Short term: Daily weights obtained and reported for increase. Utilizing diuretic protocols set by physician.;Long term: Adoption of self-care skills and reduction of barriers for early signs and symptoms recognition and intervention leading to self-care maintenance.    Hypertension  Yes    Intervention  Provide education on lifestyle modifcations including regular physical activity/exercise, weight management, moderate sodium restriction and increased consumption of fresh fruit, vegetables, and low fat dairy, alcohol moderation, and smoking cessation.;Monitor prescription use compliance.    Expected Outcomes  Short Term: Continued assessment and intervention until BP is < 140/55mm HG in hypertensive participants. < 130/18mm HG in hypertensive participants with diabetes, heart failure or chronic kidney disease.;Long Term: Maintenance of blood pressure at goal levels.    Lipids  Yes    Intervention  Provide education and support for participant on nutrition & aerobic/resistive exercise along with prescribed medications to achieve LDL 70mg , HDL >40mg .    Expected Outcomes  Short Term: Participant states understanding of desired cholesterol values and is compliant with medications prescribed. Participant is following exercise prescription and nutrition guidelines.;Long Term: Cholesterol controlled with medications as prescribed, with individualized exercise RX and with personalized nutrition plan. Value goals: LDL < 70mg , HDL > 40 mg.    Stress  Yes    Intervention  Offer individual and/or small group education and counseling on adjustment to heart disease, stress management and health-related lifestyle change. Teach and support self-help strategies.;Refer participants experiencing significant psychosocial distress to appropriate mental health specialists for further evaluation and treatment. When possible, include family members and significant others in  education/counseling sessions.    Expected Outcomes  Short Term: Participant demonstrates changes in health-related behavior, relaxation and other stress  management skills, ability to obtain effective social support, and compliance with psychotropic medications if prescribed.;Long Term: Emotional wellbeing is indicated by absence of clinically significant psychosocial distress or social isolation.       Core Components/Risk Factors/Patient Goals Review:  Goals and Risk Factor Review    Row Name 01/21/19 1045             Core Components/Risk Factors/Patient Goals Review   Personal Goals Review  Weight Management/Obesity;Heart Failure;Lipids;Stress;Hypertension;Diabetes       Review  Pt with multiple CAD RFs willing to participate in CR exercise.   Giah would like to increase her independence and tone her body.        Expected Outcomes  Pt will continue to participate in CR exercise, nutrition, and lifestyle modification opportunities.           Core Components/Risk Factors/Patient Goals at Discharge (Final Review):  Goals and Risk Factor Review - 01/21/19 1045      Core Components/Risk Factors/Patient Goals Review   Personal Goals Review  Weight Management/Obesity;Heart Failure;Lipids;Stress;Hypertension;Diabetes    Review  Pt with multiple CAD RFs willing to participate in CR exercise.   Edmund would like to increase her independence and tone her body.     Expected Outcomes  Pt will continue to participate in CR exercise, nutrition, and lifestyle modification opportunities.        ITP Comments: ITP Comments    Row Name 01/15/19 1131 01/21/19 1038         ITP Comments  Dr Fransico Him, MD, Medical Director  30 Day ITP Review.  Pt started exercise today and tolerated it well.          Comments: See ITP comments.Barnet Pall, RN,BSN 01/22/2019 3:05 PM

## 2019-01-23 ENCOUNTER — Telehealth (HOSPITAL_COMMUNITY): Payer: Self-pay

## 2019-01-23 ENCOUNTER — Ambulatory Visit (HOSPITAL_COMMUNITY): Payer: No Typology Code available for payment source

## 2019-01-23 ENCOUNTER — Encounter (HOSPITAL_COMMUNITY)
Admission: RE | Admit: 2019-01-23 | Discharge: 2019-01-23 | Disposition: A | Discharge status: Home / Self Care | Payer: No Typology Code available for payment source | Source: Ambulatory Visit | Attending: Cardiology | Admitting: Cardiology

## 2019-01-23 DIAGNOSIS — I5022 Chronic systolic (congestive) heart failure: Secondary | ICD-10-CM | POA: Diagnosis not present

## 2019-01-23 LAB — GLUCOSE, CAPILLARY: Glucose-Capillary: 274 mg/dL — ABNORMAL HIGH (ref 70–99)

## 2019-01-23 MED ORDER — FUROSEMIDE 20 MG PO TABS: 20.0000 mg | ORAL_TABLET | Freq: Every day | ORAL | 0 refills | Status: DC

## 2019-01-23 MED ORDER — CARVEDILOL 25 MG PO TABS: 12.5000 mg | ORAL_TABLET | Freq: Two times a day (BID) | ORAL | 0 refills | Status: DC

## 2019-01-23 NOTE — Telephone Encounter (Signed)
Pt had low blood pressures. See cardiac rehab note. Med changes made

## 2019-01-23 NOTE — Progress Notes (Signed)
Incomplete Session Note  Patient Details  Name: Toni [name] MRN: [id number] Date of Birth: [birth year] Referring Provider:     CARDIAC REHAB PHASE II ORIENTATION from 01/15/2019 in [place]  Referring Provider  Dr. Isabell Jarvis [name] did not complete her rehab session.  [name] reported feeling lightheaded. 88/54 Sitting. Standing blood pressure 95/50 after being  Unable to get automatic standing blood pressure with the automatic BP cuff. Sitting blood pressure 76/50 sitting. Standing blood pressure in the 58 systolic. Patient reports feeling lightheaded and says she has not felt good since she started exercise on Wednesday. CBG 274.  Weight  71.9 kg. Oxygen saturation 98% on room air. Patient given a 1/2 of a cup of Gatorade. Recheck blood 84/50 sitting. Standing blood pressure 73/46. Patient still reports feeling lightheaded and was given the other cup of Gatorade. The heart failure clinic was called and notified. Spoke with Kevan Rosebush RN. [name] instructed the patient to hold her furosemide over the weekend and to restart her furosemide at 20 mg a day starting on Monday. Patient states understanding. 1030. Patient given a cup of water. Recheck blood pressure 84/57 sitting. Standing blood pressure 69/52. The heart failure clinic was called and notified about [name]'s continued low blood pressures. I spoke with [name] RN who talked with Dr Aundra Dubin. Dr Aundra Dubin said that [name] could go home as long as she felt okay and her blood pressure is greater than 80 systolic. 1040 Blood pressure 82/52 standing. 1050 Blood pressure 86/52 1055 Blood pressure 94/52 sitting           Blood pressure 82/53 standing.   Patient taken into the treatment room laid down with feet elevated. Given more water. 1105 Blood pressure 89/54 sitting Blood pressure          91/50 standing Patient taken out of the treatment room and reported that she started feeling lightheaded. Taken  back to the treatment room. Recheck blood pressure 91/59 standing. Patient walked a lap on the track and reported that she still feels a little lightheaded. Repeat blood pressure 70/51 standing with a heart rate of 88.  The heart failure clinic was called back. I spoke with Nira Conn RN. [name] spoke with Dr Aundra Dubin and instructed [name] to hold her coreg and valsartan tonight. Patient was then instructed to start taking coreg 12.5 mg twice a day and to restart her valsartan at the same dose tomorrow. Patient was told to change her position from seated to standing slowly and to call the heart failure clinic if she has any further problems or concerns. Patient states understanding. Exit blood pressure   1130 89/55 sitting Standing blood pressure 84/55. Patient denied further complaints upon exit from cardiac rehab. Patient was taken to car via wheelchair and driven home by her friend. Dr Claris Gladden office said that the patient may return to exercise on Monday if she feels okay.Barnet Pall, RN,BSN 01/23/2019 1:03 PM

## 2019-01-24 MED FILL — FUROSEMIDE 20 MG TABS: 20 | 30 days supply | Qty: 30 | Fill #0 | Status: TO

## 2019-01-26 ENCOUNTER — Encounter (HOSPITAL_COMMUNITY)
Admission: RE | Admit: 2019-01-26 | Discharge: 2019-01-26 | Disposition: A | Discharge status: Home / Self Care | Payer: No Typology Code available for payment source | Source: Ambulatory Visit | Attending: Cardiology | Admitting: Cardiology

## 2019-01-26 ENCOUNTER — Ambulatory Visit (HOSPITAL_COMMUNITY): Payer: No Typology Code available for payment source

## 2019-01-26 DIAGNOSIS — I5022 Chronic systolic (congestive) heart failure: Secondary | ICD-10-CM | POA: Diagnosis not present

## 2019-01-26 LAB — GLUCOSE, CAPILLARY
GLUCOSE-CAPILLARY: 232 mg/dL — AB (ref 70–99)
Glucose-Capillary: 219 mg/dL — ABNORMAL HIGH (ref 70–99)
Glucose-Capillary: 236 mg/dL — ABNORMAL HIGH (ref 70–99)
Glucose-Capillary: 263 mg/dL — ABNORMAL HIGH (ref 70–99)

## 2019-01-26 NOTE — Progress Notes (Signed)
Yuritzy completed exercise today without complaints or difficulty. Vital signs stable.Will continue to monitor the patient throughout  the program.Margaretha Mahan Venetia Maxon, RN,BSN 01/26/2019 12:26 PM

## 2019-01-28 ENCOUNTER — Encounter (HOSPITAL_COMMUNITY)
Admission: RE | Admit: 2019-01-28 | Discharge: 2019-01-28 | Disposition: A | Discharge status: Home / Self Care | Payer: No Typology Code available for payment source | Source: Ambulatory Visit | Attending: Cardiology | Admitting: Cardiology

## 2019-01-28 ENCOUNTER — Other Ambulatory Visit: Payer: Self-pay

## 2019-01-28 ENCOUNTER — Ambulatory Visit (HOSPITAL_COMMUNITY): Payer: No Typology Code available for payment source

## 2019-01-28 DIAGNOSIS — I5022 Chronic systolic (congestive) heart failure: Secondary | ICD-10-CM | POA: Diagnosis not present

## 2019-01-28 LAB — GLUCOSE, CAPILLARY: Glucose-Capillary: 169 mg/dL — ABNORMAL HIGH (ref 70–99)

## 2019-01-30 ENCOUNTER — Ambulatory Visit (HOSPITAL_COMMUNITY): Payer: No Typology Code available for payment source

## 2019-01-30 ENCOUNTER — Other Ambulatory Visit: Payer: Self-pay

## 2019-01-30 ENCOUNTER — Encounter (HOSPITAL_COMMUNITY)
Admission: RE | Admit: 2019-01-30 | Discharge: 2019-01-30 | Disposition: A | Discharge status: Home / Self Care | Payer: No Typology Code available for payment source | Source: Ambulatory Visit | Attending: Cardiology | Admitting: Cardiology

## 2019-01-30 DIAGNOSIS — I5022 Chronic systolic (congestive) heart failure: Secondary | ICD-10-CM | POA: Diagnosis not present

## 2019-02-02 ENCOUNTER — Ambulatory Visit (HOSPITAL_COMMUNITY): Payer: No Typology Code available for payment source

## 2019-02-02 ENCOUNTER — Telehealth (HOSPITAL_COMMUNITY): Payer: Self-pay | Admitting: Cardiac Rehabilitation

## 2019-02-02 ENCOUNTER — Encounter (HOSPITAL_COMMUNITY): Payer: No Typology Code available for payment source

## 2019-02-02 NOTE — Telephone Encounter (Signed)
Phone call to patient to notify of CR Phase II departmental closing for 2 weeks.  Pt verbalized understanding.  Joann Rion, RN, BSN Cardiac Pulmonary Rehab  

## 2019-02-03 ENCOUNTER — Ambulatory Visit (HOSPITAL_COMMUNITY)
Admission: RE | Admit: 2019-02-03 | Discharge: 2019-02-03 | Disposition: A | Discharge status: Home / Self Care | Payer: No Typology Code available for payment source | Source: Ambulatory Visit | Attending: Cardiology | Admitting: Cardiology

## 2019-02-03 ENCOUNTER — Other Ambulatory Visit: Payer: Self-pay

## 2019-02-03 ENCOUNTER — Encounter (HOSPITAL_COMMUNITY): Payer: Self-pay | Admitting: Cardiology

## 2019-02-03 VITALS — BP 126/82 | HR 82 | Wt 161.2 lb

## 2019-02-03 DIAGNOSIS — Z79899 Other long term (current) drug therapy: Secondary | ICD-10-CM | POA: Insufficient documentation

## 2019-02-03 DIAGNOSIS — I428 Other cardiomyopathies: Secondary | ICD-10-CM | POA: Diagnosis not present

## 2019-02-03 DIAGNOSIS — Z7984 Long term (current) use of oral hypoglycemic drugs: Secondary | ICD-10-CM | POA: Diagnosis not present

## 2019-02-03 DIAGNOSIS — Z833 Family history of diabetes mellitus: Secondary | ICD-10-CM | POA: Insufficient documentation

## 2019-02-03 DIAGNOSIS — Z7982 Long term (current) use of aspirin: Secondary | ICD-10-CM | POA: Insufficient documentation

## 2019-02-03 DIAGNOSIS — Z8249 Family history of ischemic heart disease and other diseases of the circulatory system: Secondary | ICD-10-CM | POA: Insufficient documentation

## 2019-02-03 DIAGNOSIS — I110 Hypertensive heart disease with heart failure: Secondary | ICD-10-CM | POA: Diagnosis not present

## 2019-02-03 DIAGNOSIS — I5022 Chronic systolic (congestive) heart failure: Secondary | ICD-10-CM | POA: Insufficient documentation

## 2019-02-03 DIAGNOSIS — E119 Type 2 diabetes mellitus without complications: Secondary | ICD-10-CM | POA: Insufficient documentation

## 2019-02-03 DIAGNOSIS — Z801 Family history of malignant neoplasm of trachea, bronchus and lung: Secondary | ICD-10-CM | POA: Diagnosis not present

## 2019-02-03 LAB — DIGOXIN LEVEL: DIGOXIN LVL: 0.8 ng/mL (ref 0.8–2.0)

## 2019-02-03 LAB — BASIC METABOLIC PANEL
Anion gap: 10 (ref 5–15)
BUN: 12 mg/dL (ref 8–23)
CO2: 24 mmol/L (ref 22–32)
Calcium: 9.9 mg/dL (ref 8.9–10.3)
Chloride: 104 mmol/L (ref 98–111)
Creatinine, Ser: 0.87 mg/dL (ref 0.44–1.00)
GFR calc Af Amer: >60 mL/min (ref >60)
GFR calc non Af Amer: >60 mL/min (ref >60)
Glucose, Bld: 216 mg/dL — ABNORMAL HIGH (ref 70–99)
Potassium: 4.1 mmol/L (ref 3.5–5.1)
Sodium: 138 mmol/L (ref 135–145)

## 2019-02-03 MED ORDER — VALSARTAN 80 MG PO TABS: 80.0000 mg | ORAL_TABLET | Freq: Two times a day (BID) | ORAL | 6 refills | Status: DC

## 2019-02-03 MED FILL — VALSARTAN 80 MG TABLET: 80 | 30 days supply | Qty: 60 | Fill #0 | Status: TO

## 2019-02-03 NOTE — Patient Instructions (Addendum)
Increase Valsartan to 40 mg in AM and 80 mg in PM for 4 DAYS ONLY, Then increase to 80 mg Twice daily   Labs done today  Labs in 10 days  Your physician recommends that you schedule a follow-up appointment in: 6 weeks  Your physician recommends that you schedule a follow-up appointment in: June with Dr Aundra Dubin and echocardiogram  You have been referred to Dr Broadus John at [facility], she specializes in genetic counseling, her office will call you for an appointment.

## 2019-02-03 NOTE — Progress Notes (Signed)
PCP: Dr. Nancy Fetter Cardiology: Dr. Tamala Julian HF Cardiology: Dr. Aundra Dubin  Toni [name] is a 63 y.o. [place] employee with a history of chronic systolic heart failure, due to nonischemic cardiomyopathy, HTN, DM, and hyperlipidemia.   Initially diagnosed with NICM in 1998 thought to be from HTN versus viral. Had cath in 1998 that was negative for coronary disease. EF at that time was 20% but EF recovered in 2012.   In October 2019, she had a cough/virus and she took OTC meds. Says she would feel better for a little while but then felt bad again. 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  She presented to [facility] ED on 11/13/18 with increased shortness of breath. She was admitted and echo was completed showing EF had gone back down to 15%. She had RHC/LHC with nonobstructive CAD and relatively preserved cardiac output. She was diuresed in the hospital and discharged. CPX in 2/20 showed only mild HF limitation.   She returns today for followup of CHF.  She has been doing well generally.  Doing cardiac rehab.  Mild lightheadedness if she stands up too fast.  No chest pain.  No dyspnea walking on flat ground.  Fatigues easily. Occasional palpitations.   Labs (1/20): K 4, creatinine 0.67 => 0.74  PMH: 1. HTN 2. Type 2 diabetes 3. Hyperlipidemia 4. Chronic systolic CHF: Nonischemic cardiomyopathy.  Diagnosed in 1998, EF 20% by echo at that time.  Echo back to normal range by 2012.   - LHC/RHC (12/19): D1 60-70% stenosis; mean RA 6, PA 58/22, mean PCWP 22, CI 2.9.  - Echo (12/19): EF 15% with severe LV dilation, moderate central MR likely functional.  - Cardiac MRI (12/19): Moderate LV dilation with EF 14%, mild RV dilation with EF 17%, LGE at the inferior RV insertion site (nonspecific).  - CPX (2/20): peak VO2 18.6, VE/VCO2 31, RER 1.18 => mild HF limitation.  5. Angioedema with ACEI  Social History    Socioeconomic History  . Marital status: Single    Spouse name: Not on file  . Number of children: Not on file  . Years of education: 29  . Highest education level: Bachelor's degree (e.g., BA, AB, BS)  Occupational History  . Occupation: Surveyor, quantity: [place]  . Financial resource strain: Not very hard  . Food insecurity:    Worry: Never true    Inability: Never true  . Transportation needs:    Medical: No    Non-medical: No  Tobacco Use  . Smoking status: Never Smoker  . Smokeless tobacco: Never Used  Substance and Sexual Activity  . Alcohol use: Yes    Alcohol/week: 0.0 standard drinks    Comment: less than once a month  . Drug use: No  . Sexual activity: Never  Lifestyle  . Physical activity:    Days per week: 0 days    Minutes per session: 0 min  . Stress: To some extent  Relationships  . Social connections:    Talks on phone: Not on file    Gets together: Not on file    Attends religious service: Not on file    Active member of club or organization: Not on file    Attends meetings of clubs or organizations: Not on file    Relationship status: Not on file  . Intimate partner violence:  Fear of current or ex partner: Not on file    Emotionally abused: Not on file    Physically abused: Not on file    Forced sexual activity: Not on file  Other Topics Concern  . Not on file  Social History Narrative   Works at [employer].  Lives alone.     Family History  Problem Relation Age of Onset  . Diabetes Mellitus I Mother   . Lung cancer Mother   . Diabetes Mellitus I Father   . Sudden death Father 64  . Hypertension Brother   . Hypertension Brother   . Diabetes Mellitus I Brother   . Benign prostatic hyperplasia Brother   . Heart failure Paternal Uncle    ROS: All systems reviewed and negative except as per HPI.   Current Outpatient Medications  Medication Sig Dispense Refill  . aspirin 81 MG chewable tablet Chew 1 tablet  (81 mg total) by mouth daily. 30 tablet 0  . carvedilol (COREG) 25 MG tablet Take 0.5 tablets (12.5 mg total) by mouth 2 (two) times daily with a meal. 30 tablet 0  . dapagliflozin propanediol (FARXIGA) 5 MG TABS tablet Take 5 mg by mouth daily.    . digoxin (LANOXIN) 0.125 MG tablet Take 1 tablet (0.125 mg total) by mouth daily. 90 tablet 3  . furosemide (LASIX) 20 MG tablet Take 1 tablet (20 mg total) by mouth daily. 30 tablet 0  . glipiZIDE (GLUCOTROL XL) 10 MG 24 hr tablet Take 10 mg by mouth 2 (two) times daily with a meal.     . Liraglutide (VICTOZA [place]) Inject 1.8 mg into the skin every evening.     . metFORMIN (GLUCOPHAGE) 1000 MG tablet Take 1,000 mg by mouth 2 (two) times daily with a meal.    . Multiple Vitamins-Minerals (CENTRUM SILVER PO) Take 1 tablet by mouth daily. Reported on 02/10/2016    . simvastatin (ZOCOR) 20 MG tablet Take 20 mg by mouth every evening.    Marland Kitchen spironolactone (ALDACTONE) 25 MG tablet Take 1 tablet (25 mg total) by mouth every evening. 30 tablet 6  . TRUE METRIX BLOOD GLUCOSE TEST test strip Use as directed.  5  . TRUEPLUS LANCETS 30G MISC Use as directed.  5  . valsartan (DIOVAN) 80 MG tablet Take 1 tablet (80 mg total) by mouth 2 (two) times daily. 60 tablet 6   No current facility-administered medications for this encounter.    BP 126/82   Pulse 82   Wt 73.1 kg (161 lb 3.2 oz)   SpO2 96%   BMI 26.42 kg/m  General: NAD Neck: No JVD, no thyromegaly or thyroid nodule.  Lungs: Clear to auscultation bilaterally with normal respiratory effort. CV: Nondisplaced PMI.  Heart regular S1/S2, no S3/S4, no murmur.  No peripheral edema.  No carotid bruit.  Normal pedal pulses.  Abdomen: Soft, nontender, no hepatosplenomegaly, no distention.  Skin: Intact without lesions or rashes.  Neurologic: Alert and oriented x 3.  Psych: Normal affect. Extremities: No clubbing or cyanosis.  HEENT: Normal.   Assessment/Plan: 1. Chronic systolic CHF: Nonischemic  cardiomyopathy by 12/19 cath.  Cardiac MRI with LV EF 14%, RV EF 17%. No definite evidence for myocarditis or infiltrative disease by delayed enhancement images.  Most likely cause of cardiomyopathy is familial versus prior viral myocarditis.  Brother also had a cardiomyopathy of uncertain etiology.  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  CPX in 2/20 showed only mild HF  limitation. On exam, she is not volume overloaded.  NYHA class II symptoms, not volume overloaded on exam.  Narrow QRS, not CRT candidate.  - Continue Coreg 25 mg bid.  - Increase valsartan to 40 qam/80 qpm x 4 days, then increase to 80 mg bid.  BMET today and in 10 days.  She cannot take Entresto with history of ACEI angioedema.  - Continue dapagliflozin.  - Continue spironolactone 25 mg daily.  - Continue digoxin, check level today.  - Repeat echo at 6 months in 6/20, if EF remains low will need ICD.  - I will refer her to Dr Broadus John to discuss results of genetic testing (LMNA gene variant of uncertain significance).  2. Type II diabetes: She is on dapagliflozin.   Followup in with APP in 6 wks, see me in 3 months with echo.   Loralie Champagne 02/03/2019

## 2019-02-04 ENCOUNTER — Encounter (HOSPITAL_COMMUNITY): Payer: No Typology Code available for payment source

## 2019-02-04 ENCOUNTER — Ambulatory Visit (HOSPITAL_COMMUNITY): Payer: No Typology Code available for payment source

## 2019-02-05 ENCOUNTER — Encounter (HOSPITAL_COMMUNITY): Payer: Self-pay | Admitting: *Deleted

## 2019-02-05 DIAGNOSIS — I5022 Chronic systolic (congestive) heart failure: Secondary | ICD-10-CM

## 2019-02-06 ENCOUNTER — Ambulatory Visit (HOSPITAL_COMMUNITY): Payer: No Typology Code available for payment source

## 2019-02-06 ENCOUNTER — Encounter (HOSPITAL_COMMUNITY): Payer: No Typology Code available for payment source

## 2019-02-06 NOTE — Addendum Note (Signed)
Encounter addended by: Scarlette Calico, RN on: 02/06/2019 11:39 AM  Actions taken: Diagnosis association updated, Order list changed

## 2019-02-09 ENCOUNTER — Ambulatory Visit (HOSPITAL_COMMUNITY): Payer: Self-pay | Admitting: *Deleted

## 2019-02-09 ENCOUNTER — Telehealth (HOSPITAL_COMMUNITY): Payer: Self-pay | Admitting: *Deleted

## 2019-02-09 ENCOUNTER — Ambulatory Visit (HOSPITAL_COMMUNITY): Payer: No Typology Code available for payment source

## 2019-02-09 ENCOUNTER — Encounter (HOSPITAL_COMMUNITY): Payer: No Typology Code available for payment source

## 2019-02-09 ENCOUNTER — Encounter (HOSPITAL_COMMUNITY): Payer: Self-pay | Admitting: *Deleted

## 2019-02-09 DIAGNOSIS — I5022 Chronic systolic (congestive) heart failure: Secondary | ICD-10-CM

## 2019-02-09 NOTE — Progress Notes (Signed)
Cardiac Individual Treatment Plan  Patient Details  Name: Toni [name] MRN: [id number] Date of Birth: 10/27/1956 Referring Provider:     CARDIAC REHAB PHASE II ORIENTATION from 01/15/2019 in [facility]  Referring Provider  Dr. Aundra Dubin      Initial Encounter Date:    CARDIAC REHAB PHASE II ORIENTATION from 01/15/2019 in [facility]  Date  01/15/19      Visit Diagnosis: Chronic systolic CHF (congestive heart failure) ([place])  Patient's Home Medications on Admission:  Current Outpatient Medications:  .  aspirin 81 MG chewable tablet, Chew 1 tablet (81 mg total) by mouth daily., Disp: 30 tablet, Rfl: 0 .  carvedilol (COREG) 25 MG tablet, Take 0.5 tablets (12.5 mg total) by mouth 2 (two) times daily with a meal., Disp: 30 tablet, Rfl: 0 .  dapagliflozin propanediol (FARXIGA) 5 MG TABS tablet, Take 5 mg by mouth daily., Disp: , Rfl:  .  digoxin (LANOXIN) 0.125 MG tablet, Take 1 tablet (0.125 mg total) by mouth daily., Disp: 90 tablet, Rfl: 3 .  furosemide (LASIX) 20 MG tablet, Take 1 tablet (20 mg total) by mouth daily., Disp: 30 tablet, Rfl: 0 .  glipiZIDE (GLUCOTROL XL) 10 MG 24 hr tablet, Take 10 mg by mouth 2 (two) times daily with a meal. , Disp: , Rfl:  .  Liraglutide (VICTOZA [place]), Inject 1.8 mg into the skin every evening. , Disp: , Rfl:  .  metFORMIN (GLUCOPHAGE) 1000 MG tablet, Take 1,000 mg by mouth 2 (two) times daily with a meal., Disp: , Rfl:  .  Multiple Vitamins-Minerals (CENTRUM SILVER PO), Take 1 tablet by mouth daily. Reported on 02/10/2016, Disp: , Rfl:  .  simvastatin (ZOCOR) 20 MG tablet, Take 20 mg by mouth every evening., Disp: , Rfl:  .  spironolactone (ALDACTONE) 25 MG tablet, Take 1 tablet (25 mg total) by mouth every evening., Disp: 30 tablet, Rfl: 6 .  TRUE METRIX BLOOD GLUCOSE TEST test strip, Use as directed., Disp: , Rfl: 5 .  TRUEPLUS LANCETS 30G MISC, Use as directed., Disp: , Rfl: 5 .  valsartan  (DIOVAN) 80 MG tablet, Take 1 tablet (80 mg total) by mouth 2 (two) times daily., Disp: 60 tablet, Rfl: 6  Past Medical History: Past Medical History:  Diagnosis Date  . CHF (congestive heart failure) ([place])   . Diabetes mellitus without complication ([place])   . Hypertension     Tobacco Use: Social History   Tobacco Use  Smoking Status Never Smoker  Smokeless Tobacco Never Used    Labs: Recent Review Flowsheet Data    Labs for ITP Cardiac and Pulmonary Rehab Latest Ref Rng & Units 10/26/2016 11/14/2018 11/15/2018 11/17/2018 11/17/2018   Cholestrol 0 - 200 mg/dL - - 182 - -   LDLCALC 0 - 99 mg/dL - - 117(H) - -   HDL >40 mg/dL - - 38(L) - -   Trlycerides <150 mg/dL - - 134 - -   Hemoglobin A1c 4.8 - 5.6 % 7.6 6.9(H) - - -   PHART 7.350 - 7.450 - - - - 7.307(L)   PCO2ART 32.0 - 48.0 mmHg - - - - 43.9   HCO3 20.0 - 28.0 mmol/L - - - 25.5 22.0   TCO2 22 - 32 mmol/L - - - 27 23   ACIDBASEDEF 0.0 - 2.0 mmol/L - - - 1.0 4.0(H)   O2SAT % - - - 66.0 93.0      Capillary Blood  Glucose: Lab Results  Component Value Date   GLUCAP 169 (H) 01/28/2019   GLUCAP 232 (H) 01/26/2019   GLUCAP 263 (H) 01/26/2019   GLUCAP 274 (H) 01/23/2019   GLUCAP 236 (H) 01/21/2019     Exercise Target Goals: Exercise Program Goal: Individual exercise prescription set using results from initial 6 min walk test and THRR while considering  patient's activity barriers and safety.   Exercise Prescription Goal: Initial exercise prescription builds to 30-45 minutes a day of aerobic activity, 2-3 days per week.  Home exercise guidelines will be given to patient during program as part of exercise prescription that the participant will acknowledge.  Activity Barriers & Risk Stratification: Activity Barriers & Cardiac Risk Stratification - 01/15/19 1011      Activity Barriers & Cardiac Risk Stratification   Activity Barriers  Muscular Weakness;Deconditioning    Cardiac Risk Stratification  High       6  Minute Walk: 6 Minute Walk    Row Name 01/15/19 1010         6 Minute Walk   Phase  Initial     Distance  1378 feet     Walk Time  6 minutes     # of Rest Breaks  0     MPH  2.6     METS  3.25     RPE  11     Perceived Dyspnea   0     VO2 Peak  11.41     Symptoms  No     Resting HR  68 bpm     Resting BP  102/70     Resting Oxygen Saturation   99 %     Exercise Oxygen Saturation  during 6 min walk  98 %     Max Ex. HR  93 bpm     Max Ex. BP  112/70     2 Minute Post BP  102/68        Oxygen Initial Assessment:   Oxygen Re-Evaluation:   Oxygen Discharge (Final Oxygen Re-Evaluation):   Initial Exercise Prescription: Initial Exercise Prescription - 01/15/19 1000      Date of Initial Exercise RX and Referring Provider   Date  01/15/19    Referring Provider  Dr. Aundra Dubin    Expected Discharge Date  04/22/19      Treadmill   MPH  2.8    Grade  0    Minutes  10    METs  3.2      Bike   Level  0.5    Minutes  10    METs  2.35      NuStep   Level  2    SPM  75    Minutes  10    METs  3.2      Prescription Details   Frequency (times per week)  3    Duration  Progress to 30 minutes of continuous aerobic without signs/symptoms of physical distress      Intensity   THRR 40-80% of Max Heartrate  63-126    Ratings of Perceived Exertion  11-13      Progression   Progression  Continue to progress workloads to maintain intensity without signs/symptoms of physical distress.      Resistance Training   Training Prescription  Yes    Weight  3 lbs.     Reps  10-15       Perform Capillary Blood Glucose checks as needed.  Exercise Prescription  Changes: Exercise Prescription Changes    Row Name 01/21/19 0958 01/26/19 0954 01/30/19 0952         Response to Exercise   Blood Pressure (Admit)  110/76  109/76  104/78     Blood Pressure (Exercise)  128/74  120/62  118/78     Blood Pressure (Exit)  106/62  98/70  102/74     Heart Rate (Admit)  91 bpm  79 bpm   90 bpm     Heart Rate (Exercise)  127 bpm  117 bpm  119 bpm     Heart Rate (Exit)  89 bpm  76 bpm  88 bpm     Rating of Perceived Exertion (Exercise)  12  13  12      Symptoms  Pt c/o feeling "swimmy headed" on bike. Resolvedwith rest.  -  -     Comments  Will switch from [employer] bike to [employer] bike for comfort.  Will switch from [employer] bike to [employer] bike for comfort.  -     Duration  Progress to 30 minutes of  aerobic without signs/symptoms of physical distress  Progress to 30 minutes of  aerobic without signs/symptoms of physical distress  Progress to 30 minutes of  aerobic without signs/symptoms of physical distress     Intensity  THRR unchanged  THRR unchanged  THRR unchanged       Progression   Progression  Continue to progress workloads to maintain intensity without signs/symptoms of physical distress.  Continue to progress workloads to maintain intensity without signs/symptoms of physical distress.  Continue to progress workloads to maintain intensity without signs/symptoms of physical distress.     Average METs  2.3  2.7 Missed stepper average  2.7       Resistance Training   Training Prescription  No Relaxation day, no weights.  Yes  Yes     Weight  -  3lbs  3lbs     Reps  -  10-15  10-15     Time  -  10 Minutes  10 Minutes       Interval Training   Interval Training  No  No  No       Treadmill   MPH  2.8  2.8  2.8     Grade  0  0  0     Minutes  10  10  10      METs  3.14  3.14  3.14       Bike   Level  0.5  0.5  2 Switched from [employer] to [employer] bike     Minutes  10  10  10      METs  2.29  2.29  2.3       NuStep   Level  2  2  2      SPM  75  75  75     Minutes  10  10  10      METs  1.5  1.5  2.8        Exercise Comments: Exercise Comments    Row Name 01/21/19 1051 01/26/19 1035 02/09/19 0942       Exercise Comments  Patient tolerated first exercise session fairly well. Patient found the [employer]yne bike seat uncomfortable and c/o feeling "swimmy headed" on the  bike. Will try [employer] bike next session. Symptoms resolved.  Reviewed METs and goals with patient.  Reviewed home exercise guidelines with patient via telephone call. Home exercise packet mailed to patient.  Exercise Goals and Review: Exercise Goals    Row Name 01/15/19 1018             Exercise Goals   Increase Physical Activity  Yes       Intervention  Provide advice, education, support and counseling about physical activity/exercise needs.;Develop an individualized exercise prescription for aerobic and resistive training based on initial evaluation findings, risk stratification, comorbidities and participant's personal goals.       Expected Outcomes  Short Term: Attend rehab on a regular basis to increase amount of physical activity.       Increase Strength and Stamina  Yes       Intervention  Provide advice, education, support and counseling about physical activity/exercise needs.;Develop an individualized exercise prescription for aerobic and resistive training based on initial evaluation findings, risk stratification, comorbidities and participant's personal goals.       Expected Outcomes  Short Term: Increase workloads from initial exercise prescription for resistance, speed, and METs.       Able to understand and use rate of perceived exertion (RPE) scale  Yes       Intervention  Provide education and explanation on how to use RPE scale       Expected Outcomes  Short Term: Able to use RPE daily in rehab to express subjective intensity level;Long Term:  Able to use RPE to guide intensity level when exercising independently       Knowledge and understanding of Target Heart Rate Range (THRR)  Yes       Intervention  Provide education and explanation of THRR including how the numbers were predicted and where they are located for reference       Expected Outcomes  Short Term: Able to state/look up THRR;Long Term: Able to use THRR to govern intensity when exercising  independently;Short Term: Able to use daily as guideline for intensity in rehab       Able to check pulse independently  Yes       Intervention  Provide education and demonstration on how to check pulse in carotid and radial arteries.;Review the importance of being able to check your own pulse for safety during independent exercise       Expected Outcomes  Short Term: Able to explain why pulse checking is important during independent exercise;Long Term: Able to check pulse independently and accurately       Understanding of Exercise Prescription  Yes       Intervention  Provide education, explanation, and written materials on patient's individual exercise prescription       Expected Outcomes  Short Term: Able to explain program exercise prescription;Long Term: Able to explain home exercise prescription to exercise independently          Exercise Goals Re-Evaluation : Exercise Goals Re-Evaluation    Row Name 01/21/19 1051 01/26/19 1035 02/04/19 1356 02/09/19 0942       Exercise Goal Re-Evaluation   Exercise Goals Review  Increase Physical Activity;Able to understand and use rate of perceived exertion (RPE) scale  Increase Physical Activity;Able to understand and use rate of perceived exertion (RPE) scale;Increase Strength and Stamina  -  Increase Physical Activity;Able to understand and use rate of perceived exertion (RPE) scale;Increase Strength and Stamina;Knowledge and understanding of Target Heart Rate Range (THRR);Understanding of Exercise Prescription;Able to check pulse independently    Comments  Patient able to understand and use RPE scale appropriately.   Patient tolerated exercise well today without any symptoms. Pt has a treadmill at  home, which she is not currently using.   Temporary department closure due to COVID-19.  Reviewed home exercise guidelines with patient includign THRR, RPE scale, and endpoints for exercise. Pt verbalizes understanding. Patient is walking on treadmill at home  or outside, 30 minutes, Monday-Friday. Pt states she developped a "knot" under big toe of her left foot, so she is holding exercise until that resolves. Pt will contact PCP if problem presists.    Expected Outcomes  Increase workloads as tolerated to help achieve personal health and fitness goals.  Progress workloads to help improve stamina.   -  Patient will continue walking 30 minutes, 5 days/week to help improve cardiorespiratory fitness.       Discharge Exercise Prescription (Final Exercise Prescription Changes): Exercise Prescription Changes - 01/30/19 0952      Response to Exercise   Blood Pressure (Admit)  104/78    Blood Pressure (Exercise)  118/78    Blood Pressure (Exit)  102/74    Heart Rate (Admit)  90 bpm    Heart Rate (Exercise)  119 bpm    Heart Rate (Exit)  88 bpm    Rating of Perceived Exertion (Exercise)  12    Duration  Progress to 30 minutes of  aerobic without signs/symptoms of physical distress    Intensity  THRR unchanged      Progression   Progression  Continue to progress workloads to maintain intensity without signs/symptoms of physical distress.    Average METs  2.7      Resistance Training   Training Prescription  Yes    Weight  3lbs    Reps  10-15    Time  10 Minutes      Interval Training   Interval Training  No      Treadmill   MPH  2.8    Grade  0    Minutes  10    METs  3.14      Bike   Level  2   Switched from [employer] to [employer] bike   Minutes  10    METs  2.3      NuStep   Level  2    SPM  75    Minutes  10    METs  2.8       Nutrition:  Target Goals: Understanding of nutrition guidelines, daily intake of sodium 1500mg , cholesterol 200mg , calories 30% from fat and 7% or less from saturated fats, daily to have 5 or more servings of fruits and vegetables.  Biometrics: Pre Biometrics - 01/15/19 1018      Pre Biometrics   Height  5' 5.5" (1.664 m)    Weight  160 lb 4.4 oz (72.7 kg)    Waist Circumference  33 inches    Hip  Circumference  38.5 inches    Waist to Hip Ratio  0.86 %    BMI (Calculated)  26.26    Triceps Skinfold  29 mm    % Body Fat  37.1 %    Grip Strength  23 kg    Flexibility  18 in    Single Leg Stand  30 seconds        Nutrition Therapy Plan and Nutrition Goals: Nutrition Therapy & Goals - 01/16/19 0844      Nutrition Therapy   Diet  heart healthy, carb modified      Personal Nutrition Goals   Nutrition Goal  Pt to identify and limit food sources of saturated fat, trans fat,  refined carbohydrates and sodium    Personal Goal #2  Pt to identify food quantities necessary to achieve weight loss of 6-24 lb at graduation from cardiac rehab.    Personal Goal #3  Pt to build a healthy plate including vegetables, fruits, whole grains, and low-fat dairy products in a heart healthy meal plan.    Personal Goal #4  Pt to check blood sugars daily      Intervention Plan   Intervention  Prescribe, educate and counsel regarding individualized specific dietary modifications aiming towards targeted core components such as weight, hypertension, lipid management, diabetes, heart failure and other comorbidities.    Expected Outcomes  Short Term Goal: Understand basic principles of dietary content, such as calories, fat, sodium, cholesterol and nutrients.;Long Term Goal: Adherence to prescribed nutrition plan.       Nutrition Assessments: Nutrition Assessments - 01/16/19 0845      MEDFICTS Scores   Pre Score  50       Nutrition Goals Re-Evaluation: Nutrition Goals Re-Evaluation    [place] Name 01/16/19 0844             Goals   Current Weight  160 lb 4.4 oz (72.7 kg)          Nutrition Goals Re-Evaluation: Nutrition Goals Re-Evaluation    [place] Name 01/16/19 0844             Goals   Current Weight  160 lb 4.4 oz (72.7 kg)          Nutrition Goals Discharge (Final Nutrition Goals Re-Evaluation): Nutrition Goals Re-Evaluation - 01/16/19 0844      Goals   Current Weight  160 lb 4.4  oz (72.7 kg)       Psychosocial: Target Goals: Acknowledge presence or absence of significant depression and/or stress, maximize coping skills, provide positive support system. Participant is able to verbalize types and ability to use techniques and skills needed for reducing stress and depression.  Initial Review & Psychosocial Screening: Initial Psych Review & Screening - 01/15/19 1132      Initial Review   Current issues with  Current Stress Concerns    Source of Stress Concerns  Unable to perform yard/household activities;Chronic Illness    Comments  Mardene is on short term disability from her job at [employer]?  Yes   Kenita has her family and friends for support     Barriers   Psychosocial barriers to participate in program  The patient should benefit from training in stress management and relaxation.      Screening Interventions   Interventions  Encouraged to exercise    Expected Outcomes  Long Term Goal: Stressors or current issues are controlled or eliminated.;Short Term goal: Identification and review with participant of any Quality of Life or Depression concerns found by scoring the questionnaire.;Short Term goal: Utilizing psychosocial counselor, staff and physician to assist with identification of specific Stressors or current issues interfering with healing process. Setting desired goal for each stressor or current issue identified.;Long Term goal: The participant improves quality of Life and PHQ9 Scores as seen by post scores and/or verbalization of changes       Quality of Life Scores: Quality of Life - 01/15/19 1019      Quality of Life   Select  Quality of Life      Quality of Life Scores   Health/Function Pre  15.27 %    Socioeconomic  Pre  21.44 %    Psych/Spiritual Pre  20.29 %    Family Pre  27.1 %    GLOBAL Pre  19.37 %      Scores of 19 and below usually indicate a poorer quality of life in these areas.  A  difference of  2-3 points is a clinically meaningful difference.  A difference of 2-3 points in the total score of the Quality of Life Index has been associated with significant improvement in overall quality of life, self-image, physical symptoms, and general health in studies assessing change in quality of life.  PHQ-9: Recent Review Flowsheet Data    Depression screen [facility] 2/9 01/21/2019   Decreased Interest 0   Down, Depressed, Hopeless 0   PHQ - 2 Score 0     Interpretation of Total Score  Total Score Depression Severity:  1-4 = Minimal depression, 5-9 = Mild depression, 10-14 = Moderate depression, 15-19 = Moderately severe depression, 20-27 = Severe depression   Psychosocial Evaluation and Intervention: Psychosocial Evaluation - 01/21/19 1039      Psychosocial Evaluation & Interventions   Interventions  Encouraged to exercise with the program and follow exercise prescription;Stress management education;Relaxation education    Comments  Pt is currently on short term disability from her job.  This is stressful to her. Trinisha enjoys going to [employer].    Expected Outcomes  Deyna will report ability to manage her stress and maintain a positive outlook.     Continue Psychosocial Services   Follow up required by staff       Psychosocial Re-Evaluation: Psychosocial Re-Evaluation    [place] Name 02/05/19 1112             Psychosocial Re-Evaluation   Current issues with  Current Stress Concerns       Comments  unable to access as exercise is currently on hold       Comments  Emira is on short term disability from her job at [employer] of Stress Concerns  Unable to perform yard/household activities;Chronic Illness          Psychosocial Discharge (Final Psychosocial Re-Evaluation): Psychosocial Re-Evaluation - 02/05/19 1112      Psychosocial Re-Evaluation   Current issues with  Current Stress Concerns    Comments  unable to access as  exercise is currently on hold    Comments  Nikya is on short term disability from her job at [employer] of Stress Concerns  Unable to perform yard/household activities;Chronic Illness       Vocational Rehabilitation: Provide vocational rehab assistance to qualifying candidates.   Vocational Rehab Evaluation & Intervention: Vocational Rehab - 01/15/19 1144      Initial Vocational Rehab Evaluation & Intervention   Assessment shows need for Vocational Rehabilitation  Yes   Will give voacational rehab packet for Ms [employer] to review      Education: Education Goals: Education classes will be provided on a weekly basis, covering required topics. Participant will state understanding/return demonstration of topics presented.  Learning Barriers/Preferences: Learning Barriers/Preferences - 01/15/19 1019      Learning Barriers/Preferences   Learning Barriers  Sight    Learning Preferences  Skilled Demonstration       Education Topics: Count Your Pulse:  -Group instruction provided by verbal instruction, demonstration, patient participation and written materials to support subject.  Instructors address  importance of being able to find your pulse and how to count your pulse when at home without a heart monitor.  Patients get hands on experience counting their pulse with staff help and individually.   Heart Attack, Angina, and Risk Factor Modification:  -Group instruction provided by verbal instruction, video, and written materials to support subject.  Instructors address signs and symptoms of angina and heart attacks.    Also discuss risk factors for heart disease and how to make changes to improve heart health risk factors.   Functional Fitness:  -Group instruction provided by verbal instruction, demonstration, patient participation, and written materials to support subject.  Instructors address safety measures for doing things around the house.  Discuss how  to get up and down off the floor, how to pick things up properly, how to safely get out of a chair without assistance, and balance training.   Meditation and Mindfulness:  -Group instruction provided by verbal instruction, patient participation, and written materials to support subject.  Instructor addresses importance of mindfulness and meditation practice to help reduce stress and improve awareness.  Instructor also leads participants through a meditation exercise.    CARDIAC REHAB PHASE II EXERCISE from 01/30/2019 in [place]  Date  01/21/19  Instruction Review Code  2- Demonstrated Understanding      Stretching for Flexibility and Mobility:  -Group instruction provided by verbal instruction, patient participation, and written materials to support subject.  Instructors lead participants through series of stretches that are designed to increase flexibility thus improving mobility.  These stretches are additional exercise for major muscle groups that are typically performed during regular warm up and cool down.   Hands Only CPR:  -Group verbal, video, and participation provides a basic overview of AHA guidelines for community CPR. Role-play of emergencies allow participants the opportunity to practice calling for help and chest compression technique with discussion of AED use.   Hypertension: -Group verbal and written instruction that provides a basic overview of hypertension including the most recent diagnostic guidelines, risk factor reduction with self-care instructions and medication management.   CARDIAC REHAB PHASE II EXERCISE from 01/30/2019 in [place]  Date  01/30/19  Instruction Review Code  2- Demonstrated Understanding       Nutrition I class: Heart Healthy Eating:  -Group instruction provided by PowerPoint slides, verbal discussion, and written materials to support subject matter. The instructor gives an  explanation and review of the Therapeutic Lifestyle Changes diet recommendations, which includes a discussion on lipid goals, dietary fat, sodium, fiber, plant stanol/sterol esters, sugar, and the components of a well-balanced, healthy diet.   Nutrition II class: Lifestyle Skills:  -Group instruction provided by PowerPoint slides, verbal discussion, and written materials to support subject matter. The instructor gives an explanation and review of label reading, grocery shopping for heart health, heart healthy recipe modifications, and ways to make healthier choices when eating out.   Diabetes Question & Answer:  -Group instruction provided by PowerPoint slides, verbal discussion, and written materials to support subject matter. The instructor gives an explanation and review of diabetes co-morbidities, pre- and post-prandial blood glucose goals, pre-exercise blood glucose goals, signs, symptoms, and treatment of hypoglycemia and hyperglycemia, and foot care basics.   Diabetes Blitz:  -Group instruction provided by PowerPoint slides, verbal discussion, and written materials to support subject matter. The instructor gives an explanation and review of the physiology behind type 1 and type 2 diabetes, diabetes medications and  rational behind using different medications, pre- and post-prandial blood glucose recommendations and Hemoglobin A1c goals, diabetes diet, and exercise including blood glucose guidelines for exercising safely.    Portion Distortion:  -Group instruction provided by PowerPoint slides, verbal discussion, written materials, and food models to support subject matter. The instructor gives an explanation of serving size versus portion size, changes in portions sizes over the last 20 years, and what consists of a serving from each food group.   Stress Management:  -Group instruction provided by verbal instruction, video, and written materials to support subject matter.  Instructors  review role of stress in heart disease and how to cope with stress positively.     Exercising on Your Own:  -Group instruction provided by verbal instruction, power point, and written materials to support subject.  Instructors discuss benefits of exercise, components of exercise, frequency and intensity of exercise, and end points for exercise.  Also discuss use of nitroglycerin and activating EMS.  Review options of places to exercise outside of rehab.  Review guidelines for sex with heart disease.   Cardiac Drugs I:  -Group instruction provided by verbal instruction and written materials to support subject.  Instructor reviews cardiac drug classes: antiplatelets, anticoagulants, beta blockers, and statins.  Instructor discusses reasons, side effects, and lifestyle considerations for each drug class.   Cardiac Drugs II:  -Group instruction provided by verbal instruction and written materials to support subject.  Instructor reviews cardiac drug classes: angiotensin converting enzyme inhibitors (ACE-I), angiotensin II receptor blockers (ARBs), nitrates, and calcium channel blockers.  Instructor discusses reasons, side effects, and lifestyle considerations for each drug class.   Anatomy and Physiology of the Circulatory System:  Group verbal and written instruction and models provide basic cardiac anatomy and physiology, with the coronary electrical and arterial systems. Review of: AMI, Angina, Valve disease, Heart Failure, Peripheral Artery Disease, Cardiac Arrhythmia, Pacemakers, and the ICD.   Other Education:  -Group or individual verbal, written, or video instructions that support the educational goals of the cardiac rehab program.   Holiday Eating Survival Tips:  -Group instruction provided by PowerPoint slides, verbal discussion, and written materials to support subject matter. The instructor gives patients tips, tricks, and techniques to help them not only survive but enjoy the holidays  despite the onslaught of food that accompanies the holidays.   Knowledge Questionnaire Score: Knowledge Questionnaire Score - 01/15/19 1019      Knowledge Questionnaire Score   Pre Score  22/24       Core Components/Risk Factors/Patient Goals at Admission: Personal Goals and Risk Factors at Admission - 01/15/19 1020      Core Components/Risk Factors/Patient Goals on Admission    Weight Management  Yes;Weight Maintenance;Weight Loss    Intervention  Weight Management: Develop a combined nutrition and exercise program designed to reach desired caloric intake, while maintaining appropriate intake of nutrient and fiber, sodium and fats, and appropriate energy expenditure required for the weight goal.;Weight Management: Provide education and appropriate resources to help participant work on and attain dietary goals.    Admit Weight  160 lb 4.4 oz (72.7 kg)    Expected Outcomes  Short Term: Continue to assess and modify interventions until short term weight is achieved;Long Term: Adherence to nutrition and physical activity/exercise program aimed toward attainment of established weight goal;Weight Maintenance: Understanding of the daily nutrition guidelines, which includes 25-35% calories from fat, 7% or less cal from saturated fats, less than 200mg  cholesterol, less than 1.5gm of sodium, & 5 or  more servings of fruits and vegetables daily;Weight Loss: Understanding of general recommendations for a balanced deficit meal plan, which promotes 1-2 lb weight loss per week and includes a negative energy balance of [phone] kcal/d;Understanding recommendations for meals to include 15-35% energy as protein, 25-35% energy from fat, 35-60% energy from carbohydrates, less than 200mg  of dietary cholesterol, 20-35 gm of total fiber daily;Understanding of distribution of calorie intake throughout the day with the consumption of 4-5 meals/snacks    Diabetes  Yes    Intervention  Provide education about  signs/symptoms and action to take for hypo/hyperglycemia.;Provide education about proper nutrition, including hydration, and aerobic/resistive exercise prescription along with prescribed medications to achieve blood glucose in normal ranges: Fasting glucose 65-99 mg/dL    Expected Outcomes  Short Term: Participant verbalizes understanding of the signs/symptoms and immediate care of hyper/hypoglycemia, proper foot care and importance of medication, aerobic/resistive exercise and nutrition plan for blood glucose control.;Long Term: Attainment of HbA1C < 7%.    Heart Failure  Yes    Intervention  Provide a combined exercise and nutrition program that is supplemented with education, support and counseling about heart failure. Directed toward relieving symptoms such as shortness of breath, decreased exercise tolerance, and extremity edema.    Expected Outcomes  Improve functional capacity of life;Short term: Attendance in program 2-3 days a week with increased exercise capacity. Reported lower sodium intake. Reported increased fruit and vegetable intake. Reports medication compliance.;Short term: Daily weights obtained and reported for increase. Utilizing diuretic protocols set by physician.;Long term: Adoption of self-care skills and reduction of barriers for early signs and symptoms recognition and intervention leading to self-care maintenance.    Hypertension  Yes    Intervention  Provide education on lifestyle modifcations including regular physical activity/exercise, weight management, moderate sodium restriction and increased consumption of fresh fruit, vegetables, and low fat dairy, alcohol moderation, and smoking cessation.;Monitor prescription use compliance.    Expected Outcomes  Short Term: Continued assessment and intervention until BP is < 140/25mm HG in hypertensive participants. < 130/64mm HG in hypertensive participants with diabetes, heart failure or chronic kidney disease.;Long Term: Maintenance  of blood pressure at goal levels.    Lipids  Yes    Intervention  Provide education and support for participant on nutrition & aerobic/resistive exercise along with prescribed medications to achieve LDL 70mg , HDL >40mg .    Expected Outcomes  Short Term: Participant states understanding of desired cholesterol values and is compliant with medications prescribed. Participant is following exercise prescription and nutrition guidelines.;Long Term: Cholesterol controlled with medications as prescribed, with individualized exercise RX and with personalized nutrition plan. Value goals: LDL < 70mg , HDL > 40 mg.    Stress  Yes    Intervention  Offer individual and/or small group education and counseling on adjustment to heart disease, stress management and health-related lifestyle change. Teach and support self-help strategies.;Refer participants experiencing significant psychosocial distress to appropriate mental health specialists for further evaluation and treatment. When possible, include family members and significant others in education/counseling sessions.    Expected Outcomes  Short Term: Participant demonstrates changes in health-related behavior, relaxation and other stress management skills, ability to obtain effective social support, and compliance with psychotropic medications if prescribed.;Long Term: Emotional wellbeing is indicated by absence of clinically significant psychosocial distress or social isolation.       Core Components/Risk Factors/Patient Goals Review:  Goals and Risk Factor Review    Row Name 01/21/19 1045 02/05/19 1113  Core Components/Risk Factors/Patient Goals Review   Personal Goals Review  Weight Management/Obesity;Heart Failure;Lipids;Stress;Hypertension;Diabetes  Weight Management/Obesity;Heart Failure;Lipids;Stress;Hypertension;Diabetes      Review  Pt with multiple CAD RFs willing to participate in CR exercise.   Shilpa would like to increase her  independence and tone her body.   Exercise at cardiac rehab is currently on hold per recommended guidelines to prevent the spread of COVID-19      Expected Outcomes  Pt will continue to participate in CR exercise, nutrition, and lifestyle modification opportunities.   Pt will continue to participate in CR exercise, nutrition, and lifestyle modification opportunities when exercisse resumes         Core Components/Risk Factors/Patient Goals at Discharge (Final Review):  Goals and Risk Factor Review - 02/05/19 1113      Core Components/Risk Factors/Patient Goals Review   Personal Goals Review  Weight Management/Obesity;Heart Failure;Lipids;Stress;Hypertension;Diabetes    Review  Exercise at cardiac rehab is currently on hold per recommended guidelines to prevent the spread of COVID-19    Expected Outcomes  Pt will continue to participate in CR exercise, nutrition, and lifestyle modification opportunities when exercisse resumes       ITP Comments: ITP Comments    Row Name 01/15/19 1131 01/21/19 1038 02/05/19 1108       ITP Comments  Dr Fransico Him, MD, Medical Director  30 Day ITP Review.  Pt started exercise today and tolerated it well.   30 Day ITP Review. Irina is off to a good start to exercise. Exercise at cardiac rehab is currently on hold per recommended guidelines to prevent the spread of COVID-19        Comments: See ITP comments.Barnet Pall, RN,BSN 02/09/2019 2:36 PM

## 2019-02-09 NOTE — Telephone Encounter (Signed)
[id number] Contacted patient by phone and reviewed home exercise guidelines with patient including endpoints, temperature precautions, target heart rate and rate of perceived exertion. Pt is walking on treadmill at home or outside 30 minutes, Monday -Friday as her mode of home exercise. Pt voices understanding of instructions given. Sol Passer, MS, ACSM CEP

## 2019-02-09 NOTE — Telephone Encounter (Signed)
Called to notify patient that the cardiac and pulmonary rehabilitation department will be closed for 4 weeks due to COVID-19 restrictions. Pt verbalized understanding.  Aylana Hirschfeld M Lavonia Eager, MS, ACSM CEP  

## 2019-02-11 ENCOUNTER — Encounter (HOSPITAL_COMMUNITY): Payer: No Typology Code available for payment source

## 2019-02-11 ENCOUNTER — Ambulatory Visit (HOSPITAL_COMMUNITY): Payer: No Typology Code available for payment source

## 2019-02-11 MED FILL — glipiZIDE ER 10 MG TB24: 10 | 90 days supply | Qty: 180 | Fill #0

## 2019-02-11 MED FILL — SPIRONOLACTONE 25 MG TABS: 25 | 90 days supply | Qty: 90 | Fill #0

## 2019-02-12 ENCOUNTER — Telehealth (HOSPITAL_COMMUNITY): Payer: Self-pay

## 2019-02-12 NOTE — Telephone Encounter (Signed)
Phone call to pt for nutrition education and counseling for heart healthy diet. Left voicemail with contact information.   Laurina Bustle, MS, RD, LDN 02/12/2019 10:12 AM

## 2019-02-13 ENCOUNTER — Other Ambulatory Visit: Payer: Self-pay

## 2019-02-13 ENCOUNTER — Ambulatory Visit (HOSPITAL_COMMUNITY): Payer: No Typology Code available for payment source

## 2019-02-13 ENCOUNTER — Ambulatory Visit (HOSPITAL_COMMUNITY)
Admission: RE | Admit: 2019-02-13 | Discharge: 2019-02-13 | Disposition: A | Discharge status: Home / Self Care | Payer: No Typology Code available for payment source | Source: Ambulatory Visit | Attending: Cardiology | Admitting: Cardiology

## 2019-02-13 ENCOUNTER — Encounter (HOSPITAL_COMMUNITY): Payer: No Typology Code available for payment source

## 2019-02-13 DIAGNOSIS — I5022 Chronic systolic (congestive) heart failure: Secondary | ICD-10-CM | POA: Insufficient documentation

## 2019-02-13 LAB — BASIC METABOLIC PANEL
Anion gap: 13 (ref 5–15)
BUN: 17 mg/dL (ref 8–23)
CO2: 24 mmol/L (ref 22–32)
Calcium: 10.1 mg/dL (ref 8.9–10.3)
Chloride: 100 mmol/L (ref 98–111)
Creatinine, Ser: 0.87 mg/dL (ref 0.44–1.00)
GFR calc Af Amer: >60 mL/min (ref >60)
GFR calc non Af Amer: >60 mL/min (ref >60)
Glucose, Bld: 228 mg/dL — ABNORMAL HIGH (ref 70–99)
Potassium: 4.1 mmol/L (ref 3.5–5.1)
Sodium: 137 mmol/L (ref 135–145)

## 2019-02-16 ENCOUNTER — Ambulatory Visit (HOSPITAL_COMMUNITY): Payer: No Typology Code available for payment source

## 2019-02-16 ENCOUNTER — Encounter (HOSPITAL_COMMUNITY): Payer: No Typology Code available for payment source

## 2019-02-18 ENCOUNTER — Encounter (HOSPITAL_COMMUNITY): Payer: No Typology Code available for payment source

## 2019-02-18 ENCOUNTER — Ambulatory Visit (HOSPITAL_COMMUNITY): Payer: No Typology Code available for payment source

## 2019-02-19 MED FILL — SIMVASTATIN 20 MG TABLET: 20 | 90 days supply | Qty: 90 | Fill #0

## 2019-02-20 ENCOUNTER — Encounter (HOSPITAL_COMMUNITY): Payer: No Typology Code available for payment source

## 2019-02-20 ENCOUNTER — Ambulatory Visit (HOSPITAL_COMMUNITY): Payer: No Typology Code available for payment source

## 2019-02-20 ENCOUNTER — Telehealth (HOSPITAL_COMMUNITY): Payer: Self-pay

## 2019-02-20 NOTE — Telephone Encounter (Signed)
[employer] absence management sent with DM signature, confirmation received

## 2019-02-23 ENCOUNTER — Encounter (HOSPITAL_COMMUNITY): Payer: No Typology Code available for payment source

## 2019-02-23 ENCOUNTER — Ambulatory Visit (HOSPITAL_COMMUNITY): Payer: No Typology Code available for payment source

## 2019-02-25 ENCOUNTER — Telehealth (HOSPITAL_COMMUNITY): Payer: Self-pay | Admitting: *Deleted

## 2019-02-25 ENCOUNTER — Encounter (HOSPITAL_COMMUNITY): Payer: No Typology Code available for payment source

## 2019-02-25 ENCOUNTER — Ambulatory Visit (HOSPITAL_COMMUNITY): Payer: No Typology Code available for payment source

## 2019-02-25 NOTE — Telephone Encounter (Signed)
Called to notify patient that the cardiac and pulmonary rehabilitation department remains closed at this time due to COVID-19 restrictions. Message left on voicemail.   Sol Passer, MS, ACSM CEP  02/25/2019 0930

## 2019-02-27 ENCOUNTER — Ambulatory Visit (HOSPITAL_COMMUNITY): Payer: No Typology Code available for payment source

## 2019-02-27 ENCOUNTER — Encounter (HOSPITAL_COMMUNITY): Payer: No Typology Code available for payment source

## 2019-03-02 ENCOUNTER — Encounter (HOSPITAL_COMMUNITY): Payer: No Typology Code available for payment source

## 2019-03-02 ENCOUNTER — Ambulatory Visit (HOSPITAL_COMMUNITY): Payer: No Typology Code available for payment source

## 2019-03-04 ENCOUNTER — Encounter (HOSPITAL_COMMUNITY): Payer: No Typology Code available for payment source

## 2019-03-04 ENCOUNTER — Ambulatory Visit (HOSPITAL_COMMUNITY): Payer: No Typology Code available for payment source

## 2019-03-06 ENCOUNTER — Encounter (HOSPITAL_COMMUNITY): Payer: No Typology Code available for payment source

## 2019-03-06 ENCOUNTER — Other Ambulatory Visit (HOSPITAL_COMMUNITY): Payer: Self-pay | Admitting: Cardiology

## 2019-03-06 ENCOUNTER — Ambulatory Visit (HOSPITAL_COMMUNITY): Payer: No Typology Code available for payment source

## 2019-03-06 MED FILL — UNIFINE PENTIPS 32GX5/32": 32G X 4 MM | 90 days supply | Qty: 100 | Fill #0

## 2019-03-06 MED FILL — UNIFINE PENTIPS 32GX5/32: 32G X 4 MM | 90 days supply | Qty: 100 | Fill #0

## 2019-03-06 MED FILL — DIGOXIN 0.125 MG TABLET: 125 | 90 days supply | Qty: 90 | Fill #0

## 2019-03-09 ENCOUNTER — Ambulatory Visit (HOSPITAL_COMMUNITY): Payer: No Typology Code available for payment source

## 2019-03-09 ENCOUNTER — Encounter (HOSPITAL_COMMUNITY): Payer: No Typology Code available for payment source

## 2019-03-09 MED FILL — FUROSEMIDE 20 MG TABS: 20 | 30 days supply | Qty: 30 | Fill #0

## 2019-03-09 MED FILL — metFORMIN HCL 1000 MG TABS: 1000 | 90 days supply | Qty: 180 | Fill #0

## 2019-03-11 ENCOUNTER — Ambulatory Visit (HOSPITAL_COMMUNITY): Payer: No Typology Code available for payment source

## 2019-03-11 ENCOUNTER — Encounter (HOSPITAL_COMMUNITY): Payer: No Typology Code available for payment source

## 2019-03-13 ENCOUNTER — Encounter (HOSPITAL_COMMUNITY): Payer: No Typology Code available for payment source

## 2019-03-13 ENCOUNTER — Ambulatory Visit (HOSPITAL_COMMUNITY): Payer: No Typology Code available for payment source

## 2019-03-16 ENCOUNTER — Ambulatory Visit (HOSPITAL_COMMUNITY): Payer: No Typology Code available for payment source

## 2019-03-16 ENCOUNTER — Encounter (HOSPITAL_COMMUNITY): Payer: No Typology Code available for payment source

## 2019-03-16 MED FILL — VALSARTAN 80 MG TABLET: 80 | 30 days supply | Qty: 60 | Fill #0

## 2019-03-16 MED FILL — VICTOZA 18 MG/3 ML INJECT P: 18 | 90 days supply | Qty: 27 | Fill #0

## 2019-03-16 NOTE — Progress Notes (Signed)
Heart Failure TeleHealth Note  Due to national recommendations of social distancing due to [place] 19, Audio/video telehealth visit is felt to be most appropriate for this patient at this time.  See MyChart message from today for patient consent regarding telehealth for [name].  Date:  03/16/2019   ID:  Toni [name], DOB 08/18/1956, MRN [id number]  Location: Home  Provider location: [facility] [facility] Type of Visit: Established patient   PCP:  Donald Prose, MD  Cardiologist:  Sinclair Grooms, MD Primary HF: Dr Aundra Dubin   Chief Complaint: Heart Failure   History of Present Illness: Toni [name] is a 63 y.o. female with a history of  employeewithahistory of chronic systolic heart failure, due to nonischemic cardiomyopathy, HTN, DM,andhyperlipidemia.   Initially diagnosed with NICM in 1998 thought to be from HTN versus viral. Had cath in 1998 that was negative for coronary disease. EF at that time was 20% but EF recovered in 2012.   In October 2019, she hada cough/virus and she took OTC meds. Says she would feel better for a little while but then felt bad again. 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  She presented to [facility] ED on 11/13/18 with increased shortness of breath. She was admitted and echo was completed showing EF had gone back down to 15%. She had RHC/LHC with nonobstructive CAD and relatively preserved cardiac output. She was diuresed in the hospital and discharged. CPX in 2/20 showed only mild HF limitation.   She presents via Psychiatric nurse for a telehealth visit today. Overall feeling ok. Having ongoing fatigue. Denies PND/Orthopnea. Having occasional dizziness. Able walk 30-60 minutes 4-5 days a week. Appetite ok. No fever or chills. Weight at home 153-155 pounds. Taking all medications. Lives alone.   she denies  symptoms worrisome for COVID 19.   Past Medical History:  Diagnosis Date  . CHF (congestive heart failure) ([place])   . Diabetes mellitus without complication ([place])   . Hypertension    Past Surgical History:  Procedure Laterality Date  . ABDOMINAL HYSTERECTOMY    . CARDIAC CATHETERIZATION     in [facility], clean, per pt.  Marland Kitchen RIGHT/LEFT HEART CATH AND CORONARY ANGIOGRAPHY N/A 11/17/2018   Procedure: RIGHT/LEFT HEART CATH AND CORONARY ANGIOGRAPHY;  Surgeon: Belva Crome, MD;  Location: [facility];  Service: Cardiovascular;  Laterality: N/A;     Current Outpatient Medications  Medication Sig Dispense Refill  . aspirin 81 MG chewable tablet Chew 1 tablet (81 mg total) by mouth daily. 30 tablet 0  . carvedilol (COREG) 25 MG tablet Take 0.5 tablets (12.5 mg total) by mouth 2 (two) times daily with a meal. 30 tablet 0  . dapagliflozin propanediol (FARXIGA) 5 MG TABS tablet Take 5 mg by mouth daily.    . digoxin (LANOXIN) 0.125 MG tablet Take 1 tablet (0.125 mg total) by mouth daily. 90 tablet 3  . furosemide (LASIX) 20 MG tablet TAKE 1 TABLET (20 MG TOTAL) BY MOUTH DAILY. 30 tablet 0  . glipiZIDE (GLUCOTROL XL) 10 MG 24 hr tablet Take 10 mg by mouth 2 (two) times daily with a meal.     . Liraglutide (VICTOZA ) Inject 1.8 mg into the skin every evening.     . metFORMIN (GLUCOPHAGE) 1000 MG tablet Take 1,000 mg by mouth 2 (two) times daily with  a meal.    . Multiple Vitamins-Minerals (CENTRUM SILVER PO) Take 1 tablet by mouth daily. Reported on 02/10/2016    . simvastatin (ZOCOR) 20 MG tablet Take 20 mg by mouth every evening.    Marland Kitchen spironolactone (ALDACTONE) 25 MG tablet Take 1 tablet (25 mg total) by mouth every evening. 30 tablet 6  . TRUE METRIX BLOOD GLUCOSE TEST test strip Use as directed.  5  . TRUEPLUS LANCETS 30G MISC Use as directed.  5  . valsartan (DIOVAN) 80 MG tablet Take 1 tablet (80 mg total) by mouth 2 (two) times daily. 60 tablet 6   No current  facility-administered medications for this visit.     Allergies:   Biaxin [clarithromycin]; Enalapril maleate; and Vasotec [enalapril]   Social History:  The patient  reports that she has never smoked. She has never used smokeless tobacco. She reports current alcohol use. She reports that she does not use drugs.   Family History:  The patient's family history includes Benign prostatic hyperplasia in her brother; Diabetes Mellitus I in her brother, father, and mother; Heart failure in her paternal uncle; Hypertension in her brother and brother; Lung cancer in her mother; Sudden death (age of onset: 61) in her father.   ROS:  Please see the history of present illness.   All other systems are personally reviewed and negative.   Exam:  Tele Health Call; Exam is subjective General:  Speaks in full sentences. No resp difficulty. Lungs: Normal respiratory effort with conversation.  Abdomen: Non-distended per patient report Extremities: Pt denies edema. Neuro: Alert & oriented x 3.   Recent Labs: 11/14/2018: B Natriuretic Peptide 520.0 11/15/2018: TSH 0.637 11/18/2018: ALT 19; Hemoglobin 13.5; Platelets 240 11/19/2018: Magnesium 1.9 02/13/2019: BUN 17; Creatinine, Ser 0.87; Potassium 4.1; Sodium 137  Personally reviewed   Wt Readings from Last 3 Encounters:  02/03/19 73.1 kg (161 lb 3.2 oz)  01/15/19 72.7 kg (160 lb 4.4 oz)  12/19/18 73.1 kg (161 lb 3.2 oz)      ASSESSMENT AND PLAN:  1. Chronic Systolic Heart Failure: Nonischemic cardiomyopathy by 12/19 cath.  Cardiac MRI with LV EF 14%, RV EF 17%. No definite evidence for myocarditis or infiltrative disease by delayed enhancement images.  Most likely cause of cardiomyopathy is familial versus prior viral myocarditis.  Brother also had a cardiomyopathy of uncertain etiology.  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  CPX in 2/20 showed only mild HF limitation.  NYHA II-III.Volume status sound stable.   Continue coreg 25 mg twice a day.  -Continue valsartan 80 mg twice a day. No entresto angioedema.  - Continue dapaglifloxin.  - Continue spironolactone 25 mg daily.  -Repeat ECHO in June.    2. Diabetes:  Per PCP   COVID screen The patient does not have any symptoms that suggest any further testing/ screening at this time.  Social distancing reinforced today.  Patient Risk: After full review of this patients clinical status, I feel that they are at moderate risk for cardiac decompensation at this time.  Relevant cardiac medications were reviewed at length with the patient today. The patient does not have concerns regarding their medications at this time.   The following changes were made today:  Follow up  In June with ECHO  Recommended follow-up:  Follow up in June with Dr Aundra Dubin. Plan to stay out of work until June. We have provided a letter for work.   Today, I have spent 21 minutes  with the patient with telehealth technology discussing the above issues .    Jeanmarie Hubert, NP  03/16/2019 4:04 PM  [place] [address] [facility] [place] [address] [phone] (office) [phone] (fax)

## 2019-03-17 ENCOUNTER — Other Ambulatory Visit: Payer: Self-pay

## 2019-03-17 ENCOUNTER — Ambulatory Visit (HOSPITAL_COMMUNITY)
Admission: RE | Admit: 2019-03-17 | Discharge: 2019-03-17 | Disposition: A | Discharge status: Home / Self Care | Payer: No Typology Code available for payment source | Source: Ambulatory Visit | Attending: Internal Medicine | Admitting: Internal Medicine

## 2019-03-17 ENCOUNTER — Encounter (HOSPITAL_COMMUNITY): Payer: Self-pay | Admitting: *Deleted

## 2019-03-17 VITALS — Wt 152.0 lb

## 2019-03-17 DIAGNOSIS — I5022 Chronic systolic (congestive) heart failure: Secondary | ICD-10-CM | POA: Diagnosis not present

## 2019-03-17 NOTE — Patient Instructions (Signed)
Please continue current medications.  Please keep follow up appointment as scheduled:   Tue 04/28/2019 9:00 am echocardiogram    10:00 am Dr Aundra Dubin   Parking Garage Code:  [phone]

## 2019-03-17 NOTE — Progress Notes (Signed)
Spoke w/pt via phone, she is aware letter was completed and signed by Darrick Grinder, NP to keep her out of work until next appt on 6/9, she request letter be faxed to Maralyn Sago at [phone], letter faxed.  AVS mailed to pt.

## 2019-03-18 ENCOUNTER — Ambulatory Visit (HOSPITAL_COMMUNITY): Payer: No Typology Code available for payment source

## 2019-03-18 ENCOUNTER — Encounter (HOSPITAL_COMMUNITY): Payer: No Typology Code available for payment source

## 2019-03-20 ENCOUNTER — Encounter (HOSPITAL_COMMUNITY): Payer: No Typology Code available for payment source

## 2019-03-20 ENCOUNTER — Ambulatory Visit (HOSPITAL_COMMUNITY): Payer: No Typology Code available for payment source

## 2019-03-23 ENCOUNTER — Ambulatory Visit (HOSPITAL_COMMUNITY): Payer: No Typology Code available for payment source

## 2019-03-23 ENCOUNTER — Encounter (HOSPITAL_COMMUNITY): Payer: No Typology Code available for payment source

## 2019-03-25 ENCOUNTER — Ambulatory Visit (HOSPITAL_COMMUNITY): Payer: No Typology Code available for payment source

## 2019-03-25 ENCOUNTER — Encounter (HOSPITAL_COMMUNITY): Payer: No Typology Code available for payment source

## 2019-03-27 ENCOUNTER — Encounter (HOSPITAL_COMMUNITY): Payer: No Typology Code available for payment source

## 2019-03-27 ENCOUNTER — Ambulatory Visit (HOSPITAL_COMMUNITY): Payer: No Typology Code available for payment source

## 2019-03-30 ENCOUNTER — Ambulatory Visit (HOSPITAL_COMMUNITY): Payer: No Typology Code available for payment source

## 2019-03-30 ENCOUNTER — Encounter (HOSPITAL_COMMUNITY): Payer: No Typology Code available for payment source

## 2019-04-01 ENCOUNTER — Ambulatory Visit (HOSPITAL_COMMUNITY): Payer: No Typology Code available for payment source

## 2019-04-01 ENCOUNTER — Encounter (HOSPITAL_COMMUNITY): Payer: No Typology Code available for payment source

## 2019-04-02 ENCOUNTER — Ambulatory Visit: Payer: No Typology Code available for payment source | Admitting: Genetic Counselor

## 2019-04-02 ENCOUNTER — Other Ambulatory Visit: Payer: Self-pay

## 2019-04-03 ENCOUNTER — Encounter (HOSPITAL_COMMUNITY): Payer: No Typology Code available for payment source

## 2019-04-03 ENCOUNTER — Ambulatory Visit (HOSPITAL_COMMUNITY): Payer: No Typology Code available for payment source

## 2019-04-06 ENCOUNTER — Ambulatory Visit (HOSPITAL_COMMUNITY): Payer: No Typology Code available for payment source

## 2019-04-06 ENCOUNTER — Encounter (HOSPITAL_COMMUNITY): Payer: No Typology Code available for payment source

## 2019-04-06 ENCOUNTER — Other Ambulatory Visit (HOSPITAL_COMMUNITY): Payer: Self-pay | Admitting: Cardiology

## 2019-04-06 MED FILL — LINZESS 145 MCG CAPSULE: 145 | 30 days supply | Qty: 30 | Fill #0

## 2019-04-06 MED FILL — FARXIGA 10 MG TABLET: 10 | 90 days supply | Qty: 90 | Fill #0

## 2019-04-07 MED FILL — FUROSEMIDE 20 MG TABS: 20 | 30 days supply | Qty: 30 | Fill #0

## 2019-04-08 ENCOUNTER — Encounter (HOSPITAL_COMMUNITY): Payer: No Typology Code available for payment source

## 2019-04-08 ENCOUNTER — Ambulatory Visit (HOSPITAL_COMMUNITY): Payer: No Typology Code available for payment source

## 2019-04-10 ENCOUNTER — Ambulatory Visit (HOSPITAL_COMMUNITY): Payer: No Typology Code available for payment source

## 2019-04-10 ENCOUNTER — Encounter (HOSPITAL_COMMUNITY): Payer: No Typology Code available for payment source

## 2019-04-14 ENCOUNTER — Telehealth (HOSPITAL_COMMUNITY): Payer: Self-pay

## 2019-04-14 ENCOUNTER — Telehealth (HOSPITAL_COMMUNITY): Payer: Self-pay | Admitting: *Deleted

## 2019-04-14 MED ORDER — LOSARTAN POTASSIUM 25 MG PO TABS: 25.0000 mg | ORAL_TABLET | Freq: Two times a day (BID) | ORAL | 5 refills | Status: DC

## 2019-04-14 MED FILL — LOSARTAN POTASSIUM 25 MG TA: 25 | 30 days supply | Qty: 60 | Fill #0

## 2019-04-14 NOTE — Progress Notes (Signed)
Post-test GC notes   Due to the COVID-19 pandemic, patient consents to having a virtual genetic consult and for the visit details to be documented in her notes. Patient identity was confirmed using two unique identifiers of full name and date of birth   Referral Reason  Toni [name] was referred by Dr. Aundra Dubin to discuss the implications of her genetic test report.  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 I explained to her that it is considered a VUS as there is no consensus on its effect on the protein and hence disease. While this variant is highly conserved in evolution and present at a very low frequency in the general population, computational algorithms provide conflicting reports on its pathogenicity. In addition, pathogenic variants in LMNA gene are associated with cardiac conduction defects in dilated cardiomyopathy. I informed her that in the absence of functional studies to determine the pathogenicity of this variant, it may be likely that she has a rare benign variant that is not causal to her heart condition. She verbalized understanding of this.   Her medical and 4-generation family history was obtained. See details below.  Traditional Risk Factors Toni [name] denies having other cardiac or systemic conditions that can cause non-ischemic cardiomyopathy, namely myocarditis, ischemic heart disease, infiltrative myocardial disease (amyloidosis, sarcoidosis, hemochromatosis), hypertension, infection with HIV virus, connective tissue disease (such as systemic lupus erythematosus etc.), substance abuse (chronic alcohol abuse, cocaine abuse), doxorubicin therapy and other cardiovascular diseases (valvular heart disease, HCM).   She mentions having a viral infection at age 41 that reportedly lead to decreased heart function of 13%. See details below-  Toni [name] (III.2 on pedigree) is a pleasant 63 year old Caucasian lady who works as a Radio broadcast assistant at [employer].  She informs me that in November 2019 she had a cold with fever and shortness of breath. She was out of work for a week and recollects feeling better but having dyspnea. She went shopping at Thanksgiving and had to rest multiple times to catch her breath.  She reports an incident on the last day of her work before Christmas break in 2019 where she felt out of breath and dizzy as she walked across the parking lot.  She says that she sat down to rest for 15 minutes and then went to the after hours clinic. An X ray was performed that rules out bronchitis. However, she was found t have an enlarged heart with fluid in her lungs. She was hospitalized for 6 days and treated for fluid accumulation.   She reports being told of having an enlarged heart at age 65. She states that at that time, she had a cold and took OTC medication to relieve her symptoms. However this did not help her and she was out of work for a week. Upon going back to work, she states having shortness of breath. She was referred to a cardiologist and a 2D Echo was performed that showed an EF of 13%. She underwent cardiac rehab and stayed home for 3 years during which time her EF improved to 40%. She resumed work and had been fine until she had a cold in November 2019. She also reports being asymptomatic prior to her diagnosis of enlarged heart at age 68. She states being told that the viral infection had reduced her heart function to 13%.   Family history Toni [name] (III.2) is  the middle child. She does not have children. She has an older brother (III.1) who was hospitalized with CHF at age 32 while in the [employer]. She states that his EF was at 30% 2 years ago. He has two healthy daughters (IV.1-IV.2). Her younger brother (III.3) is diabetic but does not have heart disease. His children (IV.3-IV.4) are also in good health.   There is no history of heart disease in her maternal lineage. Her mother (II.8) died of lung cancer at age 5. Her siblings (II.9-II.45) died of old age as, did her mother (I.4). She is not aware of the cause of death in her maternal grandfather (I.3).  Toni [name]'s father (II.7) died suddenly at age 10. He was found dead at home by her brother when he could not get in touch with him and went to his house to check on him. He did not have a prior heart condition or other concerning symptoms. She is not sure if an autopsy was performed. He has one living sister (II.6), age 36. He had three other sisters (II.3-II.5) that died of old age, a brother (II.2) that died of brown lung disease and another brother (II.1) that died in his 62s or 57s of a suspected heart attack. He does not know the details of his death. Both paternal grandparents (I.1, I.2) died of old age.  Impression  In summary, Toni [name] presents with reduced heart function of 13% at an early age (77). Additionally, she reports sudden death at age 50 in her father and congestive heart failure at age 58 in her brother. 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 However her clinical presentation was suspected to be from a viral infection. She was symptomatic, in both instances, soon after she suffered from a cold and fever.   Genetic testing was performed that yielded a variant of unknown significance in the LMNA gene, a gene that is associated with cardiac conduction defects in DCM. In light of her presentation in the presence of previous confounding viral infections, it is likely that the LMNA variant may be a rare benign variant and is not causal to her condition. As such, stratifying risk of NICM in her family with the LMNA variant is not justified unless in vitro and in vivo functional studies confirm pathogenicity. Toni [name] verbalized understanding of this.   Please note that the patient has not been counseled in this  visit on personal, cultural or ethical issues that she may face due to her heart condition.    Lattie Corns, Ph.D, [facility] Clinical Molecular Geneticist

## 2019-04-14 NOTE — Telephone Encounter (Signed)
Pt informed of change.  Pt states understanding.  New Rx sent to pharmacy. Pt will complete current rx for valsartan and start losartan 25mg  BID after. Verbalized understanding

## 2019-04-14 NOTE — Telephone Encounter (Signed)
She can take losartan 25 mg bid.

## 2019-04-14 NOTE — Telephone Encounter (Signed)
Received fax from [facility] stating valsartan is on manufacture long term back order, they do have irbesartan and olmesartan in stock.  Please advise on which medicine and dose to switch if appropriate.

## 2019-04-15 ENCOUNTER — Ambulatory Visit (HOSPITAL_COMMUNITY): Payer: No Typology Code available for payment source

## 2019-04-15 ENCOUNTER — Encounter (HOSPITAL_COMMUNITY): Payer: No Typology Code available for payment source

## 2019-04-15 ENCOUNTER — Encounter (HOSPITAL_COMMUNITY): Payer: Self-pay

## 2019-04-15 NOTE — Progress Notes (Signed)
Transitioning to virtual cardiac rehab ° °Dr.  McLean ° ° As you are aware our department remains closed to patients due to Covid-19.  We are excited to be able to offer an alternative to traditional onsite Cardiac Rehab while your patient continues to follow Re-Open guidelines.  This is a notification that your patient has been contacted and is very interested in participating in Virtual Cardiac Rehab. ° °Thank you for your continued support in helping us meet the health care needs of our patients. ° °Gloria W. Support Rep II ° ° °Cardiac Rehab staff °

## 2019-04-17 ENCOUNTER — Ambulatory Visit (HOSPITAL_COMMUNITY): Payer: No Typology Code available for payment source

## 2019-04-17 ENCOUNTER — Encounter (HOSPITAL_COMMUNITY): Payer: No Typology Code available for payment source

## 2019-04-20 ENCOUNTER — Telehealth (HOSPITAL_COMMUNITY): Payer: Self-pay

## 2019-04-20 ENCOUNTER — Ambulatory Visit (HOSPITAL_COMMUNITY): Payer: No Typology Code available for payment source

## 2019-04-20 ENCOUNTER — Encounter (HOSPITAL_COMMUNITY): Payer: No Typology Code available for payment source

## 2019-04-20 NOTE — Telephone Encounter (Signed)
Attempted to contact pt to schedule a telephone visit for our Virtual Cardiac Rehab, LMTCB. °

## 2019-04-22 ENCOUNTER — Encounter (HOSPITAL_COMMUNITY): Payer: No Typology Code available for payment source

## 2019-04-22 ENCOUNTER — Ambulatory Visit (HOSPITAL_COMMUNITY): Payer: No Typology Code available for payment source

## 2019-04-24 ENCOUNTER — Encounter (HOSPITAL_COMMUNITY): Payer: No Typology Code available for payment source

## 2019-04-24 ENCOUNTER — Ambulatory Visit (HOSPITAL_COMMUNITY): Payer: No Typology Code available for payment source

## 2019-04-28 ENCOUNTER — Other Ambulatory Visit: Payer: Self-pay

## 2019-04-28 ENCOUNTER — Ambulatory Visit (HOSPITAL_BASED_OUTPATIENT_CLINIC_OR_DEPARTMENT_OTHER)
Admission: RE | Admit: 2019-04-28 | Discharge: 2019-04-28 | Disposition: A | Discharge status: Home / Self Care | Payer: No Typology Code available for payment source | Source: Ambulatory Visit | Attending: Cardiology | Admitting: Cardiology

## 2019-04-28 ENCOUNTER — Encounter (HOSPITAL_COMMUNITY): Payer: Self-pay | Admitting: Cardiology

## 2019-04-28 ENCOUNTER — Ambulatory Visit (HOSPITAL_COMMUNITY)
Admission: RE | Admit: 2019-04-28 | Discharge: 2019-04-28 | Disposition: A | Discharge status: Home / Self Care | Payer: No Typology Code available for payment source | Source: Ambulatory Visit | Attending: Family Medicine | Admitting: Family Medicine

## 2019-04-28 VITALS — BP 142/98 | HR 84 | Wt 151.6 lb

## 2019-04-28 DIAGNOSIS — Z833 Family history of diabetes mellitus: Secondary | ICD-10-CM | POA: Diagnosis not present

## 2019-04-28 DIAGNOSIS — Z79899 Other long term (current) drug therapy: Secondary | ICD-10-CM | POA: Diagnosis not present

## 2019-04-28 DIAGNOSIS — I110 Hypertensive heart disease with heart failure: Secondary | ICD-10-CM | POA: Diagnosis not present

## 2019-04-28 DIAGNOSIS — E785 Hyperlipidemia, unspecified: Secondary | ICD-10-CM | POA: Diagnosis not present

## 2019-04-28 DIAGNOSIS — Z7982 Long term (current) use of aspirin: Secondary | ICD-10-CM | POA: Diagnosis not present

## 2019-04-28 DIAGNOSIS — I5023 Acute on chronic systolic (congestive) heart failure: Secondary | ICD-10-CM

## 2019-04-28 DIAGNOSIS — M7502 Adhesive capsulitis of left shoulder: Secondary | ICD-10-CM

## 2019-04-28 DIAGNOSIS — Z8249 Family history of ischemic heart disease and other diseases of the circulatory system: Secondary | ICD-10-CM | POA: Insufficient documentation

## 2019-04-28 DIAGNOSIS — I5022 Chronic systolic (congestive) heart failure: Secondary | ICD-10-CM | POA: Diagnosis not present

## 2019-04-28 DIAGNOSIS — I428 Other cardiomyopathies: Secondary | ICD-10-CM | POA: Insufficient documentation

## 2019-04-28 DIAGNOSIS — E119 Type 2 diabetes mellitus without complications: Secondary | ICD-10-CM | POA: Insufficient documentation

## 2019-04-28 DIAGNOSIS — I2510 Atherosclerotic heart disease of native coronary artery without angina pectoris: Secondary | ICD-10-CM | POA: Diagnosis not present

## 2019-04-28 DIAGNOSIS — Z7984 Long term (current) use of oral hypoglycemic drugs: Secondary | ICD-10-CM | POA: Diagnosis not present

## 2019-04-28 DIAGNOSIS — Z801 Family history of malignant neoplasm of trachea, bronchus and lung: Secondary | ICD-10-CM | POA: Insufficient documentation

## 2019-04-28 LAB — BASIC METABOLIC PANEL
Anion gap: 11 (ref 5–15)
BUN: 15 mg/dL (ref 8–23)
CO2: 24 mmol/L (ref 22–32)
Calcium: 10.1 mg/dL (ref 8.9–10.3)
Chloride: 102 mmol/L (ref 98–111)
Creatinine, Ser: 0.62 mg/dL (ref 0.44–1.00)
GFR calc Af Amer: >60 mL/min (ref >60)
GFR calc non Af Amer: >60 mL/min (ref >60)
Glucose, Bld: 185 mg/dL — ABNORMAL HIGH (ref 70–99)
Potassium: 4.4 mmol/L (ref 3.5–5.1)
Sodium: 137 mmol/L (ref 135–145)

## 2019-04-28 LAB — DIGOXIN LEVEL: Digoxin Level: 0.9 ng/mL (ref 0.8–2.0)

## 2019-04-28 MED ORDER — ISOSORB DINITRATE-HYDRALAZINE 20-37.5 MG PO TABS: 1.0000 | ORAL_TABLET | Freq: Three times a day (TID) | ORAL | 6 refills | Status: DC

## 2019-04-28 MED FILL — BIDIL TABLET: 20-37.5 | 30 days supply | Qty: 90 | Fill #0

## 2019-04-28 NOTE — Progress Notes (Signed)
  Echocardiogram 2D Echocardiogram has been performed.  Terran Klinke L Androw 04/28/2019, 9:45 AM

## 2019-04-28 NOTE — Patient Instructions (Addendum)
You have been referred to [facility]. You will receive a call to schedule an appointment.  START BIDIL 1 tab three times a day  EKG done in office today  Labs today We will only contact you if something comes back abnormal or we need to make some changes. Otherwise no news is good news!  You have been referred to Electrophysiology, you will receive a call to schedule this appointment.  Your physician recommends that you schedule a follow-up appointment in: 2 month with Dr Aundra Dubin.

## 2019-04-29 NOTE — Progress Notes (Signed)
PCP: Dr. Nancy Fetter Cardiology: Dr. Tamala Julian HF Cardiology: Dr. Aundra Dubin  Toni [name] is a 63 y.o. [employer] employee with a history of chronic systolic heart failure, due to nonischemic cardiomyopathy, HTN, DM, and hyperlipidemia.   Initially diagnosed with NICM in 1998 thought to be from HTN versus viral. Had cath in 1998 that was negative for coronary disease. EF at that time was 20% but EF recovered in 2012.   In October 2019, she had a cough/virus and she took OTC meds. Says she would feel better for a little while but then felt bad again. 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  She presented to [facility] ED on 11/13/18 with increased shortness of breath. She was admitted and echo was completed showing EF had gone back down to 15%. She had RHC/LHC with nonobstructive CAD and relatively preserved cardiac output. She was diuresed in the hospital and discharged. CPX in 2/20 showed only mild HF limitation.   Given family history of cardiomyopathy, I sent genetic testing.  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  I had her see Dr. Broadus John, we think that the variant may be benign and unrelated to her cardiomyopathy.  She has no history of conduction disturbance.   Echo was done today, EF remains low at 25-30%, mild LV dilation, mildly decreased RV systolic function.   She returns today for followup of CHF.  No significant dyspnea with her ADLs. She walks on the treadmill for 20 minutes - 1 hour with no problem. No chest pain.  No orthopnea/PND.  Overall, breathing has been better and weight is down 10 lbs.  Main complaint is lack of mobility of left shoulder, has been told that she has a frozen shoulder.   Labs (1/20): K 4, creatinine 0.67 => 0.74 Labs (3/20): K 4.1, creatinine 0.87  ECG (personally reviewed): NSR, LVH with repolarization abnormality.   PMH: 1. HTN 2. Type 2  diabetes 3. Hyperlipidemia 4. Chronic systolic CHF: Nonischemic cardiomyopathy.  Diagnosed in 1998, EF 20% by echo at that time.  Echo back to normal range by 2012.   - LHC/RHC (12/19): D1 60-70% stenosis; mean RA 6, PA 58/22, mean PCWP 22, CI 2.9.  - Echo (12/19): EF 15% with severe LV dilation, moderate central MR likely functional.  - Cardiac MRI (12/19): Moderate LV dilation with EF 14%, mild RV dilation with EF 17%, LGE at the inferior RV insertion site (nonspecific).  - CPX (2/20): peak VO2 18.6, VE/VCO2 31, RER 1.18 => mild HF limitation.  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  Suspect the variant is benign.  - Echo (6/20): EF 25-30%, mild LV dilation, mildly decreased RV systolic function.  5. Angioedema with ACEI  Social History   Socioeconomic History  . Marital status: Single    Spouse name: Not on file  . Number of children: Not on file  . Years of education: 72  . Highest education level: Bachelor's degree (e.g., BA, AB, BS)  Occupational History  . Occupation: Surveyor, quantity: [employer]  . Financial resource strain: Not very hard  . Food insecurity:    Worry: Never true    Inability: Never true  . Transportation needs:    Medical: No    Non-medical: No  Tobacco Use  . Smoking status:  Never Smoker  . Smokeless tobacco: Never Used  Substance and Sexual Activity  . Alcohol use: Yes    Alcohol/week: 0.0 standard drinks    Comment: less than once a month  . Drug use: No  . Sexual activity: Never  Lifestyle  . Physical activity:    Days per week: 0 days    Minutes per session: 0 min  . Stress: To some extent  Relationships  . Social connections:    Talks on phone: Not on file    Gets together: Not on file    Attends religious service: Not on file    Active member of club or organization: Not on file    Attends meetings of clubs or organizations: Not on file     Relationship status: Not on file  . Intimate partner violence:    Fear of current or ex partner: Not on file    Emotionally abused: Not on file    Physically abused: Not on file    Forced sexual activity: Not on file  Other Topics Concern  . Not on file  Social History Narrative   Works at [employer].  Lives alone.     Family History  Problem Relation Age of Onset  . Diabetes Mellitus I Mother   . Lung cancer Mother   . Diabetes Mellitus I Father   . Sudden death Father 58  . Hypertension Brother   . Hypertension Brother   . Diabetes Mellitus I Brother   . Benign prostatic hyperplasia Brother   . Heart failure Paternal Uncle    ROS: All systems reviewed and negative except as per HPI.   Current Outpatient Medications  Medication Sig Dispense Refill  . aspirin 81 MG chewable tablet Chew 1 tablet (81 mg total) by mouth daily. 30 tablet 0  . carvedilol (COREG) 25 MG tablet Take 0.5 tablets (12.5 mg total) by mouth 2 (two) times daily with a meal. 30 tablet 0  . dapagliflozin propanediol (FARXIGA) 5 MG TABS tablet Take 5 mg by mouth daily.    . digoxin (LANOXIN) 0.125 MG tablet Take 1 tablet (0.125 mg total) by mouth daily. 90 tablet 3  . furosemide (LASIX) 20 MG tablet TAKE 1 TABLET (20 MG TOTAL) BY MOUTH DAILY. 30 tablet 6  . glipiZIDE (GLUCOTROL XL) 10 MG 24 hr tablet Take 10 mg by mouth 2 (two) times daily with a meal.     . LINZESS 145 MCG CAPS capsule Take 145 mcg by mouth daily.    . Liraglutide (VICTOZA [place]) Inject 1.8 mg into the skin every evening.     Marland Kitchen losartan (COZAAR) 25 MG tablet Take 1 tablet (25 mg total) by mouth 2 (two) times a day. 60 tablet 5  . metFORMIN (GLUCOPHAGE) 1000 MG tablet Take 1,000 mg by mouth 2 (two) times daily with a meal.    . Multiple Vitamins-Minerals (CENTRUM SILVER PO) Take 1 tablet by mouth daily. Reported on 02/10/2016    . simvastatin (ZOCOR) 20 MG tablet Take 20 mg by mouth every evening.    Marland Kitchen spironolactone (ALDACTONE) 25 MG tablet Take 1  tablet (25 mg total) by mouth every evening. 30 tablet 6  . TRUE METRIX BLOOD GLUCOSE TEST test strip Use as directed.  5  . TRUEPLUS LANCETS 30G MISC Use as directed.  5  . UNIFINE PENTIPS 32G X 4 MM MISC     . isosorbide-hydrALAZINE (BIDIL) 20-37.5 MG tablet Take 1 tablet by mouth 3 (three) times daily. 90 tablet  6   No current facility-administered medications for this encounter.    BP (!) 142/98   Pulse 84   Wt 68.8 kg (151 lb 9.6 oz)   SpO2 97%   BMI 24.84 kg/m  General: NAD Neck: No JVD, no thyromegaly or thyroid nodule.  Lungs: Clear to auscultation bilaterally with normal respiratory effort. CV: Nondisplaced PMI.  Heart regular S1/S2, no S3/S4, no murmur.  No peripheral edema.  No carotid bruit.  Normal pedal pulses.  Abdomen: Soft, nontender, no hepatosplenomegaly, no distention.  Skin: Intact without lesions or rashes.  Neurologic: Alert and oriented x 3.  Psych: Normal affect. Extremities: No clubbing or cyanosis.  HEENT: Normal.   Assessment/Plan: 1. Chronic systolic CHF: Nonischemic cardiomyopathy by 12/19 cath.  Cardiac MRI with LV EF 14%, RV EF 17%. No definite evidence for myocarditis or infiltrative disease by delayed enhancement images.  Most likely cause of cardiomyopathy is familial versus prior viral myocarditis.  Brother also had a cardiomyopathy of uncertain etiology.  Genetic testing was done, showing an LMNA gene variant of uncertain significance => she saw Dr. Broadus John, suspect benign/uninvolved variant (no conduction abnormality).  CPX in 2/20 showed only mild HF limitation.  Echo in 6/20 showed EF 25-30%. On exam, she is not volume overloaded.  NYHA class II symptoms.  Narrow QRS, not CRT candidate.  - Continue Coreg 25 mg bid.  - Continue losartan 25 mg bid.  She cannot take Entresto with history of ACEI angioedema. BMET today.  - Continue dapagliflozin.  - Continue Lasix 20 mg daily.  - Continue spironolactone 25 mg daily.  - Continue digoxin, check level  today.  - Add Bidil 1/2 tab tid.  - She has a nonischemic cardiomyopathy but is relatively young with persistently low EF, so I will refer her for EP evaluation for ICD.  2. Type II diabetes: She is on dapagliflozin.  3. 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  I will refer for orthopedic evaluation.   Followup in 2 months.   Loralie Champagne 04/29/2019

## 2019-05-01 ENCOUNTER — Telehealth (HOSPITAL_COMMUNITY): Payer: Self-pay

## 2019-05-01 NOTE — Telephone Encounter (Signed)
disability (matrix) paperwork complete. Called and left voicemail for pt to come and pick up the paper work from the front desk in the heart and vascular center.

## 2019-05-04 MED FILL — FUROSEMIDE 20 MG TABS: 20 | 30 days supply | Qty: 30 | Fill #0

## 2019-05-05 ENCOUNTER — Ambulatory Visit (INDEPENDENT_AMBULATORY_CARE_PROVIDER_SITE_OTHER): Payer: No Typology Code available for payment source

## 2019-05-05 ENCOUNTER — Ambulatory Visit (INDEPENDENT_AMBULATORY_CARE_PROVIDER_SITE_OTHER): Payer: No Typology Code available for payment source | Admitting: Orthopaedic Surgery

## 2019-05-05 ENCOUNTER — Other Ambulatory Visit: Payer: Self-pay

## 2019-05-05 ENCOUNTER — Encounter: Payer: Self-pay | Admitting: Orthopaedic Surgery

## 2019-05-05 VITALS — BP 134/87 | HR 86 | Ht 65.5 in | Wt 151.0 lb

## 2019-05-05 DIAGNOSIS — M25512 Pain in left shoulder: Secondary | ICD-10-CM

## 2019-05-05 DIAGNOSIS — G8929 Other chronic pain: Secondary | ICD-10-CM

## 2019-05-05 NOTE — Progress Notes (Addendum)
Office Visit Note   Patient: Toni [name]           Date of Birth: December 10, 1955           MRN: [id number] Visit Date: 05/05/2019              Requested by: Larey Dresser, MD [phone] [place],  [place] [address] PCP: Donald Prose, MD   Assessment & Plan: Visit Diagnoses:  1. Chronic left shoulder pain     Plan: Adhesive capsulitis left shoulder.  This could possibly could be related to her diabetes without a history of injury or trauma.  We will try a course of physical therapy.  Avoid cortisone as she is diabetic with difficulty controlling her sugars.  Also consider closed manipulation if no improvement.  Office 1 month Follow-Up Instructions: Return in about 1 month (around 06/04/2019).   Orders:  Orders Placed This Encounter  Procedures  . XR Shoulder Left   No orders of the defined types were placed in this encounter.     Procedures: No procedures performed   Clinical Data: No additional findings.   Subjective: Chief Complaint  Patient presents with  . Left Shoulder - Pain  Paitent presents today for her let shoulder. She said that it has hurt for 2 months and limited range of motion. No known injury. She has pain that radiates down her arm. The pain is worse at night and wakes her at night. She has been using Doterra oils and that seems to help some. She is right hand dominant. No previous shoulder surgery.  Has a history of diabetes with recent poor control of her blood sugars.  Also has a history of congestive heart failure and is in cardiac rehab  HPI  Review of Systems  Constitutional: Positive for fatigue.  HENT: Negative for ear pain.   Eyes: Negative for pain.  Respiratory: Positive for shortness of breath.   Cardiovascular: Negative for leg swelling.  Gastrointestinal: Positive for constipation. Negative for diarrhea.  Endocrine: Negative for cold intolerance and heat intolerance.  Genitourinary: Negative for difficulty urinating.   Musculoskeletal: Negative for joint swelling.  Skin: Negative for rash.  Allergic/Immunologic: Negative for food allergies.  Neurological: Negative for weakness.  Hematological: Does not bruise/bleed easily.  Psychiatric/Behavioral: Positive for sleep disturbance.     Objective: Vital Signs: BP 134/87   Pulse 86   Ht 5' 5.5" (1.664 m)   Wt 151 lb (68.5 kg)   BMI 24.75 kg/m   Physical Exam Constitutional:      Appearance: She is well-developed.  Eyes:     Pupils: Pupils are equal, round, and reactive to light.  Pulmonary:     Effort: Pulmonary effort is normal.  Skin:    General: Skin is warm and dry.  Neurological:     Mental Status: She is alert and oriented to person, place, and time.  Psychiatric:        Behavior: Behavior normal.     Ortho Exam awake alert and oriented x3.  Comfortable sitting.  Has evidence of adhesive capsulitis lacking the last 40 degrees of overhead motion.  About 70 degrees of abduction.  Considerable positive impingement with only about 15 degrees of external rotation.  Good grip and good release.  Skin intact.  No localized areas of tenderness with the arm at her side.  No related neck pain  Specialty Comments:  No specialty comments available.  Imaging: Xr Shoulder Left  Result Date:  05/05/2019 Films of the left shoulder obtained in several projections.  No ectopic calcification.  Humeral head is centered about the glenoid.  Normal space between the humeral head and acromion.  Type II acromion.  Mild degenerative changes the chromic clavicular joint    PMFS History: Patient Active Problem List   Diagnosis Date Noted  . Hypomagnesemia 11/18/2018  . Hypokalemia 11/15/2018  . Diabetes mellitus without complication ([place])   . Coronary artery disease   . Acute on chronic systolic CHF (congestive heart failure) ([place]) 01/04/2016  . Hyperlipidemia 01/03/2016  . Cardiomegaly - hypertensive 01/03/2016  . DM (diabetes mellitus), type 2 with  complications ([place]) 85/12/7739  . Hypertension 01/03/2014   Past Medical History:  Diagnosis Date  . CHF (congestive heart failure) ([place])   . Diabetes mellitus without complication ([place])   . Hypertension     Family History  Problem Relation Age of Onset  . Diabetes Mellitus I Mother   . Lung cancer Mother   . Diabetes Mellitus I Father   . Sudden death Father 21  . Hypertension Brother   . Hypertension Brother   . Diabetes Mellitus I Brother   . Benign prostatic hyperplasia Brother   . Heart failure Paternal Uncle     Past Surgical History:  Procedure Laterality Date  . ABDOMINAL HYSTERECTOMY    . CARDIAC CATHETERIZATION     in [facility], clean, per pt.  Marland Kitchen RIGHT/LEFT HEART CATH AND CORONARY ANGIOGRAPHY N/A 11/17/2018   Procedure: RIGHT/LEFT HEART CATH AND CORONARY ANGIOGRAPHY;  Surgeon: Belva Crome, MD;  Location: [facility];  Service: Cardiovascular;  Laterality: N/A;   Social History   Occupational History  . Occupation: Surveyor, quantity: [employer]  Tobacco Use  . Smoking status: Never Smoker  . Smokeless tobacco: Never Used  Substance and Sexual Activity  . Alcohol use: Yes    Alcohol/week: 0.0 standard drinks    Comment: less than once a month  . Drug use: No  . Sexual activity: Never

## 2019-05-05 NOTE — Addendum Note (Signed)
Addended by: Lendon Collar on: 05/05/2019 09:55 AM   Modules accepted: Orders

## 2019-05-06 ENCOUNTER — Telehealth (HOSPITAL_COMMUNITY): Payer: Self-pay | Admitting: *Deleted

## 2019-05-06 ENCOUNTER — Encounter (HOSPITAL_COMMUNITY): Payer: Self-pay | Admitting: *Deleted

## 2019-05-06 DIAGNOSIS — I5022 Chronic systolic (congestive) heart failure: Secondary | ICD-10-CM

## 2019-05-06 MED FILL — ACETAMINOPHEN/COD #3 TABLET: 300-30 | 2 days supply | Qty: 12 | Fill #0

## 2019-05-06 NOTE — Telephone Encounter (Signed)
Spoke with Toni [name]. Toni [name] would like to be discharged from phase 2 cardiac rehab at this time and may consider enrolling later. Toni [name] will hold off on virtual cardiac rehab at this time.Barnet Pall, RN,BSN 05/06/2019 4:42 PM

## 2019-05-06 NOTE — Progress Notes (Signed)
Discharge Progress Report  Patient Details  Name: Toni [name] MRN: [id number] Date of Birth: September 02, 1956 Referring Provider:     CARDIAC REHAB PHASE II ORIENTATION from 01/15/2019 in [place]  Referring Provider  Dr. Aundra Dubin       Number of Visits: 5  Reason for Discharge:  Toni [name] has decided to be discharged from cardiac rehab for now may re enroll later Smoking History:  Social History   Tobacco Use  Smoking Status Never Smoker  Smokeless Tobacco Never Used    Diagnosis:  03/49/17 Systolic CHF  ADL UCSD:   Initial Exercise Prescription:   Discharge Exercise Prescription (Final Exercise Prescription Changes):   Functional Capacity:   Psychological, QOL, Others - Outcomes: PHQ 2/9: Depression screen PHQ 2/9 01/21/2019  Decreased Interest 0  Down, Depressed, Hopeless 0  PHQ - 2 Score 0  Some encounter information is confidential and restricted. Go to Review Flowsheets activity to see all data.    Quality of Life:   Personal Goals: Goals established at orientation with interventions provided to work toward goal.    Personal Goals Discharge:   Exercise Goals and Review:   Exercise Goals Re-Evaluation:   Nutrition & Weight - Outcomes:    Nutrition:   Nutrition Discharge:   Education Questionnaire Score:   [place] attended 5 exercise sessions between 01/15/19-01/30/19. Cardiac rehab has been closed due to the [place] 19 Pandemic. Toni [name] has decided to be discharged from phase 2 cardiac rehab and is going to hold off on virtual cardiac rehab as well. Toni [name] may decide to re enroll in phase 2 at a later date and time after her EP evaluation.Toni Pall, RN,BSN 05/08/2019 8:45 AM

## 2019-05-11 ENCOUNTER — Telehealth (HOSPITAL_COMMUNITY): Payer: Self-pay | Admitting: *Deleted

## 2019-05-11 ENCOUNTER — Encounter (HOSPITAL_COMMUNITY): Payer: Self-pay | Admitting: *Deleted

## 2019-05-11 NOTE — Telephone Encounter (Signed)
Pt left VM requesting documentation that Dr.McLean recommends she get an ICD. Her last office note mentions the need for ICD and EP referral.  I called pt to get more information.  Patient did not answer left VM requesting call back

## 2019-05-13 MED FILL — glipiZIDE ER 10 MG TB24: 10 | 90 days supply | Qty: 180 | Fill #0

## 2019-05-13 MED FILL — LOSARTAN POTASSIUM 25 MG TA: 25 | 30 days supply | Qty: 60 | Fill #1

## 2019-05-18 ENCOUNTER — Encounter: Payer: Self-pay | Admitting: Physical Therapy

## 2019-05-18 ENCOUNTER — Ambulatory Visit: Payer: No Typology Code available for payment source | Attending: Orthopaedic Surgery | Admitting: Physical Therapy

## 2019-05-18 ENCOUNTER — Other Ambulatory Visit: Payer: Self-pay

## 2019-05-18 DIAGNOSIS — M25612 Stiffness of left shoulder, not elsewhere classified: Secondary | ICD-10-CM | POA: Insufficient documentation

## 2019-05-18 DIAGNOSIS — M25512 Pain in left shoulder: Secondary | ICD-10-CM | POA: Diagnosis present

## 2019-05-18 DIAGNOSIS — R293 Abnormal posture: Secondary | ICD-10-CM | POA: Diagnosis present

## 2019-05-18 DIAGNOSIS — G8929 Other chronic pain: Secondary | ICD-10-CM | POA: Diagnosis present

## 2019-05-18 DIAGNOSIS — M6281 Muscle weakness (generalized): Secondary | ICD-10-CM | POA: Diagnosis present

## 2019-05-18 NOTE — Therapy (Signed)
[place], [place], [address] Phone: [phone]   Fax:  [phone]  Physical Therapy Evaluation  Patient Details  Name: Toni [name] MRN: [id number] Date of Birth: 1956/04/16 Referring Provider (PT): Joni Fears MD   Encounter Date: 05/18/2019  PT End of Session - 05/18/19 0839    Visit Number  1    Number of Visits  17    Date for PT Re-Evaluation  07/13/19    Authorization Type  [facility] [employer]    PT Start Time  [phone]   pt arrived 8 min late   PT Stop Time  0925    PT Time Calculation (min)  47 min    Activity Tolerance  Patient tolerated treatment well    Behavior During Therapy  [facility] for tasks assessed/performed       Past Medical History:  Diagnosis Date  . CHF (congestive heart failure) ([place])   . Diabetes mellitus without complication ([place])   . Hypertension     Past Surgical History:  Procedure Laterality Date  . ABDOMINAL HYSTERECTOMY    . CARDIAC CATHETERIZATION     in [facility], clean, per pt.  Marland Kitchen RIGHT/LEFT HEART CATH AND CORONARY ANGIOGRAPHY N/A 11/17/2018   Procedure: RIGHT/LEFT HEART CATH AND CORONARY ANGIOGRAPHY;  Surgeon: Belva Crome, MD;  Location: [place] [facility];  Service: Cardiovascular;  Laterality: N/A;    There were no vitals filed for this visit.   Subjective Assessment - 05/18/19 0847    Subjective  pt is a 63 y.o F with CC of L shoulder pain that started with no specific MOI back in April of 2020. She noted she became aware when she was doing exercises from cardiac rehab. pain stays mostly in the shoulder and reports some referral in to the elbow/ hands. Since onset she reports pain has improved and has been doing stretching.    Limitations  Lifting    How long can you sit comfortably?  unlimited    How long can you stand comfortably?  unlimited    How long can you walk comfortably?  unlimited    Diagnostic tests  X-ray MD    Patient Stated Goals  to be able to  use the L arm, decrease pain    Currently in Pain?  Yes    Pain Score  1    at worst 10/10   Pain Location  Shoulder    Pain Orientation  Right;Anterior;Lateral    Pain Descriptors / Indicators  Burning;Throbbing    Pain Type  Chronic pain    Pain Radiating Towards  to the lateral shoulder and down into the wrist and hand occasionally    Pain Onset  More than a month ago    Pain Frequency  Constant    Aggravating Factors   reaching with the LUE, quick movements    Pain Relieving Factors  ice pack, heating pad, medication    Effect of Pain on Daily Activities  limited lifting and activity with the LUE         [facility] PT Assessment - 05/18/19 0842      Assessment   Medical Diagnosis  Chronic left shoulder pain     Referring Provider (PT)  Joni Fears MD    Onset Date/Surgical Date  --   April, 2020   Hand Dominance  Right    Next MD Visit  make on PRN    Prior Therapy  no  Precautions   Precautions  None      Restrictions   Weight Bearing Restrictions  No      Balance Screen   Has the patient fallen in the past 6 months  Yes    How many times?  1    Has the patient had a decrease in activity level because of a fear of falling?   No    Is the patient reluctant to leave their home because of a fear of falling?   No      Home Environment   Living Environment  Private residence    Living Arrangements  Alone    Type of [place] Access  Level entry    [place]  None      Prior Function   Level of Independence  Independent    Vocation  Full time employment   currently on long term disability   Vocation Requirements  prolonged sitting, occasional carrying laptop/files    Leisure  yard Press photographer,       Cognition   Overall Cognitive Status  Within Functional Limits for tasks assessed      Observation/Other Assessments   Focus on Therapeutic Outcomes (FOTO)   58% limited   predicted 36% limited     Posture/Postural Control    Posture/Postural Control  Postural limitations    Postural Limitations  Rounded Shoulders;Forward head      ROM / Strength   AROM / PROM / Strength  AROM;PROM;Strength      AROM   Overall AROM Comments  Signficant shoulder hiking throughout L shoulder assessment    AROM Assessment Site  Shoulder    Right/Left Shoulder  Right;Left    Right Shoulder Extension  71 Degrees    Right Shoulder Flexion  151 Degrees    Right Shoulder ABduction  141 Degrees    Right Shoulder Internal Rotation  --   T7   Right Shoulder External Rotation  --   T5   Left Shoulder Extension  46 Degrees   reported stiffness at end range   Left Shoulder Flexion  106 Degrees   tightness noted at end range   Left Shoulder ABduction  74 Degrees   reproduced concordant pain and end range stiffness   Left Shoulder Internal Rotation  --   L4,  stiffness throughout ROM   Left Shoulder External Rotation  --   C5 stiffness throughout ROM     PROM   Overall PROM Comments  end range pain noted with all motions    PROM Assessment Site  Shoulder    Right/Left Shoulder  Left    Left Shoulder Flexion  120 Degrees    Left Shoulder ABduction  70 Degrees    Left Shoulder Internal Rotation  40 Degrees    Left Shoulder External Rotation  10 Degrees      Strength   Strength Assessment Site  Shoulder;Hand    Right/Left Shoulder  Right;Left    Right Shoulder Flexion  4+/5    Right Shoulder Extension  5/5    Right Shoulder ABduction  4+/5    Right Shoulder Internal Rotation  5/5    Right Shoulder External Rotation  4+/5    Left Shoulder Flexion  3+/5   in available ROM   Left Shoulder Extension  3+/5   in available ROM   Left Shoulder ABduction  3+/5   in available ROM  Left Shoulder Internal Rotation  3+/5   in available ROM   Left Shoulder External Rotation  3+/5   in available ROM   Right Hand Grip (lbs)  52.6   53,54,51   Left Hand Grip (lbs)  33.3   34,34,32     Palpation   Palpation comment  TTP along  the anterior aspect of the L GHJ, along the infraspinatus. multiple trigger points along the upper trap and levator scapuale      Special Tests    Special Tests  Rotator Cuff Impingement                Objective measurements completed on examination: See above findings.      [place] Adult PT Treatment/Exercise - 05/18/19 0842      Exercises   Exercises  Shoulder      Shoulder Exercises: Seated   Other Seated Exercises  shoulder PROM ER with elbow on table 1 x 10 hold 3 sec      Shoulder Exercises: ROM/Strengthening   Other ROM/Strengthening Exercises  wand ER 2 x 10 cues for proper form, with towel between elbow and body    Other ROM/Strengthening Exercises  wand flexion/ abduction 2 x 10 standing   demonstration for proper form     Shoulder Exercises: Stretch   Other Shoulder Stretches  upper trap 2 x 30 sec              PT Education - 05/18/19 0940    Education Details  evaluation findings, POC, goals, HEP  with proper form/ rationale,    Person(s) Educated  Patient    Methods  Explanation;Verbal cues;Handout    Comprehension  Verbalized understanding;Verbal cues required       PT Short Term Goals - 05/18/19 0934      PT SHORT TERM GOAL #1   Title  pt to be I with inital HEP    Time  4    Period  Weeks    Status  New    Target Date  06/15/19      PT SHORT TERM GOAL #2   Title  pt to verbalize proper posture and lifting mechanics to reduce and prevent shoulder pain    Time  4    Period  Weeks    Status  New    Target Date  06/15/19      PT SHORT TERM GOAL #3   Title  increase L shoulder flexion/ abduction by >/= 8 degrees with </= 4/10 pain for functional ROM progression    Time  4    Period  Weeks    Status  New    Target Date  06/15/19      PT SHORT TERM GOAL #4   Title  increase L grip strength by >/= 10# to demo improving  L shoulder function    Time  4    Period  Weeks    Status  New    Target Date  07/13/19        PT Long Term  Goals - 05/18/19 0936      PT LONG TERM GOAL #1   Title  increase L shoulder flexion/ abduction to >/= 130 degrees and ER/IR to [facility] compard bil with </= 2/10 pain for functional ROM for ADLS and work related tasks    Time  8    Period  Weeks    Status  New    Target Date  07/13/19  PT LONG TERM GOAL #2   Title  increase L shoulder strength to >/= 4/5 in all planes to promote scapulohumeral stability    Time  8    Period  Weeks    Status  New    Target Date  07/13/19      PT LONG TERM GOAL #3   Title  pt to be able to lift/ lower >/= 8# to and from and overhead shelf and push/ pull >/=10# with LUE reporting </= 2/10 pain for functional strength required for work related activities    Time  8    Period  Weeks    Status  New    Target Date  07/13/19      PT LONG TERM GOAL #4   Title  increase FOTO score to </=36% limited to demo improvement in function    Time  8    Period  Weeks    Status  New    Target Date  07/13/19      PT LONG TERM GOAL #5   Title  pt to be I with all HEP given as of last visit to maintain and progress current level of function    Time  8    Period  Weeks    Status  New    Target Date  07/13/19             Plan - 05/18/19 0930    Clinical Impression Statement  pt presents to OPPT with CC of L shoulder pain starting with no MOI ~ 2 months ago. she demonstrates limited AROM /PROM  and associated weakness compared bil. ROM limitations are similar with active and passive motions which are consistent with dx of frozen shoulder. she would benefit from physical therapy to decrease L shoulder pain, improve ROM/ strength, promote efficent posture and lifting mechanics and return to PLOF by addressing the deficits listed.    Personal Factors and Comorbidities  Age;Comorbidity 3+    Comorbidities  HX of CHA, DM, and HTN    Examination-Activity Limitations  Lift;Reach Overhead    Stability/Clinical Decision Making  Evolving/Moderate complexity    Clinical  Decision Making  Moderate    Rehab Potential  Good    PT Frequency  2x / week    PT Duration  8 weeks    PT Treatment/Interventions  ADLs/Self Care Home Management;Cryotherapy;Electrical Stimulation;Iontophoresis 4mg /ml Dexamethasone;Moist Heat;Ultrasound;Functional mobility training;Therapeutic activities;Therapeutic exercise;Patient/family education;Neuromuscular re-education;Manual techniques;Passive range of motion;Dry needling;Taping    PT Next Visit Plan  review/ update HEP, shoulder mobs, PROM > AAROM, begin scapular setting, modalities for pain    PT Home Exercise Plan  wand AAROM flexion/ abduction/ ER, upper trap stretch, self ER PROM    Consulted and Agree with Plan of Care  Patient       Patient will benefit from skilled therapeutic intervention in order to improve the following deficits and impairments:  Improper body mechanics, Abnormal gait, Decreased activity tolerance, Impaired UE functional use, Increased fascial restricitons, Decreased endurance, Decreased strength, Increased muscle spasms, Postural dysfunction, Pain, Decreased range of motion  Visit Diagnosis: 1. Chronic left shoulder pain   2. Stiffness of left shoulder, not elsewhere classified   3. Muscle weakness (generalized)   4. Abnormal posture        Problem List Patient Active Problem List   Diagnosis Date Noted  . Hypomagnesemia 11/18/2018  . Hypokalemia 11/15/2018  . Diabetes mellitus without complication ([place])   . Coronary artery disease   . Acute  on chronic systolic CHF (congestive heart failure) ([place]) 01/04/2016  . Hyperlipidemia 01/03/2016  . Cardiomegaly - hypertensive 01/03/2016  . DM (diabetes mellitus), type 2 with complications ([place]) 18/84/1660  . Hypertension 01/03/2014   Starr Lake PT, DPT, LAT, ATC  05/18/19  9:41 AM      [facility] [address] [place], [place], [address] Phone: [phone]   Fax:   [phone]  Name: ALYSSON GEIST MRN: [id number] Date of Birth: 1956-05-02

## 2019-05-25 ENCOUNTER — Institutional Professional Consult (permissible substitution): Payer: No Typology Code available for payment source | Admitting: Internal Medicine

## 2019-05-26 ENCOUNTER — Encounter: Payer: Self-pay | Admitting: Physical Therapy

## 2019-05-26 ENCOUNTER — Other Ambulatory Visit: Payer: Self-pay

## 2019-05-26 ENCOUNTER — Ambulatory Visit: Payer: No Typology Code available for payment source | Attending: Orthopaedic Surgery | Admitting: Physical Therapy

## 2019-05-26 DIAGNOSIS — G8929 Other chronic pain: Secondary | ICD-10-CM | POA: Diagnosis present

## 2019-05-26 DIAGNOSIS — M6281 Muscle weakness (generalized): Secondary | ICD-10-CM | POA: Insufficient documentation

## 2019-05-26 DIAGNOSIS — M25512 Pain in left shoulder: Secondary | ICD-10-CM | POA: Diagnosis present

## 2019-05-26 DIAGNOSIS — R293 Abnormal posture: Secondary | ICD-10-CM | POA: Insufficient documentation

## 2019-05-26 DIAGNOSIS — M25612 Stiffness of left shoulder, not elsewhere classified: Secondary | ICD-10-CM | POA: Diagnosis present

## 2019-05-26 MED FILL — SIMVASTATIN 20 MG TABLET: 20 | 90 days supply | Qty: 90 | Fill #0

## 2019-05-26 MED FILL — SPIRONOLACTONE 25 MG TABLET: 25 | 30 days supply | Qty: 30 | Fill #0

## 2019-05-26 NOTE — Therapy (Signed)
[place], [place], [address] Phone: [phone]   Fax:  [phone]  Physical Therapy Treatment  Patient Details  Name: Toni [name] MRN: [id number] Date of Birth: 1956/09/29 Referring Provider (PT): Joni Fears MD   Encounter Date: 05/26/2019  PT End of Session - 05/26/19 2016    Visit Number  2    Number of Visits  17    Date for PT Re-Evaluation  07/13/19    Authorization Type  [facility]    PT Start Time  1100    PT Stop Time  1144    PT Time Calculation (min)  44 min    Activity Tolerance  Patient tolerated treatment well    Behavior During Therapy  [facility] for tasks assessed/performed       Past Medical History:  Diagnosis Date  . CHF (congestive heart failure) ([place])   . Diabetes mellitus without complication ([place])   . Hypertension     Past Surgical History:  Procedure Laterality Date  . ABDOMINAL HYSTERECTOMY    . CARDIAC CATHETERIZATION     in [facility], clean, per pt.  Marland Kitchen RIGHT/LEFT HEART CATH AND CORONARY ANGIOGRAPHY N/A 11/17/2018   Procedure: RIGHT/LEFT HEART CATH AND CORONARY ANGIOGRAPHY;  Surgeon: Belva Crome, MD;  Location: [place] [facility];  Service: Cardiovascular;  Laterality: N/A;    There were no vitals filed for this visit.  Subjective Assessment - 05/26/19 1105    Subjective  Patient reports her shoulder is better. She has been able to put her deoderent under her arm. She has been working hard on her stretching and exercises    Limitations  Lifting    How long can you sit comfortably?  unlimited    How long can you stand comfortably?  unlimited    How long can you walk comfortably?  unlimited    Diagnostic tests  X-ray MD    Patient Stated Goals  to be able to use the L arm, decrease pain    Currently in Pain?  Yes    Pain Score  2     Pain Location  Shoulder    Pain Orientation  Right;Left    Pain Descriptors / Indicators  Aching    Pain Type  Chronic pain    Pain Radiating Towards  to lateral shoulder    Pain Onset  More than a month ago    Pain Frequency  Constant    Aggravating Factors   reacing with UE    Pain Relieving Factors  icepack    Effect of Pain on Daily Activities  limited lifiting                       [facility] Adult PT Treatment/Exercise - 05/26/19 0001      Transfers   Transfers  --    Sit to Stand  --      Shoulder Exercises: Supine   Other Supine Exercises  wand flexion x10 3 second hold wih cuing not to go past pain    Other Supine Exercises  wand ER with cuing for arm positioning and not to go to far       Shoulder Exercises: Sidelying   Other Sidelying Exercises  sidelying ER x10       Shoulder Exercises: Standing   Internal Rotation  15 reps    Theraband Level (Shoulder Internal Rotation)  Level 1 (Yellow)    Extension  15  reps    Theraband Level (Shoulder Extension)  Level 1 (Yellow)    Row  15 reps    Theraband Level (Shoulder Row)  Level 2 (Red)      Shoulder Exercises: Pulleys   Flexion  2 minutes      Shoulder Exercises: Stretch   Other Shoulder Stretches  upper trap 2 x 30 sec       Manual Therapy   Manual Therapy  Joint mobilization;Soft tissue mobilization    Joint Mobilization  PA and inferior glides     Soft tissue mobilization  to upper traps             PT Education - 05/26/19 2015    Education Details  reviewed HEp and symptom mangement    Person(s) Educated  Patient    Methods  Explanation;Demonstration;Tactile cues;Verbal cues    Comprehension  Verbalized understanding;Returned demonstration;Verbal cues required;Tactile cues required       PT Short Term Goals - 05/26/19 2018      PT SHORT TERM GOAL #1   Title  pt to be I with inital HEP    Time  4    Status  On-going      PT SHORT TERM GOAL #2   Title  pt to verbalize proper posture and lifting mechanics to reduce and prevent shoulder pain    Time  4    Period  Weeks    Status  On-going    Target Date   06/15/19      PT SHORT TERM GOAL #3   Title  increase L shoulder flexion/ abduction by >/= 8 degrees with </= 4/10 pain for functional ROM progression    Time  4    Period  Weeks    Status  On-going      PT SHORT TERM GOAL #4   Title  increase L grip strength by >/= 10# to demo improving  L shoulder function    Time  4    Period  Weeks    Status  On-going        PT Long Term Goals - 05/18/19 0936      PT LONG TERM GOAL #1   Title  increase L shoulder flexion/ abduction to >/= 130 degrees and ER/IR to [facility] compard bil with </= 2/10 pain for functional ROM for ADLS and work related tasks    Time  8    Period  Weeks    Status  New    Target Date  07/13/19      PT LONG TERM GOAL #2   Title  increase L shoulder strength to >/= 4/5 in all planes to promote scapulohumeral stability    Time  8    Period  Weeks    Status  New    Target Date  07/13/19      PT LONG TERM GOAL #3   Title  pt to be able to lift/ lower >/= 8# to and from and overhead shelf and push/ pull >/=10# with LUE reporting </= 2/10 pain for functional strength required for work related activities    Time  8    Period  Weeks    Status  New    Target Date  07/13/19      PT LONG TERM GOAL #4   Title  increase FOTO score to </=36% limited to demo improvement in function    Time  8    Period  Weeks    Status  New    Target Date  07/13/19      PT LONG TERM GOAL #5   Title  pt to be I with all HEP given as of last visit to maintain and progress current level of function    Time  8    Period  Weeks    Status  New    Target Date  07/13/19            Plan - 05/26/19 1125    Clinical Impression Statement  Patient tolerated treatment well. She had a significant increase in mobility and pain coming in compared to her intialeval. She tolerated moilization well. She has a large trigger point in her upper trap She was given more exercises to work on strengthening new ranges.    Personal Factors and Comorbidities   Age;Comorbidity 3+    Comorbidities  HX of CHA, DM, and HTN    Examination-Activity Limitations  Lift;Reach Overhead    Stability/Clinical Decision Making  Evolving/Moderate complexity    Clinical Decision Making  Moderate    Rehab Potential  Good    PT Frequency  2x / week    PT Duration  8 weeks    PT Treatment/Interventions  ADLs/Self Care Home Management;Cryotherapy;Electrical Stimulation;Iontophoresis 4mg /ml Dexamethasone;Moist Heat;Ultrasound;Functional mobility training;Therapeutic activities;Therapeutic exercise;Patient/family education;Neuromuscular re-education;Manual techniques;Passive range of motion;Dry needling;Taping    PT Next Visit Plan  review/ update HEP, shoulder mobs, PROM > AAROM, begin scapular setting, modalities for pain    PT Home Exercise Plan  wand AAROM flexion/ abduction/ ER, upper trap stretch, self ER PROM    Consulted and Agree with Plan of Care  Patient       Patient will benefit from skilled therapeutic intervention in order to improve the following deficits and impairments:  Improper body mechanics, Abnormal gait, Decreased activity tolerance, Impaired UE functional use, Increased fascial restricitons, Decreased endurance, Decreased strength, Increased muscle spasms, Postural dysfunction, Pain, Decreased range of motion  Visit Diagnosis: 1. Stiffness of left shoulder, not elsewhere classified   2. Chronic left shoulder pain   3. Muscle weakness (generalized)   4. Abnormal posture        Problem List Patient Active Problem List   Diagnosis Date Noted  . Hypomagnesemia 11/18/2018  . Hypokalemia 11/15/2018  . Diabetes mellitus without complication ([place])   . Coronary artery disease   . Acute on chronic systolic CHF (congestive heart failure) ([place]) 01/04/2016  . Hyperlipidemia 01/03/2016  . Cardiomegaly - hypertensive 01/03/2016  . DM (diabetes mellitus), type 2 with complications ([place]) 64/15/8309  . Hypertension 01/03/2014    Carney Living  PT DPT  05/26/2019, 8:20 PM  [facility] [address] [place], [place], [address] Phone: [phone]   Fax:  [phone]  Name: Toni [name] MRN: [id number] Date of Birth: [birth year]

## 2019-05-27 ENCOUNTER — Encounter (HOSPITAL_COMMUNITY): Payer: Self-pay

## 2019-05-27 ENCOUNTER — Telehealth (HOSPITAL_COMMUNITY): Payer: Self-pay

## 2019-05-27 NOTE — Telephone Encounter (Signed)
Pt left vm that she missed her appt with EP and had an out of work letter for that date.  She needed an updated letter for the new appt 7/16 keeping her out of work until that date. Will d/w MD in the morning while in office. Patient made aware of same and appreciative.

## 2019-05-28 MED FILL — ACCU-CHEK FASTCLIX LANCETS: 90 days supply | Qty: 204 | Fill #0

## 2019-05-28 MED FILL — BIDIL TABLET: 20-37.5 | 30 days supply | Qty: 90 | Fill #1

## 2019-05-28 MED FILL — UNIFINE PENTIPS 32GX5/32: 32G X 4 MM | 90 days supply | Qty: 100 | Fill #0

## 2019-05-28 MED FILL — UNIFINE PENTIPS 32GX5/32": 32G X 4 MM | 90 days supply | Qty: 100 | Fill #0

## 2019-05-28 MED FILL — ACCU-CHEK GUIDE STRP: 90 days supply | Qty: 200 | Fill #0

## 2019-05-28 MED FILL — FUROSEMIDE 20 MG TABS: 20 | 90 days supply | Qty: 90 | Fill #1

## 2019-05-28 NOTE — Telephone Encounter (Signed)
Dr Aundra Dubin signed letter, patient to pick up from office today.

## 2019-05-29 ENCOUNTER — Encounter

## 2019-05-29 MED FILL — LINZESS 145 MCG CAPSULE: 145 | 90 days supply | Qty: 90 | Fill #0

## 2019-06-02 ENCOUNTER — Other Ambulatory Visit: Payer: Self-pay

## 2019-06-02 ENCOUNTER — Ambulatory Visit: Payer: No Typology Code available for payment source | Admitting: Physical Therapy

## 2019-06-02 ENCOUNTER — Encounter: Payer: Self-pay | Admitting: Physical Therapy

## 2019-06-02 DIAGNOSIS — M6281 Muscle weakness (generalized): Secondary | ICD-10-CM

## 2019-06-02 DIAGNOSIS — M25612 Stiffness of left shoulder, not elsewhere classified: Secondary | ICD-10-CM | POA: Diagnosis not present

## 2019-06-02 DIAGNOSIS — R293 Abnormal posture: Secondary | ICD-10-CM

## 2019-06-02 DIAGNOSIS — G8929 Other chronic pain: Secondary | ICD-10-CM

## 2019-06-02 NOTE — Therapy (Signed)
[place], [place], [address] Phone: [phone]   Fax:  [phone]  Physical Therapy Treatment  Patient Details  Name: Toni [name] MRN: [id number] Date of Birth: August 07, 1956 Referring Provider (PT): Joni Fears MD   Encounter Date: 06/02/2019  PT End of Session - 06/02/19 1600    Visit Number  3    Number of Visits  17    Date for PT Re-Evaluation  07/13/19    Authorization Type  [facility]    PT Start Time  1600    PT Stop Time  1640    PT Time Calculation (min)  40 min    Activity Tolerance  Patient tolerated treatment well    Behavior During Therapy  [facility] for tasks assessed/performed       Past Medical History:  Diagnosis Date  . CHF (congestive heart failure) ([place])   . Diabetes mellitus without complication ([place])   . Hypertension     Past Surgical History:  Procedure Laterality Date  . ABDOMINAL HYSTERECTOMY    . CARDIAC CATHETERIZATION     in [facility], clean, per pt.  Marland Kitchen RIGHT/LEFT HEART CATH AND CORONARY ANGIOGRAPHY N/A 11/17/2018   Procedure: RIGHT/LEFT HEART CATH AND CORONARY ANGIOGRAPHY;  Surgeon: Belva Crome, MD;  Location: [facility];  Service: Cardiovascular;  Laterality: N/A;    There were no vitals filed for this visit.  Subjective Assessment - 06/02/19 1600    Subjective  "I am doing fine, the shoulder is dong better. The pain isn't as bad"    Currently in Pain?  Yes    Pain Score  0-No pain   took medication for pain at lunch                      [facility] Adult PT Treatment/Exercise - 06/02/19 0001      Shoulder Exercises: Seated   Row  10 reps;Strengthening;Theraband;Both   x2 sets   Theraband Level (Shoulder Row)  Level 3 (Green)    Internal Rotation  10 reps;Theraband    Theraband Level (Shoulder Internal Rotation)  Level 2 (Red)    Flexion  Strengthening;10 reps   scaption angle x 2 sets     Shoulder Exercises: Sidelying   External  Rotation  Left;10 reps;Weights   x sets   External Rotation Weight (lbs)  2    ABduction  Strengthening;Left;10 reps   x 2 sets   Other Sidelying Exercises  --      Shoulder Exercises: Standing   Other Standing Exercises  towel IR starting moving across the back 2 x 10 holding 5 seconds      Shoulder Exercises: Pulleys   Flexion  2 minutes    Scaption  2 minutes    Other Pulley Exercises  IR using pulleys x 2 min      Manual Therapy   Manual Therapy  Scapular mobilization;Passive ROM    Joint Mobilization  AP with IR/ER working into end ranges, inferior glides    Scapular Mobilization  upward assist with pt in R sidleying performed during L shoulder abduction    Passive ROM  L shoulder abduction/ flexion working into end ranges with gentle oscillations to decrease pain.             PT Education - 06/02/19 1619    Education Details  updated HEP for shoulder IR    Person(s) Educated  Patient    Methods  Explanation;Verbal cues;Handout    Comprehension  Verbalized understanding;Verbal cues required       PT Short Term Goals - 05/26/19 2018      PT SHORT TERM GOAL #1   Title  pt to be I with inital HEP    Time  4    Status  On-going      PT SHORT TERM GOAL #2   Title  pt to verbalize proper posture and lifting mechanics to reduce and prevent shoulder pain    Time  4    Period  Weeks    Status  On-going    Target Date  06/15/19      PT SHORT TERM GOAL #3   Title  increase L shoulder flexion/ abduction by >/= 8 degrees with </= 4/10 pain for functional ROM progression    Time  4    Period  Weeks    Status  On-going      PT SHORT TERM GOAL #4   Title  increase L grip strength by >/= 10# to demo improving  L shoulder function    Time  4    Period  Weeks    Status  On-going        PT Long Term Goals - 05/18/19 0936      PT LONG TERM GOAL #1   Title  increase L shoulder flexion/ abduction to >/= 130 degrees and ER/IR to [facility] compard bil with </= 2/10 pain for  functional ROM for ADLS and work related tasks    Time  8    Period  Weeks    Status  New    Target Date  07/13/19      PT LONG TERM GOAL #2   Title  increase L shoulder strength to >/= 4/5 in all planes to promote scapulohumeral stability    Time  8    Period  Weeks    Status  New    Target Date  07/13/19      PT LONG TERM GOAL #3   Title  pt to be able to lift/ lower >/= 8# to and from and overhead shelf and push/ pull >/=10# with LUE reporting </= 2/10 pain for functional strength required for work related activities    Time  8    Period  Weeks    Status  New    Target Date  07/13/19      PT LONG TERM GOAL #4   Title  increase FOTO score to </=36% limited to demo improvement in function    Time  8    Period  Weeks    Status  New    Target Date  07/13/19      PT LONG TERM GOAL #5   Title  pt to be I with all HEP given as of last visit to maintain and progress current level of function    Time  8    Period  Weeks    Status  New    Target Date  07/13/19            Plan - 06/02/19 1637    Clinical Impression Statement  Mrs. Stauffer is making execellent progress with shoulder ROM and notes consistency with her HEP. continued working on abdcution and reaching behind the back, continued to work on strengthening of the shoulder which she performed well reporting no increase in pain.    PT Next Visit Plan  update HEP, shoulder mobs, AAROM > AROM ,  scapular stability strengthening, modalities for pain    PT Home Exercise Plan  wand AAROM flexion/ abduction/ ER, upper trap stretch, self ER PROM, towle IR    Consulted and Agree with Plan of Care  Patient       Patient will benefit from skilled therapeutic intervention in order to improve the following deficits and impairments:     Visit Diagnosis: 1. Stiffness of left shoulder, not elsewhere classified   2. Chronic left shoulder pain   3. Muscle weakness (generalized)   4. Abnormal posture        Problem  List Patient Active Problem List   Diagnosis Date Noted  . Hypomagnesemia 11/18/2018  . Hypokalemia 11/15/2018  . Diabetes mellitus without complication ([place])   . Coronary artery disease   . Acute on chronic systolic CHF (congestive heart failure) ([place]) 01/04/2016  . Hyperlipidemia 01/03/2016  . Cardiomegaly - hypertensive 01/03/2016  . DM (diabetes mellitus), type 2 with complications ([place]) 35/59/7416  . Hypertension 01/03/2014   Starr Lake PT, DPT, LAT, ATC  06/02/19  4:42 PM      [facility] [address] [place], [place], [address] Phone: [phone]   Fax:  [phone]  Name: CHELBI HERBER MRN: [id number] Date of Birth: 06/03/1956

## 2019-06-04 ENCOUNTER — Ambulatory Visit (INDEPENDENT_AMBULATORY_CARE_PROVIDER_SITE_OTHER): Payer: No Typology Code available for payment source | Admitting: Internal Medicine

## 2019-06-04 ENCOUNTER — Encounter: Payer: Self-pay | Admitting: Internal Medicine

## 2019-06-04 ENCOUNTER — Ambulatory Visit: Payer: No Typology Code available for payment source | Admitting: Physical Therapy

## 2019-06-04 ENCOUNTER — Other Ambulatory Visit: Payer: Self-pay

## 2019-06-04 DIAGNOSIS — I428 Other cardiomyopathies: Secondary | ICD-10-CM | POA: Diagnosis not present

## 2019-06-04 LAB — CBC WITH DIFFERENTIAL/PLATELET
Basophils Absolute: 0 10*3/uL (ref 0.0–0.2)
Basos: 1 %
EOS (ABSOLUTE): 0.2 10*3/uL (ref 0.0–0.4)
Eos: 3 %
Hematocrit: 44.8 % (ref 34.0–46.6)
Hemoglobin: 14.8 g/dL (ref 11.1–15.9)
Immature Grans (Abs): 0 10*3/uL (ref 0.0–0.1)
Immature Granulocytes: 0 %
Lymphocytes Absolute: 2.4 10*3/uL (ref 0.7–3.1)
Lymphs: 36 %
MCH: 29.8 pg (ref 26.6–33.0)
MCHC: 33 g/dL (ref 31.5–35.7)
MCV: 90 fL (ref 79–97)
Monocytes Absolute: 0.5 10*3/uL (ref 0.1–0.9)
Monocytes: 8 %
Neutrophils Absolute: 3.5 10*3/uL (ref 1.4–7.0)
Neutrophils: 52 %
Platelets: 287 10*3/uL (ref 150–450)
RBC: 4.97 x10E6/uL (ref 3.77–5.28)
RDW: 11.7 % (ref 11.7–15.4)
WBC: 6.7 10*3/uL (ref 3.4–10.8)

## 2019-06-04 NOTE — Progress Notes (Signed)
HPI Toni [name] is referred today for evaluation of and consider for an ICD. She has a longstanding h/o a DCM. She has never had syncope. She does not have palpitations. She has been on maximal medical therapy. She denies peripheral edema. Her EF has been in the 15-20% range. She has non-obstructive CAD. Allergies  Allergen Reactions  . Biaxin [Clarithromycin]   . Enalapril Maleate Other (See Comments)    Lip swelling / angioedema  . Vasotec [Enalapril] Other (See Comments)    Lip swelling / angioedema Tolerates ARB     Current Outpatient Medications  Medication Sig Dispense Refill  . aspirin 81 MG chewable tablet Chew 1 tablet (81 mg total) by mouth daily. 30 tablet 0  . carvedilol (COREG) 25 MG tablet Take 0.5 tablets (12.5 mg total) by mouth 2 (two) times daily with a meal. 30 tablet 0  . dapagliflozin propanediol (FARXIGA) 5 MG TABS tablet Take 5 mg by mouth daily.    . digoxin (LANOXIN) 0.125 MG tablet Take 1 tablet (0.125 mg total) by mouth daily. 90 tablet 3  . furosemide (LASIX) 20 MG tablet TAKE 1 TABLET (20 MG TOTAL) BY MOUTH DAILY. 30 tablet 6  . glipiZIDE (GLUCOTROL XL) 10 MG 24 hr tablet Take 10 mg by mouth 2 (two) times daily with a meal.     . isosorbide-hydrALAZINE (BIDIL) 20-37.5 MG tablet Take 1 tablet by mouth 3 (three) times daily. 90 tablet 6  . LINZESS 145 MCG CAPS capsule Take 145 mcg by mouth daily.    . Liraglutide (VICTOZA [place]) Inject 1.8 mg into the skin every evening.     Marland Kitchen losartan (COZAAR) 25 MG tablet Take 1 tablet (25 mg total) by mouth 2 (two) times a day. 60 tablet 5  . metFORMIN (GLUCOPHAGE) 1000 MG tablet Take 1,000 mg by mouth 2 (two) times daily with a meal.    . Multiple Vitamins-Minerals (CENTRUM SILVER PO) Take 1 tablet by mouth daily. Reported on 02/10/2016    . simvastatin (ZOCOR) 20 MG tablet Take 20 mg by mouth every evening.    Marland Kitchen spironolactone (ALDACTONE) 25 MG tablet Take 1 tablet (25 mg total) by mouth every evening. 30 tablet 6  .  TRUE METRIX BLOOD GLUCOSE TEST test strip Use as directed.  5  . TRUEPLUS LANCETS 30G MISC Use as directed.  5  . UNIFINE PENTIPS 32G X 4 MM MISC      No current facility-administered medications for this visit.      Past Medical History:  Diagnosis Date  . CHF (congestive heart failure) ([place])   . Diabetes mellitus without complication ([place])   . Hypertension     ROS:   All systems reviewed and negative except as noted in the HPI.   Past Surgical History:  Procedure Laterality Date  . ABDOMINAL HYSTERECTOMY    . CARDIAC CATHETERIZATION     in [facility], clean, per pt.  Marland Kitchen RIGHT/LEFT HEART CATH AND CORONARY ANGIOGRAPHY N/A 11/17/2018   Procedure: RIGHT/LEFT HEART CATH AND CORONARY ANGIOGRAPHY;  Surgeon: Belva Crome, MD;  Location: [facility];  Service: Cardiovascular;  Laterality: N/A;     Family History  Problem Relation Age of Onset  . Diabetes Mellitus I Mother   . Lung cancer Mother   . Diabetes Mellitus I Father   . Sudden death Father 45  . Hypertension Brother   . Hypertension Brother   . Diabetes Mellitus I Brother   . Benign  prostatic hyperplasia Brother   . Heart failure Paternal Uncle      Social History   Socioeconomic History  . Marital status: Single    Spouse name: Not on file  . Number of children: Not on file  . Years of education: 59  . Highest education level: Bachelor's degree (e.g., BA, AB, BS)  Occupational History  . Occupation: Surveyor, quantity: [employer]  . Financial resource strain: Not very hard  . Food insecurity    Worry: Never true    Inability: Never true  . Transportation needs    Medical: No    Non-medical: No  Tobacco Use  . Smoking status: Never Smoker  . Smokeless tobacco: Never Used  Substance and Sexual Activity  . Alcohol use: Yes    Alcohol/week: 0.0 standard drinks    Comment: less than once a month  . Drug use: No  . Sexual activity: Never  Lifestyle  . Physical  activity    Days per week: 0 days    Minutes per session: 0 min  . Stress: To some extent  Relationships  . Social Herbalist on phone: Not on file    Gets together: Not on file    Attends religious service: Not on file    Active member of club or organization: Not on file    Attends meetings of clubs or organizations: Not on file    Relationship status: Not on file  . Intimate partner violence    Fear of current or ex partner: Not on file    Emotionally abused: Not on file    Physically abused: Not on file    Forced sexual activity: Not on file  Other Topics Concern  . Not on file  Social History Narrative   Works at [employer].  Lives alone.       BP 112/78   Pulse 86   Ht 5' 5.5" (1.664 m)   Wt 150 lb 9.6 oz (68.3 kg)   SpO2 97%   BMI 24.68 kg/m   Physical Exam:  Well appearing NAD HEENT: Unremarkable Neck:  No JVD, no thyromegally Lymphatics:  No adenopathy Back:  No CVA tenderness Lungs:  Clear HEART:  Regular rate rhythm, no murmurs, no rubs, no clicks Abd:  soft, positive bowel sounds, no organomegally, no rebound, no guarding Ext:  2 plus pulses, no edema, no cyanosis, no clubbing Skin:  No rashes no nodules Neuro:  CN II through XII intact, motor grossly intact  EKG - nsr  DEVICE  Normal device function.  See PaceArt for details.   Assess/Plan: 1. Chronic systolic heart failure - I discussed the indictions/risks/benefits/goals/expectations of ICD insertion with the patient and she will call us if she would like to proceed with ICD insertion.   Mikle Bosworth.D.

## 2019-06-04 NOTE — Patient Instructions (Addendum)
Medication Instructions:  Your physician recommends that you continue on your current medications as directed. Please refer to the Current Medication list given to you today.  Labwork: You will get lab work today:  CBC.  Testing/Procedures: Your physician has recommended that you have a defibrillator inserted. An implantable cardioverter defibrillator (ICD) is a small device that is placed in your chest or, in rare cases, your abdomen. This device uses electrical pulses or shocks to help control life-threatening, irregular heartbeats that could lead the heart to suddenly stop beating (sudden cardiac arrest). Leads are attached to the ICD that goes into your heart. This is done in the hospital and usually requires an overnight stay. Please see the instruction sheet given to you today for more information.   Follow-Up: You will follow up with device clinic 10-14 days after your procedure for a wound check.  You will follow up with Dr. Lovena Le 91 days after your procedure.    ICD instructions:  COVID TEST:  June 17, 2019 at 9:00 am at [facility]. [place].  This is a drive thru test.  You will pull up and they will approach your vehicle to provide the test. Self-quarantine after the test until the day of your procedure.  June 18, 2019--Please arrive to ADMITTING down the hall from the [place] main entrance of [facility] hospital at:  7:30 am  Use the CHG surgical scrub as directed  Do not eat or drink after midnight prior to procedure  On the morning of your procedure TAKE ONLY the following medications with a sip of water:  Carvedilol, digoxin, isosorbide-hydralazine and losartan  Plan for one night stay  You will need someone to drive you home at discharge  If you need a refill on your cardiac medications before your next appointment, please call your pharmacy.    Cardioverter Defibrillator Implantation  An implantable cardioverter defibrillator (ICD)  is a small device that is placed under the skin in the chest or abdomen. An ICD consists of a battery, a small computer (pulse generator), and wires (leads) that go into the heart. An ICD is used to detect and correct two types of dangerous irregular heartbeats (arrhythmias):  A rapid heart rhythm (tachycardia).  An arrhythmia in which the lower chambers of the heart (ventricles) contract in an uncoordinated way (fibrillation). When an ICD detects tachycardia, it sends a low-energy shock to the heart to restore the heartbeat to normal (cardioversion). This signal is usually painless. If cardioversion does not work or if the ICD detects fibrillation, it delivers a high-energy shock to the heart (defibrillation) to restart the heart. This shock may feel like a strong jolt in the chest. Your health care provider may prescribe an ICD if:  You have had an arrhythmia that originated in the ventricles.  Your heart has been damaged by a disease or heart condition. Sometimes, ICDs are programmed to act as a device called a pacemaker. Pacemakers can be used to treat a slow heartbeat (bradycardia) or tachycardia by taking over the heart rate with electrical impulses. Tell a health care provider about:  Any allergies you have.  All medicines you are taking, including vitamins, herbs, eye drops, creams, and over-the-counter medicines.  Any problems you or family members have had with anesthetic medicines.  Any blood disorders you have.  Any surgeries you have had.  Any medical conditions you have.  Whether you are pregnant or may be pregnant. What are the risks? Generally, this is  a safe procedure. However, problems may occur, including:  Swelling, bleeding, or bruising.  Infection.  Blood clots.  Damage to other structures or organs, such as nerves, blood vessels, or the heart.  Allergic reactions to medicines used during the procedure. What happens before the procedure? Staying  hydrated Follow instructions from your health care provider about hydration, which may include:  Up to 2 hours before the procedure - you may continue to drink clear liquids, such as water, clear fruit juice, black coffee, and plain tea. Eating and drinking restrictions Follow instructions from your health care provider about eating and drinking, which may include:  8 hours before the procedure - stop eating heavy meals or foods such as meat, fried foods, or fatty foods.  6 hours before the procedure - stop eating light meals or foods, such as toast or cereal.  6 hours before the procedure - stop drinking milk or drinks that contain milk.  2 hours before the procedure - stop drinking clear liquids. Medicine Ask your health care provider about:  Changing or stopping your normal medicines. This is important if you take diabetes medicines or blood thinners.  Taking medicines such as aspirin and ibuprofen. These medicines can thin your blood. Do not take these medicines before your procedure if your doctor tells you not to. Tests  You may have blood tests.  You may have a test to check the electrical signals in your heart (electrocardiogram, ECG).  You may have imaging tests, such as a chest X-ray. General instructions  For 24 hours before the procedure, stop using products that contain nicotine or tobacco, such as cigarettes and e-cigarettes. If you need help quitting, ask your health care provider.  Plan to have someone take you home from the hospital or clinic.  You may be asked to shower with a germ-killing soap. What happens during the procedure?  To reduce your risk of infection: ? Your health care team will wash or sanitize their hands. ? Your skin will be washed with soap. ? Hair may be removed from the surgical area.  Small monitors will be put on your body. They will be used to check your heart, blood pressure, and oxygen level.  An IV tube will be inserted into one  of your veins.  You will be given one or more of the following: ? A medicine to help you relax (sedative). ? A medicine to numb the area (local anesthetic). ? A medicine to make you fall asleep (general anesthetic).  Leads will be guided through a blood vessel into your heart and attached to your heart muscles. Depending on the ICD, the leads may go into one ventricle or they may go into both ventricles and into an upper chamber of the heart. An X-ray machine (fluoroscope) will be usedto help guide the leads.  A small incision will be made to create a deep pocket under your skin.  The pulse generator will be placed into the pocket.  The ICD will be tested.  The incision will be closed with stitches (sutures), skin glue, or staples.  A bandage (dressing) will be placed over the incision. This procedure may vary among health care providers and hospitals. What happens after the procedure?  Your blood pressure, heart rate, breathing rate, and blood oxygen level will be monitored often until the medicines you were given have worn off.  A chest X-ray will be taken to check that the ICD is in the right place.  You will need to  stay in the hospital for 1-2 days so your health care provider can make sure your ICD is working.  Do not drive for 24 hours if you received a sedative. Ask your health care provider when it is safe for you to drive.  You may be given an identification card explaining that you have an ICD. Summary  An implantable cardioverter defibrillator (ICD) is a small device that is placed under the skin in the chest or abdomen. It is used to detect and correct dangerous irregular heartbeats (arrhythmias).  An ICD consists of a battery, a small computer (pulse generator), and wires (leads) that go into the heart.  When an ICD detects rapid heart rhythm (tachycardia), it sends a low-energy shock to the heart to restore the heartbeat to normal (cardioversion). If cardioversion  does not work or if the ICD detects uncoordinated heart contractions (fibrillation), it delivers a high-energy shock to the heart (defibrillation) to restart the heart.  You will need to stay in the hospital for 1-2 days to make sure your ICD is working. This information is not intended to replace advice given to you by your health care provider. Make sure you discuss any questions you have with your health care provider. Document Released: 07/28/2002 Document Revised: 10/18/2017 Document Reviewed: 11/14/2016 Elsevier Patient Education  2020 [employer].

## 2019-06-04 NOTE — H&P (View-Only) (Signed)
HPI Toni [name] is referred today for evaluation of and consider for an ICD. She has a longstanding h/o a DCM. She has never had syncope. She does not have palpitations. She has been on maximal medical therapy. She denies peripheral edema. Her EF has been in the 15-20% range. She has non-obstructive CAD. Allergies  Allergen Reactions  . Biaxin [Clarithromycin]   . Enalapril Maleate Other (See Comments)    Lip swelling / angioedema  . Vasotec [Enalapril] Other (See Comments)    Lip swelling / angioedema Tolerates ARB     Current Outpatient Medications  Medication Sig Dispense Refill  . aspirin 81 MG chewable tablet Chew 1 tablet (81 mg total) by mouth daily. 30 tablet 0  . carvedilol (COREG) 25 MG tablet Take 0.5 tablets (12.5 mg total) by mouth 2 (two) times daily with a meal. 30 tablet 0  . dapagliflozin propanediol (FARXIGA) 5 MG TABS tablet Take 5 mg by mouth daily.    . digoxin (LANOXIN) 0.125 MG tablet Take 1 tablet (0.125 mg total) by mouth daily. 90 tablet 3  . furosemide (LASIX) 20 MG tablet TAKE 1 TABLET (20 MG TOTAL) BY MOUTH DAILY. 30 tablet 6  . glipiZIDE (GLUCOTROL XL) 10 MG 24 hr tablet Take 10 mg by mouth 2 (two) times daily with a meal.     . isosorbide-hydrALAZINE (BIDIL) 20-37.5 MG tablet Take 1 tablet by mouth 3 (three) times daily. 90 tablet 6  . LINZESS 145 MCG CAPS capsule Take 145 mcg by mouth daily.    . Liraglutide (VICTOZA [place]) Inject 1.8 mg into the skin every evening.     Marland Kitchen losartan (COZAAR) 25 MG tablet Take 1 tablet (25 mg total) by mouth 2 (two) times a day. 60 tablet 5  . metFORMIN (GLUCOPHAGE) 1000 MG tablet Take 1,000 mg by mouth 2 (two) times daily with a meal.    . Multiple Vitamins-Minerals (CENTRUM SILVER PO) Take 1 tablet by mouth daily. Reported on 02/10/2016    . simvastatin (ZOCOR) 20 MG tablet Take 20 mg by mouth every evening.    Marland Kitchen spironolactone (ALDACTONE) 25 MG tablet Take 1 tablet (25 mg total) by mouth every evening. 30 tablet 6  .  TRUE METRIX BLOOD GLUCOSE TEST test strip Use as directed.  5  . TRUEPLUS LANCETS 30G MISC Use as directed.  5  . UNIFINE PENTIPS 32G X 4 MM MISC      No current facility-administered medications for this visit.      Past Medical History:  Diagnosis Date  . CHF (congestive heart failure) ([place])   . Diabetes mellitus without complication ([place])   . Hypertension     ROS:   All systems reviewed and negative except as noted in the HPI.   Past Surgical History:  Procedure Laterality Date  . ABDOMINAL HYSTERECTOMY    . CARDIAC CATHETERIZATION     in [place], clean, per pt.  Marland Kitchen RIGHT/LEFT HEART CATH AND CORONARY ANGIOGRAPHY N/A 11/17/2018   Procedure: RIGHT/LEFT HEART CATH AND CORONARY ANGIOGRAPHY;  Surgeon: Belva Crome, MD;  Location: [facility];  Service: Cardiovascular;  Laterality: N/A;     Family History  Problem Relation Age of Onset  . Diabetes Mellitus I Mother   . Lung cancer Mother   . Diabetes Mellitus I Father   . Sudden death Father 28  . Hypertension Brother   . Hypertension Brother   . Diabetes Mellitus I Brother   . Benign  prostatic hyperplasia Brother   . Heart failure Paternal Uncle      Social History   Socioeconomic History  . Marital status: Single    Spouse name: Not on file  . Number of children: Not on file  . Years of education: 61  . Highest education level: Bachelor's degree (e.g., BA, AB, BS)  Occupational History  . Occupation: Surveyor, quantity: [employer]  . Financial resource strain: Not very hard  . Food insecurity    Worry: Never true    Inability: Never true  . Transportation needs    Medical: No    Non-medical: No  Tobacco Use  . Smoking status: Never Smoker  . Smokeless tobacco: Never Used  Substance and Sexual Activity  . Alcohol use: Yes    Alcohol/week: 0.0 standard drinks    Comment: less than once a month  . Drug use: No  . Sexual activity: Never  Lifestyle  . Physical  activity    Days per week: 0 days    Minutes per session: 0 min  . Stress: To some extent  Relationships  . Social Herbalist on phone: Not on file    Gets together: Not on file    Attends religious service: Not on file    Active member of club or organization: Not on file    Attends meetings of clubs or organizations: Not on file    Relationship status: Not on file  . Intimate partner violence    Fear of current or ex partner: Not on file    Emotionally abused: Not on file    Physically abused: Not on file    Forced sexual activity: Not on file  Other Topics Concern  . Not on file  Social History Narrative   Works at [employer].  Lives alone.       BP 112/78   Pulse 86   Ht 5' 5.5" (1.664 m)   Wt 150 lb 9.6 oz (68.3 kg)   SpO2 97%   BMI 24.68 kg/m   Physical Exam:  Well appearing NAD HEENT: Unremarkable Neck:  No JVD, no thyromegally Lymphatics:  No adenopathy Back:  No CVA tenderness Lungs:  Clear HEART:  Regular rate rhythm, no murmurs, no rubs, no clicks Abd:  soft, positive bowel sounds, no organomegally, no rebound, no guarding Ext:  2 plus pulses, no edema, no cyanosis, no clubbing Skin:  No rashes no nodules Neuro:  CN II through XII intact, motor grossly intact  EKG - nsr  DEVICE  Normal device function.  See PaceArt for details.   Assess/Plan: 1. Chronic systolic heart failure - I discussed the indictions/risks/benefits/goals/expectations of ICD insertion with the patient and she will call us if she would like to proceed with ICD insertion.   Mikle Bosworth.D.

## 2019-06-09 ENCOUNTER — Encounter: Payer: Self-pay | Admitting: Physical Therapy

## 2019-06-09 ENCOUNTER — Ambulatory Visit: Payer: No Typology Code available for payment source | Admitting: Physical Therapy

## 2019-06-09 ENCOUNTER — Telehealth: Payer: Self-pay

## 2019-06-09 ENCOUNTER — Other Ambulatory Visit: Payer: Self-pay

## 2019-06-09 DIAGNOSIS — G8929 Other chronic pain: Secondary | ICD-10-CM

## 2019-06-09 DIAGNOSIS — R293 Abnormal posture: Secondary | ICD-10-CM

## 2019-06-09 DIAGNOSIS — M25612 Stiffness of left shoulder, not elsewhere classified: Secondary | ICD-10-CM | POA: Diagnosis not present

## 2019-06-09 DIAGNOSIS — M6281 Muscle weakness (generalized): Secondary | ICD-10-CM

## 2019-06-09 DIAGNOSIS — M25512 Pain in left shoulder: Secondary | ICD-10-CM

## 2019-06-09 NOTE — Telephone Encounter (Signed)
Call placed to Pt.  Advised Covid testing site had moved to [facility].  Rescheduled covid test for 06/15/2019  Pt indicates understanding.

## 2019-06-09 NOTE — Therapy (Signed)
Toni [name], [place], [address] Phone: [phone]   Fax:  [phone]  Physical Therapy Treatment  Patient Details  Name: Toni [name] MRN: [id number] Date of Birth: 03-07-1956 Referring Provider (PT): Joni Fears MD   Encounter Date: 06/09/2019  PT End of Session - 06/09/19 0912    Visit Number  4    Number of Visits  17    Date for PT Re-Evaluation  07/13/19    Authorization Type  [facility] U[facility]    PT Start Time  0913    PT Stop Time  0951    PT Time Calculation (min)  38 min    Activity Tolerance  Patient tolerated treatment well       Past Medical History:  Diagnosis Date  . CHF (congestive heart failure) ([place])   . Diabetes mellitus without complication ([place])   . Hypertension     Past Surgical History:  Procedure Laterality Date  . ABDOMINAL HYSTERECTOMY    . CARDIAC CATHETERIZATION     in [facility], clean, per pt.  Marland Kitchen RIGHT/LEFT HEART CATH AND CORONARY ANGIOGRAPHY N/A 11/17/2018   Procedure: RIGHT/LEFT HEART CATH AND CORONARY ANGIOGRAPHY;  Surgeon: Belva Crome, MD;  Location: [facility];  Service: Cardiovascular;  Laterality: N/A;    There were no vitals filed for this visit.  Subjective Assessment - 06/09/19 0913    Subjective  "I am doing pretty good today, no pain"    Patient Stated Goals  to be able to use the L arm, decrease pain    Currently in Pain?  No/denies                       [facility] Adult PT Treatment/Exercise - 06/09/19 0001      Elbow Exercises   Elbow Flexion  Strengthening;Left;10 reps;Seated   4#     Shoulder Exercises: Supine   Other Supine Exercises  reaching behind the back 1 x 10 with towel. 2nd set modifying postiong to pulling LUE up the back.      Shoulder Exercises: Standing   Internal Rotation  15 reps;Theraband    Theraband Level (Shoulder Internal Rotation)  Level 2 (Red)    Extension  15 reps;Theraband    Theraband Level  (Shoulder Extension)  Level 2 (Red)    Row  Strengthening;15 reps;Theraband    Theraband Level (Shoulder Row)  Level 3 (Green)    Other Standing Exercises  shoulder flexion reaching into bottom 2 shelves of cupboard 2#  2 x 5 ea. level      Shoulder Exercises: Pulleys   Flexion  2 minutes    Scaption  2 minutes      Shoulder Exercises: ROM/Strengthening   UBE (Upper Arm Bike)  L1 x 5 min    changing directation at 2:30            PT Education - 06/09/19 0943    Education Details  updated HEP for modified towel IR reaching up the back    Person(s) Educated  Patient    Methods  Explanation;Verbal cues;Handout    Comprehension  Verbalized understanding;Verbal cues required       PT Short Term Goals - 05/26/19 2018      PT SHORT TERM GOAL #1   Title  pt to be I with inital HEP    Time  4    Status  On-going      PT SHORT TERM  GOAL #2   Title  pt to verbalize proper posture and lifting mechanics to reduce and prevent shoulder pain    Time  4    Period  Weeks    Status  On-going    Target Date  06/15/19      PT SHORT TERM GOAL #3   Title  increase L shoulder flexion/ abduction by >/= 8 degrees with </= 4/10 pain for functional ROM progression    Time  4    Period  Weeks    Status  On-going      PT SHORT TERM GOAL #4   Title  increase L grip strength by >/= 10# to demo improving  L shoulder function    Time  4    Period  Weeks    Status  On-going        PT Long Term Goals - 05/18/19 0936      PT LONG TERM GOAL #1   Title  increase L shoulder flexion/ abduction to >/= 130 degrees and ER/IR to [facility] compard bil with </= 2/10 pain for functional ROM for ADLS and work related tasks    Time  8    Period  Weeks    Status  New    Target Date  07/13/19      PT LONG TERM GOAL #2   Title  increase L shoulder strength to >/= 4/5 in all planes to promote scapulohumeral stability    Time  8    Period  Weeks    Status  New    Target Date  07/13/19      PT LONG TERM  GOAL #3   Title  pt to be able to lift/ lower >/= 8# to and from and overhead shelf and push/ pull >/=10# with LUE reporting </= 2/10 pain for functional strength required for work related activities    Time  8    Period  Weeks    Status  New    Target Date  07/13/19      PT LONG TERM GOAL #4   Title  increase FOTO score to </=36% limited to demo improvement in function    Time  8    Period  Weeks    Status  New    Target Date  07/13/19      PT LONG TERM GOAL #5   Title  pt to be I with all HEP given as of last visit to maintain and progress current level of function    Time  8    Period  Weeks    Status  New    Target Date  07/13/19            Plan - 06/09/19 0949    Clinical Impression Statement  pt continues to improve with shoulder ROM and reports no pain today. Due to progressing ROM and no pain focused session on shoulder strengthening which she responded well to, but did not she was getting fatgiued and dizzy requiring a short sitting rest break, she reported it was likely due to exercise while wearing a mask. End of session she continued to report no pain and declined modalities.    PT Treatment/Interventions  ADLs/Self Care Home Management;Cryotherapy;Electrical Stimulation;Iontophoresis 4mg /ml Dexamethasone;Moist Heat;Ultrasound;Functional mobility training;Therapeutic activities;Therapeutic exercise;Patient/family education;Neuromuscular re-education;Manual techniques;Passive range of motion;Dry needling;Taping    PT Next Visit Plan  update HEP, shoulder mobs, AROM,  scapular stability strengthening, modalities for pain    PT Home Exercise Plan  wand  AAROM flexion/ abduction/ ER, upper trap stretch, self ER PROM, towle IR pulling horizontally, and vertically    Consulted and Agree with Plan of Care  Patient       Patient will benefit from skilled therapeutic intervention in order to improve the following deficits and impairments:  Improper body mechanics, Abnormal  gait, Decreased activity tolerance, Impaired UE functional use, Increased fascial restricitons, Decreased endurance, Decreased strength, Increased muscle spasms, Postural dysfunction, Pain, Decreased range of motion  Visit Diagnosis: 1. Stiffness of left shoulder, not elsewhere classified   2. Chronic left shoulder pain   3. Muscle weakness (generalized)   4. Abnormal posture        Problem List Patient Active Problem List   Diagnosis Date Noted  . Nonischemic cardiomyopathy ([place]) 06/04/2019  . Hypomagnesemia 11/18/2018  . Hypokalemia 11/15/2018  . Diabetes mellitus without complication ([place])   . Coronary artery disease   . Acute on chronic systolic CHF (congestive heart failure) ([place]) 01/04/2016  . Hyperlipidemia 01/03/2016  . Cardiomegaly - hypertensive 01/03/2016  . DM (diabetes mellitus), type 2 with complications ([place]) 52/06/222  . Hypertension 01/03/2014   Starr Lake PT, DPT, LAT, ATC  06/09/19  9:54 AM      [facility] [address] [place], [place], [address] Phone: [phone]   Fax:  [phone]  Name: Toni [name] MRN: [id number] Date of Birth: 1956-07-24

## 2019-06-11 ENCOUNTER — Other Ambulatory Visit: Payer: Self-pay

## 2019-06-11 ENCOUNTER — Ambulatory Visit: Payer: No Typology Code available for payment source | Admitting: Physical Therapy

## 2019-06-11 DIAGNOSIS — R293 Abnormal posture: Secondary | ICD-10-CM

## 2019-06-11 DIAGNOSIS — G8929 Other chronic pain: Secondary | ICD-10-CM

## 2019-06-11 DIAGNOSIS — M6281 Muscle weakness (generalized): Secondary | ICD-10-CM

## 2019-06-11 DIAGNOSIS — M25512 Pain in left shoulder: Secondary | ICD-10-CM

## 2019-06-11 DIAGNOSIS — M25612 Stiffness of left shoulder, not elsewhere classified: Secondary | ICD-10-CM

## 2019-06-11 MED FILL — LOSARTAN POTASSIUM 25 MG TA: 25 | 30 days supply | Qty: 60 | Fill #2

## 2019-06-11 NOTE — Therapy (Signed)
[place], [place], [address] Phone: [phone]   Fax:  [phone]  Physical Therapy Treatment / Discharge Summary  Patient Details  Name: Toni [name] MRN: [id number] Date of Birth: 1956/01/03 Referring Provider (PT): Joni Fears MD   Encounter Date: 06/11/2019  PT End of Session - 06/11/19 0912    Visit Number  5    Number of Visits  17    Date for PT Re-Evaluation  07/13/19    Authorization Type  [facility] UMR    PT Start Time  0845    PT Stop Time  0924    PT Time Calculation (min)  39 min    Activity Tolerance  Patient tolerated treatment well    Behavior During Therapy  [facility] for tasks assessed/performed       Past Medical History:  Diagnosis Date  . CHF (congestive heart failure) ([place])   . Diabetes mellitus without complication ([place])   . Hypertension     Past Surgical History:  Procedure Laterality Date  . ABDOMINAL HYSTERECTOMY    . CARDIAC CATHETERIZATION     in [facility], clean, per pt.  Marland Kitchen RIGHT/LEFT HEART CATH AND CORONARY ANGIOGRAPHY N/A 11/17/2018   Procedure: RIGHT/LEFT HEART CATH AND CORONARY ANGIOGRAPHY;  Surgeon: Belva Crome, MD;  Location: [facility];  Service: Cardiovascular;  Laterality: N/A;    There were no vitals filed for this visit.  Subjective Assessment - 06/11/19 0851    Subjective  "I am still doing good, some sorneess this morning"    Currently in Pain?  Yes    Pain Score  1    last took meds for pain at 7am   Pain Location  Shoulder    Pain Orientation  Left    Pain Descriptors / Indicators  Aching    Pain Type  Chronic pain    Aggravating Factors   laying down, following exercise    Pain Relieving Factors  ice pack         [facility] PT Assessment - 06/11/19 0001      Assessment   Medical Diagnosis  Chronic left shoulder pain     Referring Provider (PT)  Joni Fears MD      Observation/Other Assessments   Focus on Therapeutic Outcomes  (FOTO)   39% limited      AROM   Left Shoulder Extension  57 Degrees    Left Shoulder Flexion  142 Degrees    Left Shoulder ABduction  101 Degrees    Left Shoulder Internal Rotation  --   T11   Left Shoulder External Rotation  --   T5     Strength   Left Shoulder Flexion  4-/5    Left Shoulder Extension  4-/5    Left Shoulder ABduction  4-/5    Left Shoulder Internal Rotation  4/5    Left Shoulder External Rotation  4/5                   [facility] Adult PT Treatment/Exercise - 06/11/19 0001      Self-Care   Self-Care  Posture    Posture  posture during sleeping and while laying down utilizing pillow and propping.      Shoulder Exercises: Seated   Row  10 reps;Strengthening;Theraband;Both    Theraband Level (Shoulder Row)  Level 2 (Red)    Horizontal ABduction  Strengthening;10 reps    Theraband Level (Shoulder Horizontal ABduction)  Level 2 (  Red)    External Rotation  Strengthening;10 reps    Theraband Level (Shoulder External Rotation)  Level 2 (Red)    Internal Rotation  10 reps;Theraband    Theraband Level (Shoulder Internal Rotation)  Level 2 (Red)      Shoulder Exercises: ROM/Strengthening   Nustep  L5 x 6 min UE/LE      Shoulder Exercises: Stretch   Other Shoulder Stretches  upper trap 2 x 30 sec              PT Education - 06/11/19 4650    Education Details  reviewed previusly provided HEP, updated HEP For strengtheing progression providing additional progressive resistance bands. Discussed importance of consistency to promote functional progression.    Person(s) Educated  Patient    Methods  Explanation;Verbal cues;Handout    Comprehension  Verbalized understanding       PT Short Term Goals - 06/11/19 0913      PT SHORT TERM GOAL #1   Title  pt to be I with inital HEP    Status  Achieved      PT SHORT TERM GOAL #2   Title  pt to verbalize proper posture and lifting mechanics to reduce and prevent shoulder pain    Period  Weeks     Status  Achieved      PT SHORT TERM GOAL #3   Title  increase L shoulder flexion/ abduction by >/= 8 degrees with </= 4/10 pain for functional ROM progression    Period  Weeks        PT Long Term Goals - 06/11/19 3546      PT LONG TERM GOAL #1   Title  increase L shoulder flexion/ abduction to >/= 130 degrees and ER/IR to [facility] compard bil with </= 2/10 pain for functional ROM for ADLS and work related tasks    Period  Weeks    Status  Partially Met      PT LONG TERM GOAL #2   Title  increase L shoulder strength to >/= 4/5 in all planes to promote scapulohumeral stability    Period  Weeks    Status  Partially Met      PT LONG TERM GOAL #3   Title  pt to be able to lift/ lower >/= 8# to and from and overhead shelf and push/ pull >/=10# with LUE reporting </= 2/10 pain for functional strength required for work related activities    Period  Weeks    Status  Partially Met      PT LONG TERM GOAL #4   Title  increase FOTO score to </=36% limited to demo improvement in function    Period  Weeks    Status  Partially Met      PT LONG TERM GOAL #5   Title  pt to be I with all HEP given as of last visit to maintain and progress current level of function    Time  8    Period  Weeks    Status  Partially Met            Plan - 06/11/19 0936    Clinical Impression Statement  She reports today is her last day due to having to undergo a ICD procedure. Toni [name] has made great progress with physical therapy increasing shoulder ROM and strength. . she has demonstrates mild limitation of L shoulder abduction compared bil.she was able to do all exercises, she met or partially met all goals today  and will be discharged from PT.    PT Treatment/Interventions  ADLs/Self Care Home Management;Cryotherapy;Electrical Stimulation;Iontophoresis 52m/ml Dexamethasone;Moist Heat;Ultrasound;Functional mobility training;Therapeutic activities;Therapeutic exercise;Patient/family education;Neuromuscular  re-education;Manual techniques;Passive range of motion;Dry needling;Taping    PT Next Visit Plan  update HEP, shoulder mobs, AROM,  scapular stability strengthening, modalities for pain    PT Home Exercise Plan  wand AAROM flexion/ abduction/ ER, upper trap stretch, self ER PROM, towle IR pulling horizontally, and vertically    Consulted and Agree with Plan of Care  Patient       Patient will benefit from skilled therapeutic intervention in order to improve the following deficits and impairments:  Improper body mechanics, Abnormal gait, Decreased activity tolerance, Impaired UE functional use, Increased fascial restricitons, Decreased endurance, Decreased strength, Increased muscle spasms, Postural dysfunction, Pain, Decreased range of motion  Visit Diagnosis: 1. Stiffness of left shoulder, not elsewhere classified   2. Chronic left shoulder pain   3. Muscle weakness (generalized)   4. Abnormal posture        Problem List Patient Active Problem List   Diagnosis Date Noted  . Nonischemic cardiomyopathy ([place] 06/04/2019  . Hypomagnesemia 11/18/2018  . Hypokalemia 11/15/2018  . Diabetes mellitus without complication ([place]   . Coronary artery disease   . Acute on chronic systolic CHF (congestive heart failure) ([place] 01/04/2016  . Hyperlipidemia 01/03/2016  . Cardiomegaly - hypertensive 01/03/2016  . DM (diabetes mellitus), type 2 with complications ([place] 083/81/8403 . Hypertension 01/03/2014    LStarr Lake7/23/2020, 9:42 AM  [facility] [address]Phone: [phone]  Fax:  [phone] Name: SCARMELINE KOWALMRN: [id number] of Birth: [birth year]       PHYSICAL THERAPY DISCHARGE SUMMARY  Visits from Start of Care: 5  Current functional level related to goals / functional outcomes: See goals, FOTO 39% limited   Remaining deficits: Mild limitations with L shoulder ROM and  weakness, and intermittent pain rated at max 4/10.    Education / Equipment: HEP, theraband, posture,   Plan: Patient agrees to discharge.  Patient goals were partially met. Patient is being discharged due to the patient's request.  ?????          Tywaun Hiltner PT, DPT, LAT, ATC  06/11/19  9:42 AM

## 2019-06-15 ENCOUNTER — Other Ambulatory Visit (HOSPITAL_COMMUNITY)
Admission: RE | Admit: 2019-06-15 | Discharge: 2019-06-15 | Disposition: A | Discharge status: Home / Self Care | Payer: No Typology Code available for payment source | Source: Ambulatory Visit | Attending: Internal Medicine | Admitting: Internal Medicine

## 2019-06-15 DIAGNOSIS — Z20828 Contact with and (suspected) exposure to other viral communicable diseases: Secondary | ICD-10-CM | POA: Diagnosis present

## 2019-06-15 LAB — SARS CORONAVIRUS 2 (TAT 6-24 HRS): SARS Coronavirus 2: NEGATIVE

## 2019-06-15 MED FILL — DIGOXIN 0.125 MG TABLET: 125 | 90 days supply | Qty: 90 | Fill #0

## 2019-06-16 ENCOUNTER — Ambulatory Visit: Payer: No Typology Code available for payment source | Admitting: Physical Therapy

## 2019-06-16 MED FILL — metFORMIN HCL 1000 MG TABS: 1000 | 90 days supply | Qty: 180 | Fill #0

## 2019-06-17 ENCOUNTER — Other Ambulatory Visit (HOSPITAL_COMMUNITY): Payer: No Typology Code available for payment source

## 2019-06-18 ENCOUNTER — Ambulatory Visit (HOSPITAL_COMMUNITY): Payer: No Typology Code available for payment source

## 2019-06-18 ENCOUNTER — Ambulatory Visit (HOSPITAL_COMMUNITY)
Admission: RE | Admit: 2019-06-18 | Discharge: 2019-06-18 | Disposition: A | Discharge status: Home / Self Care | Payer: No Typology Code available for payment source | Attending: Internal Medicine | Admitting: Internal Medicine

## 2019-06-18 ENCOUNTER — Ambulatory Visit (HOSPITAL_COMMUNITY)
Admission: RE | Disposition: A | Discharge status: Home / Self Care | Payer: No Typology Code available for payment source | Source: Home / Self Care | Attending: Internal Medicine

## 2019-06-18 ENCOUNTER — Other Ambulatory Visit: Payer: Self-pay

## 2019-06-18 ENCOUNTER — Ambulatory Visit: Payer: No Typology Code available for payment source | Admitting: Physical Therapy

## 2019-06-18 DIAGNOSIS — I428 Other cardiomyopathies: Secondary | ICD-10-CM | POA: Diagnosis not present

## 2019-06-18 DIAGNOSIS — Z90710 Acquired absence of both cervix and uterus: Secondary | ICD-10-CM | POA: Diagnosis not present

## 2019-06-18 DIAGNOSIS — Z833 Family history of diabetes mellitus: Secondary | ICD-10-CM | POA: Diagnosis not present

## 2019-06-18 DIAGNOSIS — I5032 Chronic diastolic (congestive) heart failure: Secondary | ICD-10-CM | POA: Insufficient documentation

## 2019-06-18 DIAGNOSIS — Z7982 Long term (current) use of aspirin: Secondary | ICD-10-CM | POA: Insufficient documentation

## 2019-06-18 DIAGNOSIS — I5023 Acute on chronic systolic (congestive) heart failure: Secondary | ICD-10-CM | POA: Diagnosis present

## 2019-06-18 DIAGNOSIS — I110 Hypertensive heart disease with heart failure: Secondary | ICD-10-CM | POA: Insufficient documentation

## 2019-06-18 DIAGNOSIS — Z7984 Long term (current) use of oral hypoglycemic drugs: Secondary | ICD-10-CM | POA: Insufficient documentation

## 2019-06-18 DIAGNOSIS — Z006 Encounter for examination for normal comparison and control in clinical research program: Secondary | ICD-10-CM | POA: Insufficient documentation

## 2019-06-18 DIAGNOSIS — Z881 Allergy status to other antibiotic agents status: Secondary | ICD-10-CM | POA: Diagnosis not present

## 2019-06-18 DIAGNOSIS — Z79899 Other long term (current) drug therapy: Secondary | ICD-10-CM | POA: Insufficient documentation

## 2019-06-18 DIAGNOSIS — E119 Type 2 diabetes mellitus without complications: Secondary | ICD-10-CM | POA: Diagnosis not present

## 2019-06-18 DIAGNOSIS — Z8249 Family history of ischemic heart disease and other diseases of the circulatory system: Secondary | ICD-10-CM | POA: Insufficient documentation

## 2019-06-18 DIAGNOSIS — Z95810 Presence of automatic (implantable) cardiac defibrillator: Secondary | ICD-10-CM

## 2019-06-18 DIAGNOSIS — Z888 Allergy status to other drugs, medicaments and biological substances status: Secondary | ICD-10-CM | POA: Diagnosis not present

## 2019-06-18 HISTORY — PX: ICD IMPLANT: EP1208

## 2019-06-18 LAB — BASIC METABOLIC PANEL
Anion gap: 9 (ref 5–15)
BUN: 14 mg/dL (ref 8–23)
CO2: 27 mmol/L (ref 22–32)
Calcium: 9.5 mg/dL (ref 8.9–10.3)
Chloride: 102 mmol/L (ref 98–111)
Creatinine, Ser: 0.74 mg/dL (ref 0.44–1.00)
GFR calc Af Amer: >60 mL/min (ref >60)
GFR calc non Af Amer: >60 mL/min (ref >60)
Glucose, Bld: 164 mg/dL — ABNORMAL HIGH (ref 70–99)
Potassium: 4.1 mmol/L (ref 3.5–5.1)
Sodium: 138 mmol/L (ref 135–145)

## 2019-06-18 LAB — GLUCOSE, CAPILLARY
Glucose-Capillary: 133 mg/dL — ABNORMAL HIGH (ref 70–99)
Glucose-Capillary: 90 mg/dL (ref 70–99)

## 2019-06-18 LAB — SURGICAL PCR SCREEN
MRSA, PCR: NEGATIVE
Staphylococcus aureus: NEGATIVE

## 2019-06-18 SURGERY — ICD IMPLANT

## 2019-06-18 MED ORDER — LIDOCAINE HCL 1 % IJ SOLN
INTRAMUSCULAR | Status: AC
  Filled 2019-06-18: qty 20

## 2019-06-18 MED ORDER — CEFAZOLIN SODIUM-DEXTROSE 2-4 GM/100ML-% IV SOLN: 2.0000 g | INTRAVENOUS | Status: DC

## 2019-06-18 MED ORDER — CEFAZOLIN SODIUM-DEXTROSE 2-4 GM/100ML-% IV SOLN
INTRAVENOUS | Status: AC
  Filled 2019-06-18: qty 100

## 2019-06-18 MED ORDER — MUPIROCIN 2 % EX OINT
1.0000 "application " | TOPICAL_OINTMENT | Freq: Once | CUTANEOUS | Status: AC
  Administered 2019-06-18: 1 via TOPICAL
  Filled 2019-06-18: qty 22

## 2019-06-18 MED ORDER — CEFAZOLIN SODIUM-DEXTROSE 2-3 GM-%(50ML) IV SOLR
INTRAVENOUS | Status: AC | PRN
  Administered 2019-06-18: 2 g via INTRAVENOUS

## 2019-06-18 MED ORDER — MIDAZOLAM HCL 5 MG/5ML IJ SOLN
INTRAMUSCULAR | Status: AC
  Filled 2019-06-18: qty 5

## 2019-06-18 MED ORDER — FENTANYL CITRATE (PF) 100 MCG/2ML IJ SOLN
INTRAMUSCULAR | Status: AC
  Filled 2019-06-18: qty 2

## 2019-06-18 MED ORDER — MIDAZOLAM HCL 5 MG/5ML IJ SOLN
INTRAMUSCULAR | Status: DC | PRN
  Administered 2019-06-18 (×3): 1 mg via INTRAVENOUS

## 2019-06-18 MED ORDER — CEFAZOLIN SODIUM-DEXTROSE 1-4 GM/50ML-% IV SOLN: 1.0000 g | Freq: Once | INTRAVENOUS | Status: DC

## 2019-06-18 MED ORDER — FENTANYL CITRATE (PF) 100 MCG/2ML IJ SOLN
INTRAMUSCULAR | Status: DC | PRN
  Administered 2019-06-18: 25 ug via INTRAVENOUS
  Administered 2019-06-18: 12.5 ug via INTRAVENOUS

## 2019-06-18 MED ORDER — CHLORHEXIDINE GLUCONATE 4 % EX LIQD: 60.0000 mL | Freq: Once | CUTANEOUS | Status: DC

## 2019-06-18 MED ORDER — SODIUM CHLORIDE 0.9 % IV SOLN
INTRAVENOUS | Status: DC
  Administered 2019-06-18: 09:00:00 via INTRAVENOUS

## 2019-06-18 MED ORDER — ACETAMINOPHEN 325 MG PO TABS: 325.0000 mg | ORAL_TABLET | ORAL | Status: DC | PRN

## 2019-06-18 MED ORDER — LIDOCAINE HCL (PF) 1 % IJ SOLN
INTRAMUSCULAR | Status: DC | PRN
  Administered 2019-06-18: 30 mL

## 2019-06-18 MED ORDER — SODIUM CHLORIDE 0.9 % IV SOLN
INTRAVENOUS | Status: AC
  Filled 2019-06-18: qty 2

## 2019-06-18 MED ORDER — ONDANSETRON HCL 4 MG/2ML IJ SOLN: 4.0000 mg | Freq: Four times a day (QID) | INTRAMUSCULAR | Status: DC | PRN

## 2019-06-18 MED ORDER — SODIUM CHLORIDE 0.9 % IV SOLN: 80.0000 mg | INTRAVENOUS | Status: DC

## 2019-06-18 MED ORDER — HEPARIN (PORCINE) IN NACL 1000-0.9 UT/500ML-% IV SOLN
INTRAVENOUS | Status: AC
  Filled 2019-06-18: qty 500

## 2019-06-18 MED ORDER — LIDOCAINE HCL (PF) 1 % IJ SOLN
INTRAMUSCULAR | Status: AC
  Filled 2019-06-18: qty 60

## 2019-06-18 SURGICAL SUPPLY — 6 items
CABLE SURGICAL S-101-97-12 (CABLE) ×2 IMPLANT
ICD VIGILANT VR D232 (Pacemaker) ×1 IMPLANT
LEAD RELIANCE G DF4 0292 (Lead) ×1 IMPLANT
PAD PRO RADIOLUCENT 2001M-C (PAD) ×2 IMPLANT
SHEATH 9FR PRELUDE SNAP 13 (SHEATH) ×1 IMPLANT
TRAY PACEMAKER INSERTION (PACKS) ×2 IMPLANT

## 2019-06-18 NOTE — Interval H&P Note (Signed)
History and Physical Interval Note:  06/18/2019 12:44 PM  Toni [name]  has presented today for surgery, with the diagnosis of Cardiomyopathy.  The various methods of treatment have been discussed with the patient and family. After consideration of risks, benefits and other options for treatment, the patient has consented to  Procedure(s): ICD IMPLANT (N/A) as a surgical intervention.  The patient's history has been reviewed, patient examined, no change in status, stable for surgery.  I have reviewed the patient's chart and labs.  Questions were answered to the patient's satisfaction.     Cristopher Peru

## 2019-06-18 NOTE — Discharge Instructions (Signed)
06/19/2019, PLEASE SEND A REMOTE DEVICE TRANSMISSION        Supplemental Discharge Instructions for  Pacemaker/Defibrillator Patients  Activity No heavy lifting or vigorous activity with your left/right arm for 6 to 8 weeks.  Do not raise your left/right arm above your head for one week.  Gradually raise your affected arm as drawn below.              06/22/2019                  06/23/2019                  06/24/2019                 06/25/2019 __  NO DRIVING for  1 week   ; you may begin driving on  05/20/6202   .  WOUND CARE - Keep the wound area clean and dry.  Do not get this area wet for one week. No showers for one week; you may shower on 06/25/2019  . - 06/19/2019 remove the arm sling - 06/19/2019 remove the outer plastic dressing, the paper tapes (steri-strips) remain in place, do not remove them - The tape/steri-strips on your wound will fall off; do not pull them off.  No bandage is needed on the site.  DO  NOT apply any creams, oils, or ointments to the wound area. - If you notice any drainage or discharge from the wound, any swelling or bruising at the site, or you develop a fever > 101? F after you are discharged home, call the office at once.  Special Instructions - You are still able to use cellular telephones; use the ear opposite the side where you have your pacemaker/defibrillator.  Avoid carrying your cellular phone near your device. - When traveling through airports, show security personnel your identification card to avoid being screened in the metal detectors.  Ask the security personnel to use the hand wand. - Avoid arc welding equipment, MRI testing (magnetic resonance imaging), TENS units (transcutaneous nerve stimulators).  Call the office for questions about other devices. - Avoid electrical appliances that are in poor condition or are not properly grounded. - Microwave ovens are safe to be near or to operate.  Additional information for defibrillator patients should your  device go off: - If your device goes off ONCE and you feel fine afterward, notify the device clinic nurses. - If your device goes off ONCE and you do not feel well afterward, call 911. - If your device goes off TWICE, call 911. - If your device goes off THREE times in one day, call 911.  DO NOT DRIVE YOURSELF OR A FAMILY MEMBER WITH A DEFIBRILLATOR TO THE HOSPITAL--CALL 911.

## 2019-06-19 ENCOUNTER — Encounter (HOSPITAL_COMMUNITY): Payer: Self-pay | Admitting: Internal Medicine

## 2019-06-19 ENCOUNTER — Telehealth: Payer: Self-pay

## 2019-06-19 MED FILL — Heparin Sod (Porcine)-NaCl IV Soln 1000 Unit/500ML-0.9%: INTRAVENOUS | Qty: 500 | Status: AC

## 2019-06-19 MED FILL — Lidocaine HCl Local Inj 1%: INTRAMUSCULAR | Qty: 40 | Status: AC

## 2019-06-19 MED FILL — Gentamicin Sulfate Inj 40 MG/ML: INTRAMUSCULAR | Qty: 80 | Status: AC

## 2019-06-19 NOTE — Telephone Encounter (Signed)
I spoke with the pt and she states that [facility] told her to wait until tonight to send her transmission. I told her I would look for the transmission on Monday and if I do not see it I will give her a call back. The pt verbalized understanding.

## 2019-06-19 NOTE — Telephone Encounter (Signed)
-----   Message from [place], [place] sent at 06/18/2019  2:26 PM EDT ----- Same day discharge, please follow up on next day transmission  Thanks renee

## 2019-06-22 NOTE — Telephone Encounter (Signed)
I spoke with the pt and asked her to send a manual transmission. Transmission received.

## 2019-06-22 NOTE — Telephone Encounter (Signed)
Transmission reviewed. RV sensing and impedance trends stable since discharge. RV threshold test is not programmed to run automatically. No episodes. Presenting rhythm Vs @ 103bpm. Histograms appropriate.  Spoke with patient. Advised her of normal transmission. Pt denies any questions about discharge instructions. Reports she is feeling well, managing post-implant pain with Tylenol. Aware to call back for questions or concerns prior to wound check.

## 2019-06-29 ENCOUNTER — Encounter (HOSPITAL_COMMUNITY): Payer: Self-pay | Admitting: Cardiology

## 2019-06-29 ENCOUNTER — Ambulatory Visit (HOSPITAL_COMMUNITY)
Admission: RE | Admit: 2019-06-29 | Discharge: 2019-06-29 | Disposition: A | Discharge status: Home / Self Care | Payer: No Typology Code available for payment source | Source: Ambulatory Visit | Attending: Cardiology | Admitting: Cardiology

## 2019-06-29 ENCOUNTER — Other Ambulatory Visit (HOSPITAL_COMMUNITY): Payer: Self-pay | Admitting: Cardiology

## 2019-06-29 ENCOUNTER — Telehealth: Payer: Self-pay

## 2019-06-29 ENCOUNTER — Other Ambulatory Visit: Payer: Self-pay

## 2019-06-29 VITALS — BP 120/76 | HR 87 | Wt 151.2 lb

## 2019-06-29 DIAGNOSIS — Z7982 Long term (current) use of aspirin: Secondary | ICD-10-CM | POA: Insufficient documentation

## 2019-06-29 DIAGNOSIS — Z79899 Other long term (current) drug therapy: Secondary | ICD-10-CM | POA: Insufficient documentation

## 2019-06-29 DIAGNOSIS — E785 Hyperlipidemia, unspecified: Secondary | ICD-10-CM | POA: Diagnosis not present

## 2019-06-29 DIAGNOSIS — Z95810 Presence of automatic (implantable) cardiac defibrillator: Secondary | ICD-10-CM | POA: Diagnosis not present

## 2019-06-29 DIAGNOSIS — I110 Hypertensive heart disease with heart failure: Secondary | ICD-10-CM | POA: Diagnosis not present

## 2019-06-29 DIAGNOSIS — I5022 Chronic systolic (congestive) heart failure: Secondary | ICD-10-CM | POA: Diagnosis present

## 2019-06-29 DIAGNOSIS — E119 Type 2 diabetes mellitus without complications: Secondary | ICD-10-CM | POA: Diagnosis not present

## 2019-06-29 DIAGNOSIS — M7502 Adhesive capsulitis of left shoulder: Secondary | ICD-10-CM | POA: Diagnosis not present

## 2019-06-29 DIAGNOSIS — I428 Other cardiomyopathies: Secondary | ICD-10-CM | POA: Insufficient documentation

## 2019-06-29 DIAGNOSIS — Z7984 Long term (current) use of oral hypoglycemic drugs: Secondary | ICD-10-CM | POA: Diagnosis not present

## 2019-06-29 LAB — DIGOXIN LEVEL: Digoxin Level: 0.4 ng/mL — ABNORMAL LOW (ref 0.8–2.0)

## 2019-06-29 MED ORDER — CARVEDILOL 12.5 MG PO TABS: 18.7500 mg | ORAL_TABLET | Freq: Two times a day (BID) | ORAL | 3 refills | Status: DC

## 2019-06-29 MED FILL — CARVEDILOL 12.5 MG TABLET: 12.5 | 30 days supply | Qty: 90 | Fill #0

## 2019-06-29 MED FILL — BIDIL TABLET: 20-37.5 | 30 days supply | Qty: 90 | Fill #2

## 2019-06-29 MED FILL — SPIRONOLACTONE 25 MG TABLET: 25 | 90 days supply | Qty: 90 | Fill #0

## 2019-06-29 NOTE — Patient Instructions (Signed)
Increase Carvedilol to 18.75 mg Twice daily   Lab work today  Your physician recommends that you schedule a follow-up appointment in: 3 months  At the [facility], you and your health needs are our priority. As part of our continuing mission to provide you with exceptional heart care, we have created designated Provider Care Teams. These Care Teams include your primary Cardiologist (physician) and Advanced Practice Providers (APPs- Physician Assistants and Nurse Practitioners) who all work together to provide you with the care you need, when you need it.   You may see any of the following providers on your designated Care Team at your next follow up: Marland Kitchen Dr Glori Bickers . Dr Loralie Champagne . Darrick Grinder, NP   Please be sure to bring in all your medications bottles to every appointment.

## 2019-06-29 NOTE — Telephone Encounter (Signed)
    COVID-19 Pre-Screening Questions:  . In the past 7 to 10 days have you had a cough,  shortness of breath, headache, congestion, fever (100 or greater) body aches, chills, sore throat, or sudden loss of taste or sense of smell? No . Have you been around anyone with known Covid 19. No . Have you been around anyone who is awaiting Covid 19 test results in the past 7 to 10 days? No . Have you been around anyone who has been exposed to Covid 19, or has mentioned symptoms of Covid 19 within the past 7 to 10 days? No  If you have any concerns/questions about symptoms patients report during screening (either on the phone or at threshold). Contact the provider seeing the patient or DOD for further guidance.  If neither are available contact a member of the leadership team.        Pt answered no to all covid-19 prescreening questions. I asked the pt to wear a mask if she has one. I let the pt know we are reducing the number of people coming into the office and if she can physically walk into her appointment to please do so. I told her if anything changes between now and her appointment time to please call and let us know.

## 2019-06-29 NOTE — Progress Notes (Signed)
PCP: Dr. Nancy Fetter Cardiology: Dr. Tamala Julian HF Cardiology: Dr. Aundra Dubin  Toni [name] is a 63 y.o. [employer] employee with a history of chronic systolic heart failure, due to nonischemic cardiomyopathy, HTN, DM, and hyperlipidemia.   Initially diagnosed with NICM in 1998 thought to be from HTN versus viral. Had cath in 1998 that was negative for coronary disease. EF at that time was 20% but EF recovered in 2012.   In October 2019, she had a cough/virus and she took OTC meds. Says she would feel better for a little while but then felt bad again. 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  She presented to [facility] ED on 11/13/18 with increased shortness of breath. She was admitted and echo was completed showing EF had gone back down to 15%. She had RHC/LHC with nonobstructive CAD and relatively preserved cardiac output. She was diuresed in the hospital and discharged. CPX in 2/20 showed only mild HF limitation.   Given family history of cardiomyopathy, I sent genetic testing.  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  I had her see Dr. Broadus John, we think that the variant may be benign and unrelated to her cardiomyopathy.  She has no history of conduction disturbance.   Echo in 6/20 showed that EF remains low at 25-30%, mild LV dilation, mildly decreased RV systolic function.  She had a [place] placed.   She returns today for followup of CHF.  She is doing reasonably well. No significant dyspnea with her usual activities.  No lightheadedness.  No chest pain.  No orthopnea/PND.  She has been doing shoulder PT for her left shoulder adhesive capsulitis.    Labs (1/20): K 4, creatinine 0.67 => 0.74 Labs (3/20): K 4.1, creatinine 0.87 Labs (6/20): digoxin level 0.9 Labs (7/20): K 4.1, creatinine 0.74  PMH: 1. HTN 2. Type 2 diabetes 3. Hyperlipidemia 4. Chronic systolic  CHF: Nonischemic cardiomyopathy.  Diagnosed in 1998, EF 20% by echo at that time.  Echo back to normal range by 2012.   - LHC/RHC (12/19): D1 60-70% stenosis; mean RA 6, PA 58/22, mean PCWP 22, CI 2.9.  - Echo (12/19): EF 15% with severe LV dilation, moderate central MR likely functional.  - Cardiac MRI (12/19): Moderate LV dilation with EF 14%, mild RV dilation with EF 17%, LGE at the inferior RV insertion site (nonspecific).  - CPX (2/20): peak VO2 18.6, VE/VCO2 31, RER 1.18 => mild HF limitation.  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  Suspect the variant is benign.  - Echo (6/20): EF 25-30%, mild LV dilation, mildly decreased RV systolic function.  5. Angioedema with ACEI 6. Left shoulder adhesive capsulitis  Social History   Socioeconomic History  . Marital status: Single    Spouse name: Not on file  . Number of children: Not on file  . Years of education: 58  . Highest education level: Bachelor's degree (e.g., BA, AB, BS)  Occupational History  . Occupation: Surveyor, quantity: [employer]  . Financial resource strain: Not very hard  . Food insecurity    Worry: Never true    Inability: Never true  . Transportation needs    Medical: No    Non-medical: No  Tobacco Use  . Smoking status: Never Smoker  . Smokeless tobacco:  Never Used  Substance and Sexual Activity  . Alcohol use: Yes    Alcohol/week: 0.0 standard drinks    Comment: less than once a month  . Drug use: No  . Sexual activity: Never  Lifestyle  . Physical activity    Days per week: 0 days    Minutes per session: 0 min  . Stress: To some extent  Relationships  . Social Herbalist on phone: Not on file    Gets together: Not on file    Attends religious service: Not on file    Active member of club or organization: Not on file    Attends meetings of clubs or organizations: Not on file    Relationship  status: Not on file  . Intimate partner violence    Fear of current or ex partner: Not on file    Emotionally abused: Not on file    Physically abused: Not on file    Forced sexual activity: Not on file  Other Topics Concern  . Not on file  Social History Narrative   Works at [employer].  Lives alone.     Family History  Problem Relation Age of Onset  . Diabetes Mellitus I Mother   . Lung cancer Mother   . Diabetes Mellitus I Father   . Sudden death Father 40  . Hypertension Brother   . Hypertension Brother   . Diabetes Mellitus I Brother   . Benign prostatic hyperplasia Brother   . Heart failure Paternal Uncle    ROS: All systems reviewed and negative except as per HPI.   Current Outpatient Medications  Medication Sig Dispense Refill  . aspirin 81 MG chewable tablet Chew 1 tablet (81 mg total) by mouth daily. 30 tablet 0  . carvedilol (COREG) 12.5 MG tablet Take 1.5 tablets (18.75 mg total) by mouth 2 (two) times daily with a meal. 90 tablet 3  . dapagliflozin propanediol (FARXIGA) 10 MG TABS tablet Take 10 mg by mouth daily.    . digoxin (LANOXIN) 0.125 MG tablet Take 1 tablet (0.125 mg total) by mouth daily. 90 tablet 3  . furosemide (LASIX) 20 MG tablet TAKE 1 TABLET (20 MG TOTAL) BY MOUTH DAILY. 30 tablet 6  . glipiZIDE (GLUCOTROL XL) 10 MG 24 hr tablet Take 10 mg by mouth 2 (two) times daily with a meal.     . isosorbide-hydrALAZINE (BIDIL) 20-37.5 MG tablet Take 1 tablet by mouth 3 (three) times daily. 90 tablet 6  . LINZESS 145 MCG CAPS capsule Take 145 mcg by mouth daily.    Marland Kitchen liraglutide (VICTOZA) 18 MG/3ML SOPN Inject 1.8 mg into the skin every evening.    Marland Kitchen losartan (COZAAR) 25 MG tablet Take 1 tablet (25 mg total) by mouth 2 (two) times a day. 60 tablet 5  . metFORMIN (GLUCOPHAGE) 1000 MG tablet Take 1,000 mg by mouth 2 (two) times daily with a meal.    . Multiple Vitamin (MULTIVITAMIN WITH MINERALS) TABS tablet Take 1 tablet by mouth daily.    . naproxen sodium (ALEVE)  220 MG tablet Take 220 mg by mouth 2 (two) times daily as needed (pain.).    Marland Kitchen simvastatin (ZOCOR) 20 MG tablet Take 20 mg by mouth every evening.    . trolamine salicylate (ASPERCREME) 10 % cream Apply 1 application topically 4 (four) times daily as needed for muscle pain.    . TRUE METRIX BLOOD GLUCOSE TEST test strip Use as directed.  5  . TRUEPLUS  LANCETS 30G MISC Use as directed.  5  . UNIFINE PENTIPS 32G X 4 MM MISC     . spironolactone (ALDACTONE) 25 MG tablet TAKE 1 TABLET BY MOUTH EVERY EVENING. 90 tablet 3   No current facility-administered medications for this encounter.    BP 120/76   Pulse 87   Wt 68.6 kg (151 lb 3.2 oz)   SpO2 96%   BMI 24.40 kg/m  General: NAD Neck: No JVD, no thyromegaly or thyroid nodule.  Lungs: Clear to auscultation bilaterally with normal respiratory effort. CV: Nondisplaced PMI.  Heart regular S1/S2, no S3/S4, no murmur.  No peripheral edema.  No carotid bruit.  Normal pedal pulses.  Abdomen: Soft, nontender, no hepatosplenomegaly, no distention.  Skin: Intact without lesions or rashes.  Neurologic: Alert and oriented x 3.  Psych: Normal affect. Extremities: No clubbing or cyanosis.  HEENT: Normal.   Assessment/Plan: 1. Chronic systolic CHF: Nonischemic cardiomyopathy by 12/19 cath.  Cardiac MRI with LV EF 14%, RV EF 17%. No definite evidence for myocarditis or infiltrative disease by delayed enhancement images.  Most likely cause of cardiomyopathy is familial versus prior viral myocarditis.  Brother also had a cardiomyopathy of uncertain etiology.  Genetic testing was done, showing an LMNA gene variant of uncertain significance => she saw Dr. Broadus John, suspect benign/uninvolved variant (no conduction abnormality).  CPX in 2/20 showed only mild HF limitation.  Echo in 6/20 showed EF 25-30%. She now has a [place].  On exam, she is not volume overloaded.  NYHA class II symptoms.  Narrow QRS, not CRT candidate.  - Increase Coreg to 18.75 mg  bid.  - Continue losartan 25 mg bid.  She cannot take Entresto with history of ACEI angioedema. Recent BMET ok.  - Continue dapagliflozin.  - Continue Lasix 20 mg daily.  - Continue spironolactone 25 mg daily.  - Continue digoxin, check level today.  - Continue Bidil 1 tab tid.   2. Type II diabetes: She is on dapagliflozin.  3. Adhesive capsulitis left shoulder: She is doing PT.   Followup in 3 months.   Loralie Champagne 06/29/2019

## 2019-06-30 ENCOUNTER — Ambulatory Visit (INDEPENDENT_AMBULATORY_CARE_PROVIDER_SITE_OTHER): Payer: No Typology Code available for payment source | Admitting: *Deleted

## 2019-06-30 DIAGNOSIS — I428 Other cardiomyopathies: Secondary | ICD-10-CM | POA: Diagnosis not present

## 2019-06-30 LAB — CUP PACEART INCLINIC DEVICE CHECK
Date Time Interrogation Session: 20200811040000
HighPow Impedance: 54 Ohm
Implantable Lead Implant Date: 20200730
Implantable Lead Location: 753860
Implantable Lead Model: 292
Implantable Lead Serial Number: 447014
Implantable Pulse Generator Implant Date: 20200730
Lead Channel Impedance Value: 483 Ohm
Lead Channel Pacing Threshold Amplitude: 0.7 V
Lead Channel Pacing Threshold Pulse Width: 0.4 ms
Lead Channel Sensing Intrinsic Amplitude: 14 mV
Lead Channel Setting Pacing Amplitude: 3.5 V
Lead Channel Setting Pacing Pulse Width: 0.4 ms
Lead Channel Setting Sensing Sensitivity: 0.5 mV
Pulse Gen Serial Number: 266091

## 2019-06-30 NOTE — Progress Notes (Signed)
Wound check appointment. Steri-strips removed by patient. Wound without redness or edema. Incision edges approximated, wound well healed. Normal device function. Thresholds, sensing, and impedances consistent with implant measurements. Device programmed at 3.5V for extra safety margin until 3 month visit. Histogram distribution appropriate for patient and level of activity. No ventricular arrhythmias noted. Patient educated about wound care, arm mobility, lifting restrictions, shock plan. ROV 09/18/19 w/ GT.

## 2019-07-07 ENCOUNTER — Encounter (HOSPITAL_COMMUNITY): Payer: Self-pay | Admitting: Internal Medicine

## 2019-07-10 MED FILL — VICTOZA 18 MG/3 ML INJECT P: 18 | 90 days supply | Qty: 27 | Fill #0

## 2019-07-14 MED FILL — LOSARTAN POTASSIUM 25 MG TA: 25 | 30 days supply | Qty: 60 | Fill #3

## 2019-07-14 MED FILL — FARXIGA 10 MG TABLET: 10 | 30 days supply | Qty: 30 | Fill #0

## 2019-07-29 ENCOUNTER — Other Ambulatory Visit: Payer: Self-pay

## 2019-07-31 MED FILL — BIDIL TABLET: 20-37.5 | 30 days supply | Qty: 90 | Fill #3

## 2019-08-10 MED FILL — FARXIGA 10 MG TABLET: 10 | 30 days supply | Qty: 30 | Fill #1

## 2019-08-10 MED FILL — glipiZIDE ER 10 MG TB24: 10 | 90 days supply | Qty: 180 | Fill #1

## 2019-08-10 MED FILL — LOSARTAN POTASSIUM 25 MG TA: 25 | 30 days supply | Qty: 60 | Fill #4

## 2019-08-25 MED FILL — FUROSEMIDE 20 MG TABS: 20 | 60 days supply | Qty: 60 | Fill #2

## 2019-08-25 MED FILL — CARVEDILOL 12.5 MG TABLET: 12.5 | 30 days supply | Qty: 90 | Fill #1

## 2019-08-25 MED FILL — SIMVASTATIN 20 MG TABLET: 20 | 90 days supply | Qty: 90 | Fill #1

## 2019-08-31 MED FILL — BIDIL 20-37.5 MG TABS: 20-37.5 | 30 days supply | Qty: 90 | Fill #4

## 2019-09-04 ENCOUNTER — Telehealth (HOSPITAL_COMMUNITY): Payer: Self-pay | Admitting: Pharmacist

## 2019-09-04 MED ORDER — ISOSORBIDE DINITRATE 20 MG PO TABS: 20.0000 mg | ORAL_TABLET | Freq: Three times a day (TID) | ORAL | 0 refills | Status: DC

## 2019-09-04 MED ORDER — HYDRALAZINE HCL 25 MG PO TABS: 37.5000 mg | ORAL_TABLET | Freq: Three times a day (TID) | ORAL | 0 refills | Status: DC

## 2019-09-04 MED FILL — ISOSORBIDE DN 20 MG TABLET: 20 | 30 days supply | Qty: 90 | Fill #0

## 2019-09-04 MED FILL — hydrALAZINE HCL 25 MG TABS: 25 | 30 days supply | Qty: 135 | Fill #0

## 2019-09-04 NOTE — Telephone Encounter (Signed)
Patient called with the complaint that her Bidil is now too expensive. Her copay card is no longer working. After discussion with the pharmacy, the Bidil copay card is attached to a specific NDC, which is now on back order. The new Bidil NDC is not covered by the copay card and [facility] will not accept it at this time. I sent new prescriptions over to the pharmacy for the separate Bidil components (hydralazine and isosorbide dinitrate). I will contact [facility] and determine if there is a timeframe for when this issue will be fixed. Plan on transitioning back to brand Bidil once resolved.   Audry Riles, PharmD, BCPS, CPP Heart Failure Clinic Pharmacist [phone]

## 2019-09-14 MED FILL — FARXIGA 10 MG TABLET: 10 | 30 days supply | Qty: 30 | Fill #2

## 2019-09-14 MED FILL — DIGOXIN 0.125 MG TABLET: 125 | 90 days supply | Qty: 90 | Fill #1

## 2019-09-14 MED FILL — LOSARTAN POTASSIUM 25 MG TA: 25 | 30 days supply | Qty: 60 | Fill #5

## 2019-09-14 MED FILL — metFORMIN HCL 1000 MG TABS: 1000 | 90 days supply | Qty: 180 | Fill #1

## 2019-09-18 ENCOUNTER — Other Ambulatory Visit: Payer: Self-pay

## 2019-09-18 ENCOUNTER — Ambulatory Visit (INDEPENDENT_AMBULATORY_CARE_PROVIDER_SITE_OTHER): Payer: No Typology Code available for payment source | Admitting: Internal Medicine

## 2019-09-18 ENCOUNTER — Encounter: Payer: Self-pay | Admitting: Internal Medicine

## 2019-09-18 VITALS — BP 128/76 | HR 72 | Ht 66.0 in | Wt 146.2 lb

## 2019-09-18 DIAGNOSIS — Z95810 Presence of automatic (implantable) cardiac defibrillator: Secondary | ICD-10-CM

## 2019-09-18 DIAGNOSIS — I428 Other cardiomyopathies: Secondary | ICD-10-CM

## 2019-09-18 NOTE — Patient Instructions (Signed)
Medication Instructions:  Your physician recommends that you continue on your current medications as directed. Please refer to the Current Medication list given to you today.  Labwork: None ordered.  Testing/Procedures: None ordered.  Follow-Up: Your physician wants you to follow-up in: 9 months with Dr. Lovena Le.   You will receive a reminder letter in the mail two months in advance. If you don't receive a letter, please call our office to schedule the follow-up appointment.  Remote monitoring is used to monitor your ICD from home. This monitoring reduces the number of office visits required to check your device to one time per year. It allows [place] to keep an eye on the functioning of your device to ensure it is working properly. You are scheduled for a device check from home on 12/21/2019. You may send your transmission at any time that day. If you have a wireless device, the transmission will be sent automatically. After your physician reviews your transmission, you will receive a postcard with your next transmission date.  Any Other Special Instructions Will Be Listed Below (If Applicable).  If you need a refill on your cardiac medications before your next appointment, please call your pharmacy.

## 2019-09-18 NOTE — Progress Notes (Signed)
HPI Ms. Veliz returns today for followup of her ICD in the setting of chronic systolic heart failure and a non-ischemic CM. She still has some anxiety about her device. She has not had any ICD therapies. She denies pain at her ICD insertion site. No edema. No chest pain.  Allergies  Allergen Reactions  . Biaxin [Clarithromycin]   . Enalapril Maleate Other (See Comments)    Lip swelling / angioedema  . Vasotec [Enalapril] Other (See Comments)    Lip swelling / angioedema Tolerates ARB     Current Outpatient Medications  Medication Sig Dispense Refill  . aspirin 81 MG chewable tablet Chew 1 tablet (81 mg total) by mouth daily. 30 tablet 0  . carvedilol (COREG) 12.5 MG tablet Take 1.5 tablets (18.75 mg total) by mouth 2 (two) times daily with a meal. 90 tablet 3  . dapagliflozin propanediol (FARXIGA) 10 MG TABS tablet Take 10 mg by mouth daily.    . digoxin (LANOXIN) 0.125 MG tablet Take 1 tablet (0.125 mg total) by mouth daily. 90 tablet 3  . furosemide (LASIX) 20 MG tablet TAKE 1 TABLET (20 MG TOTAL) BY MOUTH DAILY. 30 tablet 6  . glipiZIDE (GLUCOTROL XL) 10 MG 24 hr tablet Take 10 mg by mouth 2 (two) times daily with a meal.     . hydrALAZINE (APRESOLINE) 25 MG tablet Take 1.5 tablets (37.5 mg total) by mouth 3 (three) times daily. 135 tablet 0  . isosorbide dinitrate (ISORDIL) 20 MG tablet Take 1 tablet (20 mg total) by mouth 3 (three) times daily. 90 tablet 0  . isosorbide-hydrALAZINE (BIDIL) 20-37.5 MG tablet Take 1 tablet by mouth 3 (three) times daily. 90 tablet 6  . LINZESS 145 MCG CAPS capsule Take 145 mcg by mouth daily.    Marland Kitchen liraglutide (VICTOZA) 18 MG/3ML SOPN Inject 1.8 mg into the skin every evening.    Marland Kitchen losartan (COZAAR) 25 MG tablet Take 1 tablet (25 mg total) by mouth 2 (two) times a day. 60 tablet 5  . metFORMIN (GLUCOPHAGE) 1000 MG tablet Take 1,000 mg by mouth 2 (two) times daily with a meal.    . Multiple Vitamin (MULTIVITAMIN WITH MINERALS) TABS tablet Take  1 tablet by mouth daily.    . naproxen sodium (ALEVE) 220 MG tablet Take 220 mg by mouth 2 (two) times daily as needed (pain.).    Marland Kitchen simvastatin (ZOCOR) 20 MG tablet Take 20 mg by mouth every evening.    Marland Kitchen spironolactone (ALDACTONE) 25 MG tablet TAKE 1 TABLET BY MOUTH EVERY EVENING. 90 tablet 3  . trolamine salicylate (ASPERCREME) 10 % cream Apply 1 application topically 4 (four) times daily as needed for muscle pain.    . TRUE METRIX BLOOD GLUCOSE TEST test strip Use as directed.  5  . TRUEPLUS LANCETS 30G MISC Use as directed.  5  . UNIFINE PENTIPS 32G X 4 MM MISC      No current facility-administered medications for this visit.      Past Medical History:  Diagnosis Date  . CHF (congestive heart failure) ([place])   . Diabetes mellitus without complication ([place])   . Hypertension     ROS:   All systems reviewed and negative except as noted in the HPI.   Past Surgical History:  Procedure Laterality Date  . ABDOMINAL HYSTERECTOMY    . CARDIAC CATHETERIZATION     in [facility], clean, per pt.  . ICD IMPLANT N/A 06/18/2019   Procedure: ICD IMPLANT;  Surgeon: Evans Lance, MD;  Location: [facility];  Service: Cardiovascular;  Laterality: N/A;  . RIGHT/LEFT HEART CATH AND CORONARY ANGIOGRAPHY N/A 11/17/2018   Procedure: RIGHT/LEFT HEART CATH AND CORONARY ANGIOGRAPHY;  Surgeon: Belva Crome, MD;  Location: [facility];  Service: Cardiovascular;  Laterality: N/A;     Family History  Problem Relation Age of Onset  . Diabetes Mellitus I Mother   . Lung cancer Mother   . Diabetes Mellitus I Father   . Sudden death Father 24  . Hypertension Brother   . Hypertension Brother   . Diabetes Mellitus I Brother   . Benign prostatic hyperplasia Brother   . Heart failure Paternal Uncle      Social History   Socioeconomic History  . Marital status: Single    Spouse name: Not on file  . Number of children: Not on file  . Years of education: 68  . Highest education  level: Bachelor's degree (e.g., BA, AB, BS)  Occupational History  . 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 Financial resource strain: Not very hard  . Food insecurity    Worry: Never true    Inability: Never true  . Transportation needs    Medical: No    Non-medical: No  Tobacco Use  . Smoking status: Never Smoker  . Smokeless tobacco: Never Used  Substance and Sexual Activity  . Alcohol use: Yes    Alcohol/week: 0.0 standard drinks    Comment: less than once a month  . Drug use: No  . Sexual activity: Never  Lifestyle  . Physical activity    Days per week: 0 days    Minutes per session: 0 min  . Stress: To some extent  Relationships  . Social Herbalist on phone: Not on file    Gets together: Not on file    Attends religious service: Not on file    Active member of club or organization: Not on file    Attends meetings of clubs or organizations: Not on file    Relationship status: Not on file  . Intimate partner violence    Fear of current or ex partner: Not on file    Emotionally abused: Not on file    Physically abused: Not on file    Forced sexual activity: Not on file  Other Topics Concern  . Not on file  Social History Narrative   Works at [employer].  Lives alone.       BP 128/76   Pulse 72   Ht 5\' 6"  (1.676 m)   Wt 146 lb 3.2 oz (66.3 kg)   SpO2 96%   BMI 23.60 kg/m   Physical Exam:  Well appearing NAD HEENT: Unremarkable Neck:  No JVD, no thyromegally Lymphatics:  No adenopathy Back:  No CVA tenderness Lungs:  Clear with no wheezes HEART:  Regular rate rhythm, no murmurs, no rubs, no clicks Abd:  soft, positive bowel sounds, no organomegally, no rebound, no guarding Ext:  2 plus pulses, no edema, no cyanosis, no clubbing Skin:  No rashes no nodules Neuro:  CN II through XII intact, motor grossly intact  EKG - NSR with poor R wave progression  Assess/Plan: 1. Chronic systolic heart failure - her  symptoms are class 2. She will continue her current meds. 2. ICD - her [place] [employer] single chamber ICD is working normally. Her RV threshold is up a bit but  she is not pacing much. 3. HTN - her SBP is minimally elevated.   Mikle Bosworth.D.

## 2019-09-22 MED FILL — LINZESS 145 MCG CAPSULE: 145 | 90 days supply | Qty: 90 | Fill #0

## 2019-09-22 MED FILL — SPIRONOLACTONE 25 MG TABLET: 25 | 90 days supply | Qty: 90 | Fill #1

## 2019-09-30 ENCOUNTER — Encounter (HOSPITAL_COMMUNITY): Payer: Self-pay | Admitting: Cardiology

## 2019-09-30 ENCOUNTER — Other Ambulatory Visit: Payer: Self-pay

## 2019-09-30 ENCOUNTER — Ambulatory Visit (INDEPENDENT_AMBULATORY_CARE_PROVIDER_SITE_OTHER): Payer: No Typology Code available for payment source | Admitting: *Deleted

## 2019-09-30 ENCOUNTER — Ambulatory Visit (HOSPITAL_COMMUNITY)
Admission: RE | Admit: 2019-09-30 | Discharge: 2019-09-30 | Disposition: A | Discharge status: Home / Self Care | Payer: No Typology Code available for payment source | Source: Ambulatory Visit | Attending: Cardiology | Admitting: Cardiology

## 2019-09-30 VITALS — BP 125/76 | HR 82 | Wt 141.8 lb

## 2019-09-30 DIAGNOSIS — Z79899 Other long term (current) drug therapy: Secondary | ICD-10-CM | POA: Diagnosis not present

## 2019-09-30 DIAGNOSIS — I428 Other cardiomyopathies: Secondary | ICD-10-CM

## 2019-09-30 DIAGNOSIS — I5023 Acute on chronic systolic (congestive) heart failure: Secondary | ICD-10-CM

## 2019-09-30 DIAGNOSIS — I5022 Chronic systolic (congestive) heart failure: Secondary | ICD-10-CM

## 2019-09-30 DIAGNOSIS — Z833 Family history of diabetes mellitus: Secondary | ICD-10-CM | POA: Insufficient documentation

## 2019-09-30 DIAGNOSIS — Z7982 Long term (current) use of aspirin: Secondary | ICD-10-CM | POA: Insufficient documentation

## 2019-09-30 DIAGNOSIS — E785 Hyperlipidemia, unspecified: Secondary | ICD-10-CM | POA: Diagnosis not present

## 2019-09-30 DIAGNOSIS — Z801 Family history of malignant neoplasm of trachea, bronchus and lung: Secondary | ICD-10-CM | POA: Diagnosis not present

## 2019-09-30 DIAGNOSIS — I110 Hypertensive heart disease with heart failure: Secondary | ICD-10-CM | POA: Insufficient documentation

## 2019-09-30 DIAGNOSIS — Z95810 Presence of automatic (implantable) cardiac defibrillator: Secondary | ICD-10-CM | POA: Diagnosis not present

## 2019-09-30 DIAGNOSIS — Z7984 Long term (current) use of oral hypoglycemic drugs: Secondary | ICD-10-CM | POA: Diagnosis not present

## 2019-09-30 DIAGNOSIS — E119 Type 2 diabetes mellitus without complications: Secondary | ICD-10-CM | POA: Insufficient documentation

## 2019-09-30 DIAGNOSIS — Z8249 Family history of ischemic heart disease and other diseases of the circulatory system: Secondary | ICD-10-CM | POA: Diagnosis not present

## 2019-09-30 LAB — CUP PACEART REMOTE DEVICE CHECK
Date Time Interrogation Session: 20201111161719
Implantable Lead Implant Date: 20200730
Implantable Lead Location: 753860
Implantable Lead Model: 292
Implantable Lead Serial Number: 447014
Implantable Pulse Generator Implant Date: 20200730
Pulse Gen Serial Number: 266091

## 2019-09-30 LAB — DIGOXIN LEVEL: Digoxin Level: 1.2 ng/mL (ref 0.8–2.0)

## 2019-09-30 LAB — BASIC METABOLIC PANEL WITH GFR
Anion gap: 13 (ref 5–15)
BUN: 22 mg/dL (ref 8–23)
CO2: 21 mmol/L — ABNORMAL LOW (ref 22–32)
Calcium: 10.2 mg/dL (ref 8.9–10.3)
Chloride: 100 mmol/L (ref 98–111)
Creatinine, Ser: 0.65 mg/dL (ref 0.44–1.00)
GFR calc Af Amer: >60 mL/min (ref >60)
GFR calc non Af Amer: >60 mL/min (ref >60)
Glucose, Bld: 179 mg/dL — ABNORMAL HIGH (ref 70–99)
Potassium: 4.5 mmol/L (ref 3.5–5.1)
Sodium: 134 mmol/L — ABNORMAL LOW (ref 135–145)

## 2019-09-30 MED ORDER — HYDRALAZINE HCL 50 MG PO TABS: 50.0000 mg | ORAL_TABLET | Freq: Three times a day (TID) | ORAL | 1 refills | Status: DC

## 2019-09-30 MED FILL — hydrALAZINE HCL 50 MG TABS: 50 | 90 days supply | Qty: 270 | Fill #0

## 2019-09-30 MED FILL — CARVEDILOL 12.5 MG TABLET: 12.5 | 30 days supply | Qty: 90 | Fill #2

## 2019-09-30 NOTE — Patient Instructions (Signed)
Labs done today. We will contact you only if your labs are abnormal.  INCREASE Hydralazine 50mg (1 tab) by mouth three times daily.  You have been referred to cardiac Rehab. They will contact you to schedule an appointment.  Your physician recommends that you schedule a follow-up appointment in: 3 months  At the [facility], you and your health needs are our priority. As part of our continuing mission to provide you with exceptional heart care, we have created designated Provider Care Teams. These Care Teams include your primary Cardiologist (physician) and Advanced Practice Providers (APPs- Physician Assistants and Nurse Practitioners) who all work together to provide you with the care you need, when you need it.   You may see any of the following providers on your designated Care Team at your next follow up: Marland Kitchen Dr Glori Bickers . Dr Loralie Champagne . Darrick Grinder, NP . Lyda Jester, PA   Please be sure to bring in all your medications bottles to every appointment.

## 2019-09-30 NOTE — Progress Notes (Signed)
PCP: Dr. Nancy Fetter Cardiology: Dr. Tamala Julian HF Cardiology: Dr. Aundra Dubin  Toni [name] is a 63 y.o. [employer] employee with a history of chronic systolic heart failure, due to nonischemic cardiomyopathy, HTN, DM, and hyperlipidemia.   Initially diagnosed with NICM in 1998 thought to be from HTN versus viral. Had cath in 1998 that was negative for coronary disease. EF at that time was 20% but EF recovered in 2012.   In October 2019, she had a cough/virus and she took OTC meds. Says she would feel better for a little while but then felt bad again. 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  She presented to [facility] ED on 11/13/18 with increased shortness of breath. She was admitted and echo was completed showing EF had gone back down to 15%. She had RHC/LHC with nonobstructive CAD and relatively preserved cardiac output. She was diuresed in the hospital and discharged. CPX in 2/20 showed only mild HF limitation.   Given family history of cardiomyopathy, I sent genetic testing.  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  I had her see Dr. Broadus John, we think that the variant may be benign and unrelated to her cardiomyopathy.  She has no history of conduction disturbance.   Echo in 6/20 showed that EF remains low at 25-30%, mild LV dilation, mildly decreased RV systolic function.  She had a [place] placed.   She returns today for followup of CHF.  She has not dyspnea walking on flat ground.  No orthopnea/PND.  Occasional lightheadedness if she stands too fast. Dyspnea with strenuous activity.  She has had a lot of anxiety recently and is tearful when discussing the difficulty she has been having with trying to get disability.  She says that she gets short of breath when she gets anxious.  Weight is down 10 lbs.   Labs (1/20): K 4, creatinine 0.67 => 0.74 Labs (3/20): K 4.1,  creatinine 0.87 Labs (6/20): digoxin level 0.9 Labs (7/20): K 4.1, creatinine 0.74 Labs (8/20): digoxin 0.4  PMH: 1. HTN 2. Type 2 diabetes 3. Hyperlipidemia 4. Chronic systolic CHF: Nonischemic cardiomyopathy.  Diagnosed in 1998, EF 20% by echo at that time.  Echo back to normal range by 2012.   - LHC/RHC (12/19): D1 60-70% stenosis; mean RA 6, PA 58/22, mean PCWP 22, CI 2.9.  - Echo (12/19): EF 15% with severe LV dilation, moderate central MR likely functional.  - Cardiac MRI (12/19): Moderate LV dilation with EF 14%, mild RV dilation with EF 17%, LGE at the inferior RV insertion site (nonspecific).  - CPX (2/20): peak VO2 18.6, VE/VCO2 31, RER 1.18 => mild HF limitation.  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  Suspect the variant is benign.  - Echo (6/20): EF 25-30%, mild LV dilation, mildly decreased RV systolic function.  5. Angioedema with ACEI 6. Left shoulder adhesive capsulitis  Social History   Socioeconomic History  . Marital status: Single    Spouse name: Not on file  . Number of children: Not on file  . Years of education: 26  . Highest education level: Bachelor's degree (e.g., BA, AB, BS)  Occupational History  . Occupation: Surveyor, quantity: [employer]  . Financial resource strain: Not very hard  . Food insecurity    Worry:  Never true    Inability: Never true  . Transportation needs    Medical: No    Non-medical: No  Tobacco Use  . Smoking status: Never Smoker  . Smokeless tobacco: Never Used  Substance and Sexual Activity  . Alcohol use: Yes    Alcohol/week: 0.0 standard drinks    Comment: less than once a month  . Drug use: No  . Sexual activity: Never  Lifestyle  . Physical activity    Days per week: 0 days    Minutes per session: 0 min  . Stress: To some extent  Relationships  . Social Herbalist on phone: Not on file    Gets together:  Not on file    Attends religious service: Not on file    Active member of club or organization: Not on file    Attends meetings of clubs or organizations: Not on file    Relationship status: Not on file  . Intimate partner violence    Fear of current or ex partner: Not on file    Emotionally abused: Not on file    Physically abused: Not on file    Forced sexual activity: Not on file  Other Topics Concern  . Not on file  Social History Narrative   Works at [employer].  Lives alone.     Family History  Problem Relation Age of Onset  . Diabetes Mellitus I Mother   . Lung cancer Mother   . Diabetes Mellitus I Father   . Sudden death Father 60  . Hypertension Brother   . Hypertension Brother   . Diabetes Mellitus I Brother   . Benign prostatic hyperplasia Brother   . Heart failure Paternal Uncle    ROS: All systems reviewed and negative except as per HPI.   Current Outpatient Medications  Medication Sig Dispense Refill  . aspirin 81 MG chewable tablet Chew 1 tablet (81 mg total) by mouth daily. 30 tablet 0  . carvedilol (COREG) 12.5 MG tablet Take 1.5 tablets (18.75 mg total) by mouth 2 (two) times daily with a meal. 90 tablet 3  . dapagliflozin propanediol (FARXIGA) 10 MG TABS tablet Take 10 mg by mouth daily.    . digoxin (LANOXIN) 0.125 MG tablet Take 1 tablet (0.125 mg total) by mouth daily. 90 tablet 3  . furosemide (LASIX) 20 MG tablet TAKE 1 TABLET (20 MG TOTAL) BY MOUTH DAILY. 30 tablet 6  . glipiZIDE (GLUCOTROL XL) 10 MG 24 hr tablet Take 10 mg by mouth 2 (two) times daily with a meal.     . hydrALAZINE (APRESOLINE) 50 MG tablet Take 1 tablet (50 mg total) by mouth 3 (three) times daily. 270 tablet 1  . isosorbide dinitrate (ISORDIL) 20 MG tablet Take 1 tablet (20 mg total) by mouth 3 (three) times daily. 90 tablet 0  . LINZESS 145 MCG CAPS capsule Take 145 mcg by mouth daily.    Marland Kitchen liraglutide (VICTOZA) 18 MG/3ML SOPN Inject 1.8 mg into the skin every evening.    Marland Kitchen losartan  (COZAAR) 25 MG tablet Take 1 tablet (25 mg total) by mouth 2 (two) times a day. 60 tablet 5  . metFORMIN (GLUCOPHAGE) 1000 MG tablet Take 1,000 mg by mouth 2 (two) times daily with a meal.    . Multiple Vitamin (MULTIVITAMIN WITH MINERALS) TABS tablet Take 1 tablet by mouth daily.    . simvastatin (ZOCOR) 20 MG tablet Take 20 mg by mouth every evening.    Marland Kitchen  spironolactone (ALDACTONE) 25 MG tablet TAKE 1 TABLET BY MOUTH EVERY EVENING. 90 tablet 3  . TRUE METRIX BLOOD GLUCOSE TEST test strip Use as directed.  5  . TRUEPLUS LANCETS 30G MISC Use as directed.  5  . UNIFINE PENTIPS 32G X 4 MM MISC      No current facility-administered medications for this encounter.    BP 125/76   Pulse 82   Wt 64.3 kg (141 lb 12.8 oz)   SpO2 100%   BMI 22.89 kg/m  General: NAD Neck: No JVD, no thyromegaly or thyroid nodule.  Lungs: Clear to auscultation bilaterally with normal respiratory effort. CV: Nondisplaced PMI.  Heart regular S1/S2, no S3/S4, no murmur.  No peripheral edema.  No carotid bruit.  Normal pedal pulses.  Abdomen: Soft, nontender, no hepatosplenomegaly, no distention.  Skin: Intact without lesions or rashes.  Neurologic: Alert and oriented x 3.  Psych: Normal affect. Extremities: No clubbing or cyanosis.  HEENT: Normal.   Assessment/Plan: 1. Chronic systolic CHF: Nonischemic cardiomyopathy by 12/19 cath.  Cardiac MRI with LV EF 14%, RV EF 17%. No definite evidence for myocarditis or infiltrative disease by delayed enhancement images.  Most likely cause of cardiomyopathy is familial versus prior viral myocarditis.  Brother also had a cardiomyopathy of uncertain etiology.  Genetic testing was done, showing an LMNA gene variant of uncertain significance => she saw Dr. Broadus John, suspect benign/uninvolved variant (no conduction abnormality).  CPX in 2/20 showed only mild HF limitation.  Echo in 6/20 showed EF 25-30%. She now has a [place].  On exam, she is not volume overloaded.  NYHA  class II symptoms.  Narrow QRS, not CRT candidate.  - Continue Coreg 18.75 mg bid.  - Continue losartan 25 mg bid.  She cannot take Entresto with history of ACEI angioedema. BMET today.  - Continue dapagliflozin.  - Continue Lasix 20 mg daily.  - Continue spironolactone 25 mg daily.  - Continue digoxin, check level today.  - Increase hydralazine to 50 mg tid, continue isordil 20 mg tid.  - I will have her restart cardiac rehab (did not finish before it was closed for COVID-19 pandemic).  2. Type II diabetes: She is on dapagliflozin.   Followup in 3 months.   Loralie Champagne 09/30/2019

## 2019-10-01 ENCOUNTER — Telehealth (HOSPITAL_COMMUNITY): Payer: Self-pay | Admitting: *Deleted

## 2019-10-01 ENCOUNTER — Encounter (HOSPITAL_COMMUNITY): Payer: Self-pay | Admitting: *Deleted

## 2019-10-01 DIAGNOSIS — I5022 Chronic systolic (congestive) heart failure: Secondary | ICD-10-CM

## 2019-10-01 NOTE — Progress Notes (Signed)
Received referral from Dr. Aundra Dubin  for this pt to participate in Cardiac rehab with the diagnosis of Chronic Systolic Heart Failure.  Pt completed 5 exercise sessions prior to departmental closure due to Covid -19. Clinical review of pt follow up appt on 11/11 with Dr. Aundra Dubin - cardiologist office note. Pt is making the expected progress in recovery.  Pt appropriate for scheduling for on site cardiac rehab and/or enrollment in Virtual Cardiac Rehab.  Pt Covid score is 5.  Will forward to staff for follow up. Cherre Huger, BSN Cardiac and Training and development officer

## 2019-10-01 NOTE — Telephone Encounter (Signed)
-----   Message from Larey Dresser, MD sent at 10/01/2019  9:05 AM EST ----- Repeat digoxin level as trough (before am dose).

## 2019-10-01 NOTE — Telephone Encounter (Signed)
Notes recorded by Harvie Junior, CMA on 10/01/2019 at 11:36 AM EST  Pt returned call she is aware of results and lab appt scheduled.    "repeat dig as trough"

## 2019-10-02 ENCOUNTER — Telehealth (HOSPITAL_COMMUNITY): Payer: Self-pay

## 2019-10-02 NOTE — Telephone Encounter (Signed)
Pt returned CR phone call and stated she would like to participate in CR. Patient will come in for orientation on 11/03/2019 @ 8AM and will attend the 8:45AM exercise class. Went over insurance, patient verbalized understanding.   Tourist information centre manager.

## 2019-10-02 NOTE — Telephone Encounter (Signed)
Pt insurance is active and benefits verified through [employer]. Co-pay $0.00, DED $0.00/$0.00 met, out of pocket $2,500.00/$3,152.92 met, co-insurance 0%. No pre-authorization required. Zack/[facility], 10/02/2019 @ 3:03PM, OIB#[id number]

## 2019-10-02 NOTE — Telephone Encounter (Signed)
Attempted to call patient in regards to Cardiac Rehab - LM on VM Mailed letter 

## 2019-10-05 ENCOUNTER — Ambulatory Visit (HOSPITAL_COMMUNITY)
Admission: RE | Admit: 2019-10-05 | Discharge: 2019-10-05 | Disposition: A | Discharge status: Home / Self Care | Payer: No Typology Code available for payment source | Source: Ambulatory Visit | Attending: Internal Medicine | Admitting: Internal Medicine

## 2019-10-05 ENCOUNTER — Other Ambulatory Visit: Payer: Self-pay

## 2019-10-05 ENCOUNTER — Other Ambulatory Visit (HOSPITAL_COMMUNITY): Payer: Self-pay | Admitting: Cardiology

## 2019-10-05 DIAGNOSIS — I5022 Chronic systolic (congestive) heart failure: Secondary | ICD-10-CM | POA: Diagnosis not present

## 2019-10-05 MED FILL — VICTOZA 18 MG/3 ML INJECT P: 18 | 90 days supply | Qty: 27 | Fill #1

## 2019-10-05 MED FILL — ISOSORBIDE DN 20 MG TABLET: 20 | 30 days supply | Qty: 90 | Fill #0

## 2019-10-09 ENCOUNTER — Encounter (HOSPITAL_COMMUNITY): Payer: Self-pay | Admitting: *Deleted

## 2019-10-09 NOTE — Progress Notes (Signed)
Completed Attending Physician statement from [employer] for pt's disability, forms signed by Dr Aundra Dubin and faxed to them at [phone], pt is aware and copy mailed to her

## 2019-10-16 ENCOUNTER — Other Ambulatory Visit (HOSPITAL_COMMUNITY): Payer: Self-pay | Admitting: Cardiology

## 2019-10-19 MED FILL — LOSARTAN POTASSIUM 25 MG TA: 25 | 30 days supply | Qty: 60 | Fill #0

## 2019-10-19 MED FILL — FUROSEMIDE 20 MG TABS: 20 | 30 days supply | Qty: 30 | Fill #0

## 2019-10-21 NOTE — Progress Notes (Signed)
Remote ICD transmission.   

## 2019-10-23 ENCOUNTER — Telehealth: Payer: Self-pay | Admitting: Internal Medicine

## 2019-10-23 ENCOUNTER — Telehealth (HOSPITAL_COMMUNITY): Payer: Self-pay | Admitting: Pharmacist

## 2019-10-23 ENCOUNTER — Other Ambulatory Visit (HOSPITAL_COMMUNITY): Payer: Self-pay | Admitting: Pharmacist

## 2019-10-23 MED FILL — UNIFINE PENTIPS 32GX5/32": 32G X 4 MM | 90 days supply | Qty: 100 | Fill #1

## 2019-10-23 MED FILL — ACCU-CHEK GUIDE STRP: 90 days supply | Qty: 200 | Fill #1

## 2019-10-23 MED FILL — UNIFINE PENTIPS 32GX5/32: 32G X 4 MM | 90 days supply | Qty: 100 | Fill #1

## 2019-10-23 MED FILL — ACCU-CHEK FASTCLIX LANCETS: 90 days supply | Qty: 204 | Fill #1

## 2019-10-23 NOTE — Telephone Encounter (Signed)
Cardiac Rehab Medication Review by a Pharmacist  Does the patient  feel that his/her medications are working for him/her?  Patient does not believe that the Victoza is working for her anymore. Her BGs have been more elevated recently. Other factors that could have contributed to the elevated BGs are thanksgiving meals, decreased physical exercise, or progression of diabetes.  Patient does experience some dizziness when standing up too quickly. Counseled patient to sit-upright on bed for 30-60 minutes prior to standing up.  Has the patient been experiencing any side effects to the medications prescribed?  No - tolerating medications well.  Does the patient measure his/her own blood pressure or blood glucose at home?  Patient checks blood sugars intermittently. Most recent fasting blood glucose was on 11/3, which was 159. Patient does not check blood pressure.   Does the patient have any problems obtaining medications due to transportation or finances?   No issues currently  Understanding of regimen: excellent Understanding of indications: excellent Potential of compliance: fair - patient does report missing the afternoon dose of hydralazine/isosorbide once a week. Counseled patient on importance of this medicine and the mortality benefit. Patient does not have miss doses with other medications.   Pharmacist comments: - Consider changing patient's Victoza to Ozempic in January since the patient will have new insurance at that time. Ozempic may have a bit more potency in terms of reduction in A1c. - Consider changing losartan 25 mg BID to losartan 50 mg daily. This medicine is normally dosed once a day - Current treatment is adherent to guideline recommendations.    Sherren Kerns, PharmD PGY1 Acute Care Pharmacy Resident 10/23/2019 10:36 AM

## 2019-10-23 NOTE — Telephone Encounter (Signed)
Toni [name] from [employer] is calling stating she is faxing over a statement form. They need patients most recent office note & test results from 06/20/19 - current date.

## 2019-11-02 ENCOUNTER — Telehealth (HOSPITAL_COMMUNITY): Payer: Self-pay

## 2019-11-02 MED FILL — CARVEDILOL 12.5 MG TABLET: 12.5 | 30 days supply | Qty: 90 | Fill #3

## 2019-11-02 NOTE — Telephone Encounter (Signed)
Successful telephone encounter to Toni [name] to confirm Cardiac Rehab orientation appointment for 11/03/2019 at 0800. Nursing assessment completed. Instructions for appointment provided. Patient screening for Covid-19 negative. Arrionna Serena E. Laray Anger, BSN

## 2019-11-03 ENCOUNTER — Encounter (HOSPITAL_COMMUNITY)
Admission: RE | Admit: 2019-11-03 | Discharge: 2019-11-03 | Disposition: A | Discharge status: Home / Self Care | Payer: No Typology Code available for payment source | Source: Ambulatory Visit | Attending: Cardiology | Admitting: Cardiology

## 2019-11-03 ENCOUNTER — Other Ambulatory Visit: Payer: Self-pay

## 2019-11-03 ENCOUNTER — Encounter (HOSPITAL_COMMUNITY): Payer: Self-pay

## 2019-11-03 ENCOUNTER — Encounter (INDEPENDENT_AMBULATORY_CARE_PROVIDER_SITE_OTHER): Payer: Self-pay

## 2019-11-03 VITALS — BP 100/54 | HR 99 | Temp 97.3°F | Resp 18 | Ht 66.0 in | Wt 146.8 lb

## 2019-11-03 DIAGNOSIS — I5022 Chronic systolic (congestive) heart failure: Secondary | ICD-10-CM | POA: Insufficient documentation

## 2019-11-03 LAB — GLUCOSE, CAPILLARY: Glucose-Capillary: 163 mg/dL — ABNORMAL HIGH (ref 70–99)

## 2019-11-03 NOTE — Progress Notes (Signed)
Cardiac Individual Treatment Plan  Patient Details  Name: Toni [name] MRN: [id number] Date of Birth: 05-Dec-1955 Referring Provider:     CARDIAC REHAB PHASE II ORIENTATION from 11/03/2019 in [facility]  Referring Provider  Dr. Aundra Dubin      Initial Encounter Date:    CARDIAC REHAB PHASE II ORIENTATION from 11/03/2019 in [facility]  Date  11/03/19      Visit Diagnosis: Chronic systolic CHF (congestive heart failure) ([place])  Patient's Home Medications on Admission:  Current Outpatient Medications:  .  aspirin 81 MG chewable tablet, Chew 1 tablet (81 mg total) by mouth daily., Disp: 30 tablet, Rfl: 0 .  carvedilol (COREG) 12.5 MG tablet, Take 1.5 tablets (18.75 mg total) by mouth 2 (two) times daily with a meal., Disp: 90 tablet, Rfl: 3 .  dapagliflozin propanediol (FARXIGA) 10 MG TABS tablet, Take 10 mg by mouth daily., Disp: , Rfl:  .  digoxin (LANOXIN) 0.125 MG tablet, Take 1 tablet (0.125 mg total) by mouth daily., Disp: 90 tablet, Rfl: 3 .  furosemide (LASIX) 20 MG tablet, TAKE 1 TABLET (20 MG TOTAL) BY MOUTH DAILY., Disp: 30 tablet, Rfl: 5 .  glipiZIDE (GLUCOTROL XL) 10 MG 24 hr tablet, Take 10 mg by mouth 2 (two) times daily with a meal. , Disp: , Rfl:  .  hydrALAZINE (APRESOLINE) 50 MG tablet, Take 1 tablet (50 mg total) by mouth 3 (three) times daily., Disp: 270 tablet, Rfl: 1 .  isosorbide dinitrate (ISORDIL) 20 MG tablet, TAKE 1 TABLET (20 MG TOTAL) BY MOUTH 3 TIMES DAILY., Disp: 90 tablet, Rfl: 0 .  LINZESS 145 MCG CAPS capsule, Take 145 mcg by mouth daily., Disp: , Rfl:  .  liraglutide (VICTOZA) 18 MG/3ML SOPN, Inject 1.8 mg into the skin every evening., Disp: , Rfl:  .  losartan (COZAAR) 25 MG tablet, TAKE 1 TABLET BY MOUTH 2 TIMES A DAY., Disp: 60 tablet, Rfl: 5 .  metFORMIN (GLUCOPHAGE) 1000 MG tablet, Take 1,000 mg by mouth 2 (two) times daily with a meal., Disp: , Rfl:  .  Multiple Vitamin (MULTIVITAMIN WITH  MINERALS) TABS tablet, Take 1 tablet by mouth daily., Disp: , Rfl:  .  simvastatin (ZOCOR) 20 MG tablet, Take 20 mg by mouth every evening., Disp: , Rfl:  .  spironolactone (ALDACTONE) 25 MG tablet, TAKE 1 TABLET BY MOUTH EVERY EVENING., Disp: 90 tablet, Rfl: 3 .  TRUE METRIX BLOOD GLUCOSE TEST test strip, Use as directed., Disp: , Rfl: 5 .  TRUEPLUS LANCETS 30G MISC, Use as directed., Disp: , Rfl: 5 .  UNIFINE PENTIPS 32G X 4 MM MISC, , Disp: , Rfl:  .  vitamin C (ASCORBIC ACID) 500 MG tablet, Take 500 mg by mouth 2 (two) times daily., Disp: , Rfl:   Past Medical History: Past Medical History:  Diagnosis Date  . CHF (congestive heart failure) ([place])   . Diabetes mellitus without complication ([place])   . Hypertension     Tobacco Use: Social History   Tobacco Use  Smoking Status Never Smoker  Smokeless Tobacco Never Used    Labs: Recent Review Flowsheet Data    Labs for ITP Cardiac and Pulmonary Rehab Latest Ref Rng & Units 10/26/2016 11/14/2018 11/15/2018 11/17/2018 11/17/2018   Cholestrol 0 - 200 mg/dL - - 182 - -   LDLCALC 0 - 99 mg/dL - - 117(H) - -   HDL >40 mg/dL - - 38(L) - -   Trlycerides <  150 mg/dL - - 134 - -   Hemoglobin A1c 4.8 - 5.6 % 7.6 6.9(H) - - -   PHART 7.350 - 7.450 - - - - 7.307(L)   PCO2ART 32.0 - 48.0 mmHg - - - - 43.9   HCO3 20.0 - 28.0 mmol/L - - - 25.5 22.0   TCO2 22 - 32 mmol/L - - - 27 23   ACIDBASEDEF 0.0 - 2.0 mmol/L - - - 1.0 4.0(H)   O2SAT % - - - 66.0 93.0      Capillary Blood Glucose: Lab Results  Component Value Date   GLUCAP 163 (H) 11/03/2019   GLUCAP 90 06/18/2019   GLUCAP 133 (H) 06/18/2019   GLUCAP 169 (H) 01/28/2019   GLUCAP 232 (H) 01/26/2019     Exercise Target Goals: Exercise Program Goal: Individual exercise prescription set using results from initial 6 min walk test and THRR while considering  patient's activity barriers and safety.   Exercise Prescription Goal: Starting with aerobic activity 30 plus minutes a day, 3  days per week for initial exercise prescription. Provide home exercise prescription and guidelines that participant acknowledges understanding prior to discharge.  Activity Barriers & Risk Stratification: Activity Barriers & Cardiac Risk Stratification - 11/03/19 0910      Activity Barriers & Cardiac Risk Stratification   Activity Barriers  Deconditioning;Muscular Weakness   Left frozen shoulder   Cardiac Risk Stratification  High       6 Minute Walk: 6 Minute Walk    Row Name 11/03/19 0908         6 Minute Walk   Phase  Initial     Distance  1236 feet     Walk Time  6 minutes     # of Rest Breaks  0     MPH  2.3     METS  3.3     RPE  11     Perceived Dyspnea   0     VO2 Peak  11.56     Symptoms  No     Resting HR  99 bpm     Resting BP  100/54     Resting Oxygen Saturation   98 %     Exercise Oxygen Saturation  during 6 min walk  96 %     Max Ex. HR  106 bpm     Max Ex. BP  116/64     2 Minute Post BP  104/60        Oxygen Initial Assessment:   Oxygen Re-Evaluation:   Oxygen Discharge (Final Oxygen Re-Evaluation):   Initial Exercise Prescription: Initial Exercise Prescription - 11/03/19 0900      Date of Initial Exercise RX and Referring Provider   Date  11/03/19    Referring Provider  Dr. Aundra Dubin    Expected Discharge Date  01/01/20      Treadmill   MPH  2.6    Grade  1    Minutes  15      NuStep   Level  3    SPM  85    Minutes  15    METs  3      Prescription Details   Frequency (times per week)  3    Duration  Progress to 30 minutes of continuous aerobic without signs/symptoms of physical distress      Intensity   THRR 40-80% of Max Heartrate  62-125    Ratings of Perceived Exertion  11-13  Progression   Progression  Continue to progress workloads to maintain intensity without signs/symptoms of physical distress.      Resistance Training   Training Prescription  Yes    Weight  3lbs    Reps  10-15       Perform Capillary  Blood Glucose checks as needed.  Exercise Prescription Changes:   Exercise Comments:   Exercise Goals and Review:  Exercise Goals    Row Name 11/03/19 0912             Exercise Goals   Increase Physical Activity  Yes       Intervention  Provide advice, education, support and counseling about physical activity/exercise needs.;Develop an individualized exercise prescription for aerobic and resistive training based on initial evaluation findings, risk stratification, comorbidities and participant's personal goals.       Expected Outcomes  Short Term: Attend rehab on a regular basis to increase amount of physical activity.;Long Term: Add in home exercise to make exercise part of routine and to increase amount of physical activity.;Long Term: Exercising regularly at least 3-5 days a week.       Increase Strength and Stamina  Yes       Intervention  Provide advice, education, support and counseling about physical activity/exercise needs.;Develop an individualized exercise prescription for aerobic and resistive training based on initial evaluation findings, risk stratification, comorbidities and participant's personal goals.       Expected Outcomes  Short Term: Increase workloads from initial exercise prescription for resistance, speed, and METs.;Short Term: Perform resistance training exercises routinely during rehab and add in resistance training at home;Long Term: Improve cardiorespiratory fitness, muscular endurance and strength as measured by increased METs and functional capacity (6MWT)       Able to understand and use rate of perceived exertion (RPE) scale  Yes       Intervention  Provide education and explanation on how to use RPE scale       Expected Outcomes  Short Term: Able to use RPE daily in rehab to express subjective intensity level;Long Term:  Able to use RPE to guide intensity level when exercising independently       Knowledge and understanding of Target Heart Rate Range (THRR)   Yes       Intervention  Provide education and explanation of THRR including how the numbers were predicted and where they are located for reference       Expected Outcomes  Short Term: Able to state/look up THRR;Long Term: Able to use THRR to govern intensity when exercising independently;Short Term: Able to use daily as guideline for intensity in rehab       Able to check pulse independently  Yes       Intervention  Provide education and demonstration on how to check pulse in carotid and radial arteries.;Review the importance of being able to check your own pulse for safety during independent exercise       Expected Outcomes  Short Term: Able to explain why pulse checking is important during independent exercise;Long Term: Able to check pulse independently and accurately       Understanding of Exercise Prescription  Yes       Intervention  Provide education, explanation, and written materials on patient's individual exercise prescription       Expected Outcomes  Short Term: Able to explain program exercise prescription;Long Term: Able to explain home exercise prescription to exercise independently          Exercise Goals  Re-Evaluation :    Discharge Exercise Prescription (Final Exercise Prescription Changes):   Nutrition:  Target Goals: Understanding of nutrition guidelines, daily intake of sodium 1500mg , cholesterol 200mg , calories 30% from fat and 7% or less from saturated fats, daily to have 5 or more servings of fruits and vegetables.  Biometrics: Pre Biometrics - 11/03/19 0913      Pre Biometrics   Height  5\' 6"  (1.676 m)    Weight  66.6 kg    Waist Circumference  30.5 inches    Hip Circumference  36.5 inches    Waist to Hip Ratio  0.84 %    BMI (Calculated)  23.71    Triceps Skinfold  24 mm    % Body Fat  33.9 %    Grip Strength  31 kg    Flexibility  19 in    Single Leg Stand  30 seconds        Nutrition Therapy Plan and Nutrition Goals:   Nutrition  Assessments:   Nutrition Goals Re-Evaluation:   Nutrition Goals Discharge (Final Nutrition Goals Re-Evaluation):   Psychosocial: Target Goals: Acknowledge presence or absence of significant depression and/or stress, maximize coping skills, provide positive support system. Participant is able to verbalize types and ability to use techniques and skills needed for reducing stress and depression.  Initial Review & Psychosocial Screening: Initial Psych Review & Screening - 11/03/19 1017      Initial Review   Current issues with  Current Stress Concerns;Current Depression;Current Anxiety/Panic;Current Sleep Concerns    Source of Stress Concerns  Chronic Illness;Unable to participate in former interests or hobbies;Unable to perform yard/household activities;Occupation;Retirement/disability    Comments  Teleah continues to struggle with anxiety, depression, and isolation. She states she has a very strong support system however she finds it difficult to "let people in". She does not want to burden others with her difficulties. She is currently applying for long term disability and the process feels overwhelming to her. She has expressed these concerns to her provider and is scheduled for an initial counseling session Jan 11 through social services. She is offered counseling today with spiritual care department but declines.      Family Dynamics   Good Support System?  Yes    Concerns  Inappropriate over/under dependence on family/friends    Comments  She has under dependence on family and friends      Barriers   Psychosocial barriers to participate in program  The patient should benefit from training in stress management and relaxation.;Psychosocial barriers identified (see note)      Screening Interventions   Interventions  Encouraged to exercise;Provide feedback about the scores to participant;To provide support and resources with identified psychosocial needs;Other (comment)    Comments   Encouraged patient to continue discussions with PCP. Patient agreed to allow RN CM to send PHQ score to PCP.    Expected Outcomes  Long Term Goal: Stressors or current issues are controlled or eliminated.;Short Term goal: Identification and review with participant of any Quality of Life or Depression concerns found by scoring the questionnaire.;Short Term goal: Utilizing psychosocial counselor, staff and physician to assist with identification of specific Stressors or current issues interfering with healing process. Setting desired goal for each stressor or current issue identified.;Long Term goal: The participant improves quality of Life and PHQ9 Scores as seen by post scores and/or verbalization of changes       Quality of Life Scores:  Scores of 19 and below usually indicate a poorer  quality of life in these areas.  A difference of  2-3 points is a clinically meaningful difference.  A difference of 2-3 points in the total score of the Quality of Life Index has been associated with significant improvement in overall quality of life, self-image, physical symptoms, and general health in studies assessing change in quality of life.  PHQ-9: Recent Review Flowsheet Data    Depression screen [facility] 2/9 11/03/2019 01/21/2019   Decreased Interest 1 0   Down, Depressed, Hopeless 1 0   PHQ - 2 Score 2 0   Altered sleeping 2 -   Tired, decreased energy 2 -   Change in appetite 1 -   Feeling bad or failure about yourself  1 -   Trouble concentrating 2 -   Moving slowly or fidgety/restless 1 -   Suicidal thoughts 0 -   PHQ-9 Score 11 -   Difficult doing work/chores Somewhat difficult -     Interpretation of Total Score  Total Score Depression Severity:  1-4 = Minimal depression, 5-9 = Mild depression, 10-14 = Moderate depression, 15-19 = Moderately severe depression, 20-27 = Severe depression   Psychosocial Evaluation and Intervention:   Psychosocial Re-Evaluation:   Psychosocial Discharge (Final  Psychosocial Re-Evaluation):   Vocational Rehabilitation: Provide vocational rehab assistance to qualifying candidates.   Vocational Rehab Evaluation & Intervention: Vocational Rehab - 11/03/19 1022      Initial Vocational Rehab Evaluation & Intervention   Assessment shows need for Vocational Rehabilitation  No       Education: Education Goals: Education classes will be provided on a weekly basis, covering required topics. Participant will state understanding/return demonstration of topics presented.  Learning Barriers/Preferences: Learning Barriers/Preferences - 11/03/19 0915      Learning Barriers/Preferences   Learning Barriers  Exercise Concerns   Dizziness   Learning Preferences  Video;Pictoral       Education Topics: Hypertension, Hypertension Reduction -Define heart disease and high blood pressure. Discus how high blood pressure affects the body and ways to reduce high blood pressure.   Exercise and Your Heart -Discuss why it is important to exercise, the FITT principles of exercise, normal and abnormal responses to exercise, and how to exercise safely.   Angina -Discuss definition of angina, causes of angina, treatment of angina, and how to decrease risk of having angina.   Cardiac Medications -Review what the following cardiac medications are used for, how they affect the body, and side effects that may occur when taking the medications.  Medications include Aspirin, Beta blockers, calcium channel blockers, ACE Inhibitors, angiotensin receptor blockers, diuretics, digoxin, and antihyperlipidemics.   Congestive Heart Failure -Discuss the definition of CHF, how to live with CHF, the signs and symptoms of CHF, and how keep track of weight and sodium intake.   Heart Disease and Intimacy -Discus the effect sexual activity has on the heart, how changes occur during intimacy as we age, and safety during sexual activity.   Smoking Cessation / COPD -Discuss  different methods to quit smoking, the health benefits of quitting smoking, and the definition of COPD.   Nutrition I: Fats -Discuss the types of cholesterol, what cholesterol does to the heart, and how cholesterol levels can be controlled.   Nutrition II: Labels -Discuss the different components of food labels and how to read food label   Heart Parts/Heart Disease and PAD -Discuss the anatomy of the heart, the pathway of blood circulation through the heart, and these are affected by heart disease.   Stress  I: Signs and Symptoms -Discuss the causes of stress, how stress may lead to anxiety and depression, and ways to limit stress.   Stress II: Relaxation -Discuss different types of relaxation techniques to limit stress.   Warning Signs of Stroke / TIA -Discuss definition of a stroke, what the signs and symptoms are of a stroke, and how to identify when someone is having stroke.   Knowledge Questionnaire Score:   Core Components/Risk Factors/Patient Goals at Admission: Personal Goals and Risk Factors at Admission - 11/03/19 0915      Core Components/Risk Factors/Patient Goals on Admission    Weight Management  Yes;Weight Maintenance    Intervention  Weight Management: Develop a combined nutrition and exercise program designed to reach desired caloric intake, while maintaining appropriate intake of nutrient and fiber, sodium and fats, and appropriate energy expenditure required for the weight goal.;Weight Management: Provide education and appropriate resources to help participant work on and attain dietary goals.    Admit Weight  146 lb 13.2 oz (66.6 kg)    Expected Outcomes  Short Term: Continue to assess and modify interventions until short term weight is achieved;Weight Maintenance: Understanding of the daily nutrition guidelines, which includes 25-35% calories from fat, 7% or less cal from saturated fats, less than 200mg  cholesterol, less than 1.5gm of sodium, & 5 or more  servings of fruits and vegetables daily;Long Term: Adherence to nutrition and physical activity/exercise program aimed toward attainment of established weight goal;Weight Loss: Understanding of general recommendations for a balanced deficit meal plan, which promotes 1-2 lb weight loss per week and includes a negative energy balance of [phone] kcal/d;Understanding recommendations for meals to include 15-35% energy as protein, 25-35% energy from fat, 35-60% energy from carbohydrates, less than 200mg  of dietary cholesterol, 20-35 gm of total fiber daily;Understanding of distribution of calorie intake throughout the day with the consumption of 4-5 meals/snacks    Diabetes  Yes    Intervention  Provide education about signs/symptoms and action to take for hypo/hyperglycemia.;Provide education about proper nutrition, including hydration, and aerobic/resistive exercise prescription along with prescribed medications to achieve blood glucose in normal ranges: Fasting glucose 65-99 mg/dL    Expected Outcomes  Short Term: Participant verbalizes understanding of the signs/symptoms and immediate care of hyper/hypoglycemia, proper foot care and importance of medication, aerobic/resistive exercise and nutrition plan for blood glucose control.;Long Term: Attainment of HbA1C < 7%.    Heart Failure  Yes    Intervention  Provide a combined exercise and nutrition program that is supplemented with education, support and counseling about heart failure. Directed toward relieving symptoms such as shortness of breath, decreased exercise tolerance, and extremity edema.    Expected Outcomes  Improve functional capacity of life;Short term: Attendance in program 2-3 days a week with increased exercise capacity. Reported lower sodium intake. Reported increased fruit and vegetable intake. Reports medication compliance.;Short term: Daily weights obtained and reported for increase. Utilizing diuretic protocols set by physician.;Long term:  Adoption of self-care skills and reduction of barriers for early signs and symptoms recognition and intervention leading to self-care maintenance.    Hypertension  Yes    Intervention  Provide education on lifestyle modifcations including regular physical activity/exercise, weight management, moderate sodium restriction and increased consumption of fresh fruit, vegetables, and low fat dairy, alcohol moderation, and smoking cessation.;Monitor prescription use compliance.    Expected Outcomes  Short Term: Continued assessment and intervention until BP is < 140/76mm HG in hypertensive participants. < 130/97mm HG in hypertensive participants with diabetes, heart  failure or chronic kidney disease.;Long Term: Maintenance of blood pressure at goal levels.    Lipids  Yes    Intervention  Provide education and support for participant on nutrition & aerobic/resistive exercise along with prescribed medications to achieve LDL 70mg , HDL >40mg .    Expected Outcomes  Short Term: Participant states understanding of desired cholesterol values and is compliant with medications prescribed. Participant is following exercise prescription and nutrition guidelines.;Long Term: Cholesterol controlled with medications as prescribed, with individualized exercise RX and with personalized nutrition plan. Value goals: LDL < 70mg , HDL > 40 mg.    Stress  Yes    Intervention  Offer individual and/or small group education and counseling on adjustment to heart disease, stress management and health-related lifestyle change. Teach and support self-help strategies.;Refer participants experiencing significant psychosocial distress to appropriate mental health specialists for further evaluation and treatment. When possible, include family members and significant others in education/counseling sessions.    Expected Outcomes  Short Term: Participant demonstrates changes in health-related behavior, relaxation and other stress management skills,  ability to obtain effective social support, and compliance with psychotropic medications if prescribed.;Long Term: Emotional wellbeing is indicated by absence of clinically significant psychosocial distress or social isolation.       Core Components/Risk Factors/Patient Goals Review:    Core Components/Risk Factors/Patient Goals at Discharge (Final Review):    ITP Comments: ITP Comments    Row Name 11/03/19 0829           ITP Comments  Dr. Fransico Him, Medical Director Cardiac Rehab [facility]          Comments: Patient attended orientation on 11/03/2019 to review rules and guidelines for program.  Completed 6 minute walk test, Intitial ITP, and exercise prescription.  VSS. Telemetry-NSR with occ PVCs.  Asymptomatic. Safety measures and social distancing in place per CDC guidelines.

## 2019-11-05 ENCOUNTER — Other Ambulatory Visit (HOSPITAL_COMMUNITY): Payer: Self-pay | Admitting: Cardiology

## 2019-11-05 MED FILL — ISOSORBIDE DN 20 MG TABLET: 20 | 30 days supply | Qty: 90 | Fill #0

## 2019-11-09 ENCOUNTER — Encounter (HOSPITAL_COMMUNITY)
Admission: RE | Admit: 2019-11-09 | Discharge: 2019-11-09 | Disposition: A | Discharge status: Home / Self Care | Payer: No Typology Code available for payment source | Source: Ambulatory Visit | Attending: Cardiology | Admitting: Cardiology

## 2019-11-09 ENCOUNTER — Other Ambulatory Visit: Payer: Self-pay

## 2019-11-09 DIAGNOSIS — I5022 Chronic systolic (congestive) heart failure: Secondary | ICD-10-CM | POA: Diagnosis not present

## 2019-11-09 LAB — GLUCOSE, CAPILLARY
Glucose-Capillary: 167 mg/dL — ABNORMAL HIGH (ref 70–99)
Glucose-Capillary: 190 mg/dL — ABNORMAL HIGH (ref 70–99)

## 2019-11-09 NOTE — Progress Notes (Signed)
Daily Session Note  Patient Details  Name: Toni [name] MRN: [id number] Date of Birth: 10-05-1956 Referring Provider:     CARDIAC REHAB PHASE II ORIENTATION from 11/03/2019 in [place]  Referring Provider  Dr. Aundra Dubin      Encounter Date: 11/09/2019  Check In: Session Check In - 11/09/19 0854      Check-In   Supervising physician immediately available to respond to emergencies  [facility] immediately available    Physician(s)  Dr. [place] Crane    Location  [facility]    Staff Present  Barnet Pall, RN, BSN;Brittany Durene Fruits, BS, ACSM CEP, Exercise Physiologist;Olinty Celesta Aver, MS, ACSM CEP, Exercise Physiologist;Dalton Kris Mouton, MS, Exercise Physiologist;Tara Karle Starch, RN, BSN    Virtual Visit  No    Medication changes reported      No    Fall or balance concerns reported     No    Tobacco Cessation  No Change    Warm-up and Cool-down  Performed on first and last piece of equipment    Resistance Training Performed  Yes    VAD Patient?  No    PAD/SET Patient?  No      Pain Assessment   Currently in Pain?  No/denies    Multiple Pain Sites  No       Capillary Blood Glucose: Results for orders placed or performed during the hospital encounter of 11/09/19 (from the past 24 hour(s))  Glucose, capillary     Status: Abnormal   Collection Time: 11/09/19  9:50 AM  Result Value Ref Range   Glucose-Capillary 190 (H) 70 - 99 mg/dL    Exercise Prescription Changes - 11/09/19 1200      Response to Exercise   Blood Pressure (Admit)  116/84    Blood Pressure (Exercise)  114/72    Blood Pressure (Exit)  102/60    Heart Rate (Admit)  98 bpm    Heart Rate (Exercise)  104 bpm    Heart Rate (Exit)  87 bpm    Rating of Perceived Exertion (Exercise)  12    Symptoms  None    Comments  Pt first day of exercise.     Duration  Progress to 30 minutes of  aerobic without signs/symptoms of physical distress    Intensity  THRR unchanged      Progression   Progression  Continue to progress workloads to maintain intensity without signs/symptoms of physical distress.    Average METs  2.9      Resistance Training   Training Prescription  Yes    Weight  3lbs    Reps  10-15    Time  10 Minutes      Interval Training   Interval Training  No      Treadmill   MPH  2.6    Grade  1    Minutes  15    METs  10      NuStep   Level  3    SPM  85    Minutes  15    METs  2.6       Social History   Tobacco Use  Smoking Status Never Smoker  Smokeless Tobacco Never Used    Goals Met:  Exercise tolerated well No report of cardiac concerns or symptoms  Goals Unmet:  Not Applicable  Comments: Pt started cardiac rehab today.  Pt tolerated light exercise without difficulty. VSS, telemetry-Sinus Rhythm, asymptomatic.  Medication list reconciled. Pt denies  barriers to medicaiton compliance.  PSYCHOSOCIAL ASSESSMENT:  PHQ-11. Stephaine has voiced feeling a little depressed regarding her health. Moraima does have supportive family. Francena said that she talked to her primary care provider regarding being depressed last week.   Pt enjoys reading and cooking.   Pt oriented to exercise equipment and routine.    Understanding verbalized.Barnet Pall, RN,BSN 11/09/2019 2:55 PM   Dr. Fransico Him is Medical Director for Cardiac Rehab at [facility].

## 2019-11-10 MED FILL — glipiZIDE ER 10 MG TB24: 10 | 90 days supply | Qty: 180 | Fill #0

## 2019-11-11 ENCOUNTER — Other Ambulatory Visit: Payer: Self-pay

## 2019-11-11 ENCOUNTER — Encounter (HOSPITAL_COMMUNITY)
Admission: RE | Admit: 2019-11-11 | Discharge: 2019-11-11 | Disposition: A | Discharge status: Home / Self Care | Payer: No Typology Code available for payment source | Source: Ambulatory Visit | Attending: Cardiology | Admitting: Cardiology

## 2019-11-11 DIAGNOSIS — I5022 Chronic systolic (congestive) heart failure: Secondary | ICD-10-CM | POA: Diagnosis not present

## 2019-11-11 LAB — GLUCOSE, CAPILLARY
Glucose-Capillary: 177 mg/dL — ABNORMAL HIGH (ref 70–99)
Glucose-Capillary: 179 mg/dL — ABNORMAL HIGH (ref 70–99)

## 2019-11-11 NOTE — Progress Notes (Signed)
Toni [name] 63 y.o. female Nutrition Note  Visit Diagnosis: Chronic systolic CHF (congestive heart failure) ([name])   Past Medical History:  Diagnosis Date  . CHF (congestive heart failure) ([place])   . Diabetes mellitus without complication ([place])   . Hypertension      Medications reviewed.   Current Outpatient Medications:  .  aspirin 81 MG chewable tablet, Chew 1 tablet (81 mg total) by mouth daily., Disp: 30 tablet, Rfl: 0 .  carvedilol (COREG) 12.5 MG tablet, Take 1.5 tablets (18.75 mg total) by mouth 2 (two) times daily with a meal., Disp: 90 tablet, Rfl: 3 .  dapagliflozin propanediol (FARXIGA) 10 MG TABS tablet, Take 10 mg by mouth daily., Disp: , Rfl:  .  digoxin (LANOXIN) 0.125 MG tablet, Take 1 tablet (0.125 mg total) by mouth daily., Disp: 90 tablet, Rfl: 3 .  furosemide (LASIX) 20 MG tablet, TAKE 1 TABLET (20 MG TOTAL) BY MOUTH DAILY., Disp: 30 tablet, Rfl: 5 .  glipiZIDE (GLUCOTROL XL) 10 MG 24 hr tablet, Take 10 mg by mouth 2 (two) times daily with a meal. , Disp: , Rfl:  .  hydrALAZINE (APRESOLINE) 50 MG tablet, Take 1 tablet (50 mg total) by mouth 3 (three) times daily., Disp: 270 tablet, Rfl: 1 .  isosorbide dinitrate (ISORDIL) 20 MG tablet, TAKE 1 TABLET (20 MG TOTAL) BY MOUTH 3 TIMES DAILY., Disp: 90 tablet, Rfl: 0 .  LINZESS 145 MCG CAPS capsule, Take 145 mcg by mouth daily., Disp: , Rfl:  .  liraglutide (VICTOZA) 18 MG/3ML SOPN, Inject 1.8 mg into the skin every evening., Disp: , Rfl:  .  losartan (COZAAR) 25 MG tablet, TAKE 1 TABLET BY MOUTH 2 TIMES A DAY., Disp: 60 tablet, Rfl: 5 .  metFORMIN (GLUCOPHAGE) 1000 MG tablet, Take 1,000 mg by mouth 2 (two) times daily with a meal., Disp: , Rfl:  .  Multiple Vitamin (MULTIVITAMIN WITH MINERALS) TABS tablet, Take 1 tablet by mouth daily., Disp: , Rfl:  .  simvastatin (ZOCOR) 20 MG tablet, Take 20 mg by mouth every evening., Disp: , Rfl:  .  spironolactone (ALDACTONE) 25 MG tablet, TAKE 1 TABLET BY MOUTH EVERY EVENING.,  Disp: 90 tablet, Rfl: 3 .  TRUE METRIX BLOOD GLUCOSE TEST test strip, Use as directed., Disp: , Rfl: 5 .  TRUEPLUS LANCETS 30G MISC, Use as directed., Disp: , Rfl: 5 .  UNIFINE PENTIPS 32G X 4 MM MISC, , Disp: , Rfl:  .  vitamin C (ASCORBIC ACID) 500 MG tablet, Take 500 mg by mouth 2 (two) times daily., Disp: , Rfl:    Ht Readings from Last 1 Encounters:  11/03/19 5\' 6"  (1.676 m)     Wt Readings from Last 3 Encounters:  11/03/19 146 lb 13.2 oz (66.6 kg)  09/30/19 141 lb 12.8 oz (64.3 kg)  09/18/19 146 lb 3.2 oz (66.3 kg)     There is no height or weight on file to calculate BMI.   Social History   Tobacco Use  Smoking Status Never Smoker  Smokeless Tobacco Never Used     Lab Results  Component Value Date   CHOL 182 11/15/2018   Lab Results  Component Value Date   HDL 38 (L) 11/15/2018   Lab Results  Component Value Date   LDLCALC 117 (H) 11/15/2018   Lab Results  Component Value Date   TRIG 134 11/15/2018   Lab Results  Component Value Date   CHOLHDL 4.8 11/15/2018     Lab Results  Component Value Date   HGBA1C 6.9 (H) 11/14/2018     CBG (last 3)  Recent Labs    11/09/19 0843 11/09/19 0950 11/11/19 0948  GLUCAP 167* 190* 179*     Nutrition Note  Spoke with pt. Nutrition Plan and Nutrition Survey goals reviewed with pt. Pt is not following a Heart Healthy diet.   Pt has Type 2 Diabetes. Last A1c indicates blood glucose well-controlled. This Probation officer went over Diabetes Education test results. Pt checks CBG's sporadically. Fasting CBG's reportedly 150-200 mg/dL when she does check. Pt wants have better diabetes management but is feeling overwhelmed with everything going on with her health. Recommended small goals and weekly f/u. Pt to start with checking Fasting CBG 3x/week with goal of increasing to daily.   Pt expressed understanding of the information reviewed.    Nutrition Diagnosis ? Food-and nutrition-related knowledge deficit related to lack  of exposure to information as related to diagnosis of: ? CVD ? Type 2 Diabetes   Nutrition Intervention ? Pt's individual nutrition plan reviewed with pt. ? Benefits of adopting Heart Healthy diet discussed when Med[employer]icts reviewed.   ? Continue client-centered nutrition education by RD, as part of interdisciplinary care.  Goal(s) ? CBG concentrations in the normal range or as close to normal as is safely possible. ? Improved blood glucose control as evidenced by pt's A1c trending from 9.0 toward less than 7.0.  Plan:   Will provide client-centered nutrition education as part of interdisciplinary care  Monitor and evaluate progress toward nutrition goal with team.   Michaele Offer, MS, RDN, LDN

## 2019-11-11 NOTE — Progress Notes (Signed)
Pt is interested in participating in Virtual Cardiac and Pulmonary Rehab. Pt advised that Virtual Cardiac and Pulmonary Rehab is provided at no cost to the patient.  Checklist:  1. Pt has smart device  ie smartphone and/or ipad for downloading an app  Yes 2. Reliable internet/wifi service    Yes 3. Understands how to use their smartphone and navigate within an app.  Yes   Pt verbalized understanding and is in agreement.            Confirm Consent - In the setting of the current Covid19 crisis, you are scheduled for a phone visit with your Cardiac or Pulmonary team member.  Just as we do with many in-gym visits, in order for you to participate in this visit, we must obtain consent.  If you'd like, I can send this to your mychart (if signed up) or email for you to review.  Otherwise, I can obtain your verbal consent now.  By agreeing to a telephone visit, we'd like you to understand that the technology does not allow for your Cardiac or Pulmonary Rehab team member to perform a physical assessment, and thus may limit their ability to fully assess your ability to perform exercise programs. If your provider identifies any concerns that need to be evaluated in person, we will make arrangements to do so.  Finally, though the technology is pretty good, we cannot assure that it will always work on either your or our end and we cannot ensure that we have a secure connection.  Cardiac and Pulmonary Rehab Telehealth visits and "At Home" cardiac and pulmonary rehab are provided at no cost to you.        Are you willing to proceed?"        STAFF: Did the patient verbally acknowledge consent to telehealth visit? Document YES/NO here: Yes     Barnet Pall RN  Cardiac and Pulmonary Rehab Staff        Date 11/11/19    @ Time 1335

## 2019-11-11 NOTE — Progress Notes (Signed)
QUALITY OF LIFE SCORE REVIEW  Michie completed Quality of Life survey as a participant in Cardiac Rehab9.  Scores 21.0 or below are considered low.  Pt score very low in several areas Overall 15.32, Health and Function 13.97, socioeconomic 19.29, physiological and spiritual 13/79, family 16.13. Patient quality of life slightly altered by physical constraints which limits ability to perform as prior to recent cardiac illness.Alyiana voices that she is dissatisfied with her health due to her CHF and diabetes.  Offered emotional support and reassurance.  Will continue to monitor and intervene as necessary. Gazelle has an appointment to meet with a counselor on January 11 th. Nikkita declined to meet with the hospital chaplain. Libbey says she does not feel she is able return to work yet. Will fax quality of life questionnaire to Dr Lynnda Child office for review. Barnet Pall, RN,BSN 11/11/2019 1:16 PM

## 2019-11-16 ENCOUNTER — Encounter (HOSPITAL_COMMUNITY): Payer: No Typology Code available for payment source

## 2019-11-16 ENCOUNTER — Telehealth (HOSPITAL_COMMUNITY): Payer: Self-pay | Admitting: Family Medicine

## 2019-11-18 ENCOUNTER — Other Ambulatory Visit: Payer: Self-pay

## 2019-11-18 ENCOUNTER — Encounter (HOSPITAL_COMMUNITY)
Admission: RE | Admit: 2019-11-18 | Discharge: 2019-11-18 | Disposition: A | Discharge status: Home / Self Care | Payer: No Typology Code available for payment source | Source: Ambulatory Visit | Attending: Cardiology | Admitting: Cardiology

## 2019-11-18 DIAGNOSIS — I5022 Chronic systolic (congestive) heart failure: Secondary | ICD-10-CM

## 2019-11-18 LAB — GLUCOSE, CAPILLARY: Glucose-Capillary: 155 mg/dL — ABNORMAL HIGH (ref 70–99)

## 2019-11-18 MED FILL — LOSARTAN POTASSIUM 25 MG TA: 25 | 30 days supply | Qty: 60 | Fill #1

## 2019-11-18 MED FILL — FARXIGA 10 MG TABLET: 10 | 30 days supply | Qty: 30 | Fill #4

## 2019-11-18 MED FILL — FUROSEMIDE 20 MG TABS: 20 | 30 days supply | Qty: 30 | Fill #1

## 2019-11-18 MED FILL — SIMVASTATIN 20 MG TABLET: 20 | 90 days supply | Qty: 90 | Fill #0

## 2019-11-19 ENCOUNTER — Telehealth (HOSPITAL_COMMUNITY): Payer: Self-pay

## 2019-11-19 NOTE — Telephone Encounter (Signed)
Received a Request for Medical Records from [facility]. Faxed request to [phone].

## 2019-11-20 DIAGNOSIS — C50919 Malignant neoplasm of unspecified site of unspecified female breast: Secondary | ICD-10-CM

## 2019-11-20 HISTORY — DX: Malignant neoplasm of unspecified site of unspecified female breast: C50.919

## 2019-11-23 ENCOUNTER — Inpatient Hospital Stay (HOSPITAL_COMMUNITY)
Admission: RE | Admit: 2019-11-23 | Discharge: 2019-11-23 | Disposition: A | Discharge status: Home / Self Care | Payer: No Typology Code available for payment source | Source: Ambulatory Visit

## 2019-11-23 ENCOUNTER — Other Ambulatory Visit: Payer: Self-pay

## 2019-11-23 ENCOUNTER — Encounter (HOSPITAL_COMMUNITY): Payer: No Typology Code available for payment source

## 2019-11-23 DIAGNOSIS — I5022 Chronic systolic (congestive) heart failure: Secondary | ICD-10-CM | POA: Insufficient documentation

## 2019-11-23 NOTE — Progress Notes (Signed)
Nutrition Note: Virtual Visit  Week 1 Spoke with pt for virtual cardiac rehab. Pt is exercising at home starting today. She is exercising for 30 minutes/session plus stretches. Pt is reporting exercise in virtual app. Pt denies adverse symptoms with exercise.   Diet recall reviewed. Pt denies any problems with medications.  Nutrition goals reviewed. Activity: record fasting CBG at minimum 3d/week (pt avoids checking at all when she feels overwhelmed), find plate method and use it for dinner, start reading labels for carbohydrates and record in log. Handouts: [employer] education and goals with summary email.  Will continue to monitor pt with weekly phone calls for virtual cardiac rehab.   Michaele Offer, MS, RDN, LDN

## 2019-11-23 NOTE — Progress Notes (Signed)
Cardiac Individual Treatment Plan  Patient Details  Name: Toni [name] MRN: [id number] Date of Birth: 05-Dec-1955 Referring Provider:     CARDIAC REHAB PHASE II ORIENTATION from 11/03/2019 in [facility]  Referring Provider  Dr. Aundra Dubin      Initial Encounter Date:    CARDIAC REHAB PHASE II ORIENTATION from 11/03/2019 in [facility]  Date  11/03/19      Visit Diagnosis: Chronic systolic CHF (congestive heart failure) ([place])  Patient's Home Medications on Admission:  Current Outpatient Medications:  .  aspirin 81 MG chewable tablet, Chew 1 tablet (81 mg total) by mouth daily., Disp: 30 tablet, Rfl: 0 .  carvedilol (COREG) 12.5 MG tablet, Take 1.5 tablets (18.75 mg total) by mouth 2 (two) times daily with a meal., Disp: 90 tablet, Rfl: 3 .  dapagliflozin propanediol (FARXIGA) 10 MG TABS tablet, Take 10 mg by mouth daily., Disp: , Rfl:  .  digoxin (LANOXIN) 0.125 MG tablet, Take 1 tablet (0.125 mg total) by mouth daily., Disp: 90 tablet, Rfl: 3 .  furosemide (LASIX) 20 MG tablet, TAKE 1 TABLET (20 MG TOTAL) BY MOUTH DAILY., Disp: 30 tablet, Rfl: 5 .  glipiZIDE (GLUCOTROL XL) 10 MG 24 hr tablet, Take 10 mg by mouth 2 (two) times daily with a meal. , Disp: , Rfl:  .  hydrALAZINE (APRESOLINE) 50 MG tablet, Take 1 tablet (50 mg total) by mouth 3 (three) times daily., Disp: 270 tablet, Rfl: 1 .  isosorbide dinitrate (ISORDIL) 20 MG tablet, TAKE 1 TABLET (20 MG TOTAL) BY MOUTH 3 TIMES DAILY., Disp: 90 tablet, Rfl: 0 .  LINZESS 145 MCG CAPS capsule, Take 145 mcg by mouth daily., Disp: , Rfl:  .  liraglutide (VICTOZA) 18 MG/3ML SOPN, Inject 1.8 mg into the skin every evening., Disp: , Rfl:  .  losartan (COZAAR) 25 MG tablet, TAKE 1 TABLET BY MOUTH 2 TIMES A DAY., Disp: 60 tablet, Rfl: 5 .  metFORMIN (GLUCOPHAGE) 1000 MG tablet, Take 1,000 mg by mouth 2 (two) times daily with a meal., Disp: , Rfl:  .  Multiple Vitamin (MULTIVITAMIN WITH  MINERALS) TABS tablet, Take 1 tablet by mouth daily., Disp: , Rfl:  .  simvastatin (ZOCOR) 20 MG tablet, Take 20 mg by mouth every evening., Disp: , Rfl:  .  spironolactone (ALDACTONE) 25 MG tablet, TAKE 1 TABLET BY MOUTH EVERY EVENING., Disp: 90 tablet, Rfl: 3 .  TRUE METRIX BLOOD GLUCOSE TEST test strip, Use as directed., Disp: , Rfl: 5 .  TRUEPLUS LANCETS 30G MISC, Use as directed., Disp: , Rfl: 5 .  UNIFINE PENTIPS 32G X 4 MM MISC, , Disp: , Rfl:  .  vitamin C (ASCORBIC ACID) 500 MG tablet, Take 500 mg by mouth 2 (two) times daily., Disp: , Rfl:   Past Medical History: Past Medical History:  Diagnosis Date  . CHF (congestive heart failure) ([place])   . Diabetes mellitus without complication ([place])   . Hypertension     Tobacco Use: Social History   Tobacco Use  Smoking Status Never Smoker  Smokeless Tobacco Never Used    Labs: Recent Review Flowsheet Data    Labs for ITP Cardiac and Pulmonary Rehab Latest Ref Rng & Units 10/26/2016 11/14/2018 11/15/2018 11/17/2018 11/17/2018   Cholestrol 0 - 200 mg/dL - - 182 - -   LDLCALC 0 - 99 mg/dL - - 117(H) - -   HDL >40 mg/dL - - 38(L) - -   Trlycerides <  150 mg/dL - - 134 - -   Hemoglobin A1c 4.8 - 5.6 % 7.6 6.9(H) - - -   PHART 7.350 - 7.450 - - - - 7.307(L)   PCO2ART 32.0 - 48.0 mmHg - - - - 43.9   HCO3 20.0 - 28.0 mmol/L - - - 25.5 22.0   TCO2 22 - 32 mmol/L - - - 27 23   ACIDBASEDEF 0.0 - 2.0 mmol/L - - - 1.0 4.0(H)   O2SAT % - - - 66.0 93.0      Capillary Blood Glucose: Lab Results  Component Value Date   GLUCAP 155 (H) 11/18/2019   GLUCAP 179 (H) 11/11/2019   GLUCAP 177 (H) 11/11/2019   GLUCAP 190 (H) 11/09/2019   GLUCAP 167 (H) 11/09/2019     Exercise Target Goals: Exercise Program Goal: Individual exercise prescription set using results from initial 6 min walk test and THRR while considering  patient's activity barriers and safety.   Exercise Prescription Goal: Starting with aerobic activity 30 plus minutes a day,  3 days per week for initial exercise prescription. Provide home exercise prescription and guidelines that participant acknowledges understanding prior to discharge.  Activity Barriers & Risk Stratification: Activity Barriers & Cardiac Risk Stratification - 11/03/19 0910      Activity Barriers & Cardiac Risk Stratification   Activity Barriers  Deconditioning;Muscular Weakness   Left frozen shoulder   Cardiac Risk Stratification  High       6 Minute Walk: 6 Minute Walk    Row Name 11/03/19 0908         6 Minute Walk   Phase  Initial     Distance  1236 feet     Walk Time  6 minutes     # of Rest Breaks  0     MPH  2.3     METS  3.3     RPE  11     Perceived Dyspnea   0     VO2 Peak  11.56     Symptoms  No     Resting HR  99 bpm     Resting BP  100/54     Resting Oxygen Saturation   98 %     Exercise Oxygen Saturation  during 6 min walk  96 %     Max Ex. HR  106 bpm     Max Ex. BP  116/64     2 Minute Post BP  104/60        Oxygen Initial Assessment:   Oxygen Re-Evaluation:   Oxygen Discharge (Final Oxygen Re-Evaluation):   Initial Exercise Prescription: Initial Exercise Prescription - 11/03/19 0900      Date of Initial Exercise RX and Referring Provider   Date  11/03/19    Referring Provider  Dr. Aundra Dubin    Expected Discharge Date  01/01/20      Treadmill   MPH  2.6    Grade  1    Minutes  15      NuStep   Level  3    SPM  85    Minutes  15    METs  3      Prescription Details   Frequency (times per week)  3    Duration  Progress to 30 minutes of continuous aerobic without signs/symptoms of physical distress      Intensity   THRR 40-80% of Max Heartrate  62-125    Ratings of Perceived Exertion  11-13  Progression   Progression  Continue to progress workloads to maintain intensity without signs/symptoms of physical distress.      Resistance Training   Training Prescription  Yes    Weight  3lbs    Reps  10-15       Perform Capillary  Blood Glucose checks as needed.  Exercise Prescription Changes: Exercise Prescription Changes    Row Name 11/09/19 1200 11/18/19 0845           Response to Exercise   Blood Pressure (Admit)  116/84  120/72      Blood Pressure (Exercise)  114/72  118/64      Blood Pressure (Exit)  102/60  100/70      Heart Rate (Admit)  98 bpm  77 bpm      Heart Rate (Exercise)  104 bpm  113 bpm      Heart Rate (Exit)  87 bpm  84 bpm      Rating of Perceived Exertion (Exercise)  12  12      Symptoms  None  None      Comments  Pt first day of exercise.   Pt last day for in person CR. Transitioning to virtual CR.       Duration  Progress to 30 minutes of  aerobic without signs/symptoms of physical distress  Progress to 30 minutes of  aerobic without signs/symptoms of physical distress      Intensity  THRR unchanged  THRR unchanged        Progression   Progression  Continue to progress workloads to maintain intensity without signs/symptoms of physical distress.  Continue to progress workloads to maintain intensity without signs/symptoms of physical distress.      Average METs  2.9  2.7        Resistance Training   Training Prescription  Yes  No      Weight  3lbs  --      Reps  10-15  --      Time  10 Minutes  --        Interval Training   Interval Training  No  No        Treadmill   MPH  2.6  2.6      Grade  1  1      Minutes  15  15      METs  10  3.35        NuStep   Level  3  3      SPM  85  85      Minutes  15  15      METs  2.6  2.2         Exercise Comments: Exercise Comments    Row Name 11/09/19 1454 11/19/19 1041         Exercise Comments  Pt first day of exercise in CR program. Pt tolerated exercise well.  Pt is progressing well in the program. Pt wants to participate in virtual CR prorgram. Pt completed 3 sessions and is now active in the virutal CR program.         Exercise Goals and Review: Exercise Goals    Row Name 11/03/19 0912             Exercise Goals    Increase Physical Activity  Yes       Intervention  Provide advice, education, support and counseling about physical activity/exercise needs.;Develop an individualized exercise prescription for aerobic and resistive training  based on initial evaluation findings, risk stratification, comorbidities and participant's personal goals.       Expected Outcomes  Short Term: Attend rehab on a regular basis to increase amount of physical activity.;Long Term: Add in home exercise to make exercise part of routine and to increase amount of physical activity.;Long Term: Exercising regularly at least 3-5 days a week.       Increase Strength and Stamina  Yes       Intervention  Provide advice, education, support and counseling about physical activity/exercise needs.;Develop an individualized exercise prescription for aerobic and resistive training based on initial evaluation findings, risk stratification, comorbidities and participant's personal goals.       Expected Outcomes  Short Term: Increase workloads from initial exercise prescription for resistance, speed, and METs.;Short Term: Perform resistance training exercises routinely during rehab and add in resistance training at home;Long Term: Improve cardiorespiratory fitness, muscular endurance and strength as measured by increased METs and functional capacity (6MWT)       Able to understand and use rate of perceived exertion (RPE) scale  Yes       Intervention  Provide education and explanation on how to use RPE scale       Expected Outcomes  Short Term: Able to use RPE daily in rehab to express subjective intensity level;Long Term:  Able to use RPE to guide intensity level when exercising independently       Knowledge and understanding of Target Heart Rate Range (THRR)  Yes       Intervention  Provide education and explanation of THRR including how the numbers were predicted and where they are located for reference       Expected Outcomes  Short Term: Able to  state/look up THRR;Long Term: Able to use THRR to govern intensity when exercising independently;Short Term: Able to use daily as guideline for intensity in rehab       Able to check pulse independently  Yes       Intervention  Provide education and demonstration on how to check pulse in carotid and radial arteries.;Review the importance of being able to check your own pulse for safety during independent exercise       Expected Outcomes  Short Term: Able to explain why pulse checking is important during independent exercise;Long Term: Able to check pulse independently and accurately       Understanding of Exercise Prescription  Yes       Intervention  Provide education, explanation, and written materials on patient's individual exercise prescription       Expected Outcomes  Short Term: Able to explain program exercise prescription;Long Term: Able to explain home exercise prescription to exercise independently          Exercise Goals Re-Evaluation : Exercise Goals Re-Evaluation    [place] Name 11/09/19 1453 11/19/19 1038           Exercise Goal Re-Evaluation   Exercise Goals Review  Increase Physical Activity;Increase Strength and Stamina;Able to understand and use rate of perceived exertion (RPE) scale;Knowledge and understanding of Target Heart Rate Range (THRR);Understanding of Exercise Prescription  Increase Physical Activity;Increase Strength and Stamina;Able to understand and use rate of perceived exertion (RPE) scale;Knowledge and understanding of Target Heart Rate Range (THRR);Able to check pulse independently;Understanding of Exercise Prescription      Comments  Pt first day of exercise in CR program today. Pt tolerated exercise well and understands THRR and RPE scale.  Pt is progressing well in the program. Pt has  a MET level of 2.7 and has completed 3 sessions. Pt was offered virtual CR program due to department closure for [place]. Pt wanted to participate in the virtual CR program. Pt was  set up in the program and is now active in the app. Pt will continue exercising at home and logging exercise in the app.      Expected Outcomes  Will continue to monitor and progress Pt as tolerated.  Pt will continue exercising at home using the virtual CR program.          Discharge Exercise Prescription (Final Exercise Prescription Changes): Exercise Prescription Changes - 11/18/19 0845      Response to Exercise   Blood Pressure (Admit)  120/72    Blood Pressure (Exercise)  118/64    Blood Pressure (Exit)  100/70    Heart Rate (Admit)  77 bpm    Heart Rate (Exercise)  113 bpm    Heart Rate (Exit)  84 bpm    Rating of Perceived Exertion (Exercise)  12    Symptoms  None    Comments  Pt last day for in person CR. Transitioning to virtual CR.     Duration  Progress to 30 minutes of  aerobic without signs/symptoms of physical distress    Intensity  THRR unchanged      Progression   Progression  Continue to progress workloads to maintain intensity without signs/symptoms of physical distress.    Average METs  2.7      Resistance Training   Training Prescription  No      Interval Training   Interval Training  No      Treadmill   MPH  2.6    Grade  1    Minutes  15    METs  3.35      NuStep   Level  3    SPM  85    Minutes  15    METs  2.2       Nutrition:  Target Goals: Understanding of nutrition guidelines, daily intake of sodium <1552m, cholesterol <2077m calories 30% from fat and 7% or less from saturated fats, daily to have 5 or more servings of fruits and vegetables.  Biometrics: Pre Biometrics - 11/03/19 0913      Pre Biometrics   Height  _0  (1.676 m)    Weight  66.6 kg    Waist Circumference  30.5 inches    Hip Circumference  36.5 inches    Waist to Hip Ratio  0.84 %    BMI (Calculated)  23.71    Triceps Skinfold  24 mm    % Body Fat  33.9 %    Grip Strength  31 kg    Flexibility  19 in    Single Leg Stand  30 seconds        Nutrition Therapy  Plan and Nutrition Goals: Nutrition Therapy & Goals - 11/11/19 1347      Nutrition Therapy   Diet  heart healthy, carb modified      Personal Nutrition Goals   Nutrition Goal  Improved blood glucose control as evidenced by pt's A1c trending from 9.0 toward less than 7.0.    Personal Goal #2  Pt to check blood sugars daily    Personal Goal #3  CBG concentrations in the normal range or as close to normal as is safely possible.      Intervention Plan   Intervention  Prescribe, educate and counsel regarding individualized  specific dietary modifications aiming towards targeted core components such as weight, hypertension, lipid management, diabetes, heart failure and other comorbidities.;Nutrition handout(s) given to patient.    Expected Outcomes  Short Term Goal: A plan has been developed with personal nutrition goals set during dietitian appointment.;Long Term Goal: Adherence to prescribed nutrition plan.       Nutrition Assessments: Nutrition Assessments - 11/11/19 1404      MEDFICTS Scores   Pre Score  66       Nutrition Goals Re-Evaluation: Nutrition Goals Re-Evaluation    Row Name 11/11/19 1400             Goals   Current Weight  146 lb (66.2 kg)       Nutrition Goal  Improved blood glucose control as evidenced by pt's A1c trending from 9.0 toward less than 7.0.         Personal Goal #2 Re-Evaluation   Personal Goal #2  Pt to check blood sugars daily         Personal Goal #3 Re-Evaluation   Personal Goal #3  CBG concentrations in the normal range or as close to normal as is safely possible.          Nutrition Goals Discharge (Final Nutrition Goals Re-Evaluation): Nutrition Goals Re-Evaluation - 11/11/19 1400      Goals   Current Weight  146 lb (66.2 kg)    Nutrition Goal  Improved blood glucose control as evidenced by pt's A1c trending from 9.0 toward less than 7.0.      Personal Goal #2 Re-Evaluation   Personal Goal #2  Pt to check blood sugars daily       Personal Goal #3 Re-Evaluation   Personal Goal #3  CBG concentrations in the normal range or as close to normal as is safely possible.       Psychosocial: Target Goals: Acknowledge presence or absence of significant depression and/or stress, maximize coping skills, provide positive support system. Participant is able to verbalize types and ability to use techniques and skills needed for reducing stress and depression.  Initial Review & Psychosocial Screening: Initial Psych Review & Screening - 11/03/19 1017      Initial Review   Current issues with  Current Stress Concerns;Current Depression;Current Anxiety/Panic;Current Sleep Concerns    Source of Stress Concerns  Chronic Illness;Unable to participate in former interests or hobbies;Unable to perform yard/household activities;Occupation;Retirement/disability    Comments  Velmer continues to struggle with anxiety, depression, and isolation. She states she has a very strong support system however she finds it difficult to "let people in". She does not want to burden others with her difficulties. She is currently applying for long term disability and the process feels overwhelming to her. She has expressed these concerns to her provider and is scheduled for an initial counseling session Jan 11 through social services. She is offered counseling today with spiritual care department but declines.      Family Dynamics   Good Support System?  Yes    Concerns  Inappropriate over/under dependence on family/friends    Comments  She has under dependence on family and friends      Barriers   Psychosocial barriers to participate in program  The patient should benefit from training in stress management and relaxation.;Psychosocial barriers identified (see note)      Screening Interventions   Interventions  Encouraged to exercise;Provide feedback about the scores to participant;To provide support and resources with identified psychosocial needs;Other  (comment)  Comments  Encouraged patient to continue discussions with PCP. Patient agreed to allow RN CM to send PHQ score to PCP.    Expected Outcomes  Long Term Goal: Stressors or current issues are controlled or eliminated.;Short Term goal: Identification and review with participant of any Quality of Life or Depression concerns found by scoring the questionnaire.;Short Term goal: Utilizing psychosocial counselor, staff and physician to assist with identification of specific Stressors or current issues interfering with healing process. Setting desired goal for each stressor or current issue identified.;Long Term goal: The participant improves quality of Life and PHQ9 Scores as seen by post scores and/or verbalization of changes       Quality of Life Scores: Quality of Life - 11/03/19 1028      Quality of Life   Select  Quality of Life      Quality of Life Scores   Health/Function Pre  13.97 %    Socioeconomic Pre  19.29 %    Psych/Spiritual Pre  13.79 %    Family Pre  16.13 %    GLOBAL Pre  15.32 %      Scores of 19 and below usually indicate a poorer quality of life in these areas.  A difference of  2-3 points is a clinically meaningful difference.  A difference of 2-3 points in the total score of the Quality of Life Index has been associated with significant improvement in overall quality of life, self-image, physical symptoms, and general health in studies assessing change in quality of life.  PHQ-9: Recent Review Flowsheet Data    Depression screen [facility] 2/9 11/03/2019 01/21/2019   Decreased Interest 1 0   Down, Depressed, Hopeless 1 0   PHQ - 2 Score 2 0   Altered sleeping 2 -   Tired, decreased energy 2 -   Change in appetite 1 -   Feeling bad or failure about yourself  1 -   Trouble concentrating 2 -   Moving slowly or fidgety/restless 1 -   Suicidal thoughts 0 -   PHQ-9 Score 11 -   Difficult doing work/chores Somewhat difficult -     Interpretation of Total Score   Total Score Depression Severity:  1-4 = Minimal depression, 5-9 = Mild depression, 10-14 = Moderate depression, 15-19 = Moderately severe depression, 20-27 = Severe depression   Psychosocial Evaluation and Intervention:   Psychosocial Re-Evaluation: Psychosocial Re-Evaluation    [place] Name 11/12/19 [phone]             Psychosocial Re-Evaluation   Current issues with  Current Depression;Current Anxiety/Panic;Current Stress Concerns;Current Sleep Concerns       Comments  Fadumo continues to have stress concerns regarding her health including her CHF diagnosis and diabetes. Ashleynicole does not think she can return to work and is worried about her job status       Expected Outcomes  Patient will have better stress managment techniques upon graduation from phase 2 cardiac rehab       Interventions  Encouraged to attend Cardiac Rehabilitation for the exercise;Stress management education       Continue Psychosocial Services   Follow up required by staff       Comments  Caitlyne continues to struggle with anxiety, depression, and isolation. She states she has a very strong support system however she finds it difficult to "let people in". She does not want to burden others with her difficulties. She is currently applying for long term disability and the process feels overwhelming to  her. She has expressed these concerns to her provider and is scheduled for an initial counseling session Jan 11 through social services. She is offered counseling today with spiritual care department but declines.         Initial Review   Source of Stress Concerns  Chronic Illness;Unable to participate in former interests or hobbies;Unable to perform yard/household activities;Occupation;Retirement/disability          Psychosocial Discharge (Final Psychosocial Re-Evaluation): Psychosocial Re-Evaluation - 11/12/19 0936      Psychosocial Re-Evaluation   Current issues with  Current Depression;Current Anxiety/Panic;Current  Stress Concerns;Current Sleep Concerns    Comments  Adora continues to have stress concerns regarding her health including her CHF diagnosis and diabetes. Cece does not think she can return to work and is worried about her job status    Expected Outcomes  Patient will have better stress managment techniques upon graduation from phase 2 cardiac rehab    Interventions  Encouraged to attend Cardiac Rehabilitation for the exercise;Stress management education    Continue Psychosocial Services   Follow up required by staff    Comments  Kimi continues to struggle with anxiety, depression, and isolation. She states she has a very strong support system however she finds it difficult to "let people in". She does not want to burden others with her difficulties. She is currently applying for long term disability and the process feels overwhelming to her. She has expressed these concerns to her provider and is scheduled for an initial counseling session Jan 11 through social services. She is offered counseling today with spiritual care department but declines.      Initial Review   Source of Stress Concerns  Chronic Illness;Unable to participate in former interests or hobbies;Unable to perform yard/household activities;Occupation;Retirement/disability       Vocational Rehabilitation: Provide vocational rehab assistance to qualifying candidates.   Vocational Rehab Evaluation & Intervention: Vocational Rehab - 11/03/19 1022      Initial Vocational Rehab Evaluation & Intervention   Assessment shows need for Vocational Rehabilitation  No       Education: Education Goals: Education classes will be provided on a weekly basis, covering required topics. Participant will state understanding/return demonstration of topics presented.  Learning Barriers/Preferences: Learning Barriers/Preferences - 11/03/19 1022      Learning Barriers/Preferences   Learning Barriers  Exercise Concerns    Learning  Preferences  Written Material       Education Topics: Hypertension, Hypertension Reduction -Define heart disease and high blood pressure. Discus how high blood pressure affects the body and ways to reduce high blood pressure.   Exercise and Your Heart -Discuss why it is important to exercise, the FITT principles of exercise, normal and abnormal responses to exercise, and how to exercise safely.   Angina -Discuss definition of angina, causes of angina, treatment of angina, and how to decrease risk of having angina.   Cardiac Medications -Review what the following cardiac medications are used for, how they affect the body, and side effects that may occur when taking the medications.  Medications include Aspirin, Beta blockers, calcium channel blockers, ACE Inhibitors, angiotensin receptor blockers, diuretics, digoxin, and antihyperlipidemics.   Congestive Heart Failure -Discuss the definition of CHF, how to live with CHF, the signs and symptoms of CHF, and how keep track of weight and sodium intake.   Heart Disease and Intimacy -Discus the effect sexual activity has on the heart, how changes occur during intimacy as we age, and safety during sexual activity.  Smoking Cessation / COPD -Discuss different methods to quit smoking, the health benefits of quitting smoking, and the definition of COPD.   Nutrition I: Fats -Discuss the types of cholesterol, what cholesterol does to the heart, and how cholesterol levels can be controlled.   Nutrition II: Labels -Discuss the different components of food labels and how to read food label   Heart Parts/Heart Disease and PAD -Discuss the anatomy of the heart, the pathway of blood circulation through the heart, and these are affected by heart disease.   Stress I: Signs and Symptoms -Discuss the causes of stress, how stress may lead to anxiety and depression, and ways to limit stress.   Stress II: Relaxation -Discuss different types  of relaxation techniques to limit stress.   Warning Signs of Stroke / TIA -Discuss definition of a stroke, what the signs and symptoms are of a stroke, and how to identify when someone is having stroke.   Knowledge Questionnaire Score: Knowledge Questionnaire Score - 11/03/19 1028      Knowledge Questionnaire Score   Pre Score  24/24       Core Components/Risk Factors/Patient Goals at Admission: Personal Goals and Risk Factors at Admission - 11/03/19 0915      Core Components/Risk Factors/Patient Goals on Admission    Weight Management  Yes;Weight Maintenance    Intervention  Weight Management: Develop a combined nutrition and exercise program designed to reach desired caloric intake, while maintaining appropriate intake of nutrient and fiber, sodium and fats, and appropriate energy expenditure required for the weight goal.;Weight Management: Provide education and appropriate resources to help participant work on and attain dietary goals.    Admit Weight  146 lb 13.2 oz (66.6 kg)    Expected Outcomes  Short Term: Continue to assess and modify interventions until short term weight is achieved;Weight Maintenance: Understanding of the daily nutrition guidelines, which includes 25-35% calories from fat, 7% or less cal from saturated fats, less than 225m cholesterol, less than 1.5gm of sodium, & 5 or more servings of fruits and vegetables daily;Long Term: Adherence to nutrition and physical activity/exercise program aimed toward attainment of established weight goal;Weight Loss: Understanding of general recommendations for a balanced deficit meal plan, which promotes 1-2 lb weight loss per week and includes a negative energy balance of [phone] kcal/d;Understanding recommendations for meals to include 15-35% energy as protein, 25-35% energy from fat, 35-60% energy from carbohydrates, less than 2064mof dietary cholesterol, 20-35 gm of total fiber daily;Understanding of distribution of calorie  intake throughout the day with the consumption of 4-5 meals/snacks    Diabetes  Yes    Intervention  Provide education about signs/symptoms and action to take for hypo/hyperglycemia.;Provide education about proper nutrition, including hydration, and aerobic/resistive exercise prescription along with prescribed medications to achieve blood glucose in normal ranges: Fasting glucose 65-99 mg/dL    Expected Outcomes  Short Term: Participant verbalizes understanding of the signs/symptoms and immediate care of hyper/hypoglycemia, proper foot care and importance of medication, aerobic/resistive exercise and nutrition plan for blood glucose control.;Long Term: Attainment of HbA1C < 7%.    Heart Failure  Yes    Intervention  Provide a combined exercise and nutrition program that is supplemented with education, support and counseling about heart failure. Directed toward relieving symptoms such as shortness of breath, decreased exercise tolerance, and extremity edema.    Expected Outcomes  Improve functional capacity of life;Short term: Attendance in program 2-3 days a week with increased exercise capacity. Reported lower sodium intake. Reported  increased fruit and vegetable intake. Reports medication compliance.;Short term: Daily weights obtained and reported for increase. Utilizing diuretic protocols set by physician.;Long term: Adoption of self-care skills and reduction of barriers for early signs and symptoms recognition and intervention leading to self-care maintenance.    Hypertension  Yes    Intervention  Provide education on lifestyle modifcations including regular physical activity/exercise, weight management, moderate sodium restriction and increased consumption of fresh fruit, vegetables, and low fat dairy, alcohol moderation, and smoking cessation.;Monitor prescription use compliance.    Expected Outcomes  Short Term: Continued assessment and intervention until BP is < 140/103m HG in hypertensive  participants. < 130/851mHG in hypertensive participants with diabetes, heart failure or chronic kidney disease.;Long Term: Maintenance of blood pressure at goal levels.    Lipids  Yes    Intervention  Provide education and support for participant on nutrition & aerobic/resistive exercise along with prescribed medications to achieve LDL <7061mHDL >61m110m  Expected Outcomes  Short Term: Participant states understanding of desired cholesterol values and is compliant with medications prescribed. Participant is following exercise prescription and nutrition guidelines.;Long Term: Cholesterol controlled with medications as prescribed, with individualized exercise RX and with personalized nutrition plan. Value goals: LDL < 70mg63mL > 40 mg.    Stress  Yes    Intervention  Offer individual and/or small group education and counseling on adjustment to heart disease, stress management and health-related lifestyle change. Teach and support self-help strategies.;Refer participants experiencing significant psychosocial distress to appropriate mental health specialists for further evaluation and treatment. When possible, include family members and significant others in education/counseling sessions.    Expected Outcomes  Short Term: Participant demonstrates changes in health-related behavior, relaxation and other stress management skills, ability to obtain effective social support, and compliance with psychotropic medications if prescribed.;Long Term: Emotional wellbeing is indicated by absence of clinically significant psychosocial distress or social isolation.       Core Components/Risk Factors/Patient Goals Review:  Goals and Risk Factor Review    Row Name 11/12/19 0955             Core Components/Risk Factors/Patient Goals Review   Personal Goals Review  Weight Management/Obesity;Heart Failure;Lipids;Stress;Hypertension;Diabetes       Review  SharoTuleented exercise on 11/09/19 and has done well with  exercise. Nakema's vital signs and CBG's have been stable       Expected Outcomes  SharoTorianna partcipate in phase 2 cardiac rehab until December 30. SharoPaidyn transition to viirtual phase 2 cardiac rehab due to the [place]          Core Components/Risk Factors/Patient Goals at Discharge (Final Review):  Goals and Risk Factor Review - 11/12/19 0955      Core Components/Risk Factors/Patient Goals Review   Personal Goals Review  Weight Management/Obesity;Heart Failure;Lipids;Stress;Hypertension;Diabetes    Review  SharoJonathonted exercise on 11/09/19 and has done well with exercise. [name]'s vital signs and CBG's have been stable    Expected Outcomes  SharoKesi partcipate in phase 2 cardiac rehab until December 30. SharoLynita transition to viirtual phase 2 cardiac rehab due to the COVID 19 pandemic       ITP Comments: ITP Comments    Row Name 11/03/19 0829 11/23/19 0925         ITP Comments  Dr. TraciFransico Himical Director Cardiac Rehab [place]  30 Day ITP Review. SharoVaniyaoff to a good start to exercise. SharoJaqulyntransitoned to virtual cardiac rehab  as in person cardiac rehab has been placed on hold due to the [place] 19 pandemic. [place] plans to use the virtual CR APP will continue to monitor virtually.         Comments: See ITP comments.Barnet Pall, RN,BSN 11/23/2019 9:36 AM

## 2019-11-25 ENCOUNTER — Encounter (HOSPITAL_COMMUNITY): Payer: No Typology Code available for payment source

## 2019-11-27 ENCOUNTER — Encounter (HOSPITAL_COMMUNITY): Payer: No Typology Code available for payment source

## 2019-11-30 ENCOUNTER — Other Ambulatory Visit: Payer: Self-pay

## 2019-11-30 ENCOUNTER — Inpatient Hospital Stay (HOSPITAL_COMMUNITY)
Admission: RE | Admit: 2019-11-30 | Discharge: 2019-11-30 | Disposition: A | Discharge status: Home / Self Care | Payer: No Typology Code available for payment source | Source: Ambulatory Visit

## 2019-11-30 ENCOUNTER — Other Ambulatory Visit (HOSPITAL_COMMUNITY): Payer: Self-pay | Admitting: Cardiology

## 2019-11-30 ENCOUNTER — Encounter (HOSPITAL_COMMUNITY): Payer: No Typology Code available for payment source

## 2019-11-30 MED FILL — CARVEDILOL 12.5 MG TABLET: 12.5 | 30 days supply | Qty: 90 | Fill #0

## 2019-11-30 NOTE — Progress Notes (Signed)
Nutrition Note: Virtual Visit  Week 2 Spoke with pt for virtual cardiac rehab. Pt is exercising at home about 4-5 days/week for 30 minutes/session. Pt is reporting exercise in virtual app. Pt denies symptoms.   Diet recall reviewed. Pt denies any problems with medications.  Nutrition goals reviewed. Pt met goals from last week. She checked her blood sugars 5 out of 7 mornings. She is continuing that goal and will begin recording a dinner log. Pt feels the accountability of the virtual program and App has been very helpful in reminding her to exercise and check her blood sugars. She is motivated to continue making goals but struggling not to feel overwhelmed. We discussed setting attainable goals and not setting goals that are too lofty.  Toni [name] was able to start reading labels and using the plate method last week. She said these were helpful. Recommended carb intake at meals 45-60 g and 15-30 g for snacks. Activity: dinner/glucose log Handouts: snacks, carb counting, food log Reinforced education and goals with summary email.  Will continue to monitor pt with weekly phone calls for virtual cardiac rehab.   [name], MS, RDN, LDN  

## 2019-12-01 ENCOUNTER — Telehealth (HOSPITAL_COMMUNITY): Payer: Self-pay | Admitting: *Deleted

## 2019-12-01 ENCOUNTER — Encounter (HOSPITAL_COMMUNITY)
Admission: RE | Admit: 2019-12-01 | Discharge: 2019-12-01 | Disposition: A | Discharge status: Home / Self Care | Payer: No Typology Code available for payment source | Source: Ambulatory Visit | Attending: Cardiology | Admitting: Cardiology

## 2019-12-01 NOTE — Telephone Encounter (Signed)
Toni [name] documented feeling lightheaded after exercising on the treadmill yesterday evening on the virtual APP. Toni [name] documented a heart rate of 81 pre exercise. Blood pressure 123/74. Post exercise blood pressure 95/72. CBG 193. Toni [name] had walked on the treadmill for 30 minutes at a speed of 2.6/1.0. Toni [name] says that she may not have drank enough water and that she ate some pickles yesterday. Toni [name] says she feel fine today and has no symptoms. Toni [name] says that she checked her blood pressure this morning. Toni [name]'s blood pressure today is 115/72. Heart rate 86. Will notify the heart failure clinic about Toni [name]'s symptoms.Barnet Pall, RN,BSN 12/01/2019 10:11 AM

## 2019-12-02 ENCOUNTER — Encounter (HOSPITAL_COMMUNITY): Payer: No Typology Code available for payment source

## 2019-12-04 ENCOUNTER — Encounter (HOSPITAL_COMMUNITY): Payer: No Typology Code available for payment source

## 2019-12-07 ENCOUNTER — Encounter (HOSPITAL_COMMUNITY)
Admission: RE | Admit: 2019-12-07 | Discharge: 2019-12-07 | Disposition: A | Discharge status: Home / Self Care | Payer: Self-pay | Source: Ambulatory Visit | Attending: Cardiology | Admitting: Cardiology

## 2019-12-07 ENCOUNTER — Encounter (HOSPITAL_COMMUNITY): Payer: No Typology Code available for payment source

## 2019-12-07 ENCOUNTER — Other Ambulatory Visit: Payer: Self-pay

## 2019-12-07 NOTE — Progress Notes (Signed)
Nutrition Note: Virtual Visit  Week 3 Spoke with pt for virtual cardiac rehab. Pt is exercising at home about 4-5 days/week for 30 minutes/session. Pt is reporting exercise in virtual app. Pt denies symptoms.   Diet recall reviewed. Pt denies any problems with medications.  Nutrition goals reviewed. Pt met goals from last week. She checked her blood sugars 4 out of 7 mornings and dinner log 4 evenings. Pt able to identify carbohydrate sources at dinner. She typically grazes throughout the evening. Discussed structured meals and snacks to better determine carb intake. Activity: continue dinner/glucose log Handouts: reading a food label Reinforced education and goals with summary email.  Will continue to monitor pt with weekly phone calls for virtual cardiac rehab.   Toni Offer, MS, RDN, LDN

## 2019-12-09 ENCOUNTER — Encounter (HOSPITAL_COMMUNITY): Payer: No Typology Code available for payment source

## 2019-12-09 ENCOUNTER — Encounter (HOSPITAL_COMMUNITY)
Admission: RE | Admit: 2019-12-09 | Discharge: 2019-12-09 | Disposition: A | Discharge status: Home / Self Care | Payer: Self-pay | Source: Ambulatory Visit | Attending: Interventional Cardiology | Admitting: Interventional Cardiology

## 2019-12-09 ENCOUNTER — Other Ambulatory Visit (HOSPITAL_COMMUNITY): Payer: Self-pay | Admitting: Cardiology

## 2019-12-09 ENCOUNTER — Other Ambulatory Visit (HOSPITAL_COMMUNITY): Payer: Self-pay | Admitting: Adult Health

## 2019-12-09 MED FILL — DIGOXIN 0.125 MG TABLET: 125 | 90 days supply | Qty: 90 | Fill #0

## 2019-12-09 MED FILL — metFORMIN HCL 1000 MG TABS: 1000 | 90 days supply | Qty: 180 | Fill #0

## 2019-12-09 MED FILL — ISOSORBIDE DN 20 MG TABLET: 20 | 30 days supply | Qty: 90 | Fill #0

## 2019-12-09 NOTE — Progress Notes (Addendum)
Virtual Visit: Cardiac Rehab  Contacted patient for follow-up on progress with home exercise routine. Patient states she is making good progress with exercise and inquired about increasing incline and speed on treadmill. Patient has increased from 1% incline to 1.5% without problem. I advised patient that if her heart rate is within her target heart rate range and the exercise feels light, she can and should increase her workloads as tolerated. Patient mentioned that she has had a couple of episodes of dizziness after exercise including today. I asked patient if she had checked her blood pressure, and she checked it an hour later and it was 109/68. I encouraged patient to check BP and blood sugar if she's symptomatic with exercise. Pt states she feels fine now. I also encouraged patient to stay hydrated before, during, and after exercise, and patient is agreeable to this.   Sol Passer, MS, ACSM CEP

## 2019-12-11 ENCOUNTER — Encounter (HOSPITAL_COMMUNITY): Payer: Self-pay | Admitting: Emergency Medicine

## 2019-12-11 ENCOUNTER — Encounter (HOSPITAL_COMMUNITY): Payer: No Typology Code available for payment source

## 2019-12-11 ENCOUNTER — Other Ambulatory Visit: Payer: Self-pay

## 2019-12-11 ENCOUNTER — Ambulatory Visit (HOSPITAL_COMMUNITY)
Admission: EM | Admit: 2019-12-11 | Discharge: 2019-12-11 | Disposition: A | Discharge status: Home / Self Care | Payer: No Typology Code available for payment source | Attending: Family Medicine | Admitting: Family Medicine

## 2019-12-11 DIAGNOSIS — S161XXA Strain of muscle, fascia and tendon at neck level, initial encounter: Secondary | ICD-10-CM

## 2019-12-11 DIAGNOSIS — M7918 Myalgia, other site: Secondary | ICD-10-CM

## 2019-12-11 MED ORDER — TRAMADOL HCL 50 MG PO TABS: 50.0000 mg | ORAL_TABLET | Freq: Four times a day (QID) | ORAL | 0 refills | Status: DC | PRN

## 2019-12-11 MED ORDER — TIZANIDINE HCL 4 MG PO TABS: 4.0000 mg | ORAL_TABLET | Freq: Four times a day (QID) | ORAL | 0 refills | Status: DC | PRN
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
## 2019-12-11 NOTE — Discharge Instructions (Addendum)
Home to rest.  Use ice or heat on your painful neck muscles Take Tylenol for mild to moderate pain Take tramadol for severe pain.  Take with food.  Do not drive on tramadol Take  tizanidine as a muscle relaxer.  This is useful at nighttime Call your PCP if not improving by Monday

## 2019-12-14 ENCOUNTER — Inpatient Hospital Stay (HOSPITAL_COMMUNITY)
Admission: RE | Admit: 2019-12-14 | Discharge: 2019-12-14 | Disposition: A | Discharge status: Home / Self Care | Payer: No Typology Code available for payment source | Source: Ambulatory Visit

## 2019-12-14 ENCOUNTER — Encounter (HOSPITAL_COMMUNITY): Payer: No Typology Code available for payment source

## 2019-12-14 ENCOUNTER — Other Ambulatory Visit: Payer: Self-pay

## 2019-12-14 MED FILL — FUROSEMIDE 20 MG TABS: 20 | 30 days supply | Qty: 30 | Fill #2

## 2019-12-14 NOTE — Progress Notes (Signed)
Nutrition Note: Virtual Visit  Spoke with pt for virtual cardiac rehab. Toni [name] interested in getting set up for [place] vaccination. Provided instruction. Discussed CBGs. Toni [name] said her CBG was 144 mg/dl today which is the lowest it has been recently. She was excited that it seems to be coming down. However, her meter is giving her problems and she does not have a car to get to the pharmacy for new meter. Providing pt with a Contour meter and supplies.  Diet recall reviewed. Nutrition goals reviewed. Activity: check CBG and get acclimated to new meter Will continue to monitor pt with weekly phone calls for virtual cardiac rehab.   Michaele Offer, MS, RDN, LDN

## 2019-12-16 ENCOUNTER — Encounter (HOSPITAL_COMMUNITY)
Admission: RE | Admit: 2019-12-16 | Discharge: 2019-12-16 | Disposition: A | Discharge status: Home / Self Care | Payer: No Typology Code available for payment source | Source: Ambulatory Visit | Attending: Interventional Cardiology | Admitting: Interventional Cardiology

## 2019-12-16 ENCOUNTER — Encounter (HOSPITAL_COMMUNITY): Payer: No Typology Code available for payment source

## 2019-12-16 ENCOUNTER — Telehealth: Payer: Self-pay

## 2019-12-16 DIAGNOSIS — I5022 Chronic systolic (congestive) heart failure: Secondary | ICD-10-CM | POA: Insufficient documentation

## 2019-12-16 MED FILL — FARXIGA 10 MG TABLET: 10 | 30 days supply | Qty: 30 | Fill #5
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
## 2019-12-18 ENCOUNTER — Encounter (HOSPITAL_COMMUNITY): Payer: No Typology Code available for payment source

## 2019-12-21 ENCOUNTER — Inpatient Hospital Stay (HOSPITAL_COMMUNITY)
Admission: RE | Admit: 2019-12-21 | Discharge: 2019-12-21 | Disposition: A | Discharge status: Home / Self Care | Payer: No Typology Code available for payment source | Source: Ambulatory Visit

## 2019-12-21 ENCOUNTER — Encounter (HOSPITAL_COMMUNITY): Payer: No Typology Code available for payment source

## 2019-12-21 ENCOUNTER — Other Ambulatory Visit: Payer: Self-pay

## 2019-12-21 DIAGNOSIS — I5022 Chronic systolic (congestive) heart failure: Secondary | ICD-10-CM | POA: Insufficient documentation

## 2019-12-21 NOTE — Progress Notes (Signed)
Nutrition Note: Virtual Visit  Spoke with pt for virtual cardiac rehab. Pt is trying to exercise at home. She is reporting it on the [employer] app.  Diet recall reviewed. Nutrition goals reviewed. She started checking CBGs again. Today her fasting CBG was 159 mg/dl. She said she is getting back on track today and reading labels.  Activity: CBGs daily, stock healthy foods for quick meals and snacks. Handouts: healthy snack ideas Reinforced education and goals with summary email.  Will continue to monitor pt with weekly phone calls for virtual cardiac rehab.   Michaele Offer, MS, RDN, LDN

## 2019-12-22 ENCOUNTER — Other Ambulatory Visit (HOSPITAL_COMMUNITY): Payer: Self-pay | Admitting: *Deleted

## 2019-12-22 ENCOUNTER — Telehealth (HOSPITAL_COMMUNITY): Payer: Self-pay

## 2019-12-22 NOTE — Telephone Encounter (Signed)
Received a call from the [employer] about long term disability. They wanted to know if pt is able to return to work under the restrictions outlined in the paper work. Per Dr. Aundra Dubin pt able to return to work under restrictions.   Called pt to make aware that MD has given ok for pt to return to work at [employer]. Pt able to sit for up to 6 hours, stand/walk intermitted up to 1 hour, pt may not lift greater than 25lbs. Unable to reach pt. Left voicemail.   Called the [employer] to make them aware.

## 2019-12-23 ENCOUNTER — Encounter (HOSPITAL_COMMUNITY): Payer: Self-pay | Admitting: *Deleted

## 2019-12-23 ENCOUNTER — Telehealth (HOSPITAL_COMMUNITY): Payer: Self-pay

## 2019-12-23 ENCOUNTER — Encounter (HOSPITAL_COMMUNITY): Payer: No Typology Code available for payment source

## 2019-12-23 DIAGNOSIS — I5022 Chronic systolic (congestive) heart failure: Secondary | ICD-10-CM

## 2019-12-23 MED FILL — SPIRONOLACTONE 25 MG TABS: 25 | 90 days supply | Qty: 90 | Fill #2

## 2019-12-23 NOTE — Telephone Encounter (Signed)
Pt insurance is active and benefits verified through [place] 0, DED 0/0 met, out of pocket 0/0 met, co-insurance 0. no pre-authorization required, REF# [id number]  Will contact patient to see if she is interested in the Cardiac Rehab Program. If interested, patient will need to complete follow up appt. Once completed, patient will be contacted for scheduling upon review by the RN Navigator.

## 2019-12-23 NOTE — Addendum Note (Signed)
Encounter addended by: Sol Passer on: 12/23/2019 2:19 PM  Actions taken: Flowsheet data copied forward, Flowsheet accepted

## 2019-12-23 NOTE — Progress Notes (Signed)
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 Vocational Rehabilitation  No       Education: Education Goals: Education classes will be provided on a  weekly basis, covering required topics. Participant will state understanding/return demonstration of topics presented.  Learning Barriers/Preferences: Learning Barriers/Preferences - 11/03/19 1022      Learning Barriers/Preferences   Learning Barriers  Exercise Concerns    Learning Preferences  Written Material       Education Topics: Hypertension, Hypertension Reduction -Define heart disease and high blood pressure. Discus how high blood pressure affects the body and ways to reduce high blood pressure.   Exercise and Your Heart -Discuss why it is important to exercise, the FITT principles of exercise, normal and abnormal responses to exercise, and how to exercise  safely.   Angina -Discuss definition of angina, causes of angina, treatment of angina, and how to decrease risk of having angina.   Cardiac Medications -Review what the following cardiac medications are used for, how they affect the body, and side effects that may occur when taking the medications.  Medications include Aspirin, Beta blockers, calcium channel blockers, ACE Inhibitors, angiotensin receptor blockers, diuretics, digoxin, and antihyperlipidemics.   Congestive Heart Failure -Discuss the definition of CHF, how to live with CHF, the signs and symptoms of CHF, and how keep track of weight and sodium intake.   Heart Disease and Intimacy -Discus the effect sexual activity has on the heart, how changes occur during intimacy as we age, and safety during sexual activity.   Smoking Cessation / COPD -Discuss different methods to quit smoking, the health benefits of quitting smoking, and the definition of COPD.   Nutrition I: Fats -Discuss the types of cholesterol, what cholesterol does to the heart, and how cholesterol levels can be controlled.   Nutrition II: Labels -Discuss the different components of food labels and how to read food label   Heart Parts/Heart Disease and PAD -Discuss the anatomy of the heart, the pathway of blood circulation through the heart, and these are affected by heart disease.   Stress I: Signs and Symptoms -Discuss the causes of stress, how stress may lead to anxiety and depression, and ways to limit stress.   Stress II: Relaxation -Discuss different types of relaxation techniques to limit stress.   Warning Signs of Stroke / TIA -Discuss definition of a stroke, what the signs and symptoms are of a stroke, and how to identify when someone is having stroke.   Knowledge Questionnaire Score: Knowledge Questionnaire Score - 11/03/19 1028      Knowledge Questionnaire Score   Pre Score  24/24       Core Components/Risk Factors/Patient Goals  at Admission: Personal Goals and Risk Factors at Admission - 11/03/19 0915      Core Components/Risk Factors/Patient Goals on Admission    Weight Management  Yes;Weight Maintenance    Intervention  Weight Management: Develop a combined nutrition and exercise program designed to reach desired caloric intake, while maintaining appropriate intake of nutrient and fiber, sodium and fats, and appropriate energy expenditure required for the weight goal.;Weight Management: Provide education and appropriate resources to help participant work on and attain dietary goals.    Admit Weight  146 lb 13.2 oz (66.6 kg)    Expected Outcomes  Short Term: Continue to assess and modify interventions until short term weight is achieved;Weight Maintenance: Understanding of the daily nutrition guidelines, which includes 25-35% calories from fat, 7% or less cal from saturated fats, less than 220m cholesterol, less than 1.5gm of sodium, & 5 or more servings of fruits and vegetables daily;Long Term: Adherence to nutrition and physical activity/exercise program aimed toward attainment of established weight  goal;Weight Loss: Understanding of general recommendations for a balanced deficit meal plan, which promotes 1-2 lb weight loss per week and includes a negative energy balance of [phone] kcal/d;Understanding recommendations for meals to include 15-35% energy as protein, 25-35% energy from fat, 35-60% energy from carbohydrates, less than 248m of dietary cholesterol, 20-35 gm of total fiber daily;Understanding of distribution of calorie intake throughout the day with the consumption of 4-5 meals/snacks    Diabetes  Yes    Intervention  Provide education about signs/symptoms and action to take for hypo/hyperglycemia.;Provide education about proper nutrition, including hydration, and aerobic/resistive exercise prescription along with prescribed medications to achieve blood glucose in normal ranges: Fasting glucose 65-99 mg/dL     Expected Outcomes  Short Term: Participant verbalizes understanding of the signs/symptoms and immediate care of hyper/hypoglycemia, proper foot care and importance of medication, aerobic/resistive exercise and nutrition plan for blood glucose control.;Long Term: Attainment of HbA1C < 7%.    Heart Failure  Yes    Intervention  Provide a combined exercise and nutrition program that is supplemented with education, support and counseling about heart failure. Directed toward relieving symptoms such as shortness of breath, decreased exercise tolerance, and extremity edema.    Expected Outcomes  Improve functional capacity of life;Short term: Attendance in program 2-3 days a week with increased exercise capacity. Reported lower sodium intake. Reported increased fruit and vegetable intake. Reports medication compliance.;Short term: Daily weights obtained and reported for increase. Utilizing diuretic protocols set by physician.;Long term: Adoption of self-care skills and reduction of barriers for early signs and symptoms recognition and intervention leading to self-care maintenance.    Hypertension  Yes    Intervention  Provide education on lifestyle modifcations including regular physical activity/exercise, weight management, moderate sodium restriction and increased consumption of fresh fruit, vegetables, and low fat dairy, alcohol moderation, and smoking cessation.;Monitor prescription use compliance.    Expected Outcomes  Short Term: Continued assessment and intervention until BP is < 140/972mHG in hypertensive participants. < 130/8055mG in hypertensive participants with diabetes, heart failure or chronic kidney disease.;Long Term: Maintenance of blood pressure at goal levels.    Lipids  Yes    Intervention  Provide education and support for participant on nutrition & aerobic/resistive exercise along with prescribed medications to achieve LDL <26m8mDL >40mg59m Expected Outcomes  Short Term: Participant  states understanding of desired cholesterol values and is compliant with medications prescribed. Participant is following exercise prescription and nutrition guidelines.;Long Term: Cholesterol controlled with medications as prescribed, with individualized exercise RX and with personalized nutrition plan. Value goals: LDL < 26mg,71m > 40 mg.    Stress  Yes    Intervention  Offer individual and/or small group education and counseling on adjustment to heart disease, stress management and health-related lifestyle change. Teach and support self-help strategies.;Refer participants experiencing significant psychosocial distress to appropriate mental health specialists for further evaluation and treatment. When possible, include family members and significant others in education/counseling sessions.    Expected Outcomes  Short Term: Participant demonstrates changes in health-related behavior, relaxation and other stress management skills, ability to obtain effective social support, and compliance with psychotropic medications if prescribed.;Long Term: Emotional wellbeing is indicated by absence of clinically significant psychosocial distress or social isolation.       Core Components/Risk Factors/Patient Goals Review:  Goals and Risk Factor Review    Row Name 11/12/19 0955 12/23/19 1618           Core Components/Risk Factors/Patient Goals Review  Personal Goals Review  Weight Management/Obesity;Heart Failure;Lipids;Stress;Hypertension;Diabetes  Weight Management/Obesity;Heart Failure;Lipids;Stress;Hypertension;Diabetes      Review  Ranada started exercise on 11/09/19 and has done well with exercise. [place]mere's vital signs and CBG's have been stable  Anjela has been participating in virtual cardiac rehab      Expected [place] will partcipate in phase 2 cardiac rehab until December 30. Jameisha will transition to viirtual phase 2 cardiac rehab due to the [place] 19 pandemic  Cardiac rehab will resume on  01/04/20 for in person exercise         Core Components/Risk Factors/Patient Goals at Discharge (Final Review):  Goals and Risk Factor Review - 12/23/19 1618      Core Components/Risk Factors/Patient Goals Review   Personal Goals Review  Weight Management/Obesity;Heart Failure;Lipids;Stress;Hypertension;Diabetes    Review  Fiana has been participating in virtual cardiac rehab    Expected Outcomes  Cardiac rehab will resume on 01/04/20 for in person exercise       ITP Comments: ITP Comments    Row Name 11/03/19 8588 11/23/19 0925 12/23/19 1617       ITP Comments  Dr. Fransico Him, Medical Director Cardiac Rehab [place]  30 Day ITP Review. Brittnee was off to a good start to exercise. Tytionna has transitoned to virtual cardiac rehab as in person cardiac rehab has been placed on hold due to the [place] 19 pandemic. Nashayla plans to use the virtual CR APP will continue to monitor virtually.  30 Day ITP Review In Person Phase 2 Cardiac rehab is resuming on 01/04/20        Comments: See ITP comments.Barnet Pall, RN,BSN 12/23/2019 4:21 PM

## 2019-12-24 NOTE — Telephone Encounter (Signed)
Scheduled pt for remaining 21 cardiac rehab sessions. Pt will come in on 01/04/2020@8 :45am and graduate on 02/19/2020.

## 2019-12-25 ENCOUNTER — Encounter (HOSPITAL_COMMUNITY): Payer: No Typology Code available for payment source

## 2019-12-28 ENCOUNTER — Encounter (HOSPITAL_COMMUNITY): Payer: No Typology Code available for payment source

## 2019-12-28 MED FILL — SERTRALINE HCL 50 MG TABLET: 50 | 30 days supply | Qty: 30 | Fill #0

## 2019-12-29 MED FILL — CARVEDILOL 12.5 MG TABLET: 12.5 | 30 days supply | Qty: 90 | Fill #1

## 2019-12-29 MED FILL — LOSARTAN POTASSIUM 25 MG TA: 25 | 30 days supply | Qty: 60 | Fill #2

## 2019-12-29 MED FILL — hydrALAZINE HCL 50 MG TABS: 50 | 90 days supply | Qty: 270 | Fill #1

## 2019-12-30 ENCOUNTER — Encounter (HOSPITAL_COMMUNITY): Payer: No Typology Code available for payment source

## 2019-12-30 ENCOUNTER — Other Ambulatory Visit: Payer: Self-pay

## 2019-12-30 ENCOUNTER — Ambulatory Visit (HOSPITAL_COMMUNITY)
Admission: RE | Admit: 2019-12-30 | Discharge: 2019-12-30 | Disposition: A | Discharge status: Home / Self Care | Payer: No Typology Code available for payment source | Source: Ambulatory Visit | Attending: Cardiology | Admitting: Cardiology

## 2019-12-30 ENCOUNTER — Ambulatory Visit (INDEPENDENT_AMBULATORY_CARE_PROVIDER_SITE_OTHER): Payer: No Typology Code available for payment source | Admitting: *Deleted

## 2019-12-30 VITALS — BP 134/78 | HR 74 | Wt 143.0 lb

## 2019-12-30 DIAGNOSIS — I428 Other cardiomyopathies: Secondary | ICD-10-CM

## 2019-12-30 DIAGNOSIS — Z7982 Long term (current) use of aspirin: Secondary | ICD-10-CM | POA: Diagnosis not present

## 2019-12-30 DIAGNOSIS — I5022 Chronic systolic (congestive) heart failure: Secondary | ICD-10-CM | POA: Diagnosis not present

## 2019-12-30 DIAGNOSIS — Z8249 Family history of ischemic heart disease and other diseases of the circulatory system: Secondary | ICD-10-CM | POA: Insufficient documentation

## 2019-12-30 DIAGNOSIS — Z801 Family history of malignant neoplasm of trachea, bronchus and lung: Secondary | ICD-10-CM | POA: Insufficient documentation

## 2019-12-30 DIAGNOSIS — Z79899 Other long term (current) drug therapy: Secondary | ICD-10-CM | POA: Insufficient documentation

## 2019-12-30 DIAGNOSIS — E119 Type 2 diabetes mellitus without complications: Secondary | ICD-10-CM | POA: Diagnosis not present

## 2019-12-30 DIAGNOSIS — I110 Hypertensive heart disease with heart failure: Secondary | ICD-10-CM | POA: Diagnosis not present

## 2019-12-30 DIAGNOSIS — Z833 Family history of diabetes mellitus: Secondary | ICD-10-CM | POA: Insufficient documentation

## 2019-12-30 DIAGNOSIS — Z7984 Long term (current) use of oral hypoglycemic drugs: Secondary | ICD-10-CM | POA: Diagnosis not present

## 2019-12-30 DIAGNOSIS — E785 Hyperlipidemia, unspecified: Secondary | ICD-10-CM | POA: Diagnosis not present

## 2019-12-30 DIAGNOSIS — F329 Major depressive disorder, single episode, unspecified: Secondary | ICD-10-CM | POA: Diagnosis not present

## 2019-12-30 DIAGNOSIS — Z95810 Presence of automatic (implantable) cardiac defibrillator: Secondary | ICD-10-CM | POA: Diagnosis not present

## 2019-12-30 LAB — CUP PACEART REMOTE DEVICE CHECK
Battery Remaining Longevity: 180 mo
Battery Remaining Percentage: 100 %
Brady Statistic RV Percent Paced: 0 %
Date Time Interrogation Session: 20210210090800
HighPow Impedance: 66 Ohm
Implantable Lead Implant Date: 20200730
Implantable Lead Location: 753860
Implantable Lead Model: 292
Implantable Lead Serial Number: 447014
Implantable Pulse Generator Implant Date: 20200730
Lead Channel Impedance Value: 450 Ohm
Lead Channel Pacing Threshold Amplitude: 1.8 V
Lead Channel Pacing Threshold Pulse Width: 0.4 ms
Lead Channel Setting Pacing Amplitude: 3.5 V
Lead Channel Setting Pacing Pulse Width: 0.4 ms
Lead Channel Setting Sensing Sensitivity: 0.5 mV
Pulse Gen Serial Number: 266091

## 2019-12-30 LAB — BASIC METABOLIC PANEL
Anion gap: 12 (ref 5–15)
BUN: 21 mg/dL (ref 8–23)
CO2: 24 mmol/L (ref 22–32)
Calcium: 9.7 mg/dL (ref 8.9–10.3)
Chloride: 102 mmol/L (ref 98–111)
Creatinine, Ser: 0.9 mg/dL (ref 0.44–1.00)
GFR calc Af Amer: >60 mL/min (ref >60)
GFR calc non Af Amer: >60 mL/min (ref >60)
Glucose, Bld: 172 mg/dL — ABNORMAL HIGH (ref 70–99)
Potassium: 4.2 mmol/L (ref 3.5–5.1)
Sodium: 138 mmol/L (ref 135–145)

## 2019-12-30 LAB — DIGOXIN LEVEL: Digoxin Level: 0.4 ng/mL — ABNORMAL LOW (ref 0.8–2.0)

## 2019-12-30 MED ORDER — CARVEDILOL 25 MG PO TABS: 25.0000 mg | ORAL_TABLET | Freq: Two times a day (BID) | ORAL | 5 refills | Status: DC
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
## 2019-12-30 NOTE — Patient Instructions (Signed)
INCREASE Coreg 25mg  (1 tab) twice a day   Labs today We will only contact you if something comes back abnormal or we need to make some changes. Otherwise no news is good news!   Your physician recommends that you schedule a follow-up appointment in: 3 weeks with the pharmacist and 3 months with Dr Aundra Dubin Please call office at [phone] option 2 if you have any questions or concerns.    At the [facility], you and your health needs are our priority. As part of our continuing mission to provide you with exceptional heart care, we have created designated Provider Care Teams. These Care Teams include your primary Cardiologist (physician) and Advanced Practice Providers (APPs- Physician Assistants and Nurse Practitioners) who all work together to provide you with the care you need, when you need it.   You may see any of the following providers on your designated Care Team at your next follow up: Marland Kitchen Dr Glori Bickers . Dr Loralie Champagne . Darrick Grinder, NP . Lyda Jester, PA . Audry Riles, PharmD   Please be sure to bring in all your medications bottles to every appointment.

## 2019-12-31 ENCOUNTER — Encounter (HOSPITAL_COMMUNITY): Payer: No Typology Code available for payment source | Admitting: Cardiology

## 2019-12-31 NOTE — Progress Notes (Signed)
ICD Remote  

## 2020-01-01 ENCOUNTER — Encounter (HOSPITAL_COMMUNITY): Payer: No Typology Code available for payment source

## 2020-01-04 ENCOUNTER — Encounter (HOSPITAL_COMMUNITY): Payer: No Typology Code available for payment source

## 2020-01-05 ENCOUNTER — Other Ambulatory Visit (HOSPITAL_COMMUNITY): Payer: Self-pay | Admitting: Cardiology

## 2020-01-05 ENCOUNTER — Telehealth (HOSPITAL_COMMUNITY): Payer: Self-pay

## 2020-01-05 ENCOUNTER — Encounter (HOSPITAL_COMMUNITY)
Admission: RE | Admit: 2020-01-05 | Discharge: 2020-01-05 | Disposition: A | Discharge status: Home / Self Care | Payer: No Typology Code available for payment source | Source: Ambulatory Visit | Attending: Interventional Cardiology | Admitting: Interventional Cardiology

## 2020-01-05 MED FILL — ISOSORBIDE DN 20 MG TABLET: 20 | 30 days supply | Qty: 90 | Fill #0

## 2020-01-05 MED FILL — MELOXICAM 7.5 MG TABLET: 7.5 | 15 days supply | Qty: 15 | Fill #0
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
## 2020-01-06 ENCOUNTER — Encounter (HOSPITAL_COMMUNITY)
Admission: RE | Admit: 2020-01-06 | Discharge: 2020-01-06 | Disposition: A | Discharge status: Home / Self Care | Payer: No Typology Code available for payment source | Source: Ambulatory Visit | Attending: Cardiology | Admitting: Cardiology

## 2020-01-06 ENCOUNTER — Other Ambulatory Visit: Payer: Self-pay

## 2020-01-06 DIAGNOSIS — I5022 Chronic systolic (congestive) heart failure: Secondary | ICD-10-CM | POA: Diagnosis present

## 2020-01-06 LAB — GLUCOSE, CAPILLARY: Glucose-Capillary: 160 mg/dL — ABNORMAL HIGH (ref 70–99)

## 2020-01-06 NOTE — Progress Notes (Signed)
Daily Session Note  Patient Details  Name: Toni [name] MRN: [id number] Date of Birth: 27-Sep-1956 Referring Provider:     CARDIAC REHAB PHASE II ORIENTATION from 11/03/2019 in [facility]  Referring Provider  Dr. Aundra Dubin      Encounter Date: 01/06/2020  Check In: Session Check In - 01/06/20 0855      Check-In   Supervising physician immediately available to respond to emergencies  [facility] immediately available    Physician(s)  Dr. Cyndia Skeeters    Location  [facility]    Staff Present  Jiles Garter, RN, Bjorn Loser, MS, Exercise Physiologist;Portia Rollene Rotunda, RN, BSN;Brittany Durene Fruits, BS, ACSM CEP, Exercise Physiologist    Virtual Visit  No    Medication changes reported      No    Fall or balance concerns reported     No    Tobacco Cessation  No Change    Warm-up and Cool-down  Performed on first and last piece of equipment    Resistance Training Performed  No    VAD Patient?  No    PAD/SET Patient?  No      Pain Assessment   Currently in Pain?  No/denies    Multiple Pain Sites  No       Capillary Blood Glucose: Results for orders placed or performed during the hospital encounter of 01/06/20 (from the past 24 hour(s))  Glucose, capillary     Status: Abnormal   Collection Time: 01/06/20  8:48 AM  Result Value Ref Range   Glucose-Capillary 160 (H) 70 - 99 mg/dL    Exercise Prescription Changes - 01/06/20 1000      Response to Exercise   Blood Pressure (Admit)  98/64    Blood Pressure (Exercise)  122/66    Blood Pressure (Exit)  100/62    Heart Rate (Admit)  99 bpm    Heart Rate (Exercise)  113 bpm    Heart Rate (Exit)  97 bpm    Rating of Perceived Exertion (Exercise)  13    Symptoms  None    Comments  First day returning to exercise.    Duration  Continue with 30 min of aerobic exercise without signs/symptoms of physical distress.    Intensity  THRR unchanged      Progression   Progression  Continue to  progress workloads to maintain intensity without signs/symptoms of physical distress.    Average METs  2.9      Resistance Training   Training Prescription  No      Interval Training   Interval Training  No      Treadmill   MPH  2.7    Grade  1    Minutes  15    METs  3.63      NuStep   Level  3    SPM  85    Minutes  15    METs  2.2      Home Exercise Plan   Plans to continue exercise at  Home (comment)   treadmill   Frequency  Add 4 additional days to program exercise sessions.       Social History   Tobacco Use  Smoking Status Never Smoker  Smokeless Tobacco Never Used    Goals Met:  Exercise tolerated well  Goals Unmet:  Not Applicable  Comments: Pt restarted cardiac rehab today.  Pt tolerated light exercise without difficulty. VSS, telemetry-ST, asymptomatic.     Dr. Tressia Miners  Turner is Market researcher for Cardiac Rehab at [facility].

## 2020-01-08 ENCOUNTER — Other Ambulatory Visit: Payer: Self-pay

## 2020-01-08 ENCOUNTER — Encounter (HOSPITAL_COMMUNITY)
Admission: RE | Admit: 2020-01-08 | Discharge: 2020-01-08 | Disposition: A | Discharge status: Home / Self Care | Payer: No Typology Code available for payment source | Source: Ambulatory Visit | Attending: Cardiology | Admitting: Cardiology

## 2020-01-08 DIAGNOSIS — I5022 Chronic systolic (congestive) heart failure: Secondary | ICD-10-CM | POA: Diagnosis not present

## 2020-01-11 ENCOUNTER — Other Ambulatory Visit: Payer: Self-pay

## 2020-01-11 ENCOUNTER — Encounter (HOSPITAL_COMMUNITY)
Admission: RE | Admit: 2020-01-11 | Discharge: 2020-01-11 | Disposition: A | Discharge status: Home / Self Care | Payer: No Typology Code available for payment source | Source: Ambulatory Visit | Attending: Cardiology | Admitting: Cardiology

## 2020-01-11 DIAGNOSIS — I5022 Chronic systolic (congestive) heart failure: Secondary | ICD-10-CM | POA: Diagnosis not present

## 2020-01-13 ENCOUNTER — Encounter (HOSPITAL_COMMUNITY)
Admission: RE | Admit: 2020-01-13 | Discharge: 2020-01-13 | Disposition: A | Discharge status: Home / Self Care | Payer: No Typology Code available for payment source | Source: Ambulatory Visit | Attending: Cardiology | Admitting: Cardiology

## 2020-01-13 ENCOUNTER — Other Ambulatory Visit: Payer: Self-pay

## 2020-01-13 DIAGNOSIS — I5022 Chronic systolic (congestive) heart failure: Secondary | ICD-10-CM | POA: Diagnosis not present

## 2020-01-13 MED FILL — FUROSEMIDE 20 MG TABS: 20 | 30 days supply | Qty: 30 | Fill #3

## 2020-01-14 ENCOUNTER — Encounter: Payer: Self-pay | Admitting: Physical Therapy

## 2020-01-14 ENCOUNTER — Ambulatory Visit: Payer: No Typology Code available for payment source | Attending: Family Medicine | Admitting: Physical Therapy

## 2020-01-14 DIAGNOSIS — M62838 Other muscle spasm: Secondary | ICD-10-CM | POA: Diagnosis present

## 2020-01-14 DIAGNOSIS — M542 Cervicalgia: Secondary | ICD-10-CM | POA: Insufficient documentation
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
## 2020-01-15 ENCOUNTER — Other Ambulatory Visit: Payer: Self-pay

## 2020-01-15 ENCOUNTER — Encounter (HOSPITAL_COMMUNITY)
Admission: RE | Admit: 2020-01-15 | Discharge: 2020-01-15 | Disposition: A | Discharge status: Home / Self Care | Payer: No Typology Code available for payment source | Source: Ambulatory Visit | Attending: Cardiology | Admitting: Cardiology

## 2020-01-15 DIAGNOSIS — I5022 Chronic systolic (congestive) heart failure: Secondary | ICD-10-CM

## 2020-01-18 ENCOUNTER — Ambulatory Visit (HOSPITAL_COMMUNITY)
Admission: RE | Admit: 2020-01-18 | Discharge: 2020-01-18 | Disposition: A | Discharge status: Home / Self Care | Payer: No Typology Code available for payment source | Source: Ambulatory Visit | Attending: Family Medicine | Admitting: Family Medicine

## 2020-01-18 ENCOUNTER — Encounter (HOSPITAL_COMMUNITY): Payer: Self-pay

## 2020-01-18 ENCOUNTER — Other Ambulatory Visit: Payer: Self-pay

## 2020-01-18 ENCOUNTER — Encounter (HOSPITAL_COMMUNITY)
Admission: RE | Admit: 2020-01-18 | Discharge: 2020-01-18 | Disposition: A | Discharge status: Home / Self Care | Payer: No Typology Code available for payment source | Source: Ambulatory Visit | Attending: Cardiology | Admitting: Cardiology

## 2020-01-18 VITALS — BP 118/78 | HR 87 | Wt 142.6 lb

## 2020-01-18 DIAGNOSIS — M62838 Other muscle spasm: Secondary | ICD-10-CM | POA: Diagnosis present

## 2020-01-18 DIAGNOSIS — M6281 Muscle weakness (generalized): Secondary | ICD-10-CM | POA: Diagnosis present

## 2020-01-18 DIAGNOSIS — M25512 Pain in left shoulder: Secondary | ICD-10-CM | POA: Diagnosis present

## 2020-01-18 DIAGNOSIS — I5022 Chronic systolic (congestive) heart failure: Secondary | ICD-10-CM

## 2020-01-18 DIAGNOSIS — M25612 Stiffness of left shoulder, not elsewhere classified: Secondary | ICD-10-CM | POA: Diagnosis present

## 2020-01-18 DIAGNOSIS — R293 Abnormal posture: Secondary | ICD-10-CM | POA: Diagnosis present

## 2020-01-18 DIAGNOSIS — I428 Other cardiomyopathies: Secondary | ICD-10-CM

## 2020-01-18 DIAGNOSIS — M542 Cervicalgia: Secondary | ICD-10-CM | POA: Diagnosis not present

## 2020-01-18 DIAGNOSIS — G8929 Other chronic pain: Secondary | ICD-10-CM | POA: Diagnosis present

## 2020-01-18 MED FILL — FARXIGA 10 MG TABLET: 10 | 90 days supply | Qty: 90 | Fill #0
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
## 2020-01-18 NOTE — Patient Instructions (Signed)
It was a pleasure seeing you today!  MEDICATIONS: -No medication changes today -Call if you have questions about your medications.  NEXT APPOINTMENT: Return to clinic in 1 month with PharmD.  In general, to take care of your heart failure: -Limit your fluid intake to 2 Liters (half-gallon) per day.   -Limit your salt intake to ideally 2-3 grams (2000-3000 mg) per day. -Weigh yourself daily and record, and bring that "weight diary" to your next appointment.  (Weight gain of 2-3 pounds in 1 day typically means fluid weight.) -The medications for your heart are to help your heart and help you live longer.   -Please contact us before stopping any of your heart medications.  Call the clinic at [phone] with questions or to reschedule future appointments.

## 2020-01-19 ENCOUNTER — Encounter: Payer: No Typology Code available for payment source | Admitting: Physical Therapy

## 2020-01-20 ENCOUNTER — Encounter (HOSPITAL_COMMUNITY)
Admission: RE | Admit: 2020-01-20 | Discharge: 2020-01-20 | Disposition: A | Discharge status: Home / Self Care | Payer: No Typology Code available for payment source | Source: Ambulatory Visit | Attending: Cardiology | Admitting: Cardiology

## 2020-01-20 ENCOUNTER — Other Ambulatory Visit: Payer: Self-pay

## 2020-01-20 DIAGNOSIS — I5022 Chronic systolic (congestive) heart failure: Secondary | ICD-10-CM

## 2020-01-20 DIAGNOSIS — M542 Cervicalgia: Secondary | ICD-10-CM | POA: Diagnosis not present

## 2020-01-21 ENCOUNTER — Ambulatory Visit: Payer: No Typology Code available for payment source | Attending: Family Medicine | Admitting: Physical Therapy

## 2020-01-21 ENCOUNTER — Encounter: Payer: Self-pay | Admitting: Physical Therapy

## 2020-01-21 DIAGNOSIS — M25512 Pain in left shoulder: Secondary | ICD-10-CM | POA: Insufficient documentation

## 2020-01-21 DIAGNOSIS — M542 Cervicalgia: Secondary | ICD-10-CM | POA: Insufficient documentation

## 2020-01-21 DIAGNOSIS — M6281 Muscle weakness (generalized): Secondary | ICD-10-CM | POA: Insufficient documentation

## 2020-01-21 DIAGNOSIS — G8929 Other chronic pain: Secondary | ICD-10-CM | POA: Insufficient documentation

## 2020-01-21 DIAGNOSIS — M25612 Stiffness of left shoulder, not elsewhere classified: Secondary | ICD-10-CM | POA: Insufficient documentation

## 2020-01-21 DIAGNOSIS — R293 Abnormal posture: Secondary | ICD-10-CM | POA: Insufficient documentation

## 2020-01-21 DIAGNOSIS — M62838 Other muscle spasm: Secondary | ICD-10-CM | POA: Insufficient documentation
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
## 2020-01-22 ENCOUNTER — Other Ambulatory Visit: Payer: Self-pay

## 2020-01-22 ENCOUNTER — Encounter (HOSPITAL_COMMUNITY)
Admission: RE | Admit: 2020-01-22 | Discharge: 2020-01-22 | Disposition: A | Discharge status: Home / Self Care | Payer: No Typology Code available for payment source | Source: Ambulatory Visit | Attending: Cardiology | Admitting: Cardiology

## 2020-01-22 ENCOUNTER — Encounter: Payer: No Typology Code available for payment source | Admitting: Physical Therapy

## 2020-01-22 DIAGNOSIS — M542 Cervicalgia: Secondary | ICD-10-CM | POA: Diagnosis not present

## 2020-01-22 DIAGNOSIS — I5022 Chronic systolic (congestive) heart failure: Secondary | ICD-10-CM

## 2020-01-22 MED FILL — VICTOZA 18 MG/3 ML INJECT P: 18 | 30 days supply | Qty: 9 | Fill #0

## 2020-01-25 ENCOUNTER — Other Ambulatory Visit: Payer: Self-pay

## 2020-01-25 ENCOUNTER — Encounter (HOSPITAL_COMMUNITY)
Admission: RE | Admit: 2020-01-25 | Discharge: 2020-01-25 | Disposition: A | Discharge status: Home / Self Care | Payer: No Typology Code available for payment source | Source: Ambulatory Visit | Attending: Cardiology | Admitting: Cardiology

## 2020-01-25 DIAGNOSIS — I5022 Chronic systolic (congestive) heart failure: Secondary | ICD-10-CM

## 2020-01-25 DIAGNOSIS — M542 Cervicalgia: Secondary | ICD-10-CM | POA: Diagnosis not present

## 2020-01-25 MED FILL — SERTRALINE HCL 50 MG TABLET: 50 | 90 days supply | Qty: 90 | Fill #0

## 2020-01-25 MED FILL — CARVEDILOL 25 MG TABLET: 25 | 90 days supply | Qty: 180 | Fill #0

## 2020-01-25 MED FILL — LINZESS 145 MCG CAPSULE: 145 | 60 days supply | Qty: 60 | Fill #1

## 2020-01-25 NOTE — Progress Notes (Signed)
Toni [name] 64 y.o. female Nutrition Note Spoke with pt during exercise. She is feeling more confident in her food choices and CBG monitoring. She feels her mental health is improving as well. She reports her greatest improvement has been eating a formal meal at night instead of grazing/snacking all evening. She is able to read labels and plan her meals. She is purchasing healthy snack options and making heart healthy dessert choices occasionally.  Fasting CBG this morning 136 mg/dl.  Much improved from 150-200 mg/dl 3 months ago.  Diet recall:  Breakfast: oats Lunch: Cheese and triscuits Dinner: chicken, asparagus, pasta (1/2 cup serving) Snacks: fruit, cottage cheese, sugar free pudding  Plan:    Will provide client-centered nutrition education as part of interdisciplinary care  Monitor and evaluate progress toward nutrition goal with team.   Michaele Offer, MS, RDN, LDN

## 2020-01-26 ENCOUNTER — Encounter: Payer: No Typology Code available for payment source | Admitting: Physical Therapy

## 2020-01-27 ENCOUNTER — Other Ambulatory Visit: Payer: Self-pay

## 2020-01-27 ENCOUNTER — Encounter (HOSPITAL_COMMUNITY)
Admission: RE | Admit: 2020-01-27 | Discharge: 2020-01-27 | Disposition: A | Discharge status: Home / Self Care | Payer: No Typology Code available for payment source | Source: Ambulatory Visit | Attending: Cardiology | Admitting: Cardiology

## 2020-01-27 ENCOUNTER — Ambulatory Visit: Payer: No Typology Code available for payment source | Admitting: Physical Therapy

## 2020-01-27 ENCOUNTER — Encounter: Payer: Self-pay | Admitting: Physical Therapy

## 2020-01-27 DIAGNOSIS — M542 Cervicalgia: Secondary | ICD-10-CM | POA: Diagnosis not present

## 2020-01-27 DIAGNOSIS — I5022 Chronic systolic (congestive) heart failure: Secondary | ICD-10-CM

## 2020-01-27 DIAGNOSIS — M62838 Other muscle spasm: Secondary | ICD-10-CM

## 2020-01-27 NOTE — Therapy (Signed)
[place], [place], [address] Phone: [phone]   Fax:  [phone]  Physical Therapy Treatment  Patient Details  Name: Toni [name] MRN: [id number] Date of Birth: June 17, 1956 Referring Provider (PT): Dr Donald Prose MD    Encounter Date: 01/27/2020  PT End of Session - 01/27/20 1505    Visit Number  3    Number of Visits  12    Date for PT Re-Evaluation  02/25/20    PT Start Time  1501    PT Stop Time  1541    PT Time Calculation (min)  40 min    Activity Tolerance  Patient tolerated treatment well    Behavior During Therapy  [facility] for tasks assessed/performed       Past Medical History:  Diagnosis Date  . CHF (congestive heart failure) ([place])   . Diabetes mellitus without complication ([employer])   . Hypertension     Past Surgical History:  Procedure Laterality Date  . ABDOMINAL AORTIC ANEURYSM REPAIR    . ABDOMINAL HYSTERECTOMY    . CARDIAC CATHETERIZATION     in [facility], clean, per pt.  . ICD IMPLANT N/A 06/18/2019   Procedure: ICD IMPLANT;  Surgeon: Evans Lance, MD;  Location: [facility];  Service: Cardiovascular;  Laterality: N/A;  . RIGHT/LEFT HEART CATH AND CORONARY ANGIOGRAPHY N/A 11/17/2018   Procedure: RIGHT/LEFT HEART CATH AND CORONARY ANGIOGRAPHY;  Surgeon: Belva Crome, MD;  Location: [place] [facility];  Service: Cardiovascular;  Laterality: N/A;    There were no vitals filed for this visit.  Subjective Assessment - 01/27/20 1505    Subjective  " The DN really helped on the R side and my L shoulder is alittle more tight"    Patient Stated Goals  Patient wants her headaches to stop. She wants to get off her medicine    Currently in Pain?  Yes    Pain Score  5     Pain Location  Neck    Pain Orientation  Left    Pain Descriptors / Indicators  Aching    Pain Onset  More than a month ago    Pain Frequency  Intermittent    Aggravating Factors   looking to the L    Pain  Relieving Factors  DN resting,         [facility] PT Assessment - 01/27/20 0001      Assessment   Medical Diagnosis  Cervicalgia     Referring Provider (PT)  Dr Donald Prose MD                    [facility] Adult PT Treatment/Exercise - 01/27/20 1520      Neck Exercises: Machines for Strengthening   UBE (Upper Arm Bike)  L3 x 5 min    fwd/bwd 2:30 md     Neck Exercises: Theraband   Shoulder Extension  15 reps;Green   bil   Rows  15 reps;Green   bil   Other Theraband Exercises  scapular retraction with bil ER 2 x 12      Manual Therapy   Manual Therapy  Joint mobilization    Manual therapy comments  skilled palpation and monitoring of pt throughou tTPDN    Joint Mobilization  C3-C7 and T1-T8 PA grade III, bil first rib inferior mobs grade III    Soft tissue mobilization  IASTM to bil upper trap/ levator scapuale  Neck Exercises: Stretches   Upper Trapezius Stretch  20 seconds;1 rep;Right;Left       Trigger Point Dry Needling - 01/27/20 0001    Consent Given?  Yes    Education Handout Provided  Previously provided    Muscles Treated Head and Neck  Upper trapezius;Cervical multifidi;Levator scapulae    Upper Trapezius Response  Twitch reponse elicited;Palpable increased muscle length   bil   Levator Scapulae Response  Twitch response elicited;Palpable increased muscle length   bil            PT Short Term Goals - 01/14/20 1328      PT SHORT TERM GOAL #1   Title  Patient will increase cervical flexion and extension by 10 degrees bilateral    Time  3    Period  Weeks    Status  New    Target Date  02/04/20      PT SHORT TERM GOAL #2   Title  Patient will increase right shoulder strength to 5/5    Time  3    Period  Weeks    Status  New    Target Date  02/04/20      PT SHORT TERM GOAL #3   Title  Patient will increase bilateral cervical rotation by 20%    Time  3    Period  Weeks    Status  New    Target Date  02/04/20        PT Long Term  Goals - 01/14/20 1331      PT LONG TERM GOAL #1   Title  Patient will dmeonstrate functional right internal rotation to t-12 in order to perfrom ADL's    Time  6    Period  Weeks    Status  New    Target Date  02/25/20      PT LONG TERM GOAL #2   Title  Patient will turn head 60 degrees bilateral without pain in order to improve safety driving    Time  6    Period  Weeks    Status  New      PT LONG TERM GOAL #3   Title  Patient will read for 1 hour without increased pain in her neck    Time  6    Period  Weeks    Status  New    Target Date  02/25/20            Plan - 01/27/20 1539    Clinical Impression Statement  pt continues to progress nicely with PT. continued TPDN focusing on bil upper trap/ levator scapuale which she reponded well to followed with cervicothoracic and first rib mobs. She was able to perform exercises noting decreased tension/ soreness from DN. provided handout on manual trigger point release tool.    PT Treatment/Interventions  ADLs/Self Care Home Management;Cryotherapy;Moist Heat;Traction;Functional mobility training;Therapeutic activities;Therapeutic exercise;Neuromuscular re-education;Patient/family education;Manual lymph drainage;Passive range of motion;Taping;Dry needling    PT Next Visit Plan  Has pacemaker; softtissue mobilization to cervical spine; consider needling if patient is comfortable with it; IASTYM to upper traps; gentle traction; give stretching if range has improved; give light posterior chain stretching    PT Home Exercise Plan  scap retraction; gentle roation in pain free ranges    Consulted and Agree with Plan of Care  Patient       Patient will benefit from skilled therapeutic intervention in order to improve the following deficits and impairments:  Improper body mechanics,  Dizziness, Decreased range of motion, Decreased activity tolerance, Decreased safety awareness, Increased muscle spasms, Postural dysfunction, Pain  Visit  Diagnosis: Cervicalgia  Other muscle spasm     Problem List Patient Active Problem List   Diagnosis Date Noted  . 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 Nonischemic cardiomyopathy ([place]) 06/04/2019  . Hypomagnesemia 11/18/2018  . Hypokalemia 11/15/2018  . Diabetes mellitus without complication ([place])   . Coronary artery disease   . Acute on chronic systolic CHF (congestive heart failure) ([place]) 01/04/2016  . Hyperlipidemia 01/03/2016  . Cardiomegaly - hypertensive 01/03/2016  . DM (diabetes mellitus), type 2 with complications ([place]) [id number]  . Hypertension 01/03/2014   Starr Lake PT, DPT, LAT, ATC  01/27/20  3:42 PM      [facility] [address] [place], [place], [address] Phone: [phone]   Fax:  [phone]  Name: LAKETIA BERKOWITZ MRN: [id number] Date of Birth: September 03, 1956

## 2020-01-28 NOTE — Progress Notes (Signed)
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   Suicidal thoughts 0 -   PHQ-9 Score 11 -   Difficult doing work/chores Somewhat difficult -     Interpretation of Total Score  Total Score Depression Severity:  1-4 = Minimal depression, 5-9 = Mild depression, 10-14 = Moderate depression, 15-19 = Moderately severe depression, 20-27 = Severe depression   Psychosocial Evaluation and Intervention:   Psychosocial Re-Evaluation: Psychosocial Re-Evaluation    [place] Name 11/12/19 0936 01/25/20 1027           Psychosocial Re-Evaluation   Current issues with  Current Depression;Current Anxiety/Panic;Current Stress Concerns;Current Sleep Concerns  Current Depression;Current Anxiety/Panic;Current Stress Concerns;Current Sleep Concerns      Comments  Emmilia continues to have stress concerns regarding her health including her CHF diagnosis and diabetes. Robinn does not think she can return to work and  is worried about her job status  Darla reports a reduction in her stress levels.  She was recently started on Zoloft and feels that it is working for her.      Expected Outcomes  Patient will have better stress managment techniques upon graduation from phase 2 cardiac rehab  Lanisa will continue to report reduction in her stress levels and continued utilization of her resources.      Interventions  Encouraged to attend Cardiac Rehabilitation for the exercise;Stress management education  Encouraged to attend Cardiac Rehabilitation for the exercise;Stress management education      Continue Psychosocial Services   Follow up required by staff  Follow up required by staff      Comments  Cheryll continues to struggle with anxiety, depression, and isolation. She states she has a very strong support system however she finds it difficult to "let people in". She does not want to burden others with her difficulties. She is currently applying for long term disability and the process feels overwhelming to her. She has expressed these concerns to her provider and is scheduled for an initial counseling session Jan 11 through social services. She is offered counseling today with spiritual care department but declines.  --        Initial Review   Source of Stress Concerns  Chronic Illness;Unable to participate in former interests or hobbies;Unable to perform yard/household activities;Occupation;Retirement/disability  --         Psychosocial Discharge (Final Psychosocial Re-Evaluation): Psychosocial Re-Evaluation - 01/25/20 1027      Psychosocial Re-Evaluation   Current issues with  Current Depression;Current Anxiety/Panic;Current Stress Concerns;Current Sleep Concerns    Comments  Tawny reports a reduction in her stress levels.  She was recently started on Zoloft and feels that it is working for her.    Expected Outcomes  Monea will continue to report reduction in her stress levels and continued utilization of her  resources.    Interventions  Encouraged to attend Cardiac Rehabilitation for the exercise;Stress management education    Continue Psychosocial Services   Follow up required by staff       Vocational Rehabilitation: Provide vocational rehab assistance to qualifying candidates.   Vocational Rehab Evaluation & Intervention: Vocational Rehab - 11/03/19 1022      Initial Vocational Rehab  Evaluation & Intervention   Assessment shows need for Vocational Rehabilitation  No       Education: Education Goals: Education classes will be provided on a weekly basis, covering required topics. Participant will state understanding/return demonstration of topics presented.  Learning Barriers/Preferences: Learning Barriers/Preferences - 11/03/19 1022      Learning Barriers/Preferences   Learning Barriers  Exercise Concerns    Learning Preferences  Written Material       Education Topics: Count Your Pulse:  -Group instruction provided by verbal instruction, demonstration, patient participation and written materials to support subject.  Instructors address importance of being able to find your pulse and how to count your pulse when at home without a heart monitor.  Patients get hands on experience counting their pulse with staff help and individually.   Heart Attack, Angina, and Risk Factor Modification:  -Group instruction provided by verbal instruction, video, and written materials to support subject.  Instructors address signs and symptoms of angina and heart attacks.    Also discuss risk factors for heart disease and how to make changes to improve heart health risk factors.   Functional Fitness:  -Group instruction provided by verbal instruction, demonstration, patient participation, and written materials to support subject.  Instructors address safety measures for doing things around the house.  Discuss how to get up and down off the floor, how to pick things up properly, how to safely get out  of a chair without assistance, and balance training.   Meditation and Mindfulness:  -Group instruction provided by verbal instruction, patient participation, and written materials to support subject.  Instructor addresses importance of mindfulness and meditation practice to help reduce stress and improve awareness.  Instructor also leads participants through a meditation exercise.    CARDIAC REHAB PHASE II EXERCISE from 01/30/2019 in [place]  Date  01/21/19  Instruction Review Code  2- Demonstrated Understanding      Stretching for Flexibility and Mobility:  -Group instruction provided by verbal instruction, patient participation, and written materials to support subject.  Instructors lead participants through series of stretches that are designed to increase flexibility thus improving mobility.  These stretches are additional exercise for major muscle groups that are typically performed during regular warm up and cool down.   Hands Only CPR:  -Group verbal, video, and participation provides a basic overview of AHA guidelines for community CPR. Role-play of emergencies allow participants the opportunity to practice calling for help and chest compression technique with discussion of AED use.   Hypertension: -Group verbal and written instruction that provides a basic overview of hypertension including the most recent diagnostic guidelines, risk factor reduction with self-care instructions and medication management.   CARDIAC REHAB PHASE II EXERCISE from 01/30/2019 in [place]  Date  01/30/19  Instruction Review Code  2- Demonstrated Understanding       Nutrition I class: Heart Healthy Eating:  -Group instruction provided by PowerPoint slides, verbal discussion, and written materials to support subject matter. The instructor gives an explanation and review of the Therapeutic Lifestyle Changes diet recommendations, which  includes a discussion on lipid goals, dietary fat, sodium, fiber, plant stanol/sterol esters, sugar, and the components of a well-balanced, healthy diet.   Nutrition II class: Lifestyle Skills:  -Group instruction provided by PowerPoint slides, verbal discussion, and written materials to support subject matter. The instructor gives an explanation and review of label reading, grocery shopping for heart health, heart healthy recipe modifications, and  ways to make healthier choices when eating out.   Diabetes Question & Answer:  -Group instruction provided by PowerPoint slides, verbal discussion, and written materials to support subject matter. The instructor gives an explanation and review of diabetes co-morbidities, pre- and post-prandial blood glucose goals, pre-exercise blood glucose goals, signs, symptoms, and treatment of hypoglycemia and hyperglycemia, and foot care basics.   Diabetes Blitz:  -Group instruction provided by PowerPoint slides, verbal discussion, and written materials to support subject matter. The instructor gives an explanation and review of the physiology behind type 1 and type 2 diabetes, diabetes medications and rational behind using different medications, pre- and post-prandial blood glucose recommendations and Hemoglobin A1c goals, diabetes diet, and exercise including blood glucose guidelines for exercising safely.    Portion Distortion:  -Group instruction provided by PowerPoint slides, verbal discussion, written materials, and food models to support subject matter. The instructor gives an explanation of serving size versus portion size, changes in portions sizes over the last 20 years, and what consists of a serving from each food group.   Stress Management:  -Group instruction provided by verbal instruction, video, and written materials to support subject matter.  Instructors review role of stress in heart disease and how to cope with stress positively.      Exercising on Your Own:  -Group instruction provided by verbal instruction, power point, and written materials to support subject.  Instructors discuss benefits of exercise, components of exercise, frequency and intensity of exercise, and end points for exercise.  Also discuss use of nitroglycerin and activating EMS.  Review options of places to exercise outside of rehab.  Review guidelines for sex with heart disease.   Cardiac Drugs I:  -Group instruction provided by verbal instruction and written materials to support subject.  Instructor reviews cardiac drug classes: antiplatelets, anticoagulants, beta blockers, and statins.  Instructor discusses reasons, side effects, and lifestyle considerations for each drug class.   Cardiac Drugs II:  -Group instruction provided by verbal instruction and written materials to support subject.  Instructor reviews cardiac drug classes: angiotensin converting enzyme inhibitors (ACE-I), angiotensin II receptor blockers (ARBs), nitrates, and calcium channel blockers.  Instructor discusses reasons, side effects, and lifestyle considerations for each drug class.   Anatomy and Physiology of the Circulatory System:  Group verbal and written instruction and models provide basic cardiac anatomy and physiology, with the coronary electrical and arterial systems. Review of: AMI, Angina, Valve disease, Heart Failure, Peripheral Artery Disease, Cardiac Arrhythmia, Pacemakers, and the ICD.   Other Education:  -Group or individual verbal, written, or video instructions that support the educational goals of the cardiac rehab program.   Holiday Eating Survival Tips:  -Group instruction provided by PowerPoint slides, verbal discussion, and written materials to support subject matter. The instructor gives patients tips, tricks, and techniques to help them not only survive but enjoy the holidays despite the onslaught of food that accompanies the holidays.   Knowledge  Questionnaire Score: Knowledge Questionnaire Score - 11/03/19 1028      Knowledge Questionnaire Score   Pre Score  24/24       Core Components/Risk Factors/Patient Goals at Admission: Personal Goals and Risk Factors at Admission - 11/03/19 0915      Core Components/Risk Factors/Patient Goals on Admission    Weight Management  Yes;Weight Maintenance    Intervention  Weight Management: Develop a combined nutrition and exercise program designed to reach desired caloric intake, while maintaining appropriate intake of nutrient and fiber, sodium and fats, and appropriate  energy expenditure required for the weight goal.;Weight Management: Provide education and appropriate resources to help participant work on and attain dietary goals.    Admit Weight  146 lb 13.2 oz (66.6 kg)    Expected Outcomes  Short Term: Continue to assess and modify interventions until short term weight is achieved;Weight Maintenance: Understanding of the daily nutrition guidelines, which includes 25-35% calories from fat, 7% or less cal from saturated fats, less than 240m cholesterol, less than 1.5gm of sodium, & 5 or more servings of fruits and vegetables daily;Long Term: Adherence to nutrition and physical activity/exercise program aimed toward attainment of established weight goal;Weight Loss: Understanding of general recommendations for a balanced deficit meal plan, which promotes 1-2 lb weight loss per week and includes a negative energy balance of [id number] kcal/d;Understanding recommendations for meals to include 15-35% energy as protein, 25-35% energy from fat, 35-60% energy from carbohydrates, less than 2076mof dietary cholesterol, 20-35 gm of total fiber daily;Understanding of distribution of calorie intake throughout the day with the consumption of 4-5 meals/snacks    Diabetes  Yes    Intervention  Provide education about signs/symptoms and action to take for hypo/hyperglycemia.;Provide education about proper nutrition,  including hydration, and aerobic/resistive exercise prescription along with prescribed medications to achieve blood glucose in normal ranges: Fasting glucose 65-99 mg/dL    Expected Outcomes  Short Term: Participant verbalizes understanding of the signs/symptoms and immediate care of hyper/hypoglycemia, proper foot care and importance of medication, aerobic/resistive exercise and nutrition plan for blood glucose control.;Long Term: Attainment of HbA1C < 7%.    Heart Failure  Yes    Intervention  Provide a combined exercise and nutrition program that is supplemented with education, support and counseling about heart failure. Directed toward relieving symptoms such as shortness of breath, decreased exercise tolerance, and extremity edema.    Expected Outcomes  Improve functional capacity of life;Short term: Attendance in program 2-3 days a week with increased exercise capacity. Reported lower sodium intake. Reported increased fruit and vegetable intake. Reports medication compliance.;Short term: Daily weights obtained and reported for increase. Utilizing diuretic protocols set by physician.;Long term: Adoption of self-care skills and reduction of barriers for early signs and symptoms recognition and intervention leading to self-care maintenance.    Hypertension  Yes    Intervention  Provide education on lifestyle modifcations including regular physical activity/exercise, weight management, moderate sodium restriction and increased consumption of fresh fruit, vegetables, and low fat dairy, alcohol moderation, and smoking cessation.;Monitor prescription use compliance.    Expected Outcomes  Short Term: Continued assessment and intervention until BP is < 140/903mG in hypertensive participants. < 130/35m15m in hypertensive participants with diabetes, heart failure or chronic kidney disease.;Long Term: Maintenance of blood pressure at goal levels.    Lipids  Yes    Intervention  Provide education and support  for participant on nutrition & aerobic/resistive exercise along with prescribed medications to achieve LDL <70mg69mL >40mg.41mExpected Outcomes  Short Term: Participant states understanding of desired cholesterol values and is compliant with medications prescribed. Participant is following exercise prescription and nutrition guidelines.;Long Term: Cholesterol controlled with medications as prescribed, with individualized exercise RX and with personalized nutrition plan. Value goals: LDL < 70mg, 24m> 40 mg.    Stress  Yes    Intervention  Offer individual and/or small group education and counseling on adjustment to heart disease, stress management and health-related lifestyle change. Teach and support self-help strategies.;Refer participants experiencing significant psychosocial distress to appropriate mental  health specialists for further evaluation and treatment. When possible, include family members and significant others in education/counseling sessions.    Expected Outcomes  Short Term: Participant demonstrates changes in health-related behavior, relaxation and other stress management skills, ability to obtain effective social support, and compliance with psychotropic medications if prescribed.;Long Term: Emotional wellbeing is indicated by absence of clinically significant psychosocial distress or social isolation.       Core Components/Risk Factors/Patient Goals Review:  Goals and Risk Factor Review    Row Name 11/12/19 0955 12/23/19 1618 01/22/20 1202         Core Components/Risk Factors/Patient Goals Review   Personal Goals Review  Weight Management/Obesity;Heart Failure;Lipids;Stress;Hypertension;Diabetes  Weight Management/Obesity;Heart Failure;Lipids;Stress;Hypertension;Diabetes  Weight Management/Obesity;Heart Failure;Lipids;Stress;Hypertension;Diabetes     Review  Graceanna started exercise on 11/09/19 and has done well with exercise. Deshawna's vital signs and CBG's have been stable   Louanna has been participating in virtual cardiac rehab  Pt with multiple CAD RFs willing to participate in CR exericse.  Ashanty has tolerated returning to exercise.  She had some issues with hypotension but this has resolved with changing the timing of her carvedilol.     Expected Outcomes  Trine will partcipate in phase 2 cardiac rehab until December 30. Cassia will transition to viirtual phase 2 cardiac rehab due to the [place] 19 pandemic  Cardiac rehab will resume on 01/04/20 for in person exercise  Eileene will continue to participate in CR exercise to reduce risk of CV disease.        Core Components/Risk Factors/Patient Goals at Discharge (Final Review):  Goals and Risk Factor Review - 01/22/20 1202      Core Components/Risk Factors/Patient Goals Review   Personal Goals Review  Weight Management/Obesity;Heart Failure;Lipids;Stress;Hypertension;Diabetes    Review  Pt with multiple CAD RFs willing to participate in CR exericse.  Najma has tolerated returning to exercise.  She had some issues with hypotension but this has resolved with changing the timing of her carvedilol.    Expected Outcomes  Decarla will continue to participate in CR exercise to reduce risk of CV disease.       ITP Comments: ITP Comments    Row Name 11/03/19 9147 11/23/19 0925 12/23/19 1617 01/06/20 1031 01/22/20 1155   ITP Comments  Dr. Fransico Him, Medical Director Cardiac Rehab [facility]  30 Day ITP Review. Julyssa was off to a good start to exercise. Staley has transitoned to virtual cardiac rehab as in person cardiac rehab has been placed on hold due to the [place] 19 pandemic. Mckenze plans to use the virtual CR APP will continue to monitor virtually.  30 Day ITP Review In Person Phase 2 Cardiac rehab is resuming on 01/04/20  Francie returned to exercise today after gaining clearance from her MD and tolerated it well.  VSS and she did not have any complaints of dizziness.  30 Day ITP Review.  Armie continues to tolerate  exercise well.  She had some occsional dizziness and hypotension at cardiac rehab with increasing her carvedilol.  This has resolved as Kitzia is taking her carvedilol after participating in exercise.      Comments: See ITP Comments.

## 2020-01-29 ENCOUNTER — Encounter: Payer: No Typology Code available for payment source | Admitting: Physical Therapy

## 2020-01-29 ENCOUNTER — Other Ambulatory Visit: Payer: Self-pay

## 2020-01-29 ENCOUNTER — Encounter: Payer: Self-pay | Admitting: Physical Therapy

## 2020-01-29 ENCOUNTER — Ambulatory Visit: Payer: No Typology Code available for payment source | Admitting: Physical Therapy

## 2020-01-29 ENCOUNTER — Encounter (HOSPITAL_COMMUNITY)
Admission: RE | Admit: 2020-01-29 | Discharge: 2020-01-29 | Disposition: A | Discharge status: Home / Self Care | Payer: No Typology Code available for payment source | Source: Ambulatory Visit | Attending: Cardiology | Admitting: Cardiology

## 2020-01-29 DIAGNOSIS — M542 Cervicalgia: Secondary | ICD-10-CM | POA: Diagnosis not present

## 2020-01-29 DIAGNOSIS — M62838 Other muscle spasm: Secondary | ICD-10-CM

## 2020-01-29 DIAGNOSIS — I5022 Chronic systolic (congestive) heart failure: Secondary | ICD-10-CM

## 2020-01-29 NOTE — Patient Instructions (Addendum)

## 2020-01-29 NOTE — Therapy (Signed)
[place], [place], [address] Phone: [phone]   Fax:  [phone]  Physical Therapy Treatment  Patient Details  Name: Toni [name] MRN: [id number] Date of Birth: October 06, 1956 Referring Provider (PT): Dr Donald Prose MD    Encounter Date: 01/29/2020  PT End of Session - 01/29/20 1001    Visit Number  4    Number of Visits  12    Date for PT Re-Evaluation  02/25/20    PT Start Time  1001    PT Stop Time  1040    PT Time Calculation (min)  39 min    Activity Tolerance  Patient tolerated treatment well    Behavior During Therapy  [facility] for tasks assessed/performed       Past Medical History:  Diagnosis Date  . CHF (congestive heart failure) ([place])   . Diabetes mellitus without complication ([place])   . Hypertension     Past Surgical History:  Procedure Laterality Date  . ABDOMINAL AORTIC ANEURYSM REPAIR    . ABDOMINAL HYSTERECTOMY    . CARDIAC CATHETERIZATION     in [facility], clean, per pt.  . ICD IMPLANT N/A 06/18/2019   Procedure: ICD IMPLANT;  Surgeon: Evans Lance, MD;  Location: [place] [facility];  Service: Cardiovascular;  Laterality: N/A;  . RIGHT/LEFT HEART CATH AND CORONARY ANGIOGRAPHY N/A 11/17/2018   Procedure: RIGHT/LEFT HEART CATH AND CORONARY ANGIOGRAPHY;  Surgeon: Belva Crome, MD;  Location: [place] [facility];  Service: Cardiovascular;  Laterality: N/A;    There were no vitals filed for this visit.  Subjective Assessment - 01/29/20 1001    Subjective  "the neck is much better today, I still have some sorneess but really not bad. the issue ismore when i try to position my self to sleep"    Patient Stated Goals  Patient wants her headaches to stop. She wants to get off her medicine    Currently in Pain?  Yes    Pain Score  3     Pain Location  Neck    Pain Descriptors / Indicators  Aching    Pain Type  Chronic pain    Pain Onset  More than a month ago    Pain Frequency   Intermittent         [facility] PT Assessment - 01/29/20 0001      Assessment   Medical Diagnosis  Cervicalgia     Referring Provider (PT)  Dr Donald Prose MD                    [facility] Adult PT Treatment/Exercise - 01/29/20 0001      Self-Care   Self-Care  Posture    Posture  reviewed efficent posture and lifting mechanics and provided handout   reviewed concurrent to using arm bike     Neck Exercises: Machines for Strengthening   UBE (Upper Arm Bike)  L1 x 4 min    fwd/bwd x 50min     Neck Exercises: Theraband   Rows  10 reps;Green   x 2 sets   Other Theraband Exercises  sustained horizontal abduction combined with flxion/ extension 2 x 10 with green theraband  in supine    Other Theraband Exercises  scapular retraction with bil ER 2 x 10 in supine   squeezing towel between the shoulder blades     Neck Exercises: Supine   Other Supine Exercise  foam roll routine ceiling punches,  horizontal abd/add, alternating ceiling punches , x to Y and back stroke 1 x 15 ea.      Lumbar Exercises: Supine   Dead Bug  5 reps   10 second hold     Neck Exercises: Stretches   Upper Trapezius Stretch  1 rep;30 seconds;Right    Levator Stretch  1 rep;30 seconds             PT Education - 01/29/20 1016    Education Details  efficient posture / lifting mechanics with handout. updated HEP for foam roll routine.    Person(s) Educated  Patient    Methods  Explanation;Verbal cues    Comprehension  Verbalized understanding;Verbal cues required       PT Short Term Goals - 01/14/20 1328      PT SHORT TERM GOAL #1   Title  Patient will increase cervical flexion and extension by 10 degrees bilateral    Time  3    Period  Weeks    Status  New    Target Date  02/04/20      PT SHORT TERM GOAL #2   Title  Patient will increase right shoulder strength to 5/5    Time  3    Period  Weeks    Status  New    Target Date  02/04/20      PT SHORT TERM GOAL #3   Title  Patient will  increase bilateral cervical rotation by 20%    Time  3    Period  Weeks    Status  New    Target Date  02/04/20        PT Long Term Goals - 01/14/20 1331      PT LONG TERM GOAL #1   Title  Patient will dmeonstrate functional right internal rotation to t-12 in order to perfrom ADL's    Time  6    Period  Weeks    Status  New    Target Date  02/25/20      PT LONG TERM GOAL #2   Title  Patient will turn head 60 degrees bilateral without pain in order to improve safety driving    Time  6    Period  Weeks    Status  New      PT LONG TERM GOAL #3   Title  Patient will read for 1 hour without increased pain in her neck    Time  6    Period  Weeks    Status  New    Target Date  02/25/20            Plan - 01/29/20 1030    Clinical Impression Statement  pt reports improvement in pain but does state she has soreness in th shoulders from TPDN last session. due to pt feeling sore opted to hold off on TPDN and focus on posterior shoulder strengthening which she did very well with. updated HEP foam roll routine for scapulothoracic region. end of session she reported decreased pain.    PT Treatment/Interventions  ADLs/Self Care Home Management;Cryotherapy;Moist Heat;Traction;Functional mobility training;Therapeutic activities;Therapeutic exercise;Neuromuscular re-education;Patient/family education;Manual lymph drainage;Passive range of motion;Taping;Dry needling    PT Next Visit Plan  Has pacemaker; softtissue mobilization to cervical spine; consider needling if patient is comfortable with it; IASTYM to upper traps; gentle traction; give stretching if range has improved; give light posterior chain stretching    PT Home Exercise Plan  scap retraction; gentle roation in pain free ranges,  foam roll rountine,posture handout    Consulted and Agree with Plan of Care  Patient       Patient will benefit from skilled therapeutic intervention in order to improve the following deficits and  impairments:  Improper body mechanics, Dizziness, Decreased range of motion, Decreased activity tolerance, Decreased safety awareness, Increased muscle spasms, Postural dysfunction, Pain  Visit Diagnosis: Cervicalgia  Other muscle spasm     Problem List Patient Active Problem List   Diagnosis Date Noted  . 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 Nonischemic cardiomyopathy ([place]) 06/04/2019  . Hypomagnesemia 11/18/2018  . Hypokalemia 11/15/2018  . Diabetes mellitus without complication ([place])   . Coronary artery disease   . Acute on chronic systolic CHF (congestive heart failure) ([place]) 01/04/2016  . Hyperlipidemia 01/03/2016  . Cardiomegaly - hypertensive 01/03/2016  . DM (diabetes mellitus), type 2 with complications ([place]) 123456  . Hypertension 01/03/2014   Starr Lake PT, DPT, LAT, ATC  01/29/20  10:40 AM      [facility] [address] [place], [place], [address] Phone: [phone]   Fax:  [phone]  Name: AMAZIN LASSWELL MRN: [id number] Date of Birth: 01/19/1956

## 2020-02-01 ENCOUNTER — Encounter: Payer: Self-pay | Admitting: Physical Therapy

## 2020-02-01 ENCOUNTER — Encounter (HOSPITAL_COMMUNITY): Payer: Self-pay | Admitting: *Deleted

## 2020-02-01 ENCOUNTER — Other Ambulatory Visit: Payer: Self-pay

## 2020-02-01 ENCOUNTER — Ambulatory Visit: Payer: No Typology Code available for payment source | Admitting: Physical Therapy

## 2020-02-01 ENCOUNTER — Encounter (HOSPITAL_COMMUNITY)
Admission: RE | Admit: 2020-02-01 | Discharge: 2020-02-01 | Disposition: A | Discharge status: Home / Self Care | Payer: No Typology Code available for payment source | Source: Ambulatory Visit | Attending: Cardiology | Admitting: Cardiology

## 2020-02-01 DIAGNOSIS — G8929 Other chronic pain: Secondary | ICD-10-CM

## 2020-02-01 DIAGNOSIS — I5022 Chronic systolic (congestive) heart failure: Secondary | ICD-10-CM

## 2020-02-01 DIAGNOSIS — M25612 Stiffness of left shoulder, not elsewhere classified: Secondary | ICD-10-CM

## 2020-02-01 DIAGNOSIS — M542 Cervicalgia: Secondary | ICD-10-CM

## 2020-02-01 DIAGNOSIS — M25512 Pain in left shoulder: Secondary | ICD-10-CM

## 2020-02-01 DIAGNOSIS — M62838 Other muscle spasm: Secondary | ICD-10-CM

## 2020-02-01 DIAGNOSIS — R293 Abnormal posture: Secondary | ICD-10-CM

## 2020-02-01 DIAGNOSIS — M6281 Muscle weakness (generalized): Secondary | ICD-10-CM

## 2020-02-01 NOTE — Therapy (Signed)
[place], [place], [address] Phone: [phone]   Fax:  [phone]  Physical Therapy Treatment  Patient Details  Name: Toni [name] MRN: [id number] Date of Birth: [birth year] Referring Provider (PT): Dr Donald Prose MD    Encounter Date: 02/01/2020  PT End of Session - 02/01/20 1353    Visit Number  5    Number of Visits  12    Date for PT Re-Evaluation  02/25/20    PT Start Time  1330    PT Stop Time  1410    PT Time Calculation (min)  40 min    Activity Tolerance  Patient tolerated treatment well    Behavior During Therapy  [facility] for tasks assessed/performed       Past Medical History:  Diagnosis Date  . CHF (congestive heart failure) ([place])   . Diabetes mellitus without complication ([place])   . Hypertension     Past Surgical History:  Procedure Laterality Date  . ABDOMINAL AORTIC ANEURYSM REPAIR    . ABDOMINAL HYSTERECTOMY    . CARDIAC CATHETERIZATION     in [facility], clean, per pt.  . ICD IMPLANT N/A 06/18/2019   Procedure: ICD IMPLANT;  Surgeon: Evans Lance, MD;  Location: [place] [facility];  Service: Cardiovascular;  Laterality: N/A;  . RIGHT/LEFT HEART CATH AND CORONARY ANGIOGRAPHY N/A 11/17/2018   Procedure: RIGHT/LEFT HEART CATH AND CORONARY ANGIOGRAPHY;  Surgeon: Belva Crome, MD;  Location: [place] [facility];  Service: Cardiovascular;  Laterality: N/A;    There were no vitals filed for this visit.  Subjective Assessment - 02/01/20 1347    Subjective  Patient reports her neck has been doing well. She has had some popping but no pain.    Pertinent History  Left Frozen Shoulder    Limitations  Walking;Standing    How long can you sit comfortably?  At times sitting causes her head to get heavey and she needs to lie down    How long can you stand comfortably?  depdns on activity.    Diagnostic tests  Nothing post accident    Currently in Pain?  No/denies                        [facility] Adult PT Treatment/Exercise - 02/01/20 0001      Neck Exercises: Standing   Other Standing Exercises  shoulder extension 2x10 yellow; scap retraction 2x10 red       Neck Exercises: Seated   Other Seated Exercise  bialteral ER 2x10 red; horizontal abdcution 2x10 ;       Neck Exercises: Supine   Other Supine Exercise  wand flexion 2x10      Manual Therapy   Manual Therapy  Joint mobilization    Manual therapy comments  skilled palpation and monitoring of pt throughou tTPDN    Soft tissue mobilization  IASTM to bil upper trap/ levator scapuale    Manual Traction  gentle manual traction to the cervical spine              PT Education - 02/01/20 1352    Education Details  reviewed progression of exercises    Person(s) Educated  Patient    Methods  Explanation;Demonstration;Tactile cues;Verbal cues    Comprehension  Verbalized understanding;Returned demonstration;Verbal cues required;Tactile cues required       PT Short Term Goals - 02/01/20 1555      PT SHORT TERM GOAL #  1   Title  Patient will increase cervical flexion and extension by 10 degrees bilateral    Time  3    Period  Weeks    Status  New    Target Date  02/04/20      PT SHORT TERM GOAL #2   Title  Patient will increase right shoulder strength to 5/5    Time  3    Period  Weeks    Status  On-going    Target Date  02/04/20      PT SHORT TERM GOAL #3   Title  Patient will increase bilateral cervical rotation by 20%    Time  3    Period  Weeks    Status  On-going    Target Date  02/04/20      PT SHORT TERM GOAL #4   Title  increase L grip strength by >/= 10# to demo improving  L shoulder function    Time  4    Period  Weeks    Status  On-going    Target Date  07/13/19        PT Long Term Goals - 01/14/20 1331      PT LONG TERM GOAL #1   Title  Patient will dmeonstrate functional right internal rotation to t-12 in order to perfrom ADL's    Time  6     Period  Weeks    Status  New    Target Date  02/25/20      PT LONG TERM GOAL #2   Title  Patient will turn head 60 degrees bilateral without pain in order to improve safety driving    Time  6    Period  Weeks    Status  New      PT LONG TERM GOAL #3   Title  Patient will read for 1 hour without increased pain in her neck    Time  6    Period  Weeks    Status  New    Target Date  02/25/20            Plan - 02/01/20 1355    Clinical Impression Statement  The patient is making great progrss. She tolerated ther-ex well. She had mild tightness at the end of strengthening but overall tolerated well. She continues to have minor spasminbg of the upper trap. Therapy will cancel treatment for Wedensday. She will take a week and manage her exercises and stretches. If she does well therapy may D/C to HEp or put on hold. If she is having pain we will continue with needling and manual therapy.    Comorbidities  pacemaker, CHF, anxiety    Examination-Participation Restrictions  Cleaning;Community Activity;Driving    Stability/Clinical Decision Making  Evolving/Moderate complexity    Clinical Decision Making  Moderate    Rehab Potential  Good    PT Frequency  2x / week    PT Duration  6 weeks    PT Treatment/Interventions  ADLs/Self Care Home Management;Cryotherapy;Moist Heat;Traction;Functional mobility training;Therapeutic activities;Therapeutic exercise;Neuromuscular re-education;Patient/family education;Manual lymph drainage;Passive range of motion;Taping;Dry needling    PT Next Visit Plan  Has pacemaker; softtissue mobilization to cervical spine; consider needling if patient is comfortable with it; IASTYM to upper traps; gentle traction; give stretching if range has improved; give light posterior chain stretching    PT Home Exercise Plan  scap retraction; gentle roation in pain free ranges,  foam roll rountine,posture handout    Consulted and Agree with  Plan of Care  Patient        Patient will benefit from skilled therapeutic intervention in order to improve the following deficits and impairments:  Improper body mechanics, Dizziness, Decreased range of motion, Decreased activity tolerance, Decreased safety awareness, Increased muscle spasms, Postural dysfunction, Pain  Visit Diagnosis: Cervicalgia  Other muscle spasm  Stiffness of left shoulder, not elsewhere classified  Chronic left shoulder pain  Muscle weakness (generalized)  Abnormal posture     Problem List Patient Active Problem List   Diagnosis Date Noted  . 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 Nonischemic cardiomyopathy ([place]) 06/04/2019  . Hypomagnesemia 11/18/2018  . Hypokalemia 11/15/2018  . Diabetes mellitus without complication ([place])   . Coronary artery disease   . Acute on chronic systolic CHF (congestive heart failure) ([place]) 01/04/2016  . Hyperlipidemia 01/03/2016  . Cardiomegaly - hypertensive 01/03/2016  . DM (diabetes mellitus), type 2 with complications ([place]) 123456  . Hypertension 01/03/2014    Carney Living PT DPT  02/01/2020, 4:00 PM  [facility] [address] [place], [place], [address] Phone: [phone]   Fax:  [phone]  Name: SOFFIA MAYA MRN: [id number] Date of Birth: Sep 02, 1956

## 2020-02-01 NOTE — Progress Notes (Signed)
Received forms from [employer] needing more information regarding pt's leave of absence.  Spoke w/pt via phone, she states she has been out of work since about Dec, she states she willcomplete cardiac rehab on 4/2 and would like to discuss w/Dr Aundra Dubin before returning to work.  Forms completed stating pt is out of work until at least 03/29/20 (next appt w/Dr Aundra Dubin), signed by Dr Aundra Dubin and faxed to [employer] at [phone], copy mailed to pt for her records.

## 2020-02-02 ENCOUNTER — Encounter: Payer: No Typology Code available for payment source | Admitting: Physical Therapy

## 2020-02-03 ENCOUNTER — Other Ambulatory Visit: Payer: Self-pay

## 2020-02-03 ENCOUNTER — Encounter (HOSPITAL_COMMUNITY)
Admission: RE | Admit: 2020-02-03 | Discharge: 2020-02-03 | Disposition: A | Discharge status: Home / Self Care | Payer: No Typology Code available for payment source | Source: Ambulatory Visit | Attending: Cardiology | Admitting: Cardiology

## 2020-02-03 ENCOUNTER — Ambulatory Visit: Payer: No Typology Code available for payment source | Admitting: Physical Therapy

## 2020-02-03 DIAGNOSIS — M542 Cervicalgia: Secondary | ICD-10-CM | POA: Diagnosis not present

## 2020-02-03 DIAGNOSIS — I5022 Chronic systolic (congestive) heart failure: Secondary | ICD-10-CM

## 2020-02-03 MED FILL — LOSARTAN POTASSIUM 25 MG TA: 25 | 30 days supply | Qty: 60 | Fill #3

## 2020-02-04 ENCOUNTER — Encounter: Payer: No Typology Code available for payment source | Admitting: Physical Therapy

## 2020-02-04 MED FILL — glipiZIDE ER 10 MG TB24: 10 | 90 days supply | Qty: 180 | Fill #1

## 2020-02-05 ENCOUNTER — Encounter (HOSPITAL_COMMUNITY)
Admission: RE | Admit: 2020-02-05 | Discharge: 2020-02-05 | Disposition: A | Discharge status: Home / Self Care | Payer: No Typology Code available for payment source | Source: Ambulatory Visit | Attending: Cardiology | Admitting: Cardiology

## 2020-02-05 ENCOUNTER — Other Ambulatory Visit: Payer: Self-pay

## 2020-02-05 DIAGNOSIS — M542 Cervicalgia: Secondary | ICD-10-CM | POA: Diagnosis not present

## 2020-02-05 DIAGNOSIS — I5022 Chronic systolic (congestive) heart failure: Secondary | ICD-10-CM

## 2020-02-08 ENCOUNTER — Encounter (HOSPITAL_COMMUNITY)
Admission: RE | Admit: 2020-02-08 | Discharge: 2020-02-08 | Disposition: A | Discharge status: Home / Self Care | Payer: No Typology Code available for payment source | Source: Ambulatory Visit | Attending: Cardiology | Admitting: Cardiology

## 2020-02-08 ENCOUNTER — Other Ambulatory Visit: Payer: Self-pay

## 2020-02-08 ENCOUNTER — Encounter: Payer: Self-pay | Admitting: Physical Therapy

## 2020-02-08 ENCOUNTER — Ambulatory Visit: Payer: No Typology Code available for payment source | Admitting: Physical Therapy

## 2020-02-08 DIAGNOSIS — M62838 Other muscle spasm: Secondary | ICD-10-CM

## 2020-02-08 DIAGNOSIS — M542 Cervicalgia: Secondary | ICD-10-CM | POA: Diagnosis not present

## 2020-02-08 DIAGNOSIS — M25612 Stiffness of left shoulder, not elsewhere classified: Secondary | ICD-10-CM

## 2020-02-08 DIAGNOSIS — I5022 Chronic systolic (congestive) heart failure: Secondary | ICD-10-CM

## 2020-02-08 NOTE — Therapy (Signed)
[place], [place], [address] Phone: [phone]   Fax:  [phone]  Physical Therapy Treatment  Patient Details  Name: Toni [name] MRN: [id number] Date of Birth: 06/17/1956 Referring Provider (PT): Dr Donald Prose MD    Encounter Date: 02/08/2020  PT End of Session - 02/08/20 1116    Visit Number  6    Number of Visits  12    Date for PT Re-Evaluation  02/25/20    PT Start Time  1100    PT Stop Time  1138    PT Time Calculation (min)  38 min    Activity Tolerance  Patient tolerated treatment well    Behavior During Therapy  [facility] for tasks assessed/performed       Past Medical History:  Diagnosis Date  . CHF (congestive heart failure) ([place])   . Diabetes mellitus without complication ([place])   . Hypertension     Past Surgical History:  Procedure Laterality Date  . ABDOMINAL AORTIC ANEURYSM REPAIR    . ABDOMINAL HYSTERECTOMY    . CARDIAC CATHETERIZATION     in [facility], clean, per pt.  . ICD IMPLANT N/A 06/18/2019   Procedure: ICD IMPLANT;  Surgeon: Evans Lance, MD;  Location: [place] [facility];  Service: Cardiovascular;  Laterality: N/A;  . RIGHT/LEFT HEART CATH AND CORONARY ANGIOGRAPHY N/A 11/17/2018   Procedure: RIGHT/LEFT HEART CATH AND CORONARY ANGIOGRAPHY;  Surgeon: Belva Crome, MD;  Location: [place] [facility];  Service: Cardiovascular;  Laterality: N/A;    There were no vitals filed for this visit.  Subjective Assessment - 02/08/20 1105    Subjective  Patient did the weights at cardiac rehab. She was sore and had a heachaceh for a few days but she was able to use her stretches and exercises to get her pain back down. She is having no pain today.    Pertinent History  Left Frozen Shoulder    Limitations  Walking;Standing    How long can you sit comfortably?  At times sitting causes her head to get heavey and she needs to lie down    How long can you stand comfortably?  depdns on  activity.    Diagnostic tests  Nothing post accident    Patient Stated Goals  Patient wants her headaches to stop. She wants to get off her medicine    Currently in Pain?  No/denies                       [facility] Adult PT Treatment/Exercise - 02/08/20 0001      Neck Exercises: Standing   Other Standing Exercises  standing flexion x15 Ys x15 1lb each     Other Standing Exercises  shoulder extension 2x10 yellow; scap retraction 2x10 red       Manual Therapy   Manual Therapy  Joint mobilization    Manual therapy comments  skilled palpation and monitoring of pt throughou tTPDN    Soft tissue mobilization  IASTM to bil upper trap/ levator scapuale    Manual Traction  gentle manual traction to the cervical spine       Neck Exercises: Stretches   Upper Trapezius Stretch  30 seconds;Right;2 reps    Levator Stretch  30 seconds;2 reps             PT Education - 02/08/20 1106    Education Details  reviewed posture with ther-ex    Person(s)  Educated  Patient    Methods  Explanation;Demonstration;Tactile cues;Verbal cues    Comprehension  Verbal cues required;Returned demonstration;Verbalized understanding       PT Short Term Goals - 02/08/20 1200      PT SHORT TERM GOAL #1   Title  Patient will increase cervical flexion and extension by 10 degrees bilateral    Time  3    Status  On-going    Target Date  02/04/20      PT SHORT TERM GOAL #2   Title  Patient will increase right shoulder strength to 5/5    Time  3    Period  Weeks    Status  On-going      PT SHORT TERM GOAL #3   Title  Patient will increase bilateral cervical rotation by 20%    Time  3    Period  Weeks    Status  On-going    Target Date  02/04/20        PT Long Term Goals - 01/14/20 1331      PT LONG TERM GOAL #1   Title  Patient will dmeonstrate functional right internal rotation to t-12 in order to perfrom ADL's    Time  6    Period  Weeks    Status  New    Target Date  02/25/20       PT LONG TERM GOAL #2   Title  Patient will turn head 60 degrees bilateral without pain in order to improve safety driving    Time  6    Period  Weeks    Status  New      PT LONG TERM GOAL #3   Title  Patient will read for 1 hour without increased pain in her neck    Time  6    Period  Weeks    Status  New    Target Date  02/25/20            Plan - 02/08/20 1119    Clinical Impression Statement  Patient continues to have some tension in her upper traps and lower cervical spine but it has imrpvoed.She did not react well to 2lb weights at cardiac rehab so therapy started her lower today and just had her do a few things. Sh etolerated well.    Personal Factors and Comorbidities  Comorbidity 1;Comorbidity 3+;Comorbidity 2    Comorbidities  pacemaker, CHF, anxiety    Examination-Activity Limitations  Lift;Reach Overhead;Sit    Examination-Participation Restrictions  Cleaning;Community Activity;Driving    Stability/Clinical Decision Making  Evolving/Moderate complexity    Clinical Decision Making  Moderate    Rehab Potential  Good    PT Frequency  2x / week    PT Duration  6 weeks    PT Treatment/Interventions  ADLs/Self Care Home Management;Cryotherapy;Moist Heat;Traction;Functional mobility training;Therapeutic activities;Therapeutic exercise;Neuromuscular re-education;Patient/family education;Manual lymph drainage;Passive range of motion;Taping;Dry needling    PT Next Visit Plan  Has pacemaker; softtissue mobilization to cervical spine; consider needling if patient is comfortable with it; IASTYM to upper traps; gentle traction; give stretching if range has improved; give light posterior chain stretching    PT Home Exercise Plan  scap retraction; gentle roation in pain free ranges,  foam roll rountine,posture handout    Consulted and Agree with Plan of Care  Patient       Patient will benefit from skilled therapeutic intervention in order to improve the following deficits and  impairments:  Improper body mechanics, Dizziness, Decreased  range of motion, Decreased activity tolerance, Decreased safety awareness, Increased muscle spasms, Postural dysfunction, Pain  Visit Diagnosis: Cervicalgia  Other muscle spasm  Stiffness of left shoulder, not elsewhere classified     Problem List Patient Active Problem List   Diagnosis Date Noted  . 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 Nonischemic cardiomyopathy ([place]) 06/04/2019  . Hypomagnesemia 11/18/2018  . Hypokalemia 11/15/2018  . Diabetes mellitus without complication ([place])   . Coronary artery disease   . Acute on chronic systolic CHF (congestive heart failure) ([place]) 01/04/2016  . Hyperlipidemia 01/03/2016  . Cardiomegaly - hypertensive 01/03/2016  . DM (diabetes mellitus), type 2 with complications ([place]) 123456  . Hypertension 01/03/2014    Carney Living PT DPT  02/08/2020, 12:04 PM  [facility] [address] [place], [place], [address] Phone: [phone]   Fax:  [phone]  Name: Toni [name] MRN: [id number] Date of Birth: 08/12/1956

## 2020-02-09 ENCOUNTER — Encounter: Payer: No Typology Code available for payment source | Admitting: Physical Therapy

## 2020-02-10 ENCOUNTER — Other Ambulatory Visit: Payer: Self-pay

## 2020-02-10 ENCOUNTER — Encounter (HOSPITAL_COMMUNITY)
Admission: RE | Admit: 2020-02-10 | Discharge: 2020-02-10 | Disposition: A | Discharge status: Home / Self Care | Payer: No Typology Code available for payment source | Source: Ambulatory Visit | Attending: Cardiology | Admitting: Cardiology

## 2020-02-10 ENCOUNTER — Encounter: Payer: Self-pay | Admitting: Physical Therapy

## 2020-02-10 ENCOUNTER — Ambulatory Visit: Payer: No Typology Code available for payment source | Admitting: Physical Therapy

## 2020-02-10 DIAGNOSIS — I5022 Chronic systolic (congestive) heart failure: Secondary | ICD-10-CM

## 2020-02-10 DIAGNOSIS — M25512 Pain in left shoulder: Secondary | ICD-10-CM

## 2020-02-10 DIAGNOSIS — M6281 Muscle weakness (generalized): Secondary | ICD-10-CM

## 2020-02-10 DIAGNOSIS — G8929 Other chronic pain: Secondary | ICD-10-CM

## 2020-02-10 DIAGNOSIS — R293 Abnormal posture: Secondary | ICD-10-CM

## 2020-02-10 DIAGNOSIS — M62838 Other muscle spasm: Secondary | ICD-10-CM

## 2020-02-10 DIAGNOSIS — M542 Cervicalgia: Secondary | ICD-10-CM

## 2020-02-10 DIAGNOSIS — M25612 Stiffness of left shoulder, not elsewhere classified: Secondary | ICD-10-CM

## 2020-02-10 NOTE — Therapy (Signed)
[place], [place], [address] Phone: [phone]   Fax:  [phone]  Physical Therapy Treatment/Discharge   Patient Details  Name: Toni [name] MRN: [id number] Date of Birth: 1956/02/02 Referring Provider (PT): Dr Donald Prose MD    Encounter Date: 02/10/2020  PT End of Session - 02/10/20 1102    Visit Number  7    Number of Visits  12    Date for PT Re-Evaluation  02/25/20    PT Start Time  8144    PT Stop Time  1130   discharge visit.   PT Time Calculation (min)  32 min       Past Medical History:  Diagnosis Date  . CHF (congestive heart failure) ([place])   . Diabetes mellitus without complication ([place])   . Hypertension     Past Surgical History:  Procedure Laterality Date  . ABDOMINAL AORTIC ANEURYSM REPAIR    . ABDOMINAL HYSTERECTOMY    . CARDIAC CATHETERIZATION     in [facility], clean, per pt.  . ICD IMPLANT N/A 06/18/2019   Procedure: ICD IMPLANT;  Surgeon: Evans Lance, MD;  Location: [facility];  Service: Cardiovascular;  Laterality: N/A;  . RIGHT/LEFT HEART CATH AND CORONARY ANGIOGRAPHY N/A 11/17/2018   Procedure: RIGHT/LEFT HEART CATH AND CORONARY ANGIOGRAPHY;  Surgeon: Belva Crome, MD;  Location: [place] [facility];  Service: Cardiovascular;  Laterality: N/A;    There were no vitals filed for this visit.  Subjective Assessment - 02/10/20 1101    Subjective  Patient reports no pain today. She was able to do cardiac rehab with no neck pain. She has been working on her stretches and exercises.    Pertinent History  Left Frozen Shoulder    Limitations  Walking;Standing    How long can you sit comfortably?  At times sitting causes her head to get heavey and she needs to lie down    How long can you stand comfortably?  depdns on activity.    Diagnostic tests  Nothing post accident    Patient Stated Goals  Patient wants her headaches to stop. She wants to get off her medicine     Currently in Pain?  No/denies         [facility] PT Assessment - 02/10/20 0001      AROM   Cervical Flexion  42    Cervical Extension  40    Cervical - Right Rotation  70    Cervical - Left Rotation  65                   [facility] Adult PT Treatment/Exercise - 02/10/20 0001      Self-Care   Other Self-Care Comments   reviewed use of HEP for home mangement       Neck Exercises: Standing   Other Standing Exercises  standing flexion x15 Ys x15 1lb each     Other Standing Exercises  shoulder extension 2x10 yellow; scap retraction 2x10 red       Neck Exercises: Seated   Other Seated Exercise  bialteral ER 2x10 red; horizontal abdcution 2x10 ;       Neck Exercises: Supine   Other Supine Exercise  wand flexion 2x10       Manual Therapy   Manual Therapy  Joint mobilization    Manual therapy comments  skilled palpation and monitoring of pt throughou tTPDN    Soft tissue mobilization  IASTM  to bil upper trap/ levator scapuale    Manual Traction  gentle manual traction to the cervical spine       Neck Exercises: Stretches   Upper Trapezius Stretch  30 seconds;Right;2 reps    Levator Stretch  30 seconds;2 reps             PT Education - 02/10/20 1102    Education Details  HEP and symptom mangement    Person(s) Educated  Patient    Methods  Explanation;Demonstration;Tactile cues;Verbal cues    Comprehension  Verbalized understanding;Verbal cues required;Returned demonstration;Tactile cues required       PT Short Term Goals - 02/10/20 1602      PT SHORT TERM GOAL #1   Title  Patient will increase cervical flexion and extension by 10 degrees bilateral    Time  3    Period  Weeks    Status  On-going    Target Date  02/04/20      PT SHORT TERM GOAL #2   Title  Patient will increase right shoulder strength to 5/5    Time  3    Period  Weeks    Status  Achieved      PT SHORT TERM GOAL #3   Title  Patient will increase bilateral cervical rotation by 20%     Time  3    Period  Weeks    Status  Achieved    Target Date  02/04/20      PT SHORT TERM GOAL #4   Title  increase L grip strength by >/= 10# to demo improving  L shoulder function    Time  4    Period  Weeks    Status  Achieved        PT Long Term Goals - 02/10/20 1603      PT LONG TERM GOAL #1   Title  Patient will dmeonstrate functional right internal rotation to t-12 in order to perfrom ADL's    Time  6    Period  Weeks    Status  Achieved      PT LONG TERM GOAL #2   Title  Patient will turn head 60 degrees bilateral without pain in order to improve safety driving    Time  6    Period  Weeks    Status  Achieved      PT LONG TERM GOAL #3   Title  Patient will read for 1 hour without increased pain in her neck    Baseline  no pain reading    Time  6    Period  Weeks    Status  Achieved      PT LONG TERM GOAL #4   Title  increase FOTO score to </=36% limited to demo improvement in function    Baseline  27%    Time  8    Period  Weeks    Status  Achieved      PT LONG TERM GOAL #5   Title  pt to be I with all HEP given as of last visit to maintain and progress current level of function    Time  8    Period  Weeks    Status  Achieved            Plan - 02/10/20 1111    Clinical Impression Statement  Patient has made great progress. her neck range and shoulder strength have improved significantly. She is no longer having pain. She  had an incedence of pain last week and was able to manage it using her HEP. D/C to HEP. See below for goal specific progress.    Personal Factors and Comorbidities  Comorbidity 1;Comorbidity 3+;Comorbidity 2    Comorbidities  pacemaker, CHF, anxiety    Examination-Activity Limitations  Lift;Reach Overhead;Sit    Stability/Clinical Decision Making  Evolving/Moderate complexity    Clinical Decision Making  Moderate    Rehab Potential  Good    PT Frequency  2x / week    PT Duration  6 weeks    PT Treatment/Interventions  ADLs/Self  Care Home Management;Cryotherapy;Moist Heat;Traction;Functional mobility training;Therapeutic activities;Therapeutic exercise;Neuromuscular re-education;Patient/family education;Manual lymph drainage;Passive range of motion;Taping;Dry needling    PT Next Visit Plan  Has pacemaker; softtissue mobilization to cervical spine; consider needling if patient is comfortable with it; IASTYM to upper traps; gentle traction; give stretching if range has improved; give light posterior chain stretching    PT Home Exercise Plan  scap retraction; gentle roation in pain free ranges,  foam roll rountine,posture handout    Consulted and Agree with Plan of Care  Patient       Patient will benefit from skilled therapeutic intervention in order to improve the following deficits and impairments:  Improper body mechanics, Dizziness, Decreased range of motion, Decreased activity tolerance, Decreased safety awareness, Increased muscle spasms, Postural dysfunction, Pain  Visit Diagnosis: Cervicalgia  Other muscle spasm  Stiffness of left shoulder, not elsewhere classified  Chronic left shoulder pain  Muscle weakness (generalized)  Abnormal posture    PHYSICAL THERAPY DISCHARGE SUMMARY  Visits from Start of Care: 7  Current functional level related to goals / functional outcomes: Improved neck pain and function with left UE    Remaining deficits: None    Education / Equipment: HEP   Plan: Patient agrees to discharge.  Patient goals were met. Patient is being discharged due to meeting the stated rehab goals.  ?????      Problem List Patient Active Problem List   Diagnosis Date Noted  . 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 Nonischemic cardiomyopathy ([place]) 06/04/2019  . Hypomagnesemia 11/18/2018  . Hypokalemia 11/15/2018  . Diabetes mellitus without complication ([place])   . Coronary artery disease   . Acute on chronic systolic CHF (congestive heart failure) ([place])  01/04/2016  . Hyperlipidemia 01/03/2016  . Cardiomegaly - hypertensive 01/03/2016  . DM (diabetes mellitus), type 2 with complications ([place]) 26/94/8546  . Hypertension 01/03/2014    Carney Living PT DPT  02/10/2020, 4:07 PM  [facility] [address] [place], [place], [address] Phone: [phone]   Fax:  [phone]  Name: Toni [name] MRN: [id number] Date of Birth: 12-Nov-1956

## 2020-02-11 ENCOUNTER — Encounter: Payer: No Typology Code available for payment source | Admitting: Physical Therapy

## 2020-02-11 MED FILL — ISOSORBIDE DN 20 MG TABLET: 20 | 30 days supply | Qty: 90 | Fill #1

## 2020-02-11 MED FILL — FUROSEMIDE 20 MG TABS: 20 | 30 days supply | Qty: 30 | Fill #4

## 2020-02-12 ENCOUNTER — Other Ambulatory Visit: Payer: Self-pay

## 2020-02-12 ENCOUNTER — Encounter (HOSPITAL_COMMUNITY)
Admission: RE | Admit: 2020-02-12 | Discharge: 2020-02-12 | Disposition: A | Discharge status: Home / Self Care | Payer: No Typology Code available for payment source | Source: Ambulatory Visit | Attending: Cardiology | Admitting: Cardiology

## 2020-02-12 VITALS — Ht 66.0 in | Wt 142.9 lb

## 2020-02-12 DIAGNOSIS — M542 Cervicalgia: Secondary | ICD-10-CM | POA: Diagnosis not present

## 2020-02-12 DIAGNOSIS — I5022 Chronic systolic (congestive) heart failure: Secondary | ICD-10-CM

## 2020-02-15 ENCOUNTER — Encounter (HOSPITAL_COMMUNITY)
Admission: RE | Admit: 2020-02-15 | Discharge: 2020-02-15 | Disposition: A | Discharge status: Home / Self Care | Payer: No Typology Code available for payment source | Source: Ambulatory Visit | Attending: Cardiology | Admitting: Cardiology

## 2020-02-15 ENCOUNTER — Other Ambulatory Visit: Payer: Self-pay

## 2020-02-15 DIAGNOSIS — I5022 Chronic systolic (congestive) heart failure: Secondary | ICD-10-CM

## 2020-02-15 DIAGNOSIS — M542 Cervicalgia: Secondary | ICD-10-CM | POA: Diagnosis not present

## 2020-02-17 ENCOUNTER — Encounter (HOSPITAL_COMMUNITY)
Admission: RE | Admit: 2020-02-17 | Discharge: 2020-02-17 | Disposition: A | Discharge status: Home / Self Care | Payer: No Typology Code available for payment source | Source: Ambulatory Visit | Attending: Cardiology | Admitting: Cardiology

## 2020-02-17 ENCOUNTER — Other Ambulatory Visit: Payer: Self-pay

## 2020-02-17 DIAGNOSIS — I5022 Chronic systolic (congestive) heart failure: Secondary | ICD-10-CM

## 2020-02-17 DIAGNOSIS — M542 Cervicalgia: Secondary | ICD-10-CM | POA: Diagnosis not present

## 2020-02-19 ENCOUNTER — Encounter (HOSPITAL_COMMUNITY)
Admission: RE | Admit: 2020-02-19 | Discharge: 2020-02-19 | Disposition: A | Discharge status: Home / Self Care | Payer: No Typology Code available for payment source | Source: Ambulatory Visit | Attending: Cardiology | Admitting: Cardiology

## 2020-02-19 ENCOUNTER — Other Ambulatory Visit: Payer: Self-pay

## 2020-02-19 DIAGNOSIS — I5022 Chronic systolic (congestive) heart failure: Secondary | ICD-10-CM

## 2020-02-22 ENCOUNTER — Other Ambulatory Visit: Payer: Self-pay

## 2020-02-22 ENCOUNTER — Ambulatory Visit (HOSPITAL_COMMUNITY)
Admission: RE | Admit: 2020-02-22 | Discharge: 2020-02-22 | Disposition: A | Discharge status: Home / Self Care | Payer: No Typology Code available for payment source | Source: Ambulatory Visit | Attending: Cardiology | Admitting: Cardiology

## 2020-02-22 ENCOUNTER — Encounter (HOSPITAL_COMMUNITY): Payer: Self-pay

## 2020-02-22 VITALS — BP 104/70 | HR 83 | Wt 142.2 lb

## 2020-02-22 DIAGNOSIS — I110 Hypertensive heart disease with heart failure: Secondary | ICD-10-CM | POA: Diagnosis not present

## 2020-02-22 DIAGNOSIS — I5022 Chronic systolic (congestive) heart failure: Secondary | ICD-10-CM | POA: Insufficient documentation

## 2020-02-22 DIAGNOSIS — I428 Other cardiomyopathies: Secondary | ICD-10-CM | POA: Diagnosis not present

## 2020-02-22 DIAGNOSIS — Z7901 Long term (current) use of anticoagulants: Secondary | ICD-10-CM | POA: Insufficient documentation

## 2020-02-22 DIAGNOSIS — R42 Dizziness and giddiness: Secondary | ICD-10-CM | POA: Diagnosis present

## 2020-02-22 DIAGNOSIS — Z79899 Other long term (current) drug therapy: Secondary | ICD-10-CM | POA: Insufficient documentation

## 2020-02-22 DIAGNOSIS — E785 Hyperlipidemia, unspecified: Secondary | ICD-10-CM | POA: Insufficient documentation

## 2020-02-22 DIAGNOSIS — E118 Type 2 diabetes mellitus with unspecified complications: Secondary | ICD-10-CM | POA: Diagnosis not present

## 2020-02-22 MED FILL — VICTOZA 18 MG/3 ML INJECT P: 18 | 30 days supply | Qty: 9 | Fill #1

## 2020-02-22 MED FILL — SIMVASTATIN 20 MG TABLET: 20 | 90 days supply | Qty: 90 | Fill #1

## 2020-02-22 NOTE — Patient Instructions (Signed)
It was a pleasure seeing you today!  MEDICATIONS: -No medication changes today -Call if you have questions about your medications.  NEXT APPOINTMENT: Return to clinic in 1 month with Dr. McLean.  In general, to take care of your heart failure: -Limit your fluid intake to 2 Liters (half-gallon) per day.   -Limit your salt intake to ideally 2-3 grams (2000-3000 mg) per day. -Weigh yourself daily and record, and bring that "weight diary" to your next appointment.  (Weight gain of 2-3 pounds in 1 day typically means fluid weight.) -The medications for your heart are to help your heart and help you live longer.   -Please contact us before stopping any of your heart medications.  Call the clinic at [phone] with questions or to reschedule future appointments.  

## 2020-02-22 NOTE — Progress Notes (Signed)
PCP: Dr. Nancy Fetter Cardiology: Dr. Tamala Julian HF Cardiology: Dr. Aundra Dubin  HPI: [name] is a53 y.o.withahistory of chronic systolic heart failure, due to nonischemic cardiomyopathy, HTN, DM,andhyperlipidemia.   Initially diagnosed with NICM in 1998 thought to be from HTN versus viral. Had cath in 1998 that was negative for coronary disease. EF at that time was 20% but EF recovered in 2012.   In October 2019, she hada cough/virus and she took OTC meds. Says she would feel better for a little while but then felt bad again. In 12/19 she had noticed increased fatigue and dyspnea. She presented to [facility] ED on 11/13/18 with increased shortness of breath. She was admitted and echo was completed showing EF had gone back down to 15%. She had RHC/LHC with nonobstructive CAD and relatively preserved cardiac output. She was diuresed in the hospital and discharged. CPX in 2/20 showed only mild HF limitation.   Given family history of cardiomyopathy, MD sent genetic testing. 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 She was referred to Dr. Broadus John, thinking that the variant may be benign and unrelated to her cardiomyopathy. She has no history of conduction disturbance.   Echo in 6/20 showed that EF remains low at 25-30%, mild LV dilation, mildly decreased RV systolic function. She had a [place] placed.   She was last seen by Dr. Aundra Dubin on 12/30/19 and her carvedilol was increased to 25 mg BID. Of note, she cannot take Entresto due to history of angioedema with ACEi. She was last seen by PharmD on 01/18/20. No medication changes were made as BP was well controlled and she reported symptoms of hypotensive episodes during cardiac rehab.   She returns to HF PharmD clinic for follow up. Overall, she reports feeling very well. She graduated from the Cardiac Rehab program on Friday last week. She reports dizziness only when moving too quickly. Denies lightheadedness, fatigue,  chest pain or palpitations. She says her breathing is good and denies shortness of breath. She is able to complete all ADLs and says her activity level has improved greatly with participating in cardiac rehab. Her reported home weight is 139-142 lbs. Last weight in clinic was 142 lbs. Weight today is unchanged. She takes furosemide 20 mg daily. She denies PND/orthopnea. Her appetite has been good.   Shortness of breath/dyspnea on exertion? no  Orthopnea/PND? no  Edema? no  Lightheadedness/dizziness? Dizziness when moving too quickly  Daily weights at home? yes  Blood pressure/heart rate monitoring at home? Will resume home BP monitoring now that she has completed cardiac rehab  Following low-sodium/fluid-restricted diet? yes  HF Medications: Losartan 25 mg BID Carvedilol 25 mg BID Spironolactone 25 mg daily Farxiga 10 mg daily Hydralazine 50 mg TID Isosorbide dinitrate 20 mg TID Digoxin 0.125 mg daily Furosemide 20 mg daily  Has the patient been experiencing any side effects to the medications prescribed?  no  Does the patient have any problems obtaining medications due to transportation or finances?   No - has [employer] and fills with [facility] outpatient pharmacy  Understanding of regimen: good Understanding of indications: good Potential of compliance: good Patient understands to avoid NSAIDs. Patient understands to avoid decongestants.   Pertinent Lab Values (12/30/19):  Serum creatinine 0.90, BUN 21, Potassium 4.2, Sodium 138, Digoxin 0.4 ng/mL  Vital Signs:  Weight: 142 lbs (dry weight: 139-142 lbs)  Blood pressure: 104/70 mmHg  Heart rate: 83 bpm   Assessment: 1. Chronic systolic CHF: Nonischemic cardiomyopathy  by 12/19 cath. Cardiac MRI with LV EF 14%, RV EF 17%. No definite evidence for myocarditis or infiltrative disease by delayed enhancement images. Most likely cause of cardiomyopathy is familial versus prior viral myocarditis.  Brother also had a cardiomyopathy of uncertain etiology. Genetic testing was done, showing an LMNA gene variant of uncertain significance =>she saw Dr. Broadus John, suspect benign/uninvolved variant (no conduction abnormality). CPX in 2/20 showed only mild HF limitation. Echo in 6/20 showed EF 25-30%. She now has a Chemical engineer ICD.  -NYHA class II symptoms, euvolemic on exam and weight stable.BP 104/70 in clinic today. - Continue losartan 25 mg BID. She cannot take Entresto with history of ACEI angioedema. - Continue carvedilol 25 mg BID - Continue spironolactone 25 mg daily - Continue dapagliflozin 10 mg daily - Continue hydralazine 50 mg TID - Continue isosorbide dinitrate 20 mg TID - Continue digoxin 0.125 mg daily - Continue furosemide 20 mg daily - Narrow QRS, not CRT candidate.  - Basic disease state pathophysiology, medication indication, mechanism and side effects reviewed at length with patient and he verbalized understanding  2. Type II diabetes - Continue dapagliflozin and Victoza  Plan: 1) Medication changes: Based on clinical presentation, vital signs and recent labs will continue with current therapy - no medication changes today given lower BP.  2) Labs: none 3) Follow-up: 03/29/20 with Dr. Johny Shock, PharmD, BCPS Clinical Pharmacist Phone: [phone]  Audry Riles, PharmD, BCPS, [facility] CPP Heart Failure Clinic Pharmacist [phone]
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
## 2020-03-04 MED FILL — LOSARTAN POTASSIUM 25 MG TA: 25 | 30 days supply | Qty: 60 | Fill #4

## 2020-03-15 MED FILL — metFORMIN HCL 1000 MG TABS: 1000 | 90 days supply | Qty: 180 | Fill #1

## 2020-03-15 MED FILL — DIGOXIN 0.125 MG TABLET: 125 | 90 days supply | Qty: 90 | Fill #1

## 2020-03-15 MED FILL — FUROSEMIDE 20 MG TABS: 20 | 30 days supply | Qty: 30 | Fill #5

## 2020-03-24 MED FILL — ISOSORBIDE DN 20 MG TABLET: 20 | 30 days supply | Qty: 90 | Fill #2

## 2020-03-24 MED FILL — SPIRONOLACTONE 25 MG TABS: 25 | 90 days supply | Qty: 90 | Fill #3

## 2020-03-29 ENCOUNTER — Ambulatory Visit (HOSPITAL_COMMUNITY)
Admission: RE | Admit: 2020-03-29 | Discharge: 2020-03-29 | Disposition: A | Discharge status: Home / Self Care | Payer: No Typology Code available for payment source | Source: Ambulatory Visit | Attending: Cardiology | Admitting: Cardiology

## 2020-03-29 ENCOUNTER — Other Ambulatory Visit: Payer: Self-pay

## 2020-03-29 VITALS — BP 92/54 | HR 79 | Wt 143.4 lb

## 2020-03-29 DIAGNOSIS — Z95810 Presence of automatic (implantable) cardiac defibrillator: Secondary | ICD-10-CM | POA: Insufficient documentation

## 2020-03-29 DIAGNOSIS — Z833 Family history of diabetes mellitus: Secondary | ICD-10-CM | POA: Insufficient documentation

## 2020-03-29 DIAGNOSIS — I110 Hypertensive heart disease with heart failure: Secondary | ICD-10-CM | POA: Diagnosis not present

## 2020-03-29 DIAGNOSIS — I5022 Chronic systolic (congestive) heart failure: Secondary | ICD-10-CM | POA: Diagnosis present

## 2020-03-29 DIAGNOSIS — Z7984 Long term (current) use of oral hypoglycemic drugs: Secondary | ICD-10-CM | POA: Insufficient documentation

## 2020-03-29 DIAGNOSIS — F329 Major depressive disorder, single episode, unspecified: Secondary | ICD-10-CM | POA: Insufficient documentation

## 2020-03-29 DIAGNOSIS — Z79899 Other long term (current) drug therapy: Secondary | ICD-10-CM | POA: Diagnosis not present

## 2020-03-29 DIAGNOSIS — Z8249 Family history of ischemic heart disease and other diseases of the circulatory system: Secondary | ICD-10-CM | POA: Diagnosis not present

## 2020-03-29 DIAGNOSIS — I428 Other cardiomyopathies: Secondary | ICD-10-CM | POA: Diagnosis not present

## 2020-03-29 DIAGNOSIS — Z7982 Long term (current) use of aspirin: Secondary | ICD-10-CM | POA: Diagnosis not present

## 2020-03-29 DIAGNOSIS — E119 Type 2 diabetes mellitus without complications: Secondary | ICD-10-CM | POA: Diagnosis not present

## 2020-03-29 DIAGNOSIS — E785 Hyperlipidemia, unspecified: Secondary | ICD-10-CM | POA: Insufficient documentation

## 2020-03-29 LAB — BASIC METABOLIC PANEL
Anion gap: 13 (ref 5–15)
BUN: 20 mg/dL (ref 8–23)
CO2: 25 mmol/L (ref 22–32)
Calcium: 10 mg/dL (ref 8.9–10.3)
Chloride: 97 mmol/L — ABNORMAL LOW (ref 98–111)
Creatinine, Ser: 0.81 mg/dL (ref 0.44–1.00)
GFR calc Af Amer: >60 mL/min (ref >60)
GFR calc non Af Amer: >60 mL/min (ref >60)
Glucose, Bld: 311 mg/dL — ABNORMAL HIGH (ref 70–99)
Potassium: 4.8 mmol/L (ref 3.5–5.1)
Sodium: 135 mmol/L (ref 135–145)

## 2020-03-29 LAB — DIGOXIN LEVEL: Digoxin Level: 0.8 ng/mL (ref 0.8–2.0)

## 2020-03-29 NOTE — Progress Notes (Signed)
PCP: Dr. Nancy Fetter Cardiology: Dr. Tamala Julian HF Cardiology: Dr. Aundra Dubin  Toni [name] is a 64 y.o. [employer] employee with a history of chronic systolic heart failure, due to nonischemic cardiomyopathy, HTN, DM, and hyperlipidemia.   Initially diagnosed with NICM in 1998 thought to be from HTN versus viral. Had cath in 1998 that was negative for coronary disease. EF at that time was 20% but EF recovered in 2012.   In October 2019, she had a cough/virus and she took OTC meds. Says she would feel better for a little while but then felt bad again. 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  She presented to [facility] ED on 11/13/18 with increased shortness of breath. She was admitted and echo was completed showing EF had gone back down to 15%. She had RHC/LHC with nonobstructive CAD and relatively preserved cardiac output. She was diuresed in the hospital and discharged. CPX in 2/20 showed only mild HF limitation.   Given family history of cardiomyopathy, I sent genetic testing.  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  I had her see Dr. Broadus John, we think that the variant may be benign and unrelated to her cardiomyopathy.  She has no history of conduction disturbance.   Echo in 6/20 showed that EF remains low at 25-30%, mild LV dilation, mildly decreased RV systolic function.  She had a [place] placed.   She returns today for followup of CHF.  Weight is stable.  Still some pain at ICD site, otherwise no chest pain.  No significant dyspnea but she is tired by the end of the day. No orthopnea/PND.  Occasional mild lightheadedness if she stands too fast.  No syncope.    [employer] device interrogation: Heartlogic score 0, no VT, no V-pacing.    Labs (1/20): K 4, creatinine 0.67 => 0.74 Labs (3/20): K 4.1, creatinine 0.87 Labs (6/20): digoxin level 0.9 Labs (7/20):  K 4.1, creatinine 0.74 Labs (8/20): digoxin 0.4 Labs (11/20): K 4.5, creatinine 0.65, digoxin 1.2 Labs (2/21): digoxin 0.4, K 4.2, creatinine 0.9  PMH: 1. HTN 2. Type 2 diabetes 3. Hyperlipidemia 4. Chronic systolic CHF: Nonischemic cardiomyopathy.  Diagnosed in 1998, EF 20% by echo at that time.  Echo back to normal range by 2012.   - LHC/RHC (12/19): D1 60-70% stenosis; mean RA 6, PA 58/22, mean PCWP 22, CI 2.9.  - Echo (12/19): EF 15% with severe LV dilation, moderate central MR likely functional.  - Cardiac MRI (12/19): Moderate LV dilation with EF 14%, mild RV dilation with EF 17%, LGE at the inferior RV insertion site (nonspecific).  - CPX (2/20): peak VO2 18.6, VE/VCO2 31, RER 1.18 => mild HF limitation.  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  Suspect the variant is benign.  - Echo (6/20): EF 25-30%, mild LV dilation, mildly decreased RV systolic function.  5. Angioedema with ACEI 6. Left shoulder adhesive capsulitis 7. Depression  Social History   Socioeconomic History  . Marital status: Single    Spouse name: Not on file  . Number of children: Not on file  . Years of education: 47  . Highest education level: Bachelor's degree (e.g., BA, AB, BS)  Occupational History  . Occupation: Surveyor, quantity: [employer]  Tobacco Use  . Smoking status: Never Smoker  .  Smokeless tobacco: Never Used  Substance and Sexual Activity  . Alcohol use: Yes    Alcohol/week: 0.0 standard drinks    Comment: less than once a month  . Drug use: No  . Sexual activity: Never  Other Topics Concern  . Not on file  Social History Narrative   Works at [employer].  Lives alone.     Social Determinants of Health   Financial Resource Strain: Low Risk   . Difficulty of Paying Living Expenses: Not very hard  Food Insecurity:   . Worried About Charity fundraiser in the Last Year:   . Arboriculturist in the Last Year:    Transportation Needs: No Transportation Needs  . Lack of Transportation (Medical): No  . Lack of Transportation (Non-Medical): No  Physical Activity: Inactive  . Days of Exercise per Week: 0 days  . Minutes of Exercise per Session: 0 min  Stress: Stress Concern Present  . Feeling of Stress : Very much  Social Connections:   . Frequency of Communication with Friends and Family:   . Frequency of Social Gatherings with Friends and Family:   . Attends Religious Services:   . Active Member of Clubs or Organizations:   . Attends Archivist Meetings:   Marland Kitchen Marital Status:   Intimate Partner Violence:   . Fear of Current or Ex-Partner:   . Emotionally Abused:   Marland Kitchen Physically Abused:   . Sexually Abused:    Family History  Problem Relation Age of Onset  . Diabetes Mellitus I Mother   . Lung cancer Mother   . Diabetes Mellitus I Father   . Sudden death Father 24  . Hypertension Brother   . Hypertension Brother   . Diabetes Mellitus I Brother   . Benign prostatic hyperplasia Brother   . Heart failure Paternal Uncle    ROS: All systems reviewed and negative except as per HPI.   Current Outpatient Medications  Medication Sig Dispense Refill  . aspirin 81 MG chewable tablet Chew 1 tablet (81 mg total) by mouth daily. 30 tablet 0  . carvedilol (COREG) 25 MG tablet Take 1 tablet (25 mg total) by mouth 2 (two) times daily with a meal. 60 tablet 5  . dapagliflozin propanediol (FARXIGA) 10 MG TABS tablet Take 10 mg by mouth daily.    . digoxin (LANOXIN) 0.125 MG tablet TAKE 1 TABLET BY MOUTH DAILY. 90 tablet 1  . furosemide (LASIX) 20 MG tablet TAKE 1 TABLET (20 MG TOTAL) BY MOUTH DAILY. 30 tablet 5  . glipiZIDE (GLUCOTROL XL) 10 MG 24 hr tablet Take 10 mg by mouth 2 (two) times daily with a meal.     . hydrALAZINE (APRESOLINE) 50 MG tablet Take 1 tablet (50 mg total) by mouth 3 (three) times daily. 270 tablet 1  . isosorbide dinitrate (ISORDIL) 20 MG tablet TAKE 1 TABLET (20 MG  TOTAL) BY MOUTH 3 TIMES DAILY. 90 tablet 3  . LINZESS 145 MCG CAPS capsule Take 145 mcg by mouth daily.    Marland Kitchen liraglutide (VICTOZA) 18 MG/3ML SOPN Inject 1.8 mg into the skin every evening.    Marland Kitchen losartan (COZAAR) 25 MG tablet TAKE 1 TABLET BY MOUTH 2 TIMES A DAY. 60 tablet 5  . metFORMIN (GLUCOPHAGE) 1000 MG tablet Take 1,000 mg by mouth 2 (two) times daily with a meal.    . Multiple Vitamin (MULTIVITAMIN WITH MINERALS) TABS tablet Take 1 tablet by mouth daily.    . sertraline (ZOLOFT)  50 MG tablet Take 50 mg by mouth daily.    . simvastatin (ZOCOR) 20 MG tablet Take 20 mg by mouth every evening.    Marland Kitchen spironolactone (ALDACTONE) 25 MG tablet TAKE 1 TABLET BY MOUTH EVERY EVENING. 90 tablet 3  . TRUE METRIX BLOOD GLUCOSE TEST test strip Use as directed.  5  . TRUEPLUS LANCETS 30G MISC Use as directed.  5  . UNIFINE PENTIPS 32G X 4 MM MISC      No current facility-administered medications for this encounter.   BP (!) 92/54   Pulse 79   Wt 65 kg (143 lb 6.4 oz)   SpO2 95%   BMI 23.15 kg/m  General: NAD Neck: No JVD, no thyromegaly or thyroid nodule.  Lungs: Clear to auscultation bilaterally with normal respiratory effort. CV: Nondisplaced PMI.  Heart regular S1/S2, no S3/S4, no murmur.  No peripheral edema.  No carotid bruit.  Normal pedal pulses.  Abdomen: Soft, nontender, no hepatosplenomegaly, no distention.  Skin: Intact without lesions or rashes.  Neurologic: Alert and oriented x 3.  Psych: Normal affect. Extremities: No clubbing or cyanosis.  HEENT: Normal.   Assessment/Plan: 1. Chronic systolic CHF: Nonischemic cardiomyopathy by 12/19 cath.  Cardiac MRI with LV EF 14%, RV EF 17%. No definite evidence for myocarditis or infiltrative disease by delayed enhancement images.  Most likely cause of cardiomyopathy is familial versus prior viral myocarditis.  Brother also had a cardiomyopathy of uncertain etiology.  Genetic testing was done, showing an LMNA gene variant of uncertain  significance => she saw Dr. Broadus John, suspect benign/uninvolved variant (no conduction abnormality).  CPX in 2/20 showed only mild HF limitation.  Echo in 6/20 showed EF 25-30%. She now has a [place].  On exam and by Heartlogic, she is not volume overloaded.  NYHA class II symptoms.  Narrow QRS, not CRT candidate.  With SBP in 90s, will not titrate meds today.  - Continue Coreg 25 mg bid.   - Continue losartan 25 mg bid.  She cannot take Entresto with history of ACEI angioedema. BMET today.  - Continue dapagliflozin.  - Continue Lasix 20 mg daily. BMET today.  - Continue spironolactone 25 mg daily.  - Continue digoxin, check level today.  - Continue hydralazine 50 mg tid and isordil 20 mg tid.  2. Type II diabetes: She is on dapagliflozin.   Followup in 4 months.   Loralie Champagne 03/29/2020

## 2020-03-29 NOTE — Patient Instructions (Signed)
Labs done today. We will contact you only if your labs are abnormal.  No medication changes were made. Please continue all medications as prescribed.  Your physician recommends that you schedule a follow-up appointment in: 4 months  At the [facility], you and your health needs are our priority. As part of our continuing mission to provide you with exceptional heart care, we have created designated Provider Care Teams. These Care Teams include your primary Cardiologist (physician) and Advanced Practice Providers (APPs- Physician Assistants and Nurse Practitioners) who all work together to provide you with the care you need, when you need it.   You may see any of the following providers on your designated Care Team at your next follow up: Marland Kitchen Dr Glori Bickers . Dr Loralie Champagne . Darrick Grinder, NP . Lyda Jester, PA . Audry Riles, PharmD   Please be sure to bring in all your medications bottles to every appointment.

## 2020-03-30 ENCOUNTER — Ambulatory Visit (INDEPENDENT_AMBULATORY_CARE_PROVIDER_SITE_OTHER): Payer: No Typology Code available for payment source | Admitting: *Deleted

## 2020-03-30 DIAGNOSIS — I428 Other cardiomyopathies: Secondary | ICD-10-CM | POA: Diagnosis not present

## 2020-03-30 LAB — CUP PACEART REMOTE DEVICE CHECK
Battery Remaining Longevity: 180 mo
Battery Remaining Percentage: 100 %
Brady Statistic RV Percent Paced: 0 %
Date Time Interrogation Session: 20210511090500
HighPow Impedance: 71 Ohm
Implantable Lead Implant Date: 20200730
Implantable Lead Location: 753860
Implantable Lead Model: 292
Implantable Lead Serial Number: 447014
Implantable Pulse Generator Implant Date: 20200730
Lead Channel Impedance Value: 494 Ohm
Lead Channel Pacing Threshold Amplitude: 1.4 V
Lead Channel Pacing Threshold Pulse Width: 0.4 ms
Lead Channel Setting Pacing Amplitude: 3.5 V
Lead Channel Setting Pacing Pulse Width: 0.4 ms
Lead Channel Setting Sensing Sensitivity: 0.5 mV
Pulse Gen Serial Number: 266091

## 2020-03-31 NOTE — Progress Notes (Signed)
Remote ICD transmission.   

## 2020-04-07 MED FILL — LOSARTAN POTASSIUM 25 MG TA: 25 | 30 days supply | Qty: 60 | Fill #5

## 2020-04-14 ENCOUNTER — Other Ambulatory Visit (HOSPITAL_COMMUNITY): Payer: Self-pay | Admitting: Cardiology

## 2020-04-14 MED FILL — FARXIGA 10 MG TABLET: 10 | 90 days supply | Qty: 90 | Fill #1

## 2020-04-15 MED FILL — FUROSEMIDE 20 MG TABS: 20 | 30 days supply | Qty: 30 | Fill #0

## 2020-04-20 ENCOUNTER — Other Ambulatory Visit (HOSPITAL_COMMUNITY): Payer: Self-pay | Admitting: Cardiology

## 2020-04-20 MED FILL — CARVEDILOL 25 MG TABLET: 25 | 90 days supply | Qty: 180 | Fill #1

## 2020-04-20 MED FILL — SERTRALINE HCL 50 MG TABLET: 50 | 90 days supply | Qty: 90 | Fill #0

## 2020-04-21 MED FILL — hydrALAZINE HCL 50 MG TABS: 50 | 90 days supply | Qty: 270 | Fill #0

## 2020-05-02 MED FILL — VICTOZA 18 MG/3 ML INJECT P: 18 | 30 days supply | Qty: 9 | Fill #3

## 2020-05-03 MED FILL — glipiZIDE ER 10 MG TB24: 10 | 90 days supply | Qty: 180 | Fill #0

## 2020-05-04 ENCOUNTER — Encounter (HOSPITAL_COMMUNITY): Payer: Self-pay | Admitting: *Deleted

## 2020-05-04 ENCOUNTER — Emergency Department (HOSPITAL_COMMUNITY): Payer: No Typology Code available for payment source

## 2020-05-04 ENCOUNTER — Emergency Department (HOSPITAL_COMMUNITY)
Admission: EM | Admit: 2020-05-04 | Discharge: 2020-05-04 | Disposition: A | Discharge status: Home / Self Care | Payer: No Typology Code available for payment source | Attending: Emergency Medicine | Admitting: Emergency Medicine

## 2020-05-04 ENCOUNTER — Other Ambulatory Visit: Payer: Self-pay

## 2020-05-04 DIAGNOSIS — Z79899 Other long term (current) drug therapy: Secondary | ICD-10-CM | POA: Diagnosis not present

## 2020-05-04 DIAGNOSIS — I110 Hypertensive heart disease with heart failure: Secondary | ICD-10-CM | POA: Insufficient documentation

## 2020-05-04 DIAGNOSIS — R55 Syncope and collapse: Secondary | ICD-10-CM | POA: Insufficient documentation

## 2020-05-04 DIAGNOSIS — E869 Volume depletion, unspecified: Secondary | ICD-10-CM | POA: Diagnosis not present

## 2020-05-04 DIAGNOSIS — Z95810 Presence of automatic (implantable) cardiac defibrillator: Secondary | ICD-10-CM | POA: Insufficient documentation

## 2020-05-04 DIAGNOSIS — E1165 Type 2 diabetes mellitus with hyperglycemia: Secondary | ICD-10-CM | POA: Insufficient documentation

## 2020-05-04 DIAGNOSIS — Z7982 Long term (current) use of aspirin: Secondary | ICD-10-CM | POA: Insufficient documentation

## 2020-05-04 DIAGNOSIS — Z7984 Long term (current) use of oral hypoglycemic drugs: Secondary | ICD-10-CM | POA: Diagnosis not present

## 2020-05-04 DIAGNOSIS — R739 Hyperglycemia, unspecified: Secondary | ICD-10-CM

## 2020-05-04 DIAGNOSIS — I5022 Chronic systolic (congestive) heart failure: Secondary | ICD-10-CM | POA: Diagnosis not present

## 2020-05-04 LAB — URINALYSIS, ROUTINE W REFLEX MICROSCOPIC
Bilirubin Urine: NEGATIVE
Glucose, UA: >=500 mg/dL — AB
Hgb urine dipstick: NEGATIVE
Ketones, ur: NEGATIVE mg/dL
Nitrite: NEGATIVE
Protein, ur: NEGATIVE mg/dL
Specific Gravity, Urine: 1.013 (ref 1.005–1.030)
pH: 5 (ref 5.0–8.0)

## 2020-05-04 LAB — BASIC METABOLIC PANEL
Anion gap: 14 (ref 5–15)
BUN: 19 mg/dL (ref 8–23)
CO2: 22 mmol/L (ref 22–32)
Calcium: 9.5 mg/dL (ref 8.9–10.3)
Chloride: 100 mmol/L (ref 98–111)
Creatinine, Ser: 0.83 mg/dL (ref 0.44–1.00)
GFR calc Af Amer: >60 mL/min (ref >60)
GFR calc non Af Amer: >60 mL/min (ref >60)
Glucose, Bld: 319 mg/dL — ABNORMAL HIGH (ref 70–99)
Potassium: 5.1 mmol/L (ref 3.5–5.1)
Sodium: 136 mmol/L (ref 135–145)

## 2020-05-04 LAB — CBC
HCT: 45 % (ref 36.0–46.0)
Hemoglobin: 14 g/dL (ref 12.0–15.0)
MCH: 29.3 pg (ref 26.0–34.0)
MCHC: 31.1 g/dL (ref 30.0–36.0)
MCV: 94.1 fL (ref 80.0–100.0)
Platelets: 280 10*3/uL (ref 150–400)
RBC: 4.78 MIL/uL (ref 3.87–5.11)
RDW: 12.6 % (ref 11.5–15.5)
WBC: 9.7 10*3/uL (ref 4.0–10.5)
nRBC: 0 % (ref 0.0–0.2)

## 2020-05-04 LAB — TROPONIN I (HIGH SENSITIVITY)
Troponin I (High Sensitivity): 3 ng/L (ref <18)
Troponin I (High Sensitivity): 3 ng/L (ref <18)

## 2020-05-04 LAB — BRAIN NATRIURETIC PEPTIDE: B Natriuretic Peptide: 22.7 pg/mL (ref 0.0–100.0)

## 2020-05-04 LAB — DIGOXIN LEVEL: Digoxin Level: 0.9 ng/mL (ref 0.8–2.0)

## 2020-05-04 LAB — CBG MONITORING, ED: Glucose-Capillary: 283 mg/dL — ABNORMAL HIGH (ref 70–99)

## 2020-05-04 MED ORDER — SODIUM CHLORIDE 0.9 % IV BOLUS: 250.0000 mL | Freq: Once | INTRAVENOUS | Status: DC

## 2020-05-04 MED ORDER — SODIUM CHLORIDE 0.9% FLUSH: 3.0000 mL | Freq: Once | INTRAVENOUS | Status: DC

## 2020-05-04 MED FILL — ISOSORBIDE DN 20 MG TABLET: 20 | 30 days supply | Qty: 90 | Fill #3

## 2020-05-04 NOTE — ED Triage Notes (Signed)
Pt was at a store and started feeling dizzy and had 2 syncopal episodes per bystanders and did hit back of head on counter.  BP 84/52 initially, CBG- 375 and takes Glipizide. Last BP 130/72, HR 78. 18 Left AC and has had 150cc NS en route to ED.

## 2020-05-04 NOTE — ED Provider Notes (Signed)
[facility] EMERGENCY DEPARTMENT Provider Note   CSN: [id number] Arrival date & time: 05/04/20  5400     History Chief Complaint  Patient presents with  . Loss of Consciousness    Toni [name] is a 64 y.o. female with history of CHF (LVEF EF 25 to 30%), status post ICD, diabetes mellitus, hypertension, hyperlipidemia, CAD presents for evaluation of acute onset, resolved lightheadedness with 2 episodes of syncope.   She reports that just prior to arrival she stepped out of her vehicle and became suddenly lightheaded.  She reports that the symptoms persisted so she leaned against a brick wall but experienced a brief episode of loss of consciousness.  She regained consciousness on the ground.  She states that she began to feel a little bit better so she stood up, began shopping inside the [employer] where she was and was leaning against a shopping cart where within a few minutes she began to feel recurrent lightheadedness.  The symptoms worsened so she walked up to the checkout desk where she had another episode of loss of consciousness.  She believes that both episodes were very brief.  She was told that she did hit her head the second time but does not recall this.  She denies headache, vision changes, neck pain, back pain, numbness or weakness of the extremities.  She denies any leg swelling, chest pain, shortness of breath, abdominal pain, nausea, or vomiting.  She does not feel that her defibrillator went off.  She reports compliance with her medications.  She is a non-smoker, denies recreational drug use or excessive alcohol intake.  She sees Dr. Tamala Julian and Dr. Aundra Dubin with cardiology.    The history is provided by the patient.       Past Medical History:  Diagnosis Date  . CHF (congestive heart failure) ([place])   . Diabetes mellitus without complication ([place])   . Hypertension     Patient Active Problem List   Diagnosis Date Noted  . ICD (implantable  cardioverter-defibrillator) in place 09/18/2019  . Nonischemic cardiomyopathy ([place]) 06/04/2019  . Hypomagnesemia 11/18/2018  . Hypokalemia 11/15/2018  . Diabetes mellitus without complication ([place])   . Coronary artery disease   . Acute on chronic systolic CHF (congestive heart failure) ([place]) 01/04/2016  . Hyperlipidemia 01/03/2016  . Cardiomegaly - hypertensive 01/03/2016  . DM (diabetes mellitus), type 2 with complications ([place]) 86/76/1951  . Hypertension 01/03/2014    Past Surgical History:  Procedure Laterality Date  . ABDOMINAL AORTIC ANEURYSM REPAIR    . ABDOMINAL HYSTERECTOMY    . CARDIAC CATHETERIZATION     in [facility], clean, per pt.  . ICD IMPLANT N/A 06/18/2019   Procedure: ICD IMPLANT;  Surgeon: Evans Lance, MD;  Location: [facility];  Service: Cardiovascular;  Laterality: N/A;  . RIGHT/LEFT HEART CATH AND CORONARY ANGIOGRAPHY N/A 11/17/2018   Procedure: RIGHT/LEFT HEART CATH AND CORONARY ANGIOGRAPHY;  Surgeon: Belva Crome, MD;  Location: [facility];  Service: Cardiovascular;  Laterality: N/A;     OB History   No obstetric history on file.     Family History  Problem Relation Age of Onset  . Diabetes Mellitus I Mother   . Lung cancer Mother   . Diabetes Mellitus I Father   . Sudden death Father 25  . Hypertension Brother   . Hypertension Brother   . Diabetes Mellitus I Brother   . Benign prostatic hyperplasia Brother   . Heart failure Paternal  Uncle     Social History   Tobacco Use  . Smoking status: Never Smoker  . Smokeless tobacco: Never Used  Vaping Use  . Vaping Use: Never used  Substance Use Topics  . Alcohol use: Yes    Alcohol/week: 0.0 standard drinks    Comment: less than once a month  . Drug use: No    Home Medications Prior to Admission medications   Medication Sig Start Date End Date Taking? Authorizing Provider  aspirin 81 MG chewable tablet Chew 1 tablet (81 mg total) by mouth daily. 11/20/18  Yes Dhungel,  Nishant, MD  carvedilol (COREG) 25 MG tablet Take 1 tablet (25 mg total) by mouth 2 (two) times daily with a meal. 12/30/19  Yes Larey Dresser, MD  cetirizine (ZYRTEC) 10 MG tablet Take 10 mg by mouth daily.   Yes [provider]  dapagliflozin propanediol (FARXIGA) 10 MG TABS tablet Take 10 mg by mouth daily.   Yes [provider]  digoxin (LANOXIN) 0.125 MG tablet TAKE 1 TABLET BY MOUTH DAILY. Patient taking differently: Take 0.125 mg by mouth daily.  12/09/19  Yes Clegg, Amy D, NP  furosemide (LASIX) 20 MG tablet TAKE 1 TABLET (20 MG TOTAL) BY MOUTH DAILY. 04/15/20  Yes Larey Dresser, MD  glipiZIDE (GLUCOTROL XL) 10 MG 24 hr tablet Take 10 mg by mouth 2 (two) times daily with a meal.    Yes [provider]  hydrALAZINE (APRESOLINE) 50 MG tablet TAKE 1 TABLET (50 MG TOTAL) BY MOUTH 3 (THREE) TIMES DAILY. 04/21/20  Yes Larey Dresser, MD  isosorbide dinitrate (ISORDIL) 20 MG tablet TAKE 1 TABLET (20 MG TOTAL) BY MOUTH 3 TIMES DAILY. Patient taking differently: Take 20 mg by mouth 3 (three) times daily.  01/05/20  Yes Larey Dresser, MD  LINZESS 145 MCG CAPS capsule Take 145 mcg by mouth daily. 04/06/19  Yes [provider]  liraglutide (VICTOZA) 18 MG/3ML SOPN Inject 1.8 mg into the skin every evening.   Yes [provider]  losartan (COZAAR) 25 MG tablet TAKE 1 TABLET BY MOUTH 2 TIMES A DAY. Patient taking differently: Take 25 mg by mouth daily.  10/19/19  Yes Larey Dresser, MD  metFORMIN (GLUCOPHAGE) 1000 MG tablet Take 1,000 mg by mouth 2 (two) times daily with a meal.   Yes [provider]  Multiple Vitamin (MULTIVITAMIN WITH MINERALS) TABS tablet Take 1 tablet by mouth daily.   Yes [provider]  sertraline (ZOLOFT) 50 MG tablet Take 50 mg by mouth daily.   Yes [provider]  simvastatin (ZOCOR) 20 MG tablet Take 20 mg by mouth every evening.   Yes [provider]  spironolactone (ALDACTONE) 25 MG  tablet TAKE 1 TABLET BY MOUTH EVERY EVENING. Patient taking differently: Take 25 mg by mouth every evening.  06/29/19  Yes Larey Dresser, MD  TRUE METRIX BLOOD GLUCOSE TEST test strip 1 each by Other route as directed.  12/05/15  Yes [provider]  TRUEPLUS LANCETS 30G MISC 1 each by Other route as directed. Use as directed. 12/05/15  Yes [provider]  UNIFINE PENTIPS 32G X 4 MM MISC 1 each by Other route as directed.  03/06/19  Yes [provider]    Allergies    Biaxin [clarithromycin], Enalapril maleate, and Vasotec [enalapril]  Review of Systems   Review of Systems  Constitutional: Negative for chills and fever.  Eyes: Negative for photophobia.  Respiratory: Negative for shortness  of breath.   Cardiovascular: Negative for chest pain.  Gastrointestinal: Negative for abdominal pain, nausea and vomiting.  Musculoskeletal: Negative for neck pain.  Neurological: Positive for syncope and light-headedness. Negative for weakness, numbness and headaches.  All other systems reviewed and are negative.   Physical Exam Updated Vital Signs BP 101/65   Pulse 78   Temp 98.4 F (36.9 C) (Oral)   Resp 18   SpO2 97%   Physical Exam Vitals and nursing note reviewed.  Constitutional:      General: She is not in acute distress.    Appearance: She is well-developed.  HENT:     Head: Normocephalic and atraumatic.     Comments: No Battle's signs, no raccoon's eyes, no rhinorrhea. No hemotympanum. No tenderness to palpation of the face or skull. No deformity, crepitus, or swelling noted.  Eyes:     General:        Right eye: No discharge.        Left eye: No discharge.     Conjunctiva/sclera: Conjunctivae normal.  Neck:     Vascular: No JVD.     Trachea: No tracheal deviation.     Comments: No midline spine TTP, no paraspinal muscle tenderness, no deformity, crepitus, or step-off noted  Cardiovascular:     Rate and Rhythm: Normal rate and regular rhythm.    Pulmonary:     Effort: Pulmonary effort is normal.     Breath sounds: Normal breath sounds.  Abdominal:     General: Bowel sounds are normal. There is no distension.     Palpations: Abdomen is soft.     Tenderness: There is no abdominal tenderness. There is no guarding or rebound.  Musculoskeletal:        General: No swelling or tenderness.     Cervical back: Normal range of motion and neck supple.  Skin:    General: Skin is warm and dry.     Findings: No erythema.  Neurological:     Mental Status: She is alert.     Comments: Mental Status:  Alert, thought content appropriate, able to give a coherent history. Speech fluent without evidence of aphasia. Able to follow 2 step commands without difficulty.  Cranial Nerves:  II:  Peripheral visual fields grossly normal, pupils equal, round, reactive to light III,IV, VI: ptosis not present, extra-ocular motions intact bilaterally  V,VII: smile symmetric, facial light touch sensation equal VIII: hearing grossly normal to voice  X: uvula elevates symmetrically  XI: bilateral shoulder shrug symmetric and strong XII: midline tongue extension without fassiculations Motor:  Normal tone. 5/5 strength of BUE and BLE major muscle groups including strong and equal grip strength and dorsiflexion/plantar flexion Sensory: light touch normal in all extremities.    Psychiatric:        Behavior: Behavior normal.     ED Results / Procedures / Treatments   Labs (all labs ordered are listed, but only abnormal results are displayed) Labs Reviewed  BASIC METABOLIC PANEL - Abnormal; Notable for the following components:      Result Value   Glucose, Bld 319 (*)    All other components within normal limits  URINALYSIS, ROUTINE W REFLEX MICROSCOPIC - Abnormal; Notable for the following components:   Color, Urine STRAW (*)    Glucose, UA >=500 (*)    Leukocytes,Ua SMALL (*)    Bacteria, UA RARE (*)    All other components within normal limits  CBG  MONITORING, ED - Abnormal; Notable for the following  components:   Glucose-Capillary 283 (*)    All other components within normal limits  CBC  BRAIN NATRIURETIC PEPTIDE  DIGOXIN LEVEL  TROPONIN I (HIGH SENSITIVITY)  TROPONIN I (HIGH SENSITIVITY)    EKG EKG Interpretation  Date/Time:  Wednesday May 04 2020 10:23:47 EDT Ventricular Rate:  76 PR Interval:    QRS Duration: 88 QT Interval:  417 QTC Calculation: 469 R Axis:   -33 Text Interpretation: Sinus rhythm Left axis deviation Consider anterior infarct Confirmed by Fredia Sorrow [phone]) on 05/04/2020 10:26:10 AM   Radiology DG Chest 2 View  Result Date: 05/04/2020 CLINICAL DATA:  Syncope 3 times today. EXAM: CHEST - 2 VIEW COMPARISON:  06/18/2019 FINDINGS: The heart size and pulmonary vascularity are normal and the lungs are clear. No effusions. AICD in place. No bone abnormality. IMPRESSION: No active cardiopulmonary disease. Electronically Signed   By: Lorriane Shire M.D.   On: 05/04/2020 11:25   CT Head Wo Contrast  Result Date: 05/04/2020 CLINICAL DATA:  Recurrent syncopal episodes. EXAM: CT HEAD WITHOUT CONTRAST TECHNIQUE: Contiguous axial images were obtained from the base of the skull through the vertex without intravenous contrast. COMPARISON:  None. FINDINGS: Brain: There is no evidence of acute intracranial hemorrhage, mass lesion, brain edema or extra-axial fluid collection. The ventricles and subarachnoid spaces are appropriately sized for age. There is no CT evidence of acute cortical infarction. Vascular: Intracranial vascular calcifications. No hyperdense vessel identified. Skull: Negative for fracture or focal lesion. Sinuses/Orbits: The visualized paranasal sinuses and mastoid air cells are clear. No orbital abnormalities are seen. Other: None. IMPRESSION: Normal noncontrast head CT. Electronically Signed   By: Richardean Sale M.D.   On: 05/04/2020 12:45    Procedures Procedures (including critical care  time)  Medications Ordered in ED Medications  sodium chloride flush (NS) 0.9 % injection 3 mL (has no administration in time range)    ED Course  I have reviewed the triage vital signs and the nursing notes.  Pertinent labs & imaging results that were available during my care of the patient were reviewed by me and considered in my medical decision making (see chart for details).    MDM Rules/Calculators/A&P                          Patient presenting for evaluation after 2 syncopal episodes this morning.  She is afebrile, blood pressure is now at patient's baseline (she states close to 110/70) after small IV fluid bolus with EMS.  She is nontoxic in appearance.  She is neurovascularly intact with no focal neurologic deficits.  She has a history of ischemic cardiomyopathy with EF of 25 to 30% and has a defibrillator but does not believe that it fired prior to her syncope.  She has a significant cardiac history and is currently on multiple medications for this including digoxin.  We will obtain an EKG, digoxin level, basic labs, head CT to rule out intracranial hemorrhage or other intracranial abnormality, chest x-ray.   Lab work reviewed and interpreted by myself significant for hyperglycemia but she is not in DKA.  She does tell me that she has been deviating from her usual diet while caring for a recently widowed friend "who has a lot of snacks".  UA does not suggest UTI or nephrolithiasis.  Digoxin level is therapeutic.  Her BNP is within normal limits.  Serial troponins are negative and at this time I have a low suspicion of ACS/MI.  Her  EKG shows some different morphology compared to previous EKGs but no concerning ischemic abnormalities today.  Chest x-ray shows no active cardiopulmonary disease and head CT is negative for acute intracranial abnormality, skull fracture or other pathology.  We will consult cardiology for further recommendations, specifically the heart failure team.  We will  also interrogate her defibrillator for further evaluation.  3:30PM Care signed out to oncoming provider PA Percell Miller.  Pending cardiology recommendations.  I spoke with PA Rosita Fire with cardiology who has emergently assessed the patient in the ED and is currently reviewing the patient's defibrillator interrogation report.  She feels that the patient's symptoms are likely due to dehydration and that she may be over diuresed.  If the report is reassuring then she feels the patient is likely stable for discharge home with outpatient cardiology follow-up and plan to scale back on her Lasix to only as needed use.  Patient has remained hemodynamically stable and in no distress while in the ED.     Final Clinical Impression(s) / ED Diagnoses Final diagnoses:  Syncope and collapse  Hyperglycemia    Rx / DC Orders ED Discharge Orders    None       Renita Papa, PA-C 05/04/20 1544    Fredia Sorrow, MD 05/05/20 [phone]

## 2020-05-04 NOTE — ED Notes (Signed)
Patient transported to CT 

## 2020-05-04 NOTE — ED Provider Notes (Signed)
63yo history of CHF, EF 25-30% with AICD, syncope x 2, hypotensive with EMS. Seen by cardiology, requested device report, 1 event of a-tach yesterday. Awaiting cardiology dispo, [name], PA-C.  Physical Exam  BP 101/65   Pulse 78   Temp 98.4 F (36.9 C) (Oral)   Resp 18   SpO2 97%   Physical Exam  ED Course/Procedures     Procedures  MDM  Was seen by cardiology who recommend discharge at this time.  Recommend patient change her Lasix to as needed and her digoxin to half dose.  These recommendations were added to her discharge paperwork.       Tacy Learn, PA-C 05/04/20 [place], [place], DO 05/04/20 1648

## 2020-05-04 NOTE — ED Notes (Signed)
Patient verbalizes understanding of discharge instructions. Opportunity for questioning and answers were provided. Armband removed by staff, pt discharged from ED.  

## 2020-05-04 NOTE — Consult Note (Addendum)
Advanced Heart Failure Team Consult Note   Primary Physician: Donald Prose, MD PCP-Cardiologist:  Sinclair Grooms, MD  [facility]: Dr. Aundra Dubin   Reason for Consultation: Syncope  HPI:    Toni [name] is seen today for evaluation of syncope at the request of Dr. Rogene Houston, Emergency Medicine.   [name] is a 64 y.o.[place] employeewithahistory of chronic systolic heart failure, due to nonischemic cardiomyopathy, HTN, DM,andhyperlipidemia.   Initially diagnosed with NICM in 1998 thought to be from HTN versus viral. Had cath in 1998 that was negative for coronary disease. EF at that time was 20% but EF recovered in 2012.   In October 2019, she hada cough/virus and she took OTC meds. Says she would feel better for a little while but then felt bad again. 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  She presented to [facility] ED on 11/13/18 with increased shortness of breath. She was admitted and echo was completed showing EF had gone back down to 15%. She had RHC/LHC with nonobstructive CAD and relatively preserved cardiac output. She was diuresed in the hospital and discharged. CPX in 2/20 showed only mild HF limitation.   Given family history of cardiomyopathy, she was sent for genetic testing.  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  Dr. Aundra Dubin referred her to see Dr. Broadus John. It is suspected that the variant may be benign and unrelated to her cardiomyopathy.  She has no history of conduction disturbance.   Echo in 6/20 showed that EF remains low at 25-30%, mild LV dilation, mildly decreased RV systolic function. She had a [place] placed.   Recently seen in clinic by Dr. Aundra Dubin 03/29/20. Doing ok from functional and volume standpoint. No med changes made given baseline SBP in the 90s. She was continued the following regimen - Coreg 25 mg  bid.   - Losartan 25 mg bid.  She cannot take Entresto with history of ACEI angioedema. - dapagliflozin 10 mg daily .  - Lasix 20 mg daily. - Spironolactone 25 mg daily.  - Digoxin 0.125 mg daily  - Hydralazine 50 mg tid  -  Isordil 20 mg tid.   She now presents to the [facility] ED w/ complaints of syncope x 1 while at a store. Was in her usual state of health this am. Took her morning meds but only ate a peach for breakfast (usually eats a full breakfast). Went to the store and while waiting outside for store to open, she felt dizzy and lightheaded. Had to lean against the wall. Once in the store, she grabbed a cart to lean on. Next thing she knew, she was on the floor. Bystanders reported that she had hit her head on a counter. She denies any associated palpitations or chest pain. Hypotensive on EMS arrival. BP 84/52, initially. Given 1500 cc NS enroute to ED. BP in ED on arrrival improved to 130/72. BCG 375. HR 78. Tele shows frequent isolated PVCs. No VT. Head CT negative. Hs trop 3>>3. BNP 22. CBC and BMP unremarkable. Hgb 14.0. SCr 0.83. BUN 19. K 5.1. UA shows glucosuria, >500 mg/dL.    Device interrogation shows no ventricular arrhythmias. Heart Logic Heart Failure Index is 0.   She reports that she has been eating more "bad foods" the last several weeks and blood glucose readings have been running higher than  normal. She has also noticed increased urination recently. She is on [place]. UA shows glucosuria, >500 mg/dL.    Review of Systems: [y] = yes, [ ]  = no    General: Weight gain [ ] ; Weight loss [ ] ; Anorexia [ ] ; Fatigue [ ] ; Fever [ ] ; Chills [ ] ; Weakness [ ]    Cardiac: Chest pain/pressure [ ] ; Resting SOB [ ] ; Exertional SOB [ ] ; Orthopnea [ ] ; Pedal Edema [ ] ; Palpitations [ ] ; Syncope [ ] ; Presyncope [ ] ; Paroxysmal nocturnal dyspnea[ ]    Pulmonary: Cough [ ] ; Wheezing[ ] ; Hemoptysis[ ] ; Sputum [ ] ; Snoring [ ]    GI: Vomiting[ ] ; Dysphagia[ ] ; Melena[ ] ; Hematochezia [ ] ;  Heartburn[ ] ; Abdominal pain [ ] ; Constipation [ ] ; Diarrhea [ ] ; BRBPR [ ]    GU: Hematuria[ ] ; Dysuria [ ] ; Nocturia[ ]    Vascular: Pain in legs with walking [ ] ; Pain in feet with lying flat [ ] ; Non-healing sores [ ] ; Stroke [ ] ; TIA [ ] ; Slurred speech [ ] ;   Neuro: Headaches[ ] ; Vertigo[ ] ; Seizures[ ] ; Paresthesias[ ] ;Blurred vision [ ] ; Diplopia [ ] ; Vision changes [ ]    Ortho/Skin: Arthritis [ ] ; Joint pain [ ] ; Muscle pain [ ] ; Joint swelling [ ] ; Back Pain [ ] ; Rash [ ]    Psych: Depression[ ] ; Anxiety[ ]    Heme: Bleeding problems [ ] ; Clotting disorders [ ] ; Anemia [ ]    Endocrine: Diabetes [ ] ; Thyroid dysfunction[ ]   Home Medications Prior to Admission medications   Medication Sig Start Date End Date Taking? Authorizing Provider  aspirin 81 MG chewable tablet Chew 1 tablet (81 mg total) by mouth daily. 11/20/18  Yes Dhungel, Nishant, MD  carvedilol (COREG) 25 MG tablet Take 1 tablet (25 mg total) by mouth 2 (two) times daily with a meal. 12/30/19  Yes Larey Dresser, MD  cetirizine (ZYRTEC) 10 MG tablet Take 10 mg by mouth daily.   Yes [provider]  dapagliflozin propanediol (FARXIGA) 10 MG TABS tablet Take 10 mg by mouth daily.   Yes [provider]  digoxin (LANOXIN) 0.125 MG tablet TAKE 1 TABLET BY MOUTH DAILY. Patient taking differently: Take 0.125 mg by mouth daily.  12/09/19  Yes Clegg, Amy D, NP  furosemide (LASIX) 20 MG tablet TAKE 1 TABLET (20 MG TOTAL) BY MOUTH DAILY. 04/15/20  Yes Larey Dresser, MD  glipiZIDE (GLUCOTROL XL) 10 MG 24 hr tablet Take 10 mg by mouth 2 (two) times daily with a meal.    Yes [provider]  hydrALAZINE (APRESOLINE) 50 MG tablet TAKE 1 TABLET (50 MG TOTAL) BY MOUTH 3 (THREE) TIMES DAILY. 04/21/20  Yes Larey Dresser, MD  isosorbide dinitrate (ISORDIL) 20 MG tablet TAKE 1 TABLET (20 MG TOTAL) BY MOUTH 3 TIMES DAILY. Patient taking differently: Take 20 mg by mouth 3 (three) times daily.  01/05/20  Yes Larey Dresser, MD  LINZESS 145 MCG CAPS capsule Take 145 mcg by mouth daily. 04/06/19  Yes [provider]  liraglutide (VICTOZA) 18 MG/3ML SOPN Inject 1.8 mg into the skin every evening.   Yes [provider]  losartan (COZAAR) 25 MG tablet TAKE 1 TABLET BY MOUTH 2 TIMES A DAY. Patient taking differently: Take 25 mg by mouth daily.  10/19/19  Yes Larey Dresser, MD  metFORMIN (GLUCOPHAGE) 1000 MG tablet Take 1,000 mg by mouth 2 (two) times daily with a meal.   Yes [provider]  Multiple Vitamin (MULTIVITAMIN  WITH MINERALS) TABS tablet Take 1 tablet by mouth daily.   Yes [provider]  sertraline (ZOLOFT) 50 MG tablet Take 50 mg by mouth daily.   Yes [provider]  simvastatin (ZOCOR) 20 MG tablet Take 20 mg by mouth every evening.   Yes [provider]  spironolactone (ALDACTONE) 25 MG tablet TAKE 1 TABLET BY MOUTH EVERY EVENING. Patient taking differently: Take 25 mg by mouth every evening.  06/29/19  Yes Larey Dresser, MD  TRUE METRIX BLOOD GLUCOSE TEST test strip 1 each by Other route as directed.  12/05/15  Yes [provider]  TRUEPLUS LANCETS 30G MISC 1 each by Other route as directed. Use as directed. 12/05/15  Yes [provider]  UNIFINE PENTIPS 32G X 4 MM MISC 1 each by Other route as directed.  03/06/19  Yes [provider]    Past Medical History: Past Medical History:  Diagnosis Date   CHF (congestive heart failure) ([place])    Diabetes mellitus without complication ([place])    Hypertension     Past Surgical History: Past Surgical History:  Procedure Laterality Date   ABDOMINAL AORTIC ANEURYSM REPAIR     ABDOMINAL HYSTERECTOMY     CARDIAC CATHETERIZATION     in [place], clean, per pt.   ICD IMPLANT N/A 06/18/2019   Procedure: ICD IMPLANT;  Surgeon: Evans Lance, MD;  Location: [place] [facility];  Service: Cardiovascular;  Laterality: N/A;   RIGHT/LEFT HEART CATH AND CORONARY  ANGIOGRAPHY N/A 11/17/2018   Procedure: RIGHT/LEFT HEART CATH AND CORONARY ANGIOGRAPHY;  Surgeon: Belva Crome, MD;  Location: [place] [facility];  Service: Cardiovascular;  Laterality: N/A;    Family History: Family History  Problem Relation Age of Onset   Diabetes Mellitus I Mother    Lung cancer Mother    Diabetes Mellitus I Father    Sudden death Father 36   Hypertension Brother    Hypertension Brother    Diabetes Mellitus I Brother    Benign prostatic hyperplasia Brother    Heart failure Paternal Uncle     Social History: Social History   Socioeconomic History   Marital status: Single    Spouse name: Not on file   Number of children: Not on file   Years of education: 16   Highest education level: Bachelor's degree (e.g., BA, AB, BS)  Occupational History   Occupation: Surveyor, quantity: [employer]  Tobacco Use   Smoking status: Never Smoker   Smokeless tobacco: Never Used  Scientific laboratory technician Use: Never used  Substance and Sexual Activity   Alcohol use: Yes    Alcohol/week: 0.0 standard drinks    Comment: less than once a month   Drug use: No   Sexual activity: Never  Other Topics Concern   Not on file  Social History Narrative   Works at [employer].  Lives alone.     Social Determinants of Health   Financial Resource Strain: Low Risk    Difficulty of Paying Living Expenses: Not very hard  Food Insecurity:    Worried About Charity fundraiser in the Last Year:    Arboriculturist in the Last Year:   Transportation Needs: No Transportation Needs   Lack of Transportation (Medical): No   Lack of Transportation (Non-Medical): No  Physical Activity: Inactive   Days of Exercise per Week: 0 days   Minutes of Exercise per Session: 0 min  Stress: Stress Concern Present   Feeling of Stress : Very much  Social Connections:    Frequency of Communication with Friends and Family:    Frequency of Social Gatherings with  Friends and Family:    Attends Religious Services:    Active Member of Clubs or Organizations:    Attends Archivist Meetings:    Marital Status:     Allergies:  Allergies  Allergen Reactions   Biaxin [Clarithromycin] Nausea Only   Enalapril Maleate Other (See Comments)    Lip swelling / angioedema   Vasotec [Enalapril] Other (See Comments)    Lip swelling / angioedema Tolerates ARB    Objective:    Vital Signs:   Temp:  [98.4 F (36.9 C)] 98.4 F (36.9 C) (06/16 1020) Pulse Rate:  [35-120] 78 (06/16 1316) Resp:  [7-38] 18 (06/16 1316) BP: (95-125)/(59-74) 101/65 (06/16 1315) SpO2:  [96 %-100 %] 97 % (06/16 1316)    Weight change: There were no vitals filed for this visit.  Intake/Output:  No intake or output data in the 24 hours ending 05/04/20 1411    Physical Exam    General:  Well appearing/ thin AAF. No resp difficulty HEENT: normal Neck: supple. JVP . Carotids 2+ bilat; no bruits. No lymphadenopathy or thyromegaly appreciated. Cor: PMI nondisplaced. Regular rate & rhythm. No rubs, gallops or murmurs. Lungs: clear Abdomen: soft, nontender, nondistended. No hepatosplenomegaly. No bruits or masses. Good bowel sounds. Extremities: no cyanosis, clubbing, rash, edema Neuro: alert & orientedx3, cranial nerves grossly intact. moves all 4 extremities w/o difficulty. Affect pleasant   Telemetry   NSR w/ frequent isolated PVCs   EKG    NSR 76 bpm, LAD. No ischemic abnormalties   Labs   Basic Metabolic Panel: Recent Labs  Lab 05/04/20 1045  NA 136  K 5.1  CL 100  CO2 22  GLUCOSE 319*  BUN 19  CREATININE 0.83  CALCIUM 9.5    Liver Function Tests: No results for input(s): AST, ALT, ALKPHOS, BILITOT, PROT, ALBUMIN in the last 168 hours. No results for input(s): LIPASE, AMYLASE in the last 168 hours. No results for input(s): AMMONIA in the last 168 hours.  CBC: Recent Labs  Lab 05/04/20 1045  WBC 9.7  HGB 14.0  HCT 45.0  MCV  94.1  PLT 280    Cardiac Enzymes: No results for input(s): CKTOTAL, CKMB, CKMBINDEX, TROPONINI in the last 168 hours.  BNP: BNP (last 3 results) Recent Labs    05/04/20 1045  BNP 22.7    ProBNP (last 3 results) No results for input(s): PROBNP in the last 8760 hours.   CBG: Recent Labs  Lab 05/04/20 1035  GLUCAP 283*    Coagulation Studies: No results for input(s): LABPROT, INR in the last 72 hours.   Imaging   DG Chest 2 View  Result Date: 05/04/2020 CLINICAL DATA:  Syncope 3 times today. EXAM: CHEST - 2 VIEW COMPARISON:  06/18/2019 FINDINGS: The heart size and pulmonary vascularity are normal and the lungs are clear. No effusions. AICD in place. No bone abnormality. IMPRESSION: No active cardiopulmonary disease. Electronically Signed   By: Lorriane Shire M.D.   On: 05/04/2020 11:25   CT Head Wo Contrast  Result Date: 05/04/2020 CLINICAL DATA:  Recurrent syncopal episodes. EXAM: CT HEAD WITHOUT CONTRAST TECHNIQUE: Contiguous axial images were obtained from the base of the skull through the vertex without intravenous contrast. COMPARISON:  None. FINDINGS: Brain: There is no evidence of acute intracranial hemorrhage, mass lesion,  brain edema or extra-axial fluid collection. The ventricles and subarachnoid spaces are appropriately sized for age. There is no CT evidence of acute cortical infarction. Vascular: Intracranial vascular calcifications. No hyperdense vessel identified. Skull: Negative for fracture or focal lesion. Sinuses/Orbits: The visualized paranasal sinuses and mastoid air cells are clear. No orbital abnormalities are seen. Other: None. IMPRESSION: Normal noncontrast head CT. Electronically Signed   By: Richardean Sale M.D.   On: 05/04/2020 12:45      Medications:     Current Medications:  sodium chloride flush  3 mL Intravenous Once     Infusions:    Assessment/Plan   1. Syncope. - Device interrogation shows no arrhthymias.  - Head CT negative.    - Initially hypotensive w/ SBP in the 80s. Improved w/ IVFs. Hgb normal. Heart Logic Heart Failure Index is 0. Suspect volume depletion as most likely etiology, likely 2/2 increased urination. On SGLT2i w/ blood glucose levels in the 300s. Also notes recent increased polyuria. UA shows glucosuria, >500 mg/dL.  - also on several other BP active medications for systolic heart failure and took AM meds w/ little food this morning. Prior to this, her recent baseline SBP was in the 63O systolic  - can stop daily lasix and change to PRN based on daily wts (take for > 3 lb wt gain in 24 hrs or > 5 lb in 1 week) - continue all other meds at current doses   2. Chronic Systolic Heart Failure  - Nonischemic cardiomyopathy by 12/19 cath.  Cardiac MRI with LV EF 14%, RV EF 17%. No definite evidence for myocarditis or infiltrative disease by delayed enhancement images.  Most likely cause of cardiomyopathy is familial versus prior viral myocarditis.  Brother also had a cardiomyopathy of uncertain etiology.  Genetic testing was done, showing an LMNA gene variant of uncertain significance => she saw Dr. Broadus John, suspect benign/uninvolved variant (no conduction abnormality).  CPX in 2/20 showed only mild HF limitation.  Echo in 6/20 showed EF 25-30%. She now has a [place]. Narrow QRS, not CRT candidate.  - Stable NYHA class II symptoms. - Suspect she is a little dry 2/2 overdiuresis. Heart Logic Heart Failure Index 0. BNP 22.  - Change Lasix to PRN as outlined above - Continue Hydralazine to 50 mg tid  - Continue Spironolactone 25 mg daily  - Continue Coreg 25 mg bid - Continue Isordil 20 mg tid - Reduce digoxin 0.0625 mg daily (dlig level elevated 0.9) - Continue Farxiga 10 mg daily   Ok to d/c home from ED. Cardiac Meds for Discharge  - ASA 81 mg  - Hydralazine 50 mg tid  - Coreg 25 mg bid - Isordil 20 mg tid - Spironolactone 25 mg daily  - Reduce digoxin 0.0625 mg daily (dlig level elevated  0.9) - Continue Farxiga 10 mg daily  - Change Lasix to 40 mg PRN   We will arrange post ED f/u in the [facility].     Length of Stay: 0  Lyda Jester, PA-C  05/04/2020, 2:11 PM  Advanced Heart Failure Team Pager [phone] (M-F; 7a - 4p)  Please contact [facility] for night-coverage after hours (4p -7a ) and weekends on [email or web address]  Patient seen and examined with the above-signed Advanced Practice Provider and/or Housestaff. I personally reviewed laboratory data, imaging studies and relevant notes. I independently examined the patient and formulated the important aspects of the plan. I have edited the note to reflect any of my changes or  salient points. I have personally discussed the plan with the patient and/or family.  ICD interrogated personally.   Suspect syncopal episode related to volume depletion. Much improved with 1.5L IVF.   ICD without arrhythmia. HL score 0. Labs ok x for high glucose  General:  Well appearing. No resp difficulty HEENT: normal Neck: supple. no JVD. Carotids 2+ bilat; no bruits. No lymphadenopathy or thryomegaly appreciated. Cor: PMI nondisplaced. Regular rate & rhythm. No rubs, gallops or murmurs. Lungs: clear Abdomen: soft, nontender, nondistended. No hepatosplenomegaly. No bruits or masses. Good bowel sounds. Extremities: no cyanosis, clubbing, rash, edema Neuro: alert & orientedx3, cranial nerves grossly intact. moves all 4 extremities w/o difficulty. Affect pleasant  Will stop lasix. Continue Farxiga. Given dig level 0.9 will cut dig dose in half.   F/u in HF Clinic.  Glori Bickers, MD  5:16 PM

## 2020-05-04 NOTE — Discharge Instructions (Signed)
Per cardiology- STOP Lasix- take as needed Digoxin take 1/2 dose

## 2020-05-05 ENCOUNTER — Other Ambulatory Visit (HOSPITAL_COMMUNITY): Payer: Self-pay | Admitting: Cardiology

## 2020-05-05 MED FILL — LOSARTAN POTASSIUM 25 MG TA: 25 | 90 days supply | Qty: 180 | Fill #0

## 2020-05-17 MED FILL — SIMVASTATIN 20 MG TABLET: 20 | 90 days supply | Qty: 90 | Fill #0

## 2020-05-18 ENCOUNTER — Other Ambulatory Visit: Payer: Self-pay | Admitting: Cardiology

## 2020-05-18 MED FILL — BASAGLAR 100 UNIT/ML KWIKPE: 100 | 90 days supply | Qty: 18 | Fill #0

## 2020-05-31 ENCOUNTER — Telehealth (HOSPITAL_COMMUNITY): Payer: Self-pay | Admitting: Pharmacy Technician

## 2020-05-31 ENCOUNTER — Encounter (HOSPITAL_COMMUNITY): Payer: Self-pay | Admitting: Cardiology

## 2020-05-31 ENCOUNTER — Ambulatory Visit (HOSPITAL_COMMUNITY)
Admission: RE | Admit: 2020-05-31 | Discharge: 2020-05-31 | Disposition: A | Discharge status: Home / Self Care | Payer: No Typology Code available for payment source | Source: Ambulatory Visit | Attending: Cardiology | Admitting: Cardiology

## 2020-05-31 ENCOUNTER — Other Ambulatory Visit: Payer: Self-pay

## 2020-05-31 VITALS — BP 110/72 | HR 76 | Wt 141.0 lb

## 2020-05-31 DIAGNOSIS — Z8249 Family history of ischemic heart disease and other diseases of the circulatory system: Secondary | ICD-10-CM | POA: Diagnosis not present

## 2020-05-31 DIAGNOSIS — Z833 Family history of diabetes mellitus: Secondary | ICD-10-CM | POA: Diagnosis not present

## 2020-05-31 DIAGNOSIS — E785 Hyperlipidemia, unspecified: Secondary | ICD-10-CM | POA: Insufficient documentation

## 2020-05-31 DIAGNOSIS — F329 Major depressive disorder, single episode, unspecified: Secondary | ICD-10-CM | POA: Diagnosis not present

## 2020-05-31 DIAGNOSIS — Z95810 Presence of automatic (implantable) cardiac defibrillator: Secondary | ICD-10-CM | POA: Diagnosis not present

## 2020-05-31 DIAGNOSIS — I5022 Chronic systolic (congestive) heart failure: Secondary | ICD-10-CM | POA: Insufficient documentation

## 2020-05-31 DIAGNOSIS — Z888 Allergy status to other drugs, medicaments and biological substances status: Secondary | ICD-10-CM | POA: Insufficient documentation

## 2020-05-31 DIAGNOSIS — Z7984 Long term (current) use of oral hypoglycemic drugs: Secondary | ICD-10-CM | POA: Diagnosis not present

## 2020-05-31 DIAGNOSIS — Z7982 Long term (current) use of aspirin: Secondary | ICD-10-CM | POA: Diagnosis not present

## 2020-05-31 DIAGNOSIS — Z79899 Other long term (current) drug therapy: Secondary | ICD-10-CM | POA: Diagnosis not present

## 2020-05-31 DIAGNOSIS — M7502 Adhesive capsulitis of left shoulder: Secondary | ICD-10-CM | POA: Diagnosis not present

## 2020-05-31 DIAGNOSIS — E119 Type 2 diabetes mellitus without complications: Secondary | ICD-10-CM | POA: Insufficient documentation

## 2020-05-31 DIAGNOSIS — I428 Other cardiomyopathies: Secondary | ICD-10-CM | POA: Insufficient documentation

## 2020-05-31 DIAGNOSIS — I110 Hypertensive heart disease with heart failure: Secondary | ICD-10-CM | POA: Insufficient documentation

## 2020-05-31 DIAGNOSIS — I2510 Atherosclerotic heart disease of native coronary artery without angina pectoris: Secondary | ICD-10-CM | POA: Insufficient documentation

## 2020-05-31 LAB — BASIC METABOLIC PANEL
Anion gap: 9 (ref 5–15)
BUN: 15 mg/dL (ref 8–23)
CO2: 26 mmol/L (ref 22–32)
Calcium: 9.7 mg/dL (ref 8.9–10.3)
Chloride: 102 mmol/L (ref 98–111)
Creatinine, Ser: 0.73 mg/dL (ref 0.44–1.00)
GFR calc Af Amer: >60 mL/min (ref >60)
GFR calc non Af Amer: >60 mL/min (ref >60)
Glucose, Bld: 216 mg/dL — ABNORMAL HIGH (ref 70–99)
Potassium: 4.8 mmol/L (ref 3.5–5.1)
Sodium: 137 mmol/L (ref 135–145)

## 2020-05-31 LAB — DIGOXIN LEVEL: Digoxin Level: 0.3 ng/mL — ABNORMAL LOW (ref 0.8–2.0)

## 2020-05-31 NOTE — Progress Notes (Signed)
PCP: Dr. Nancy Fetter Cardiology: Dr. Tamala Julian HF Cardiology: Dr. Aundra Dubin  Toni [name] is a 64 y.o. [place] employee with a history of chronic systolic heart failure, due to nonischemic cardiomyopathy, HTN, DM, and hyperlipidemia.   Initially diagnosed with NICM in 1998 thought to be from HTN versus viral. Had cath in 1998 that was negative for coronary disease. EF at that time was 20% but EF recovered in 2012.   In October 2019, she had a cough/virus and she took OTC meds. Says she would feel better for a little while but then felt bad again. 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  She presented to [facility] ED on 11/13/18 with increased shortness of breath. She was admitted and echo was completed showing EF had gone back down to 15%. She had RHC/LHC with nonobstructive CAD and relatively preserved cardiac output. She was diuresed in the hospital and discharged. CPX in 2/20 showed only mild HF limitation.   Given family history of cardiomyopathy, I sent genetic testing.  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  I had her see Dr. Broadus John, we think that the variant may be benign and unrelated to her cardiomyopathy.  She has no history of conduction disturbance.   Echo in 6/20 showed that EF remains low at 25-30%, mild LV dilation, mildly decreased RV systolic function.  She had a [place] placed.   She returns today for followup of CHF.  Weight is down 2 lbs.  She had an episode of syncope while in a store in 6/21, no arrhythmia noted on device interrogation.  Episode was thought to be due to dehydration/orthostasis.  She was told to stop daily Lasix (take prn) and decreased digoxin to 0.0625 due to elevated level.  Since then, she has been doing well.  No further lightheadedness or syncope.  She has not taken any Lasix.  No significant exertional dyspnea.  No  orthopnea/PND.  No chest pain.    [employer] device interrogation: Heartlogic score 4, no VT    Labs (1/20): K 4, creatinine 0.67 => 0.74 Labs (3/20): K 4.1, creatinine 0.87 Labs (6/20): digoxin level 0.9 Labs (7/20): K 4.1, creatinine 0.74 Labs (8/20): digoxin 0.4 Labs (11/20): K 4.5, creatinine 0.65, digoxin 1.2 Labs (2/21): digoxin 0.4, K 4.2, creatinine 0.9 Labs (6/21): K 5.1, creatinine 0.83  PMH: 1. HTN 2. Type 2 diabetes 3. Hyperlipidemia 4. Chronic systolic CHF: Nonischemic cardiomyopathy.  Diagnosed in 1998, EF 20% by echo at that time.  Echo back to normal range by 2012.   - LHC/RHC (12/19): D1 60-70% stenosis; mean RA 6, PA 58/22, mean PCWP 22, CI 2.9.  - Echo (12/19): EF 15% with severe LV dilation, moderate central MR likely functional.  - Cardiac MRI (12/19): Moderate LV dilation with EF 14%, mild RV dilation with EF 17%, LGE at the inferior RV insertion site (nonspecific).  - CPX (2/20): peak VO2 18.6, VE/VCO2 31, RER 1.18 => mild HF limitation.  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  Suspect the variant is benign.  - Echo (6/20): EF 25-30%, mild LV dilation, mildly decreased RV systolic function.  5. Angioedema with ACEI 6. Left shoulder adhesive capsulitis 7. Depression  Social History   Socioeconomic History  . Marital status: Single    Spouse name: Not on file  . Number  of children: Not on file  . Years of education: 89  . Highest education level: Bachelor's degree (e.g., BA, AB, BS)  Occupational History  . Occupation: Surveyor, quantity: [employer]  Tobacco Use  . Smoking status: Never Smoker  . Smokeless tobacco: Never Used  Vaping Use  . Vaping Use: Never used  Substance and Sexual Activity  . Alcohol use: Yes    Alcohol/week: 0.0 standard drinks    Comment: less than once a month  . Drug use: No  . Sexual activity: Never  Other Topics Concern  . Not on  file  Social History Narrative   Works at [employer].  Lives alone.     Social Determinants of Health   Financial Resource Strain: Low Risk   . Difficulty of Paying Living Expenses: Not very hard  Food Insecurity:   . Worried About Charity fundraiser in the Last Year:   . Arboriculturist in the Last Year:   Transportation Needs: No Transportation Needs  . Lack of Transportation (Medical): No  . Lack of Transportation (Non-Medical): No  Physical Activity: Inactive  . Days of Exercise per Week: 0 days  . Minutes of Exercise per Session: 0 min  Stress: Stress Concern Present  . Feeling of Stress : Very much  Social Connections:   . Frequency of Communication with Friends and Family:   . Frequency of Social Gatherings with Friends and Family:   . Attends Religious Services:   . Active Member of Clubs or Organizations:   . Attends Archivist Meetings:   Marland Kitchen Marital Status:   Intimate Partner Violence:   . Fear of Current or Ex-Partner:   . Emotionally Abused:   Marland Kitchen Physically Abused:   . Sexually Abused:    Family History  Problem Relation Age of Onset  . Diabetes Mellitus I Mother   . Lung cancer Mother   . Diabetes Mellitus I Father   . Sudden death Father 50  . Hypertension Brother   . Hypertension Brother   . Diabetes Mellitus I Brother   . Benign prostatic hyperplasia Brother   . Heart failure Paternal Uncle    ROS: All systems reviewed and negative except as per HPI.   Current Outpatient Medications  Medication Sig Dispense Refill  . aspirin 81 MG chewable tablet Chew 1 tablet (81 mg total) by mouth daily. 30 tablet 0  . carvedilol (COREG) 25 MG tablet Take 1 tablet (25 mg total) by mouth 2 (two) times daily with a meal. 60 tablet 5  . dapagliflozin propanediol (FARXIGA) 10 MG TABS tablet Take 10 mg by mouth daily.    . digoxin (LANOXIN) 0.25 MG tablet Take 0.0625 mg by mouth daily.    . furosemide (LASIX) 20 MG tablet Take 20 mg by mouth as needed.    Marland Kitchen  glipiZIDE (GLUCOTROL XL) 10 MG 24 hr tablet Take 10 mg by mouth 2 (two) times daily with a meal.     . hydrALAZINE (APRESOLINE) 50 MG tablet TAKE 1 TABLET (50 MG TOTAL) BY MOUTH 3 (THREE) TIMES DAILY. 270 tablet 1  . isosorbide dinitrate (ISORDIL) 20 MG tablet TAKE 1 TABLET (20 MG TOTAL) BY MOUTH 3 TIMES DAILY. (Patient taking differently: Take 20 mg by mouth 3 (three) times daily. ) 90 tablet 3  . LINZESS 145 MCG CAPS capsule Take 145 mcg by mouth daily.    Marland Kitchen liraglutide (VICTOZA) 18 MG/3ML SOPN Inject 1.8 mg into the skin  every evening.    Marland Kitchen losartan (COZAAR) 25 MG tablet TAKE 1 TABLET BY MOUTH 2 TIMES A DAY. 180 tablet 3  . metFORMIN (GLUCOPHAGE) 1000 MG tablet Take 1,000 mg by mouth 2 (two) times daily with a meal.    . Multiple Vitamin (MULTIVITAMIN WITH MINERALS) TABS tablet Take 1 tablet by mouth daily.    . sertraline (ZOLOFT) 50 MG tablet Take 50 mg by mouth daily.    . simvastatin (ZOCOR) 20 MG tablet Take 20 mg by mouth every evening.    Marland Kitchen spironolactone (ALDACTONE) 25 MG tablet TAKE 1 TABLET BY MOUTH EVERY EVENING. (Patient taking differently: Take 25 mg by mouth every evening. ) 90 tablet 3  . TRUE METRIX BLOOD GLUCOSE TEST test strip 1 each by Other route as directed.   5  . TRUEPLUS LANCETS 30G MISC 1 each by Other route as directed. Use as directed.  5  . UNIFINE PENTIPS 32G X 4 MM MISC 1 each by Other route as directed.     . cetirizine (ZYRTEC) 10 MG tablet Take 10 mg by mouth daily. (Patient not taking: Reported on 05/31/2020)     No current facility-administered medications for this encounter.   BP 110/72   Pulse 76   Wt 64 kg (141 lb)   SpO2 97%   BMI 22.76 kg/m  General: NAD Neck: No JVD, no thyromegaly or thyroid nodule.  Lungs: Clear to auscultation bilaterally with normal respiratory effort. CV: Nondisplaced PMI.  Heart regular S1/S2, no S3/S4, no murmur.  No peripheral edema.  No carotid bruit.  Normal pedal pulses.  Abdomen: Soft, nontender, no  hepatosplenomegaly, no distention.  Skin: Intact without lesions or rashes.  Neurologic: Alert and oriented x 3.  Psych: Normal affect. Extremities: No clubbing or cyanosis.  HEENT: Normal.   Assessment/Plan: 1. Chronic systolic CHF: Nonischemic cardiomyopathy by 12/19 cath.  Cardiac MRI with LV EF 14%, RV EF 17%. No definite evidence for myocarditis or infiltrative disease by delayed enhancement images.  Most likely cause of cardiomyopathy is familial versus prior viral myocarditis.  Brother also had a cardiomyopathy of uncertain etiology.  Genetic testing was done, showing an LMNA gene variant of uncertain significance => she saw Dr. Broadus John, suspect benign/uninvolved variant (no conduction abnormality).  CPX in 2/20 showed only mild HF limitation.  Echo in 6/20 showed EF 25-30%. She now has a [place].  On exam and by Heartlogic, she is not volume overloaded.  NYHA class II symptoms.  Narrow QRS, not CRT candidate.  Recent syncope due to dehydration/orthostasis, will not titrate up her cardiac meds today.  - Continue Coreg 25 mg bid.   - Continue losartan 25 mg bid.  She cannot take Entresto with history of ACEI angioedema. BMET today.  - Continue dapagliflozin.  - She will continue to take Lasix only prn.   - Continue spironolactone 25 mg daily.  - Continue digoxin, check level today.  - Continue hydralazine 50 mg tid and isordil 20 mg tid.  - I will arrange for repeat echo at followup appt.  2. Type II diabetes: She is on dapagliflozin.   Followup in 4 months with echo.   Loralie Champagne 05/31/2020

## 2020-05-31 NOTE — Patient Instructions (Signed)
Labs today We will only contact you if something comes back abnormal or we need to make some changes. Otherwise no news is good news!  Your physician recommends that you schedule a follow-up appointment in: [facility]  Your physician has requested that you have an echocardiogram. Echocardiography is a painless test that uses sound waves to create images of your heart. It provides your doctor with information about the size and shape of your heart and how well your heart's chambers and valves are working. This procedure takes approximately one hour. There are no restrictions for this procedure.  Do the following things EVERYDAY: 1) Weigh yourself in the morning before breakfast. Write it down and keep it in a log. 2) Take your medicines as prescribed 3) Eat low salt foods--Limit salt (sodium) to 2000 mg per day.  4) Stay as active as you can everyday 5) Limit all fluids for the day to less than 2 liters

## 2020-05-31 NOTE — Telephone Encounter (Signed)
Patient was in the clinic this morning. Her insurance will be changing next month and she isn't sure what her [place] co-pay will be. Started an AZ&Me application for her in case we need to go down that road. Once her new insurance which I suspect is Medicare is active, I will run a test claim. At that time any PA that is needed will also be submitted.  Will update the patient with the co-pay of Farxiga and we will seek assistance at that time, if need be.

## 2020-06-06 ENCOUNTER — Other Ambulatory Visit: Payer: Self-pay | Admitting: Family Medicine

## 2020-06-06 DIAGNOSIS — Z1231 Encounter for screening mammogram for malignant neoplasm of breast: Secondary | ICD-10-CM

## 2020-06-08 MED FILL — VICTOZA 18 MG/3 ML INJECT P: 18 | 30 days supply | Qty: 9 | Fill #4

## 2020-06-09 ENCOUNTER — Other Ambulatory Visit (HOSPITAL_COMMUNITY): Payer: Self-pay | Admitting: Cardiology

## 2020-06-09 MED FILL — ISOSORBIDE DN 20 MG TABLET: 20 | 30 days supply | Qty: 90 | Fill #0

## 2020-06-09 MED FILL — FUROSEMIDE 20 MG TABS: 20 | 90 days supply | Qty: 90 | Fill #1

## 2020-06-09 MED FILL — BASAGLAR 100 UNIT/ML KWIKPE: 100 | 90 days supply | Qty: 18 | Fill #0

## 2020-06-09 MED FILL — metFORMIN HCL 1000 MG TABS: 1000 | 90 days supply | Qty: 180 | Fill #0

## 2020-06-10 ENCOUNTER — Ambulatory Visit
Admission: RE | Admit: 2020-06-10 | Discharge: 2020-06-10 | Disposition: A | Discharge status: Home / Self Care | Payer: No Typology Code available for payment source | Source: Ambulatory Visit | Attending: Family Medicine | Admitting: Family Medicine

## 2020-06-10 ENCOUNTER — Other Ambulatory Visit: Payer: Self-pay

## 2020-06-10 DIAGNOSIS — Z1231 Encounter for screening mammogram for malignant neoplasm of breast: Secondary | ICD-10-CM

## 2020-06-14 MED FILL — UNIFINE PENTIPS 32GX5/32: 32G X 4 MM | 90 days supply | Qty: 100 | Fill #0

## 2020-06-15 ENCOUNTER — Other Ambulatory Visit: Payer: Self-pay | Admitting: Family Medicine

## 2020-06-15 DIAGNOSIS — R928 Other abnormal and inconclusive findings on diagnostic imaging of breast: Secondary | ICD-10-CM

## 2020-06-24 NOTE — Telephone Encounter (Signed)
Application sent via fax.  Will follow up.

## 2020-06-24 NOTE — Telephone Encounter (Signed)
Spoke with patient this morning. Her new Wilder Glade co-pay with her Medicare plan is $90 for a 30 day supply.  We already have an application for AZ&Me started, will submit application to foundation once provider signs the application.  Will follow up.

## 2020-06-27 ENCOUNTER — Other Ambulatory Visit: Payer: Self-pay

## 2020-06-27 ENCOUNTER — Other Ambulatory Visit (HOSPITAL_COMMUNITY): Payer: Self-pay | Admitting: Cardiology

## 2020-06-27 ENCOUNTER — Other Ambulatory Visit: Payer: Self-pay | Admitting: Family Medicine

## 2020-06-27 ENCOUNTER — Ambulatory Visit
Admission: RE | Admit: 2020-06-27 | Discharge: 2020-06-27 | Disposition: A | Discharge status: Home / Self Care | Payer: No Typology Code available for payment source | Source: Ambulatory Visit | Attending: Family Medicine | Admitting: Family Medicine

## 2020-06-27 DIAGNOSIS — R928 Other abnormal and inconclusive findings on diagnostic imaging of breast: Secondary | ICD-10-CM

## 2020-06-27 DIAGNOSIS — N6310 Unspecified lump in the right breast, unspecified quadrant: Secondary | ICD-10-CM

## 2020-06-27 MED FILL — SPIRONOLACTONE 25 MG TABS: 25 | 90 days supply | Qty: 90 | Fill #0

## 2020-06-28 ENCOUNTER — Ambulatory Visit
Admission: RE | Admit: 2020-06-28 | Discharge: 2020-06-28 | Disposition: A | Discharge status: Home / Self Care | Payer: No Typology Code available for payment source | Source: Ambulatory Visit | Attending: Family Medicine | Admitting: Family Medicine

## 2020-06-28 DIAGNOSIS — N6310 Unspecified lump in the right breast, unspecified quadrant: Secondary | ICD-10-CM

## 2020-06-29 ENCOUNTER — Ambulatory Visit (INDEPENDENT_AMBULATORY_CARE_PROVIDER_SITE_OTHER): Payer: No Typology Code available for payment source | Admitting: *Deleted

## 2020-06-29 DIAGNOSIS — I428 Other cardiomyopathies: Secondary | ICD-10-CM

## 2020-06-29 DIAGNOSIS — I5022 Chronic systolic (congestive) heart failure: Secondary | ICD-10-CM

## 2020-06-30 ENCOUNTER — Other Ambulatory Visit: Payer: No Typology Code available for payment source

## 2020-07-01 ENCOUNTER — Encounter: Payer: Self-pay | Admitting: *Deleted

## 2020-07-01 DIAGNOSIS — Z170 Estrogen receptor positive status [ER+]: Secondary | ICD-10-CM | POA: Insufficient documentation

## 2020-07-01 DIAGNOSIS — C50512 Malignant neoplasm of lower-outer quadrant of left female breast: Secondary | ICD-10-CM

## 2020-07-01 DIAGNOSIS — C50511 Malignant neoplasm of lower-outer quadrant of right female breast: Secondary | ICD-10-CM | POA: Insufficient documentation

## 2020-07-03 LAB — CUP PACEART REMOTE DEVICE CHECK
Battery Remaining Longevity: 174 mo
Battery Remaining Percentage: 100 %
Brady Statistic RV Percent Paced: 0 %
Date Time Interrogation Session: 20210813065900
HighPow Impedance: 60 Ohm
Implantable Lead Implant Date: 20200730
Implantable Lead Location: 753860
Implantable Lead Model: 292
Implantable Lead Serial Number: 447014
Implantable Pulse Generator Implant Date: 20200730
Lead Channel Impedance Value: 418 Ohm
Lead Channel Pacing Threshold Amplitude: 1.4 V
Lead Channel Pacing Threshold Pulse Width: 0.4 ms
Lead Channel Setting Pacing Amplitude: 3.5 V
Lead Channel Setting Pacing Pulse Width: 0.4 ms
Lead Channel Setting Sensing Sensitivity: 0.5 mV
Pulse Gen Serial Number: 266091

## 2020-07-04 NOTE — Progress Notes (Signed)
Remote ICD transmission.   

## 2020-07-05 NOTE — Progress Notes (Signed)
Media NOTE  Patient Care Team: Donald Prose, MD as PCP - General (Family Medicine) Belva Crome, MD as PCP - Cardiology (Cardiology) Larey Dresser, MD as PCP - Advanced Heart Failure (Cardiology) Dannielle Karvonen, RN as [place] Management Mauro Kaufmann, RN as Registered Nurse Rockwell Germany, RN as Oncology Nurse Navigator Coralie Keens, MD as Consulting Physician (General Surgery) Nicholas Lose, MD as Consulting Physician (Hematology and Oncology) Kyung Rudd, MD as Consulting Physician (Radiation Oncology)  CHIEF COMPLAINTS/PURPOSE OF CONSULTATION:  Newly diagnosed breast cancer  HISTORY OF PRESENTING ILLNESS:  Toni [name] 64 y.o. female is here because of recent diagnosis of invasive mammary carcinoma of the right breast. Screening mammogram on 06/10/20 showed a right breast mass. Mammogram and [place] on 06/27/20 showed a 0.7cm mass at the 6 o'clock position in the right breast, no axillary adenopathy. Biopsy on 06/28/20 showed invasive mammary carcinoma, grade 3, HER-2 equivocal by IHC (2+), positive by FISH, ER+ 30%, PR- 0%, Ki67 40%. She presents to the clinic today for initial evaluation and discussion of treatment options.   I reviewed her records extensively and collaborated the history with the patient.  SUMMARY OF ONCOLOGIC HISTORY: Oncology History  Malignant neoplasm of lower-outer quadrant of right breast of female, estrogen receptor positive ([place])  07/01/2020 Initial Diagnosis   Screening mammogram showed a right breast mass. Mammogram and US showed a 0.7cm mass at the 6 o'clock position in the right breast, no axillary adenopathy. 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      MEDICAL HISTORY:  Past Medical History:  Diagnosis Date  . CHF (congestive heart failure) ([place])   . Diabetes mellitus without complication ([place])   .  Hypertension     SURGICAL HISTORY: Past Surgical History:  Procedure Laterality Date  . ABDOMINAL AORTIC ANEURYSM REPAIR    . ABDOMINAL HYSTERECTOMY    . CARDIAC CATHETERIZATION     in [facility], clean, per pt.  . ICD IMPLANT N/A 06/18/2019   Procedure: ICD IMPLANT;  Surgeon: Evans Lance, MD;  Location: [facility];  Service: Cardiovascular;  Laterality: N/A;  . RIGHT/LEFT HEART CATH AND CORONARY ANGIOGRAPHY N/A 11/17/2018   Procedure: RIGHT/LEFT HEART CATH AND CORONARY ANGIOGRAPHY;  Surgeon: Belva Crome, MD;  Location: [facility];  Service: Cardiovascular;  Laterality: N/A;    SOCIAL HISTORY: Social History   Socioeconomic History  . Marital status: Single    Spouse name: Not on file  . Number of children: Not on file  . Years of education: 51  . Highest education level: Bachelor's degree (e.g., BA, AB, BS)  Occupational History  . Occupation: Surveyor, quantity: [employer]  Tobacco Use  . Smoking status: Never Smoker  . Smokeless tobacco: Never Used  Vaping Use  . Vaping Use: Never used  Substance and Sexual Activity  . Alcohol use: Yes    Alcohol/week: 0.0 standard drinks    Comment: less than once a month  . Drug use: No  . Sexual activity: Never  Other Topics Concern  . Not on file  Social History Narrative   Works at [employer].  Lives alone.     Social Determinants of Health   Financial Resource Strain: Low Risk   . Difficulty of Paying Living Expenses: Not very hard  Food Insecurity:   . Worried About  Running Out of Food in the Last Year:   . [place] in the Last Year:   Transportation Needs: No Transportation Needs  . Lack of Transportation (Medical): No  . Lack of Transportation (Non-Medical): No  Physical Activity: Inactive  . Days of Exercise per Week: 0 days  . Minutes of Exercise per Session: 0 min  Stress: Stress Concern Present  . Feeling of Stress : Very much  Social Connections:   . Frequency of  Communication with Friends and Family:   . Frequency of Social Gatherings with Friends and Family:   . Attends Religious Services:   . Active Member of Clubs or Organizations:   . Attends Archivist Meetings:   Marland Kitchen Marital Status:   Intimate Partner Violence:   . Fear of Current or Ex-Partner:   . Emotionally Abused:   Marland Kitchen Physically Abused:   . Sexually Abused:     FAMILY HISTORY: Family History  Problem Relation Age of Onset  . Diabetes Mellitus I Mother   . Lung cancer Mother   . Breast cancer Mother   . Diabetes Mellitus I Father   . Sudden death Father 13  . Hypertension Brother   . Hypertension Brother   . Diabetes Mellitus I Brother   . Benign prostatic hyperplasia Brother   . Heart failure Paternal Uncle     ALLERGIES:  is allergic to biaxin [clarithromycin], enalapril maleate, and vasotec [enalapril].  MEDICATIONS:  Current Outpatient Medications  Medication Sig Dispense Refill  . aspirin 81 MG chewable tablet Chew 1 tablet (81 mg total) by mouth daily. 30 tablet 0  . carvedilol (COREG) 25 MG tablet Take 1 tablet (25 mg total) by mouth 2 (two) times daily with a meal. 60 tablet 5  . cetirizine (ZYRTEC) 10 MG tablet Take 10 mg by mouth daily.     . dapagliflozin propanediol (FARXIGA) 10 MG TABS tablet Take 10 mg by mouth daily.    . digoxin (LANOXIN) 0.25 MG tablet Take 0.0625 mg by mouth daily.    . furosemide (LASIX) 20 MG tablet Take 20 mg by mouth as needed.    Marland Kitchen glipiZIDE (GLUCOTROL XL) 10 MG 24 hr tablet Take 10 mg by mouth 2 (two) times daily with a meal.     . hydrALAZINE (APRESOLINE) 50 MG tablet TAKE 1 TABLET (50 MG TOTAL) BY MOUTH 3 (THREE) TIMES DAILY. 270 tablet 1  . Insulin Glargine (BASAGLAR KWIKPEN) 100 UNIT/ML Inject 20 Units into the skin at bedtime.    . isosorbide dinitrate (ISORDIL) 20 MG tablet TAKE 1 TABLET (20 MG TOTAL) BY MOUTH 3 TIMES DAILY. 90 tablet 11  . LINZESS 145 MCG CAPS capsule Take 145 mcg by mouth daily. As needed    .  liraglutide (VICTOZA) 18 MG/3ML SOPN Inject 1.8 mg into the skin every evening.    Marland Kitchen losartan (COZAAR) 25 MG tablet TAKE 1 TABLET BY MOUTH 2 TIMES A DAY. 180 tablet 3  . metFORMIN (GLUCOPHAGE) 1000 MG tablet Take 1,000 mg by mouth 2 (two) times daily with a meal.    . Multiple Vitamin (MULTIVITAMIN WITH MINERALS) TABS tablet Take 1 tablet by mouth daily.    . Multiple Vitamins-Minerals (HAIR SKIN AND NAILS FORMULA) TABS Take 3 tablets by mouth 3 (three) times daily.    . sertraline (ZOLOFT) 50 MG tablet Take 50 mg by mouth daily.    . simvastatin (ZOCOR) 20 MG tablet Take 20 mg by mouth every evening.    Marland Kitchen  spironolactone (ALDACTONE) 25 MG tablet TAKE 1 TABLET BY MOUTH EVERY EVENING. 90 tablet 3  . TRUE METRIX BLOOD GLUCOSE TEST test strip 1 each by Other route as directed.   5  . TRUEPLUS LANCETS 30G MISC 1 each by Other route as directed. Use as directed.  5  . UNIFINE PENTIPS 32G X 4 MM MISC 1 each by Other route as directed.      No current facility-administered medications for this visit.    REVIEW OF SYSTEMS:     All other systems were reviewed with the patient and are negative.  PHYSICAL EXAMINATION: ECOG PERFORMANCE STATUS: 1 - Symptomatic but completely ambulatory  Vitals:   07/06/20 0842  BP: 112/62  Pulse: 83  Resp: 18  Temp: (!) 97.5 F (36.4 C)  SpO2: 100%   Filed Weights   07/06/20 0842  Weight: 152 lb 8 oz (69.2 kg)      LABORATORY DATA:  I have reviewed the data as listed Lab Results  Component Value Date   WBC 6.4 07/06/2020   HGB 13.4 07/06/2020   HCT 42.3 07/06/2020   MCV 93.4 07/06/2020   PLT 273 07/06/2020   Lab Results  Component Value Date   NA 140 07/06/2020   K 4.5 07/06/2020   CL 106 07/06/2020   CO2 26 07/06/2020    RADIOGRAPHIC STUDIES: I have personally reviewed the radiological reports and agreed with the findings in the report.  ASSESSMENT AND PLAN:  Malignant neoplasm of lower-outer quadrant of right breast of female, estrogen  receptor positive ([place]) 07/01/2020:Screening mammogram showed a right breast mass. Mammogram and US showed a 0.7cm mass at the 6 o'clock position in the right breast, no axillary adenopathy. 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  T1BN0 stage Ia  Pathology and radiology counseling: Discussed with the patient, the details of pathology including the type of breast cancer,the clinical staging, the significance of ER, PR and HER-2/neu receptors and the implications for treatment. After reviewing the pathology in detail, we proceeded to discuss the different treatment options between surgery, radiation, chemotherapy, antiestrogen therapies.  Treatment plan: 1.  Breast conserving surgery with sentinel lymph node biopsy 2. adjuvant Taxol Herceptin of the final tumor size is 6 mm or greater 3.  Adjuvant radiation therapy 4.  Follow-up adjuvant antiestrogen therapy.  Chemo counseling: I discussed risks and benefits of Taxol and Herceptin in great detail. Because of her cardiac history we are nervous about initiating Herceptin. She looks amazing clinically but apparently in the past she had an EF of 15%.  She sees Dr. Haroldine Laws with cardiology. Will need cardiac clearance for surgery and we will also look forward to his direction regarding anti-HER-2 therapy.  Return to clinic after surgery to discuss the final pathology report and then determine if the patient will require adjuvant Taxol and Herceptin.   All questions were answered. The patient knows to call the clinic with any problems, questions or concerns.   Rulon Eisenmenger, MD, MPH 07/06/2020    I, Molly Dorshimer, am acting as scribe for Nicholas Lose, MD.  I have reviewed the above documentation for accuracy and completeness, and I agree with the above.

## 2020-07-06 ENCOUNTER — Inpatient Hospital Stay (HOSPITAL_BASED_OUTPATIENT_CLINIC_OR_DEPARTMENT_OTHER): Payer: HMO | Admitting: Genetic Counselor

## 2020-07-06 ENCOUNTER — Encounter: Payer: Self-pay | Admitting: *Deleted

## 2020-07-06 ENCOUNTER — Inpatient Hospital Stay: Payer: HMO

## 2020-07-06 ENCOUNTER — Inpatient Hospital Stay: Payer: HMO | Attending: Hematology and Oncology | Admitting: Hematology and Oncology

## 2020-07-06 ENCOUNTER — Encounter: Payer: Self-pay | Admitting: Hematology and Oncology

## 2020-07-06 ENCOUNTER — Ambulatory Visit
Admission: RE | Admit: 2020-07-06 | Discharge: 2020-07-06 | Disposition: A | Discharge status: Home / Self Care | Payer: No Typology Code available for payment source | Source: Ambulatory Visit | Attending: Radiation Oncology | Admitting: Radiation Oncology

## 2020-07-06 ENCOUNTER — Encounter: Payer: Self-pay | Admitting: Genetic Counselor

## 2020-07-06 ENCOUNTER — Inpatient Hospital Stay: Payer: HMO | Admitting: Licensed Clinical Social Worker

## 2020-07-06 ENCOUNTER — Encounter: Payer: Self-pay | Admitting: Physical Therapy

## 2020-07-06 ENCOUNTER — Other Ambulatory Visit: Payer: Self-pay

## 2020-07-06 ENCOUNTER — Ambulatory Visit: Payer: HMO | Admitting: Physical Therapy

## 2020-07-06 ENCOUNTER — Other Ambulatory Visit: Payer: Self-pay | Admitting: Surgery

## 2020-07-06 DIAGNOSIS — C50511 Malignant neoplasm of lower-outer quadrant of right female breast: Secondary | ICD-10-CM

## 2020-07-06 DIAGNOSIS — Z79899 Other long term (current) drug therapy: Secondary | ICD-10-CM | POA: Diagnosis not present

## 2020-07-06 DIAGNOSIS — R293 Abnormal posture: Secondary | ICD-10-CM | POA: Insufficient documentation

## 2020-07-06 DIAGNOSIS — I509 Heart failure, unspecified: Secondary | ICD-10-CM | POA: Insufficient documentation

## 2020-07-06 DIAGNOSIS — Z8042 Family history of malignant neoplasm of prostate: Secondary | ICD-10-CM | POA: Diagnosis not present

## 2020-07-06 DIAGNOSIS — Z170 Estrogen receptor positive status [ER+]: Secondary | ICD-10-CM

## 2020-07-06 DIAGNOSIS — I110 Hypertensive heart disease with heart failure: Secondary | ICD-10-CM | POA: Insufficient documentation

## 2020-07-06 DIAGNOSIS — C50512 Malignant neoplasm of lower-outer quadrant of left female breast: Secondary | ICD-10-CM

## 2020-07-06 DIAGNOSIS — Z171 Estrogen receptor negative status [ER-]: Secondary | ICD-10-CM | POA: Insufficient documentation

## 2020-07-06 DIAGNOSIS — Z808 Family history of malignant neoplasm of other organs or systems: Secondary | ICD-10-CM

## 2020-07-06 DIAGNOSIS — Z803 Family history of malignant neoplasm of breast: Secondary | ICD-10-CM | POA: Insufficient documentation

## 2020-07-06 DIAGNOSIS — E119 Type 2 diabetes mellitus without complications: Secondary | ICD-10-CM | POA: Diagnosis not present

## 2020-07-06 DIAGNOSIS — Z7982 Long term (current) use of aspirin: Secondary | ICD-10-CM | POA: Diagnosis not present

## 2020-07-06 DIAGNOSIS — Z794 Long term (current) use of insulin: Secondary | ICD-10-CM | POA: Insufficient documentation

## 2020-07-06 DIAGNOSIS — Z853 Personal history of malignant neoplasm of breast: Secondary | ICD-10-CM

## 2020-07-06 LAB — CMP (CANCER CENTER ONLY)
ALT: 12 U/L (ref 0–44)
AST: 13 U/L — ABNORMAL LOW (ref 15–41)
Albumin: 3.5 g/dL (ref 3.5–5.0)
Alkaline Phosphatase: 62 U/L (ref 38–126)
Anion gap: 8 (ref 5–15)
BUN: 18 mg/dL (ref 8–23)
CO2: 26 mmol/L (ref 22–32)
Calcium: 10 mg/dL (ref 8.9–10.3)
Chloride: 106 mmol/L (ref 98–111)
Creatinine: 0.82 mg/dL (ref 0.44–1.00)
GFR, Est AFR Am: >60 mL/min (ref >60)
GFR, Estimated: >60 mL/min (ref >60)
Glucose, Bld: 173 mg/dL — ABNORMAL HIGH (ref 70–99)
Potassium: 4.5 mmol/L (ref 3.5–5.1)
Sodium: 140 mmol/L (ref 135–145)
Total Bilirubin: <0.2 mg/dL — ABNORMAL LOW (ref 0.3–1.2)
Total Protein: 7.3 g/dL (ref 6.5–8.1)

## 2020-07-06 LAB — CBC WITH DIFFERENTIAL (CANCER CENTER ONLY)
Abs Immature Granulocytes: 0.02 10*3/uL (ref 0.00–0.07)
Basophils Absolute: 0 10*3/uL (ref 0.0–0.1)
Basophils Relative: 1 %
Eosinophils Absolute: 0.2 10*3/uL (ref 0.0–0.5)
Eosinophils Relative: 4 %
HCT: 42.3 % (ref 36.0–46.0)
Hemoglobin: 13.4 g/dL (ref 12.0–15.0)
Immature Granulocytes: 0 %
Lymphocytes Relative: 36 %
Lymphs Abs: 2.3 10*3/uL (ref 0.7–4.0)
MCH: 29.6 pg (ref 26.0–34.0)
MCHC: 31.7 g/dL (ref 30.0–36.0)
MCV: 93.4 fL (ref 80.0–100.0)
Monocytes Absolute: 0.4 10*3/uL (ref 0.1–1.0)
Monocytes Relative: 6 %
Neutro Abs: 3.4 10*3/uL (ref 1.7–7.7)
Neutrophils Relative %: 53 %
Platelet Count: 273 10*3/uL (ref 150–400)
RBC: 4.53 MIL/uL (ref 3.87–5.11)
RDW: 13.2 % (ref 11.5–15.5)
WBC Count: 6.4 10*3/uL (ref 4.0–10.5)
nRBC: 0 % (ref 0.0–0.2)

## 2020-07-06 LAB — GENETIC SCREENING ORDER

## 2020-07-06 NOTE — Progress Notes (Signed)
Patient requested to speak with me.  Met with patient at registration and provided my card. She wanted to discuss financial assistance.  Advised once her treatment plan is in place, I will be reaching out to her to discuss available resources and what is needed to apply. She verbalized understanding.  She has my card for any additional financial questions or concerns.

## 2020-07-06 NOTE — Progress Notes (Deleted)
Radiation Oncology         ([phone]) [phone] ________________________________  Name: Toni [name]        MRN: [id number]  Date of Service: 07/06/2020 DOB: [birth year]  CC:Sun, Gari Crown, MD  Coralie Keens, MD     REFERRING PHYSICIAN: Coralie Keens, MD   DIAGNOSIS: The encounter diagnosis was Malignant neoplasm of lower-outer quadrant of left breast of female, estrogen receptor positive ([place]).   HISTORY OF PRESENT ILLNESS: Toni [name] is a 64 y.o. female seen in the multidisciplinary breast clinic for a new diagnosis of left breast cancer. The patient was noted to have a screening deteceted mass in the right breast. There was a mass seen at 6:00 measuring 7 x 4 x4 mm. Her axilla was negative for adenopathy. 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 She is seen today to discuss treatment recommendations for her cancer.    PREVIOUS RADIATION THERAPY: No   PAST MEDICAL HISTORY:  Past Medical History:  Diagnosis Date  . CHF (congestive heart failure) ([place])   . Diabetes mellitus without complication ([place])   . Hypertension        PAST SURGICAL HISTORY: Past Surgical History:  Procedure Laterality Date  . ABDOMINAL AORTIC ANEURYSM REPAIR    . ABDOMINAL HYSTERECTOMY    . CARDIAC CATHETERIZATION     in [facility], clean, per pt.  . ICD IMPLANT N/A 06/18/2019   Procedure: ICD IMPLANT;  Surgeon: Evans Lance, MD;  Location: [place] [facility];  Service: Cardiovascular;  Laterality: N/A;  . RIGHT/LEFT HEART CATH AND CORONARY ANGIOGRAPHY N/A 11/17/2018   Procedure: RIGHT/LEFT HEART CATH AND CORONARY ANGIOGRAPHY;  Surgeon: Belva Crome, MD;  Location: [place] [facility];  Service: Cardiovascular;  Laterality: N/A;     FAMILY HISTORY:  Family History  Problem Relation Age of Onset  . Diabetes Mellitus I Mother   . Lung cancer Mother   . Breast cancer Mother   . Diabetes Mellitus I Father   . Sudden death  Father 10  . Hypertension Brother   . Hypertension Brother   . Diabetes Mellitus I Brother   . Benign prostatic hyperplasia Brother   . Heart failure Paternal Uncle      SOCIAL HISTORY:  reports that she has never smoked. She has never used smokeless tobacco. She reports current alcohol use. She reports that she does not use drugs. The patient is single. She lives in [place]. She is retired from working at [employer] in [employer]. She is accompanied by her friend Vickie. She enjoys going to yard sales.   ALLERGIES: Biaxin [clarithromycin], Enalapril maleate, and Vasotec [enalapril]   MEDICATIONS:  Current Outpatient Medications  Medication Sig Dispense Refill  . aspirin 81 MG chewable tablet Chew 1 tablet (81 mg total) by mouth daily. 30 tablet 0  . carvedilol (COREG) 25 MG tablet Take 1 tablet (25 mg total) by mouth 2 (two) times daily with a meal. 60 tablet 5  . cetirizine (ZYRTEC) 10 MG tablet Take 10 mg by mouth daily. (Patient not taking: Reported on 05/31/2020)    . dapagliflozin propanediol (FARXIGA) 10 MG TABS tablet Take 10 mg by mouth daily.    . digoxin (LANOXIN) 0.25 MG tablet Take 0.0625 mg by mouth daily.    . furosemide (LASIX) 20 MG tablet Take 20 mg by mouth as needed.    Marland Kitchen glipiZIDE (GLUCOTROL XL) 10 MG 24 hr tablet Take 10 mg  by mouth 2 (two) times daily with a meal.     . hydrALAZINE (APRESOLINE) 50 MG tablet TAKE 1 TABLET (50 MG TOTAL) BY MOUTH 3 (THREE) TIMES DAILY. 270 tablet 1  . isosorbide dinitrate (ISORDIL) 20 MG tablet TAKE 1 TABLET (20 MG TOTAL) BY MOUTH 3 TIMES DAILY. 90 tablet 11  . LINZESS 145 MCG CAPS capsule Take 145 mcg by mouth daily.    Marland Kitchen liraglutide (VICTOZA) 18 MG/3ML SOPN Inject 1.8 mg into the skin every evening.    Marland Kitchen losartan (COZAAR) 25 MG tablet TAKE 1 TABLET BY MOUTH 2 TIMES A DAY. 180 tablet 3  . metFORMIN (GLUCOPHAGE) 1000 MG tablet Take 1,000 mg by mouth 2 (two) times daily with a meal.    . Multiple Vitamin (MULTIVITAMIN WITH  MINERALS) TABS tablet Take 1 tablet by mouth daily.    . sertraline (ZOLOFT) 50 MG tablet Take 50 mg by mouth daily.    . simvastatin (ZOCOR) 20 MG tablet Take 20 mg by mouth every evening.    Marland Kitchen spironolactone (ALDACTONE) 25 MG tablet TAKE 1 TABLET BY MOUTH EVERY EVENING. 90 tablet 3  . TRUE METRIX BLOOD GLUCOSE TEST test strip 1 each by Other route as directed.   5  . TRUEPLUS LANCETS 30G MISC 1 each by Other route as directed. Use as directed.  5  . UNIFINE PENTIPS 32G X 4 MM MISC 1 each by Other route as directed.      No current facility-administered medications for this encounter.     REVIEW OF SYSTEMS: On review of systems, the patient reports that she is doing well overall. She denies any specific complaints today with the breast.    PHYSICAL EXAM:  Wt Readings from Last 3 Encounters:  05/31/20 141 lb (64 kg)  03/29/20 143 lb 6.4 oz (65 kg)  02/22/20 142 lb 3.2 oz (64.5 kg)   Temp Readings from Last 3 Encounters:  05/04/20 98 F (36.7 C)  12/11/19 98.5 F (36.9 C)  11/03/19 (!) 97.3 F (36.3 C) (Temporal)   BP Readings from Last 3 Encounters:  05/31/20 110/72  05/04/20 116/69  03/29/20 (!) 92/54   Pulse Readings from Last 3 Encounters:  05/31/20 76  05/04/20 74  03/29/20 79    In general this is a well appearing African American female in no acute distress. She's alert and oriented x4 and appropriate throughout the examination. Cardiopulmonary assessment is negative for acute distress and she exhibits normal effort. Bilateral breast exam is deferred.    ECOG = 0  0 - Asymptomatic (Fully active, able to carry on all predisease activities without restriction)  1 - Symptomatic but completely ambulatory (Restricted in physically strenuous activity but ambulatory and able to carry out work of a light or sedentary nature. For example, light housework, office work)  2 - Symptomatic, <50% in bed during the day (Ambulatory and capable of all self care but unable to  carry out any work activities. Up and about more than 50% of waking hours)  3 - Symptomatic, >50% in bed, but not bedbound (Capable of only limited self-care, confined to bed or chair 50% or more of waking hours)  4 - Bedbound (Completely disabled. Cannot carry on any self-care. Totally confined to bed or chair)  5 - Death   Eustace Pen MM, Creech RH, Tormey DC, et al. [phone]). "Toxicity and response criteria of the [facility]". [place] Oncol. 5 (6): 649-55    LABORATORY DATA:  Lab Results  Component Value Date   WBC 9.7 05/04/2020   HGB 14.0 05/04/2020   HCT 45.0 05/04/2020   MCV 94.1 05/04/2020   PLT 280 05/04/2020   Lab Results  Component Value Date   NA 137 05/31/2020   K 4.8 05/31/2020   CL 102 05/31/2020   CO2 26 05/31/2020   Lab Results  Component Value Date   ALT 19 11/18/2018   AST 18 11/18/2018   ALKPHOS 43 11/18/2018   BILITOT 0.8 11/18/2018      RADIOGRAPHY: US BREAST LTD UNI RIGHT INC AXILLA  Result Date: 06/27/2020 CLINICAL DATA:  Patient returns after screening study for evaluation possible RIGHT breast mass. EXAM: DIGITAL DIAGNOSTIC RIGHT MAMMOGRAM WITH CAD AND TOMO ULTRASOUND RIGHT BREAST COMPARISON:  06/10/2020 ACR Breast Density Category b: There are scattered areas of fibroglandular density. FINDINGS: Additional 2-D and 3-D images are performed. These views confirm presence of an oval mass with indistinct margins in the LOWER central portion of the RIGHT breast. Mammographic images were processed with CAD. Targeted ultrasound is performed, showing an oval mass with indistinct and angular margins in the 6 o'clock location RIGHT breast 1 centimeter from the nipple. There is associated blood flow along the periphery of the lesion. Mass measures 0.7 x 0.4 x 0.4 centimeters. Evaluation of the RIGHT axilla is negative for adenopathy. IMPRESSION: Indeterminate mass 6 o'clock location of the RIGHT breast. No RIGHT axillary adenopathy.  RECOMMENDATION: Recommend ultrasound-guided core biopsy of RIGHT breast mass. I have discussed the findings and recommendations with the patient. If applicable, a reminder letter will be sent to the patient regarding the next appointment. BI-RADS CATEGORY  4: Suspicious. Electronically Signed   By: Nolon Nations M.D.   On: 06/27/2020 11:17   MM DIAG BREAST TOMO UNI RIGHT  Result Date: 06/27/2020 CLINICAL DATA:  Patient returns after screening study for evaluation possible RIGHT breast mass. EXAM: DIGITAL DIAGNOSTIC RIGHT MAMMOGRAM WITH CAD AND TOMO ULTRASOUND RIGHT BREAST COMPARISON:  06/10/2020 ACR Breast Density Category b: There are scattered areas of fibroglandular density. FINDINGS: Additional 2-D and 3-D images are performed. These views confirm presence of an oval mass with indistinct margins in the LOWER central portion of the RIGHT breast. Mammographic images were processed with CAD. Targeted ultrasound is performed, showing an oval mass with indistinct and angular margins in the 6 o'clock location RIGHT breast 1 centimeter from the nipple. There is associated blood flow along the periphery of the lesion. Mass measures 0.7 x 0.4 x 0.4 centimeters. Evaluation of the RIGHT axilla is negative for adenopathy. IMPRESSION: Indeterminate mass 6 o'clock location of the RIGHT breast. No RIGHT axillary adenopathy. RECOMMENDATION: Recommend ultrasound-guided core biopsy of RIGHT breast mass. I have discussed the findings and recommendations with the patient. If applicable, a reminder letter will be sent to the patient regarding the next appointment. BI-RADS CATEGORY  4: Suspicious. Electronically Signed   By: Nolon Nations M.D.   On: 06/27/2020 11:17   MM 3D SCREEN BREAST BILATERAL  Result Date: 06/14/2020 CLINICAL DATA:  Screening. EXAM: DIGITAL SCREENING BILATERAL MAMMOGRAM WITH TOMO AND CAD COMPARISON:  Previous exam(s). ACR Breast Density Category b: There are scattered areas of fibroglandular  density. FINDINGS: In the right breast, a possible mass warrants further evaluation. In the left breast, no findings suspicious for malignancy. Images were processed with CAD. IMPRESSION: Further evaluation is suggested for possible mass in the right breast. RECOMMENDATION: Diagnostic mammogram and possibly ultrasound of the right breast. (Code:[id number]-[id number]) The patient will be contacted regarding the  findings, and additional imaging will be scheduled. BI-RADS CATEGORY  0: Incomplete. Need additional imaging evaluation and/or prior mammograms for comparison. Electronically Signed   By: Evangeline Dakin M.D.   On: 06/14/2020 14:44   CUP PACEART REMOTE DEVICE CHECK  Result Date: 07/03/2020 Scheduled [place] icd transmission.  Normal device function. HL index 9.  Night HR 100 bpm. S[place]  MM CLIP PLACEMENT RIGHT  Result Date: 06/28/2020 CLINICAL DATA:  Patient status post ultrasound-guided biopsy right breast mass 6 o'clock position. EXAM: DIAGNOSTIC RIGHT MAMMOGRAM POST ULTRASOUND BIOPSY COMPARISON:  Previous exam(s). FINDINGS: Mammographic images were obtained following ultrasound guided biopsy of right breast mass 6 o'clock position. The biopsy marking clip is in expected position at the site of biopsy. IMPRESSION: Appropriate positioning of the ribbon shaped biopsy marking clip at the site of biopsy in the right breast mass 6 o'clock position. Final Assessment: Post Procedure Mammograms for Marker Placement Electronically Signed   By: Lovey Newcomer M.D.   On: 06/28/2020 11:13   [place] RT BREAST BX W LOC DEV 1ST LESION IMG BX SPEC US GUIDE  Addendum Date: 06/30/2020   ADDENDUM REPORT: 06/29/2020 11:31 ADDENDUM: Pathology revealed GRADE III INVASIVE MAMMARY CARCINOMA of the Right breast, 6 o'clock. This was found to be concordant by Dr. Lovey Newcomer. Pathology results were discussed with the patient by telephone. The patient reported doing well after the biopsy with tenderness at the site. Post biopsy instructions and  care were reviewed and questions were answered. The patient was encouraged to call [facility] for any additional concerns. My direct phone number was provided. The patient was referred to [facility] on July 06, 2020. Pathology results reported by Terie Purser, RN on 06/29/2020. Electronically Signed   By: Lovey Newcomer M.D.   On: 06/29/2020 11:31   Result Date: 06/30/2020 CLINICAL DATA:  Patient with indeterminate right breast mass 6 o'clock position. EXAM: ULTRASOUND GUIDED RIGHT BREAST CORE NEEDLE BIOPSY COMPARISON:  Previous exam(s). PROCEDURE: I met with the patient and we discussed the procedure of ultrasound-guided biopsy, including benefits and alternatives. We discussed the high likelihood of a successful procedure. We discussed the risks of the procedure, including infection, bleeding, tissue injury, clip migration, and inadequate sampling. Informed written consent was given. The usual time-out protocol was performed immediately prior to the procedure. Lesion quadrant: Lower outer quadrant Using sterile technique and 1% Lidocaine as local anesthetic, under direct ultrasound visualization, a 14 gauge spring-loaded device was used to perform biopsy of right breast mass 6 o'clock position using a lateral approach. At the conclusion of the procedure ribbon shaped tissue marker clip was deployed into the biopsy cavity. Follow up 2 view mammogram was performed and dictated separately. IMPRESSION: Ultrasound guided biopsy of right breast mass 6 o'clock position. No apparent complications. Electronically Signed: By: Lovey Newcomer M.D. On: 06/28/2020 11:12       IMPRESSION/PLAN: 1. 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 Dr. Lisbeth Renshaw discusses the pathology findings and reviews the nature of right breast disease. The consensus from the breast  conference includes breast conservation with lumpectomy with sentinel node biopsy. Dr. Lindi Adie recommends adjuvant systemic chemotherapy and Herceptin if her tumor is greater than 5 mm. She is also offered external radiotherapy to the breast followed by antiestrogen therapy. We discussed the risks, benefits, short, and long term effects of radiotherapy, and the patient is interested  in proceeding. Dr. Lisbeth Renshaw discusses the delivery and logistics of radiotherapy and anticipates a course of 4-6 1/2 weeks of radiotherapy. We will see her back about 2 weeks after surgery to discuss the simulation process and anticipate we starting radiotherapy about 4-6 weeks after surgery.  2. Possible genetic predisposition to malignancy. The patient is a candidate for genetic testing given her personal and family history. She was offered referral and will meet with genetic counseling today.   In a visit lasting 45 minutes, greater than 50% of the time was spent face to face reviewing her case, as well as in preparation of, discussing, and coordinating the patient's care.  The above documentation reflects my direct findings during this shared patient visit. Please see the separate note by Dr. Lisbeth Renshaw on this date for the remainder of the patient's plan of care.    Carola Rhine, PAC

## 2020-07-06 NOTE — Assessment & Plan Note (Signed)
07/01/2020:Screening mammogram showed a right breast mass. Mammogram and US showed a 0.7cm mass at the 6 o'clock position in the right breast, no axillary adenopathy. 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  T1BN0 stage Ia  Pathology and radiology counseling: Discussed with the patient, the details of pathology including the type of breast cancer,the clinical staging, the significance of ER, PR and HER-2/neu receptors and the implications for treatment. After reviewing the pathology in detail, we proceeded to discuss the different treatment options between surgery, radiation, chemotherapy, antiestrogen therapies.  Treatment plan: 1.  Breast conserving surgery with sentinel lymph node biopsy 2. adjuvant Taxol Herceptin of the final tumor size is 6 mm or greater 3.  Adjuvant radiation therapy 4.  Follow-up adjuvant antiestrogen therapy.  Chemo counseling: I discussed risks and benefits of Taxol and Herceptin in great detail. Because of her cardiac history we are nervous about initiating Herceptin. She looks amazing clinically but apparently in the past she had an EF of 15%.  She sees Dr. Haroldine Laws with cardiology. Will need cardiac clearance for surgery and we will also look forward to his direction regarding anti-HER-2 therapy.  Return to clinic after surgery to discuss the final pathology report and then determine if the patient will require adjuvant Taxol and Herceptin.

## 2020-07-06 NOTE — Progress Notes (Signed)
REFERRING PROVIDER: Nicholas Lose, MD [place],  [place] [address]  PRIMARY PROVIDER:  Donald Prose, MD  PRIMARY REASON FOR VISIT:  1. Malignant neoplasm of lower-outer quadrant of right breast of female, estrogen receptor positive ([place])   2. Family history of breast cancer   3. Family history of prostate cancer   4. Family history of thyroid cancer      HISTORY OF PRESENT ILLNESS:   Ms. Gillentine, a 64 y.o. female, was seen for a [facility] cancer genetics consultation at the request of Dr. Lindi Adie due to a personal and family history of cancer.  Ms. Grandmaison presents to clinic today to discuss the possibility of a hereditary predisposition to cancer, genetic testing, and to further clarify her future cancer risks, as well as potential cancer risks for family members.   In August 2021, at the age of 30, Ms. Rupard was diagnosed with invasive ductal carcinoma, ER+/PR-/Her2+, of the right breast. The treatment plan includes surgery, adjuvant chemotherapy, adjuvant radiation therapy, and adjuvant antiestrogen therapy.    CANCER HISTORY:  Oncology History  Malignant neoplasm of lower-outer quadrant of right breast of female, estrogen receptor positive ([place])  07/01/2020 Initial Diagnosis   Screening mammogram showed a right breast mass. Mammogram and US showed a 0.7cm mass at the 6 o'clock position in the right breast, no axillary adenopathy. 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    07/06/2020 Cancer Staging   Staging form: Breast, AJCC 8th Edition - Clinical stage from 07/06/2020: Stage IB (cT1b, cN0, cM0, G3, ER+, PR-, HER2-) - Signed by Nicholas Lose, MD on 07/06/2020      RISK FACTORS:  Menarche was at age 22.  No live births.  OCP use for unknown years.  Ovaries intact: yes.  Hysterectomy: yes.  Menopausal status: postmenopausal.  HRT use: 0 years. Colonoscopy: yes;  normal. Mammogram within the last year: yes.   Past Medical History:  Diagnosis Date  . CHF (congestive heart failure) ([place])   . Diabetes mellitus without complication ([place])   . Family history of breast cancer   . Family history of prostate cancer   . Family history of thyroid cancer   . Hypertension     Past Surgical History:  Procedure Laterality Date  . ABDOMINAL AORTIC ANEURYSM REPAIR    . ABDOMINAL HYSTERECTOMY    . CARDIAC CATHETERIZATION     in [facility], clean, per pt.  . ICD IMPLANT N/A 06/18/2019   Procedure: ICD IMPLANT;  Surgeon: Evans Lance, MD;  Location: [facility];  Service: Cardiovascular;  Laterality: N/A;  . RIGHT/LEFT HEART CATH AND CORONARY ANGIOGRAPHY N/A 11/17/2018   Procedure: RIGHT/LEFT HEART CATH AND CORONARY ANGIOGRAPHY;  Surgeon: Belva Crome, MD;  Location: [facility];  Service: Cardiovascular;  Laterality: N/A;    Social History   Socioeconomic History  . Marital status: Single    Spouse name: Not on file  . Number of children: Not on file  . Years of education: 12  . Highest education level: Bachelor's degree (e.g., BA, AB, BS)  Occupational History  . Occupation: Surveyor, quantity: [employer]  Tobacco Use  . Smoking status: Never Smoker  . Smokeless tobacco: Never Used  Vaping Use  . Vaping Use: Never used  Substance and Sexual Activity  . Alcohol use: Yes    Alcohol/week: 0.0 standard drinks  Comment: less than once a month  . Drug use: No  . Sexual activity: Never  Other Topics Concern  . Not on file  Social History Narrative   Works at [employer].  Lives alone.     Social Determinants of Health   Financial Resource Strain: Low Risk   . Difficulty of Paying Living Expenses: Not hard at all  Food Insecurity: No Food Insecurity  . Worried About Charity fundraiser in the Last Year: Never true  . Ran Out of Food in the Last Year: Never true  Transportation Needs: No Transportation Needs  .  Lack of Transportation (Medical): No  . Lack of Transportation (Non-Medical): No  Physical Activity: Inactive  . Days of Exercise per Week: 0 days  . Minutes of Exercise per Session: 0 min  Stress: Stress Concern Present  . Feeling of Stress : Very much  Social Connections:   . Frequency of Communication with Friends and Family:   . Frequency of Social Gatherings with Friends and Family:   . Attends Religious Services:   . Active Member of Clubs or Organizations:   . Attends Archivist Meetings:   Marland Kitchen Marital Status:      FAMILY HISTORY:  We obtained a detailed, 4-generation family history.  Significant diagnoses are listed below: Family History  Problem Relation Age of Onset  . Diabetes Mellitus I Mother   . Lung cancer Mother 45  . Breast cancer Mother        dx. late 30s/early 38s  . Diabetes Mellitus I Father   . Sudden death Father 34  . Prostate cancer Brother 52  . Hypertension Brother   . Hypertension Brother   . Diabetes Mellitus I Brother   . Benign prostatic hyperplasia Brother   . Heart failure Paternal Uncle   . Thyroid cancer Niece        dx. in her 6s  . Breast cancer Cousin        dx. in her 25s, recurrence in her 71s (maternal first cousin)  . Breast cancer Cousin        female dx. in his early 79s (paternal first cousin)  . Cancer Cousin        dx. in his early 62s, unknown type (paternal first cousin)  . Cancer Cousin 60       unknown type (paternal first cousin)    Ms. Ackroyd does not have children. She has two full-brothers and two paternal half-brothers. Her oldest full-brother had prostate cancer diagnosed when he was 16. This brother's daughter had thyroid cancer, diagnosed in her early 63s.   Ms. Zinda mother died at the age of 12 and had a history of breast cancer diagnosed in her late 73s or early 38s, as well as lung cancer diagnosed when she was 84. She notes that her mother was not a smoker. Ms. Agent had three maternal aunts and six  maternal uncles, none of whom had cancer to her knowledge. She has one maternal first cousin who was diagnosed with breast cancer in her 5s, then again in her 67s in the same breast. Ms. Ramey is not sure if this cousin has had genetic testing. Her maternal grandmother died in her 40s, and she has limited information about her maternal grandfather. There are no other known diagnoses of cancer on the maternal side of the family.  Ms. Cogbill's father died at the age of 65 and did not have cancer. Her father had two full-brothers, four  full-sisters, one paternal half-sister, and two paternal half-brothers. None of these aunts/uncles have had cancer to Ms. Schifano's knowledge. Three paternal first cousins have had cancer - one had female breast cancer diagnosed in his early 66s, the second had an unknown cancer diagnosed in his early 24s, and the third died from an unknown cancer at the age of 75. Ms. Haymore's paternal grandmother died in her late 69s and her paternal grandfather died at the age of 84. There are no other known diagnoses of cancer on the paternal side of the family.  Ms. Rougeau is Governor [place] of previous family history of genetic testing for hereditary cancer risks. Patient's maternal ancestors are of unknown descent, and paternal ancestors are of unknown descent. There is no reported Ashkenazi Jewish ancestry. There is no known consanguinity.  GENETIC COUNSELING ASSESSMENT: Ms. Abeln is a 64 y.o. female with a personal history of breast cancer and a family history of young-onset breast cancer, female breast cancer, and prostate cancer, which is somewhat suggestive of a hereditary cancer syndrome and predisposition to cancer. We, therefore, discussed and recommended the following at today's visit.   DISCUSSION: We discussed that approximately 5-10% of breast cancer is hereditary, with most cases associated with the BRCA1 and BRCA2 genes. There are other genes that can be associated with hereditary breast  cancer syndromes. These include ATM, CHEK2, PALB2, etc. We discussed that testing is beneficial for several reasons, including knowing about other cancer risks, identifying potential screening and risk-reduction options that may be appropriate, and to understand if other family members could be at risk for cancer and allow them to undergo genetic testing. Ms. Sterling stated that the results of the genetic test would not impact her surgery decision.  We reviewed the characteristics, features and inheritance patterns of hereditary cancer syndromes. We also discussed genetic testing, including the appropriate family members to test, the process of testing, insurance coverage and turn-around-time for results. We discussed the implications of a negative, positive and/or variant of uncertain significant result. We recommended Ms. Ocasio pursue genetic testing for the [facility] Common Hereditary Cancers panel.   The Common Hereditary Cancers Panel offered by [facility] includes sequencing and/or deletion duplication testing of the following 48 genes: APC, ATM, AXIN2, BARD1, BMPR1A, BRCA1, BRCA2, BRIP1, CDH1, CDK4, CDKN2A (p14ARF), CDKN2A (p16INK4a), CHEK2, CTNNA1, DICER1, EPCAM (Deletion/duplication testing only), GREM1 (promoter region deletion/duplication testing only), KIT, MEN1, MLH1, MSH2, MSH3, MSH6, MUTYH, NBN, NF1, NTHL1, PALB2, PDGFRA, PMS2, POLD1, POLE, PTEN, RAD50, RAD51C, RAD51D, RNF43, SDHB, SDHC, SDHD, SMAD4, SMARCA4. STK11, TP53, TSC1, TSC2, and VHL.  The following genes are evaluated for sequence changes only: SDHA and HOXB13 c.251G>A variant only.  Based on Ms. Simpson's personal and family history of cancer, she meets medical criteria for genetic testing. Despite that she meets criteria, she may still have an out of pocket cost. We discussed that if her out of pocket cost for testing is over $100, the laboratory will reach out to let her know. If the out of pocket cost of testing is less than $100 she will be  billed by the genetic testing laboratory.   PLAN: After considering the risks, benefits, and limitations, Ms. Mairena provided informed consent to pursue genetic testing and the blood sample was sent to [facility] for analysis of the Common Hereditary Cancers Panel. Results should be available within approximately two-three weeks' time, at which point they will be disclosed by telephone to Ms. Mill, as will any additional recommendations warranted by these results. Ms. Pewitt will  receive a summary of her genetic counseling visit and a copy of her results once available. This information will also be available in Epic.   Ms. Cancio's questions were answered to her satisfaction today. Our contact information was provided should additional questions or concerns arise. Thank you for the referral and allowing [place] to share in the care of your patient.   Clint Guy, [place], [facility] Licensed, Certified Dispensing [email or web address] Phone: [phone]  The patient was seen for a total of 25 minutes in face-to-face genetic counseling.  This patient was discussed with Drs. Magrinat, Lindi Adie and/or Burr Medico who agrees with the above.    _______________________________________________________________________ For Office Staff:  Number of people involved in session: 1 Was an Intern/ student involved with case: no

## 2020-07-06 NOTE — Therapy (Signed)
[place], [place], [address] Phone: [phone]   Fax:  [phone]  Physical Therapy Evaluation  Patient Details  Name: Toni [name] MRN: [id number] Date of Birth: Jan 06, 1956 Referring Provider (PT): Dr. Coralie Keens   Encounter Date: 07/06/2020   PT End of Session - 07/06/20 1053    Visit Number 1    Number of Visits 2    Date for PT Re-Evaluation 08/31/20    PT Start Time 0953    PT Stop Time 1015    PT Time Calculation (min) 22 min    Activity Tolerance Patient tolerated treatment well    Behavior During Therapy [facility] for tasks assessed/performed           Past Medical History:  Diagnosis Date  . CHF (congestive heart failure) ([place])   . Diabetes mellitus without complication ([place])   . Hypertension     Past Surgical History:  Procedure Laterality Date  . ABDOMINAL AORTIC ANEURYSM REPAIR    . ABDOMINAL HYSTERECTOMY    . CARDIAC CATHETERIZATION     in [facility], clean, per pt.  . ICD IMPLANT N/A 06/18/2019   Procedure: ICD IMPLANT;  Surgeon: Evans Lance, MD;  Location: [facility];  Service: Cardiovascular;  Laterality: N/A;  . RIGHT/LEFT HEART CATH AND CORONARY ANGIOGRAPHY N/A 11/17/2018   Procedure: RIGHT/LEFT HEART CATH AND CORONARY ANGIOGRAPHY;  Surgeon: Belva Crome, MD;  Location: [facility];  Service: Cardiovascular;  Laterality: N/A;    There were no vitals filed for this visit.    Subjective Assessment - 07/06/20 1044    Subjective Patient reports she is here today to be seen by her medical team for her newly diagnosed right breast cancer.    Patient is accompained by: --   Friend   Pertinent History Patient was diagnosed on 06/10/2020 with right grade III invasive ductal carcinoma breast cancer. It measures 7 mm and is located in the lower outer quadrant. It is ER/PR negative and HER2 positive with a Ki67 of 40%. She also has a history of heart  failure and has an implanted defibrillator. 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    Patient Stated Goals Reduce lymphedema risk and learn post op shoulder ROM HEP    Currently in Pain? No/denies              [facility] PT Assessment - 07/06/20 0001      Assessment   Medical Diagnosis Right breast cancer    Referring Provider (PT) Dr. Coralie Keens    Onset Date/Surgical Date 06/10/20    Hand Dominance Right    Prior Therapy none      Precautions   Precautions Other (comment)    Precaution Comments active cancer      Restrictions   Weight Bearing Restrictions No      Balance Screen   Has the patient fallen in the past 6 months Yes    How many times? 1   Passed out due to dehydration on 05/04/2020   Has the patient had a decrease in activity level because of a fear of falling?  No    Is the patient reluctant to leave their home because of a fear of falling?  No      Home Environment   Living Environment Private residence    Living Arrangements Alone    Available Help at Discharge Friend(s)      Prior Function  Level of Independence Independent    Vocation Retired    Leisure She does not exercise      Cognition   Overall Cognitive Status Within Functional Limits for tasks assessed      Posture/Postural Control   Posture/Postural Control Postural limitations    Postural Limitations Rounded Shoulders;Forward head      ROM / Strength   AROM / PROM / Strength AROM;Strength      AROM   Overall AROM Comments Cervical AROM is WNL    AROM Assessment Site Shoulder    Right/Left Shoulder Left;Right    Right Shoulder Extension 48 Degrees    Right Shoulder Flexion 153 Degrees    Right Shoulder ABduction 157 Degrees    Right Shoulder Internal Rotation 57 Degrees    Right Shoulder External Rotation 68 Degrees    Left Shoulder Extension 50 Degrees    Left Shoulder Flexion 142 Degrees    Left Shoulder ABduction 162 Degrees    Left Shoulder Internal  Rotation 62 Degrees    Left Shoulder External Rotation 80 Degrees      Strength   Overall Strength Within functional limits for tasks performed             LYMPHEDEMA/ONCOLOGY QUESTIONNAIRE - 07/06/20 0001      Type   Cancer Type Right breast cancer      Lymphedema Assessments   Lymphedema Assessments Upper extremities      Right Upper Extremity Lymphedema   10 cm Proximal to Olecranon Process 29.1 cm    Olecranon Process 24.3 cm    10 cm Proximal to Ulnar Styloid Process 20.9 cm    Just Proximal to Ulnar Styloid Process 15.8 cm    Across Hand at [employer] 19.9 cm    At [place] of 2nd Digit 6.9 cm      Left Upper Extremity Lymphedema   10 cm Proximal to Olecranon Process 29.1 cm    Olecranon Process 25.2 cm    10 cm Proximal to Ulnar Styloid Process 19.7 cm    Just Proximal to Ulnar Styloid Process 15.6 cm    Across Hand at [employer] 19 cm    At [name] of 2nd Digit 6.4 cm           L-DEX FLOWSHEETS - 07/06/20 1000      L-DEX LYMPHEDEMA SCREENING   Measurement Type --   Unable to test due to pt having a defibrillator               Quick Dash - 07/06/20 0001    Open a tight or new jar Moderate difficulty    Do heavy household chores (wash walls, wash floors) Moderate difficulty    Carry a shopping bag or briefcase No difficulty    Wash your back Severe difficulty    Use a knife to cut food No difficulty    Recreational activities in which you take some force or impact through your arm, shoulder, or hand (golf, hammering, tennis) Severe difficulty    During the past week, to what extent has your arm, shoulder or hand problem interfered with your normal social activities with family, friends, neighbors, or groups? Not at all    During the past week, to what extent has your arm, shoulder or hand problem limited your work or other regular daily activities Not at all    Arm, shoulder, or hand pain. Mild    Tingling (pins and needles) in your arm, shoulder,  or hand None    Difficulty Sleeping No difficulty    DASH Score 25 %            Objective measurements completed on examination: See above findings.        Patient was instructed today in a home exercise program today for post op shoulder range of motion. These included active assist shoulder flexion in sitting, scapular retraction, wall walking with shoulder abduction, and hands behind head external rotation.  She was encouraged to do these twice a day, holding 3 seconds and repeating 5 times when permitted by her physician.           PT Education - 07/06/20 1052    Education Details Lymphedema risk reduction and post op shoulder ROM HEP    Person(s) Educated Patient;Other (comment)   friend   Methods Explanation;Demonstration;Handout    Comprehension Returned demonstration;Verbalized understanding               PT Long Term Goals - 07/06/20 1057      PT LONG TERM GOAL #1   Title Patient will demonstrate she has regained full shoulder RMm and function post operatively compared to baseline assessment.    Time 8    Period Weeks    Status New    Target Date 08/31/20           Breast Clinic Goals - 07/06/20 1057      Patient will be able to verbalize understanding of pertinent lymphedema risk reduction practices relevant to her diagnosis specifically related to skin care.   Time 1    Period Days    Status Achieved      Patient will be able to return demonstrate and/or verbalize understanding of the post-op home exercise program related to regaining shoulder range of motion.   Time 1    Period Days    Status Achieved      Patient will be able to verbalize understanding of the importance of attending the postoperative After Breast Cancer Class for further lymphedema risk reduction education and therapeutic exercise.   Time 1    Period Days    Status Achieved                 Plan - 07/06/20 1053    Clinical Impression Statement Patient was  diagnosed on 06/10/2020 with right grade III invasive ductal carcinoma breast cancer. It measures 7 mm and is located in the lower outer quadrant. It is ER/PR negative and HER2 positive with a Ki67 of 40%. She also has a history of heart failure and has an implanted defibrillator. 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 Her multidisciplinary medical team met prior to her assessments to determine a recommended treatment plan. She is planning to have a right lumpectomy and sentinel node biopsy followed by chemotherapy if her mass is > 5 mm and radiation. She will benefit from a post op PT reassessment to determine needs.    Stability/Clinical Decision Making Stable/Uncomplicated    Clinical Decision Making Low    Rehab Potential Excellent    PT Frequency --   eval and 1 f/u visit   PT Treatment/Interventions ADLs/Self Care Home Management;Therapeutic exercise;Patient/family education    PT Next Visit Plan Will reassess 3-4 weeks post op to determine needs    PT Home Exercise Plan Post op shoulder ROM HEP    Consulted and Agree with Plan of Care Other (Comment)   friend  Patient will benefit from skilled therapeutic intervention in order to improve the following deficits and impairments:  Postural dysfunction, Decreased knowledge of precautions, Impaired UE functional use, Pain, Decreased range of motion  Visit Diagnosis: Malignant neoplasm of lower-outer quadrant of right breast of female, estrogen receptor negative ([place]) - Plan: PT plan of care cert/re-cert  Abnormal posture - Plan: PT plan of care cert/re-cert   Patient will follow up at outpatient cancer rehab 3-4 weeks following surgery.  If the patient requires physical therapy at that time, a specific plan will be dictated and sent to the referring physician for approval. The patient was educated today on appropriate basic range of motion exercises to begin post operatively and the importance of attending the After  Breast Cancer class following surgery.  Patient was educated today on lymphedema risk reduction practices as it pertains to recommendations that will benefit the patient immediately following surgery.  She verbalized good understanding.      Problem List Patient Active Problem List   Diagnosis Date Noted  . Malignant neoplasm of lower-outer quadrant of right breast of female, estrogen receptor positive ([place]) 07/01/2020  . 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 Nonischemic cardiomyopathy ([place]) 06/04/2019  . Hypomagnesemia 11/18/2018  . Hypokalemia 11/15/2018  . Diabetes mellitus without complication ([place])   . Coronary artery disease   . Acute on chronic systolic CHF (congestive heart failure) ([place]) 01/04/2016  . Hyperlipidemia 01/03/2016  . Cardiomegaly - hypertensive 01/03/2016  . DM (diabetes mellitus), type 2 with complications ([place]) 70/62/3762  . Hypertension 01/03/2014   Annia Friendly, PT 07/06/20 11:09 AM  [place], [place], [address] Phone: [phone]   Fax:  [phone]  Name: Toni [name] MRN: [id number] Date of Birth: [birth year]

## 2020-07-06 NOTE — Patient Instructions (Signed)

## 2020-07-06 NOTE — Progress Notes (Signed)
[place] Work INITIAL SDOH Screening Note   Toni [name] is a 64 y.o. year old female accompanied by friend, Vickie.  Toni [name] was given information about support services today including CSW contact information, information about support team members and programs.   SDOH (Social Determinants of Health) assessments performed: Yes SDOH Interventions     Most Recent Value  SDOH Interventions  SDOH Interventions for the Following Domains Financial Strain  Financial Strain Interventions Development worker, community, Other (Comment)  [breast cancer foundations]        Family/Social Information:  . Housing Arrangement: patient lives alone in a home she owns . Relationship status: single       Family members/support persons in your life? Friends, brothers . Transportation to appointments provided by self, friend, friend's daughter . Financial concerns: Worried about expenses as she goes through treatment o Are you able to meet your monthly expenses or do you feel financially stressed? Able to meet currently o Are you concerned about future financial problems due to your illness or keeping your job and income through treatment? Yes . Employment: Retired. Income source: Conservation officer, historic buildings . Food Security: no concerns currently . Religious or spiritual practice: yes, Methodist . Medication Concerns: no (if patient is experiencing medication concerns, please refer to pharmacy) . Services Currently in place: medical for heart   . Concerns about diagnosis and/or treatment: stress initially with lack of information, stress over length of treatment and potential cost . Patient reported stressors: possible future financial . Patient enjoys doing projects around the house, thrift shopping, engaging in church, time with friends  Current coping skills/ strengths: Active sense of humor Capable of independent living Motivation for treatment/growth Religious Affiliation Special  hobby/interest Supportive family/friends    SUMMARY: Current SDOH Barriers:  . Financial constraints related to potential costs of care  Clinical Social Work Clinical Goal(s):  Marland Kitchen Over the next 30 days, patient will follow up with breast cancer foundations* as directed by SW  Interventions: . Patient interviewed and SDOH assessment performed . Provided patient with information about breast care foundations and [facility] program through [employer]  . Referred patient to financial navigator   Follow Up Plan: Client will complete applications for breast cancer foundations Patient verbalizes understanding of plan: Yes    Edwinna Areola Kjirsten Bloodgood LCSW

## 2020-07-07 ENCOUNTER — Telehealth: Payer: Self-pay | Admitting: *Deleted

## 2020-07-07 ENCOUNTER — Other Ambulatory Visit: Payer: Self-pay | Admitting: Radiation Oncology

## 2020-07-07 DIAGNOSIS — L989 Disorder of the skin and subcutaneous tissue, unspecified: Secondary | ICD-10-CM

## 2020-07-07 NOTE — Telephone Encounter (Signed)
   Primary Cardiologist: Sinclair Grooms, MD  Chart reviewed as part of pre-operative protocol coverage. Patient recently seen by Dr. Aundra Dubin on 05/31/2020 at which time she was doing well from a cardiac standpoint. She had a syncopal episode in 04/2020 but this was felt to be due to dehydration. No concerning arrhythmias noted on device interrogated. Patient was contacted today per pre-op protocol and reports no changes since last visit. No chest pain, shortness of breath, orthopnea, PND, edema. Weights are stable. She notes some dizziness with quick position changes but no palpitations or recurrent syncope.   Given past medical history and time since last visit, based on ACC/AHA guidelines, Toni [name] would be at acceptable risk for the planned procedure without further cardiovascular testing.   Patient on Aspirin for primary prevention so OK to hold as needed prior to procedure. Please restart this when able in the post-op period.   Given patient's CHF, would be mindful of IV fluids and volume status in the peri-operative period.   I will route this recommendation to the requesting party via Epic fax function and remove from pre-op pool.  Please call with questions.  Darreld Mclean, PA-C 07/07/2020, 11:09 AM

## 2020-07-07 NOTE — Telephone Encounter (Signed)
CALLED PATIENT  TO INFORM OF APPT. WITH MICHELLE GASKIN OF [facility] ON 08-02-20 - ARRIVAL TIME- 10:15 AM - ADDRESS- [place], PH. NO.- [phone], UNABLE TO LEAVE MESSAGE WILL CALL LATER

## 2020-07-07 NOTE — Telephone Encounter (Signed)
   [facility] Pre-operative Risk Assessment    HEARTCARE STAFF: - Please ensure there is not already an duplicate clearance open for this procedure. - Under Visit Info/Reason for Call, type in Other and utilize the format Clearance MM/DD/YY or Clearance TBD. Do not use dashes or single digits. - If request is for dental extraction, please clarify the # of teeth to be extracted.  Request for surgical clearance:  1. What type of surgery is being performed? RIGHT BREAST LUMPECTOMY and SENTINEL NODE Bx   2. When is this surgery scheduled? TBD   3. What type of clearance is required (medical clearance vs. Pharmacy clearance to hold med vs. Both)? MEDICAL  4. Are there any medications that need to be held prior to surgery and how long? ASA    5. Practice name and name of physician performing surgery? [facility]; DR. Coralie Keens   6. What is the office phone number? [phone]   7.   What is the office fax number? [place]: Malachi Bonds, CMA  8.   Anesthesia type (None, local, MAC, general) ? GENERAL   Julaine Hua 07/07/2020, 10:45 AM  _________________________________________________________________   (provider comments below)

## 2020-07-07 NOTE — Progress Notes (Signed)
Addendum to yesterday's clinic note. Pt did show [place] a lesion in the right upper extremity. It appeared to me to be a raised, hyperpigmented almost stuck on appearing lesion in the posterior upper right arm. It has smooth contour, but irregular borders. She does not have a dermatologist, but will be referred to see Lennie Odor at [facility].    Carola Rhine, PAC

## 2020-07-08 ENCOUNTER — Telehealth: Payer: Self-pay | Admitting: *Deleted

## 2020-07-08 NOTE — Telephone Encounter (Signed)
Called patient to inform of appt. with [facility] on 08-02-20 with Gloris Manchester, spoke with patient and she is aware of this appt.

## 2020-07-13 ENCOUNTER — Telehealth: Payer: Self-pay

## 2020-07-13 NOTE — Telephone Encounter (Signed)
Nutrition Assessment  Reason for Assessment:  Pt attended Breast Clinic on 07/06/2020 and was given nutrition packet of information by nurse navigator.  RD contact information was included.  ASSESSMENT:  64 year old female with new diagnosis of breast cancer.  Planning lumpectomy, possible adjuvant chemotherapy, radiation therapy and antiestrogens.    Spoke with patient via phone to introduce self and service at [facility].  Patient reports normal appetite.    Medications:  reviewed  Labs: reviewed  Anthropometrics:   Height: 66 inches Weight: 152 lb  BMI: 24  Stable weight   NUTRITION DIAGNOSIS: Food and nutrition related knowledge deficit related to new diagnosis of breast cancer as evidenced by no prior need for nutrition related information.  INTERVENTION:   Discussed briefly packet of information regarding nutritional tips for breast cancer patients.  Questions answered. Contact information provided and patient knows to contact me with questions/concerns.    MONITORING, EVALUATION, and GOAL: Pt will consume a healthy plant based diet to maintain lean body mass throughout treatment.   Toni [name], [place], [place] Registered Dietitian [phone] (mobile)

## 2020-07-14 ENCOUNTER — Telehealth: Payer: Self-pay | Admitting: *Deleted

## 2020-07-14 ENCOUNTER — Encounter: Payer: Self-pay | Admitting: *Deleted

## 2020-07-14 MED FILL — OZEMPIC (1 MG/DOSE) 4 MG/3M: 4 | 84 days supply | Qty: 9 | Fill #0 | Status: TO

## 2020-07-14 MED FILL — JARDIANCE 25 MG TABLET: 25 | 90 days supply | Qty: 90 | Fill #0 | Status: TO

## 2020-07-14 NOTE — Telephone Encounter (Signed)
Spoke with patient to follow up from [facility] last week and to assess navigation needs. She states she is just waiting on her sx date.  Informed her that I had sent a message to Dr. Trevor Mace office for an update.  Encouraged her to call with any needs or concerns.

## 2020-07-14 NOTE — Telephone Encounter (Signed)
Called [employer] to check the status of the patient's application. Representative stated that they need updated insurance card.

## 2020-07-15 ENCOUNTER — Telehealth (HOSPITAL_COMMUNITY): Payer: Self-pay | Admitting: *Deleted

## 2020-07-15 NOTE — Telephone Encounter (Signed)
Pt called to let us know her PCP put her on Jardiance instead of Farxiga as it is covered by her insurance with no copay, med list updated.

## 2020-07-18 ENCOUNTER — Other Ambulatory Visit (HOSPITAL_COMMUNITY): Payer: Self-pay | Admitting: Adult Health

## 2020-07-18 ENCOUNTER — Other Ambulatory Visit: Payer: Self-pay | Admitting: Surgery

## 2020-07-18 ENCOUNTER — Telehealth: Payer: Self-pay | Admitting: *Deleted

## 2020-07-18 ENCOUNTER — Other Ambulatory Visit (HOSPITAL_COMMUNITY): Payer: Self-pay | Admitting: Cardiology

## 2020-07-18 ENCOUNTER — Encounter: Payer: Self-pay | Admitting: *Deleted

## 2020-07-18 DIAGNOSIS — Z853 Personal history of malignant neoplasm of breast: Secondary | ICD-10-CM

## 2020-07-18 NOTE — Telephone Encounter (Signed)
Pt called regarding update on surgery. Msg sent to Dr. Trevor Mace nurse and surgery scheduler for an update. Informed pt will call back with update provided by Dr. Trevor Mace office. Received verbal understanding. No further needs voiced.

## 2020-07-19 ENCOUNTER — Telehealth: Payer: Self-pay | Admitting: Hematology and Oncology

## 2020-07-19 DIAGNOSIS — I10 Essential (primary) hypertension: Secondary | ICD-10-CM | POA: Diagnosis not present

## 2020-07-19 DIAGNOSIS — I509 Heart failure, unspecified: Secondary | ICD-10-CM | POA: Diagnosis not present

## 2020-07-19 DIAGNOSIS — E1169 Type 2 diabetes mellitus with other specified complication: Secondary | ICD-10-CM | POA: Diagnosis not present

## 2020-07-19 DIAGNOSIS — C50511 Malignant neoplasm of lower-outer quadrant of right female breast: Secondary | ICD-10-CM | POA: Diagnosis not present

## 2020-07-19 DIAGNOSIS — E785 Hyperlipidemia, unspecified: Secondary | ICD-10-CM | POA: Diagnosis not present

## 2020-07-19 MED FILL — LINZESS 145 MCG CAPSULE: 145 | 90 days supply | Qty: 90 | Fill #0

## 2020-07-19 MED FILL — CARVEDILOL 25 MG TABLET: 25 | 90 days supply | Qty: 180 | Fill #0

## 2020-07-19 MED FILL — DIGOXIN 0.125 MG TABLET: 125 | 90 days supply | Qty: 45 | Fill #0

## 2020-07-19 NOTE — Telephone Encounter (Signed)
Scheduled appt per 8/30 schmsg - mailed reminder letter with appt date and time

## 2020-07-20 DIAGNOSIS — E1169 Type 2 diabetes mellitus with other specified complication: Secondary | ICD-10-CM | POA: Diagnosis not present

## 2020-07-20 DIAGNOSIS — E785 Hyperlipidemia, unspecified: Secondary | ICD-10-CM | POA: Diagnosis not present

## 2020-07-20 DIAGNOSIS — C50511 Malignant neoplasm of lower-outer quadrant of right female breast: Secondary | ICD-10-CM | POA: Diagnosis not present

## 2020-07-20 DIAGNOSIS — Z794 Long term (current) use of insulin: Secondary | ICD-10-CM | POA: Diagnosis not present

## 2020-07-20 DIAGNOSIS — Z7984 Long term (current) use of oral hypoglycemic drugs: Secondary | ICD-10-CM | POA: Diagnosis not present

## 2020-07-20 DIAGNOSIS — I509 Heart failure, unspecified: Secondary | ICD-10-CM | POA: Diagnosis not present

## 2020-07-20 DIAGNOSIS — I10 Essential (primary) hypertension: Secondary | ICD-10-CM | POA: Diagnosis not present

## 2020-07-20 NOTE — Telephone Encounter (Signed)
Called and updated patient.   Will follow up once insurance card is received.

## 2020-07-21 ENCOUNTER — Encounter: Payer: Self-pay | Admitting: Licensed Clinical Social Worker

## 2020-07-21 NOTE — Progress Notes (Signed)
[place] CSW Progress Note  Clinical Education officer, museum contacted patient by phone to follow-up on resources discussed and sent after [facility]. Patient states she has not looked at them yet but is still interested in applying to breast cancer foundations ([place] in [place]). CSW explained process again and encouraged patient to call with any questions.     Edwinna Areola Laurette Villescas , LCSW

## 2020-07-28 ENCOUNTER — Ambulatory Visit: Payer: Self-pay | Admitting: Genetic Counselor

## 2020-07-28 ENCOUNTER — Telehealth: Payer: Self-pay | Admitting: Genetic Counselor

## 2020-07-28 ENCOUNTER — Encounter: Payer: Self-pay | Admitting: Genetic Counselor

## 2020-07-28 DIAGNOSIS — Z1379 Encounter for other screening for genetic and chromosomal anomalies: Secondary | ICD-10-CM | POA: Insufficient documentation

## 2020-07-28 MED FILL — DIGOXIN 0.125 MG TABLET: 125 | 90 days supply | Qty: 45 | Fill #0

## 2020-07-28 MED FILL — CARVEDILOL 25 MG TABLET: 25 | 90 days supply | Qty: 180 | Fill #0

## 2020-07-28 MED FILL — LINZESS 145 MCG CAPSULE: 145 | 90 days supply | Qty: 90 | Fill #0

## 2020-07-28 NOTE — Telephone Encounter (Signed)
Revealed negative genetic testing. Discussed that we do not know why she has breast cancer or why there is cancer in the family. There could be a genetic mutation in the family that Toni [name] did not inherit. There could also be a mutation in a different gene that we are not testing, or our current technology may not be able detect certain mutations. It will therefore be important for her to stay in contact with genetics to keep up with whether additional testing may be appropriate in the future.

## 2020-07-28 NOTE — Progress Notes (Signed)
HPI:  Toni [name] was previously seen in the [place] clinic due to a personal and family history of cancer and concerns regarding a hereditary predisposition to cancer. Please refer to our prior cancer genetics clinic note for more information regarding our discussion, assessment and recommendations, at the time. Toni [name]'s recent genetic test results were disclosed to her, as were recommendations warranted by these results. These results and recommendations are discussed in more detail below.  CANCER HISTORY:  Oncology History  Malignant neoplasm of lower-outer quadrant of right breast of female, estrogen receptor positive ([place])  07/01/2020 Initial Diagnosis   Screening mammogram showed a right breast mass. Mammogram and US showed a 0.7cm mass at the 6 o'clock position in the right breast, no axillary adenopathy. 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    07/06/2020 Cancer Staging   Staging form: Breast, AJCC 8th Edition - Clinical stage from 07/06/2020: Stage IB (cT1b, cN0, cM0, G3, ER+, PR-, HER2-) - Signed by Nicholas Lose, MD on 07/06/2020   07/26/2020 Genetic Testing   Negative genetic testing:  No pathogenic variants detected on the [facility] Common Hereditary Cancers Panel. The report date is 07/26/2020.   The Common Hereditary Cancers Panel offered by [facility] includes sequencing and/or deletion duplication testing of the following 48 genes: APC, ATM, AXIN2, BARD1, BMPR1A, BRCA1, BRCA2, BRIP1, CDH1, CDK4, CDKN2A (p14ARF), CDKN2A (p16INK4a), CHEK2, CTNNA1, DICER1, EPCAM (Deletion/duplication testing only), GREM1 (promoter region deletion/duplication testing only), KIT, MEN1, MLH1, MSH2, MSH3, MSH6, MUTYH, NBN, NF1, NTHL1, PALB2, PDGFRA, PMS2, POLD1, POLE, PTEN, RAD50, RAD51C, RAD51D, RNF43, SDHB, SDHC, SDHD, SMAD4, SMARCA4. STK11, TP53, TSC1, TSC2, and VHL.  The following genes were evaluated for  sequence changes only: SDHA and HOXB13 c.251G>A variant only.     FAMILY HISTORY:  We obtained a detailed, 4-generation family history.  Significant diagnoses are listed below: Family History  Problem Relation Age of Onset   Diabetes Mellitus I Mother    Lung cancer Mother 26   Breast cancer Mother        dx. late 30s/early 71s   Diabetes Mellitus I Father    Sudden death Father 21   Prostate cancer Brother 9   Hypertension Brother    Hypertension Brother    Diabetes Mellitus I Brother    Benign prostatic hyperplasia Brother    Heart failure Paternal Uncle    Thyroid cancer Niece        dx. in her 59s   Breast cancer Cousin        dx. in her 47s, recurrence in her 58s (maternal first cousin)   Breast cancer Cousin        female dx. in his early 22s (paternal first cousin)   Cancer Cousin        dx. in his early 69s, unknown type (paternal first cousin)   Cancer Cousin 87       unknown type (paternal first cousin)    Toni [name] does not have children. She has two full-brothers and two paternal half-brothers. Her oldest full-brother had prostate cancer diagnosed when he was 49. This brother's daughter had thyroid cancer, diagnosed in her early 35s.   Toni [name] mother died at the age of 32 and had a history of breast cancer diagnosed in her late 38s or early 70s, as well as lung cancer diagnosed when she was 57. She notes that her mother was not a smoker.  Toni [name] had three maternal aunts and six maternal uncles, none of whom had cancer to her knowledge. She has one maternal first cousin who was diagnosed with breast cancer in her 36s, then again in her 20s in the same breast. Toni [name] is not sure if this cousin has had genetic testing. Her maternal grandmother died in her 51s, and she has limited information about her maternal grandfather. There are no other known diagnoses of cancer on the maternal side of the family.  Toni [name]'s father died at the age of 74  and did not have cancer. Her father had two full-brothers, four full-sisters, one paternal half-sister, and two paternal half-brothers. None of these aunts/uncles have had cancer to Toni [name]'s knowledge. Three paternal first cousins have had cancer - one had female breast cancer diagnosed in his early 52s, the second had an unknown cancer diagnosed in his early 32s, and the third died from an unknown cancer at the age of 65. Toni [name]'s paternal grandmother died in her late 54s and her paternal grandfather died at the age of 64. There are no other known diagnoses of cancer on the paternal side of the family.  Toni [name] is [place] of previous family history of genetic testing for hereditary cancer risks. Patient's maternal ancestors are of unknown descent, and paternal ancestors are of unknown descent. There is no reported Ashkenazi Jewish ancestry. There is no known consanguinity.  GENETIC TEST RESULTS: Genetic testing reported out on 07/26/2020 through the [facility] Common Hereditary Cancers panel. No pathogenic variants were detected.   The Common Hereditary Cancers Panel offered by [facility] includes sequencing and/or deletion duplication testing of the following 48 genes: APC, ATM, AXIN2, BARD1, BMPR1A, BRCA1, BRCA2, BRIP1, CDH1, CDK4, CDKN2A (p14ARF), CDKN2A (p16INK4a), CHEK2, CTNNA1, DICER1, EPCAM (Deletion/duplication testing only), GREM1 (promoter region deletion/duplication testing only), KIT, MEN1, MLH1, MSH2, MSH3, MSH6, MUTYH, NBN, NF1, NTHL1, PALB2, PDGFRA, PMS2, POLD1, POLE, PTEN, RAD50, RAD51C, RAD51D, RNF43, SDHB, SDHC, SDHD, SMAD4, SMARCA4. STK11, TP53, TSC1, TSC2, and VHL.  The following genes were evaluated for sequence changes only: SDHA and HOXB13 c.251G>A variant only. The test report will be scanned into EPIC and located under the Molecular Pathology section of the Results Review tab.  A portion of the result report is included below for reference.     We discussed with Toni [name] that  because current genetic testing is not perfect, it is possible there may be a gene mutation in one of these genes that current testing cannot detect, but that chance is small.  We also discussed that there could be another gene that has not yet been discovered, or that we have not yet tested, that is responsible for the cancer diagnoses in the family. It is also possible there is a hereditary cause for the cancer in the family that Ms. Carbone did not inherit and therefore was not identified in her testing.  Therefore, it is important to remain in touch with cancer genetics in the future so that we can continue to offer Ms. Karpowicz the most up to date genetic testing.   CANCER SCREENING RECOMMENDATIONS: Ms. Mullenax test result is considered negative (normal).  This means that we have not identified a hereditary cause for her personal and family history of cancer at this time. While reassuring, this does not definitively rule out a hereditary predisposition to cancer. It is still possible that there could be genetic mutations that are undetectable by current technology. There could be genetic mutations in genes that  have not been tested or identified to increase cancer risk.  Therefore, it is recommended she continue to follow the cancer management and screening guidelines provided by her oncology and primary healthcare provider.   An individual's cancer risk and medical management are not determined by genetic test results alone. Overall cancer risk assessment incorporates additional factors, including personal medical history, family history, and any available genetic information that may result in a personalized plan for cancer prevention and surveillance.  RECOMMENDATIONS FOR FAMILY MEMBERS:  Individuals in this family might be at some increased risk of developing cancer, over the general population risk, simply due to the family history of cancer.  We recommended women in this family have a yearly mammogram  beginning at age 67, or 66 years younger than the earliest onset of cancer, an annual clinical breast exam, and perform monthly breast self-exams. Women in this family should also have a gynecological exam as recommended by their primary provider. All family members should be referred for colonoscopy starting at age 10.  It is also possible there is a hereditary cause for the cancer in Ms. Xie's family that she did not inherit and therefore was not identified in her.  Based on Ms. Santillanes's family history, we recommended her maternal cousin, who was diagnosed with breast cancer in her 68s, have genetic counseling and testing. Ms. Schneiderman will let us know if we can be of any assistance in coordinating genetic counseling and/or testing for this family member.   FOLLOW-UP: Lastly, we discussed with Ms. Salvaggio that cancer genetics is a rapidly advancing field and it is possible that new genetic tests will be appropriate for her and/or her family members in the future. We encouraged her to remain in contact with cancer genetics on an annual basis so we can update her personal and family histories and let her know of advances in cancer genetics that may benefit this family.   Our contact number was provided. Ms. Arscott's questions were answered to her satisfaction, and she knows she is welcome to call us at anytime with additional questions or concerns.   Clint Guy, MS, [facility] Genetic Counselor [email or web address] Phone: [phone]

## 2020-08-02 ENCOUNTER — Encounter (HOSPITAL_COMMUNITY): Payer: No Typology Code available for payment source | Admitting: Cardiology

## 2020-08-03 NOTE — Pre-Procedure Instructions (Signed)
[place], [place] - [address] [facility]. [address] [place] [address] Phone: [phone] Fax: [place], [place] - [place] [address] Phone: [phone] Fax: [phone]  [facility] - [place], [place] - [address] [place] [address] Phone: [phone] Fax: [phone]      Your procedure is scheduled on Thursday September 23rd.  Report to [facility] Main Entrance "A" at 5:30 A.M., and check in at the Admitting office.  Call this number if you have problems the morning of surgery:  [phone]  Call [phone] if you have any questions prior to your surgery date Monday-Friday 8am-4pm    Remember:  Do not eat after midnight the night before your surgery  You may drink clear liquids until 4:30 the morning of your surgery.   Clear liquids allowed are: Water, Non-Citrus Juices (without pulp), Carbonated Beverages, Clear Tea, Black Coffee Only, and Gatorade   Patient Instructions  . The day of surgery (if you have diabetes): o Drink ONE (1) Gatorade 2 (G2) as directed. o This drink was given to you during your hospital  pre-op appointment visit.  o The pre-op nurse will instruct you on the time to drink the   Gatorade 2 (G2) depending on your surgery time- finish by 4:30 AM the morning of surgery. o Color of the Gatorade may vary. Red is not allowed. o Nothing else to drink after completing the  Gatorade 2 (G2).         If you have questions, please contact your surgeon's office.   Take these medicines the morning of surgery with A SIP OF WATER  carvedilol (COREG)  hydrALAZINE (APRESOLINE) digoxin (LANOXIN)  hydrALAZINE (APRESOLINE)  isosorbide dinitrate (ISORDIL) cetirizine (ZYRTEC)  If needed  Follow your surgeon's instructions on when to stop Asprin.  If no  instructions were given by your surgeon then you will need to call the office to get those instructions.     As of today, STOP taking any Aspirin (unless otherwise instructed by your surgeon) Aleve, Naproxen, Ibuprofen, Motrin, Advil, Goody's, BC's, all herbal medications, fish oil, and all vitamins.   HOW TO MANAGE YOUR DIABETES BEFORE AND AFTER SURGERY  Why is it important to control my blood sugar before and after surgery? . Improving blood sugar levels before and after surgery helps healing and can limit problems. . A way of improving blood sugar control is eating a healthy diet by: o  Eating less sugar and carbohydrates o  Increasing activity/exercise o  Talking with your doctor about reaching your blood sugar goals . High blood sugars (greater than 180 mg/dL) can raise your risk of infections and slow your recovery, so you will need to focus on controlling your diabetes during the weeks before surgery. . Make sure that the doctor who takes care of your diabetes knows about your planned surgery including the date and location.  How do I manage my blood sugar before surgery? . Check your blood sugar at least 4 times a day, starting 2 days before surgery, to make sure that the level is not too high or low. o Check your blood sugar the morning of your surgery when you wake up and every 2 hours until you get to the Short Stay unit. . If your blood sugar is less than 70 mg/dL, you will need to treat for  low blood sugar: o Do not take insulin. o Treat a low blood sugar (less than 70 mg/dL) with  cup of clear juice (cranberry or apple), 4 glucose tablets, OR glucose gel. Recheck blood sugar in 15 minutes after treatment (to make sure it is greater than 70 mg/dL). If your blood sugar is not greater than 70 mg/dL on recheck, call [phone] o  for further instructions. . Report your blood sugar to the short stay nurse when you get to Short Stay.  . If you are admitted to the hospital after  surgery: o Your blood sugar will be checked by the staff and you will probably be given insulin after surgery (instead of oral diabetes medicines) to make sure you have good blood sugar levels. o The goal for blood sugar control after surgery is 80-180 mg/dL.     WHAT DO I DO ABOUT MY DIABETES MEDICATION?   Marland Kitchen Do not take oral diabetes medicines (pills): glipiZIDE (GLUCOTROL XL), JARDIANCE , metFORMIN (GLUCOPHAGE)   the morning of surgery.  . Do NOT take JARDIANCE the day before surgery  . THE NIGHT BEFORE SURGERY, do NOT take evening dose of glipiZIDE (GLUCOTROL XL); take 10 units of Insulin Glargine (BASAGLAR KWIKPEN) insulin.    . The day of surgery, do not take other diabetes injectables, including, OZEMPIC, Byetta (exenatide), Bydureon (exenatide ER), Victoza (liraglutide), or Trulicity (dulaglutide).                       Do not wear jewelry, make up, or nail polish            Do not wear lotions, powders, perfumes/colognes, or deodorant.            Do not shave 48 hours prior to surgery.  Men may shave face and neck.            Do not bring valuables to the hospital.            [facility] is not responsible for any belongings or valuables.  Do NOT Smoke (Tobacco/Vaping) or drink Alcohol 24 hours prior to your procedure If you use a CPAP at night, you may bring all equipment for your overnight stay.   Contacts, glasses, dentures or bridgework may not be worn into surgery.      For patients admitted to the hospital, discharge time will be determined by your treatment team.   Patients discharged the day of surgery will not be allowed to drive home, and someone needs to stay with them for 24 hours.    Special instructions:   [facility]- Preparing For Surgery  Before surgery, you can play an important role. Because skin is not sterile, your skin needs to be as free of germs as possible. You can reduce the number of germs on your skin by washing with CHG (chlorahexidine  gluconate) Soap before surgery.  CHG is an antiseptic cleaner which kills germs and bonds with the skin to continue killing germs even after washing.    Oral Hygiene is also important to reduce your risk of infection.  Remember - BRUSH YOUR TEETH THE MORNING OF SURGERY WITH YOUR REGULAR TOOTHPASTE  Please do not use if you have an allergy to CHG or antibacterial soaps. If your skin becomes reddened/irritated stop using the CHG.  Do not shave (including legs and underarms) for at least 48 hours prior to first CHG shower. It is OK to shave your face.  Please follow these instructions  carefully.   1. Shower the NIGHT BEFORE SURGERY and the MORNING OF SURGERY with CHG Soap.   2. If you chose to wash your hair, wash your hair first as usual with your normal shampoo.  3. After you shampoo, rinse your hair and body thoroughly to remove the shampoo.  4. Use CHG as you would any other liquid soap. You can apply CHG directly to the skin and wash gently with a scrungie or a clean washcloth.   5. Apply the CHG Soap to your body ONLY FROM THE NECK DOWN.  Do not use on open wounds or open sores. Avoid contact with your eyes, ears, mouth and genitals (private parts). Wash Face and genitals (private parts)  with your normal soap.   6. Wash thoroughly, paying special attention to the area where your surgery will be performed.  7. Thoroughly rinse your body with warm water from the neck down.  8. DO NOT shower/wash with your normal soap after using and rinsing off the CHG Soap.  9. Pat yourself dry with a CLEAN TOWEL.  10. Wear CLEAN PAJAMAS to bed the night before surgery  11. Place CLEAN SHEETS on your bed the night of your first shower and DO NOT SLEEP WITH PETS.   Day of Surgery: Wear Clean/Comfortable clothing the morning of surgery Do not apply any deodorants/lotions.   Remember to brush your teeth WITH YOUR REGULAR TOOTHPASTE.   Please read over the following fact sheets that you were  given.

## 2020-08-04 ENCOUNTER — Encounter (HOSPITAL_COMMUNITY)
Admission: RE | Admit: 2020-08-04 | Discharge: 2020-08-04 | Disposition: A | Discharge status: Home / Self Care | Payer: HMO | Source: Ambulatory Visit | Attending: Surgery | Admitting: Surgery

## 2020-08-04 ENCOUNTER — Encounter (HOSPITAL_COMMUNITY): Payer: Self-pay

## 2020-08-04 ENCOUNTER — Encounter: Payer: Self-pay | Admitting: Internal Medicine

## 2020-08-04 ENCOUNTER — Other Ambulatory Visit: Payer: Self-pay

## 2020-08-04 DIAGNOSIS — Z01812 Encounter for preprocedural laboratory examination: Secondary | ICD-10-CM | POA: Diagnosis not present

## 2020-08-04 DIAGNOSIS — E119 Type 2 diabetes mellitus without complications: Secondary | ICD-10-CM | POA: Insufficient documentation

## 2020-08-04 DIAGNOSIS — Z794 Long term (current) use of insulin: Secondary | ICD-10-CM | POA: Diagnosis not present

## 2020-08-04 DIAGNOSIS — Z7901 Long term (current) use of anticoagulants: Secondary | ICD-10-CM | POA: Diagnosis not present

## 2020-08-04 DIAGNOSIS — I5022 Chronic systolic (congestive) heart failure: Secondary | ICD-10-CM | POA: Diagnosis not present

## 2020-08-04 DIAGNOSIS — C50911 Malignant neoplasm of unspecified site of right female breast: Secondary | ICD-10-CM | POA: Diagnosis not present

## 2020-08-04 DIAGNOSIS — I428 Other cardiomyopathies: Secondary | ICD-10-CM | POA: Insufficient documentation

## 2020-08-04 DIAGNOSIS — I110 Hypertensive heart disease with heart failure: Secondary | ICD-10-CM | POA: Insufficient documentation

## 2020-08-04 DIAGNOSIS — I2510 Atherosclerotic heart disease of native coronary artery without angina pectoris: Secondary | ICD-10-CM | POA: Diagnosis not present

## 2020-08-04 DIAGNOSIS — Z79899 Other long term (current) drug therapy: Secondary | ICD-10-CM | POA: Diagnosis not present

## 2020-08-04 DIAGNOSIS — Z7982 Long term (current) use of aspirin: Secondary | ICD-10-CM | POA: Insufficient documentation

## 2020-08-04 HISTORY — DX: Presence of automatic (implantable) cardiac defibrillator: Z95.810

## 2020-08-04 LAB — BASIC METABOLIC PANEL
Anion gap: 10 (ref 5–15)
BUN: 17 mg/dL (ref 8–23)
CO2: 24 mmol/L (ref 22–32)
Calcium: 9.9 mg/dL (ref 8.9–10.3)
Chloride: 107 mmol/L (ref 98–111)
Creatinine, Ser: 0.84 mg/dL (ref 0.44–1.00)
GFR calc Af Amer: >60 mL/min (ref >60)
GFR calc non Af Amer: >60 mL/min (ref >60)
Glucose, Bld: 204 mg/dL — ABNORMAL HIGH (ref 70–99)
Potassium: 4.6 mmol/L (ref 3.5–5.1)
Sodium: 141 mmol/L (ref 135–145)

## 2020-08-04 LAB — CBC
HCT: 46.3 % — ABNORMAL HIGH (ref 36.0–46.0)
Hemoglobin: 14.2 g/dL (ref 12.0–15.0)
MCH: 28.7 pg (ref 26.0–34.0)
MCHC: 30.7 g/dL (ref 30.0–36.0)
MCV: 93.7 fL (ref 80.0–100.0)
Platelets: 284 10*3/uL (ref 150–400)
RBC: 4.94 MIL/uL (ref 3.87–5.11)
RDW: 12.6 % (ref 11.5–15.5)
WBC: 5.5 10*3/uL (ref 4.0–10.5)
nRBC: 0 % (ref 0.0–0.2)

## 2020-08-04 LAB — HEMOGLOBIN A1C
Hgb A1c MFr Bld: 9.3 % — ABNORMAL HIGH (ref 4.8–5.6)
Mean Plasma Glucose: 220.21 mg/dL

## 2020-08-04 LAB — GLUCOSE, CAPILLARY: Glucose-Capillary: 210 mg/dL — ABNORMAL HIGH (ref 70–99)

## 2020-08-04 NOTE — Progress Notes (Signed)
PCP - DR Margaretha Sheffield Cardiologist - DR [facility]       CLEARANCE DONE  PPM/ICD - [employer]     DR Lovena Le Device Orders - YES Rep Notified - YES  Chest x-ray - NA EKG - 6/21 Stress Test - 2.20 ECHO - 6/20 Cardiac Cath - 12/19   Fasting Blood Sugar - 210 Checks Blood Sugar _2____ times a day  Blood Thinner Instructions:NA Aspirin Instructions:STOP  ERAS Protcol -YES PRE-SURGERY Ensure or G2- DRINK GIVEN  COVID TEST- FOR MON.   Anesthesia review: HEART HX  Patient denies shortness of breath, fever, cough and chest pain at PAT appointment   All instructions explained to the patient, with a verbal understanding of the material. Patient agrees to go over the instructions while at home for a better understanding. Patient also instructed to self quarantine after being tested for COVID-19. The opportunity to ask questions was provided.

## 2020-08-04 NOTE — Progress Notes (Signed)
[facility] DEVICE PROGRAMMING  Patient Information: Name:  Toni [name]  DOB:  [birth year]  MRN:  [id number]   Planned Procedure: R breast lumpectomy with seed localization and sentinel node biopsy  Surgeon: Coralie Keens  Date of Procedure: 08/11/20  Cautery will be used.  Position during surgery: supine   Please send documentation back to:  [facility] (Fax # [phone])   Device Information:  Clinic EP Physician:  Cristopher Peru, MD  Device Type:  Defibrillator Manufacturer and Phone #:  [employer]: [phone] Pacemaker Dependent?:  No. Date of Last Device Check:  07/01/2020 Normal Device Function?:  Yes.    Electrophysiologist's Recommendations:   Have magnet available.  Provide continuous ECG monitoring when magnet is used or reprogramming is to be performed.   Procedure may interfere with device function.  Magnet should be placed over device during procedure.  Per Device Clinic Standing Orders, Rosalyn Charters, RN  8:28 AM 08/04/2020

## 2020-08-04 NOTE — Progress Notes (Addendum)
Anesthesia Chart Review:  Case: [id number] Date/Time: 08/11/20 0715   Procedure: RIGHT BREAST LUMPECTOMY WITH RADIOACTIVE SEED AND SENTINEL LYMPH NODE BIOPSY (Right Breast)   Anesthesia type: General   Pre-op diagnosis: RIGHT BREAST CANCER   Location: [facility] OR ROOM 01 / [facility]   Surgeons: Coralie Keens, MD    RSL 08/10/20   DISCUSSION: Patient is a 64 year old female scheduled for the above procedure.   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 AAA repair had been added to her history on 11/03/19 (Cardiac Rehab), but she denied knowledge of ever having this done, so removed from her history (normal abdominal aorta 05/14/13 CT).   Preoperative cardiology input outlined on 07/07/20 by Sande Rives, PA-C: "...Given past medical history and time since last visit, based on ACC/AHA guidelines, Toni [name] would be at acceptable risk for the planned procedure without further cardiovascular testing.   Patient on Aspirin for primary prevention so OK to hold as needed prior to procedure. Please restart this when able in the post-op period.   Given patient's CHF, would be mindful of IV fluids and volume status in the peri-operative period."  Device Information: Clinic EP Physician:  Cristopher Peru, MD  Device Type:  Defibrillator Manufacturer and Phone #:  [employer]: [phone] Pacemaker Dependent?:  No. Date of Last Device Check:  07/01/2020       Normal Device Function?:  Yes.    Electrophysiologist's Recommendations:  Have magnet available.  Provide  continuous ECG monitoring when magnet is used or reprogramming is to be performed.   Procedure may interfere with device function.  Magnet should be placed over device during procedure.  08/04/20 A1c 9.3 (mean plasma glucose 220.21). She is on glipizide XL 20 mg daily, insulin glargine 20 units Q PM, Jardiance 25 mg daily, metformin 1000 mg BID, Ozempic 1 mg [place] weekly. A1c result routed to Dr. [name] for review. She will get a fasting CBG on arrival the day of surgery.   Presurgical COVID-19 testing is scheduled for 08/08/20. Anesthesia team to evaluate on the day of surgery. ICD is located in the left chest.    VS: BP 112/69   Pulse 97   Temp 36.8 C (Oral)   Resp 18   Ht 5\' 5"  (1.651 m)   Wt 71.4 kg   SpO2 99%   BMI 26.19 kg/m     PROVIDERS: Donald Prose, MD is PCP  - Daneen Schick, MD is cardiologist. Last evaluated 10/2018, as she has since been primarily seeing HF and EP cardiology. Loralie Champagne, MD is HF cardiologist. Last evaluation 05/31/20.  Cristopher Peru, MD Is EP cardiologist. Last evaluation 09/18/19. ICD was working normally.  Nicholas Lose, MD is HEM-ONC Kyung Rudd, MD is RAD-ONC   LABS: Preoperative labs noted. See DISCUSSION. (all labs ordered are listed, but only abnormal results are displayed)  Labs Reviewed  GLUCOSE, CAPILLARY - Abnormal; Notable for the following components:      Result Value   Glucose-Capillary 210 (*)    All other components within normal limits  CBC - Abnormal; Notable for the following components:  HCT 46.3 (*)    All other components within normal limits  BASIC METABOLIC PANEL - Abnormal; Notable for the following components:   Glucose, Bld 204 (*)    All other components within normal limits  HEMOGLOBIN A1C - Abnormal; Notable for the following components:   Hgb A1c MFr Bld 9.3 (*)    All other components within normal limits     IMAGES: CXR 05/04/20: FINDINGS: The heart size and pulmonary vascularity are normal  and the lungs are clear. No effusions. AICD in place. No bone abnormality. IMPRESSION: No active cardiopulmonary disease.  CT head 05/04/20: IMPRESSION: Normal noncontrast head CT.   EKG: 05/04/20: Sinus rhythm Left axis deviation Consider anterior infarct Confirmed by Fredia Sorrow [phone]) on 05/04/2020 10:26:10 AM   CV: Echo 04/28/19: IMPRESSIONS  1. The left ventricle has severely reduced systolic function, with an  ejection fraction of 25-30%. The cavity size was mildly dilated. Left  ventricular diastolic Doppler parameters are consistent with impaired  relaxation. Left ventricular diffuse  hypokinesis.  2. No evidence of mitral valve stenosis. Mild mitral regurgitation.  3. The aortic valve is tricuspid. Mild calcification of the aortic valve.  No stenosis of the aortic valve.  4. Normal RV size with mildly decreased systolic function.  5. The aortic root is normal in size and structure.  6. Normal IVC size. No complete TR doppler jet so unable to estimate PA  systolic pressure.    CPX 01/02/19: Conclusion:  Exercise testing with gas exchange demonstrates low-normal functional capacity when compared to matched sedentary norms. There is only a mild (if any) HF limitation with no evidence of pulmonary limitation. Patient's body habitus is mildly contributing to the mildly reduced oxygen consumption. Overall, patient's gas-exchange parameters indicate deconditioning as primary limiting factor. Of note, there was a mildly blunted BP response to exercise.  Test, report and preliminary impression by:  Landis Martins, MS, ACSM-RCEP  01/02/2019 2:15 PM  - Low normal functional capacity. No significant HF limitation. Her boody habitus is contributing to exercise limitation .  Glori Bickers, MD     MRI Cardiac 11/18/18: IMPRESSION: 1.  Moderately dilated LV with EF 14%, diffuse hypokinesis. 2. Mildly dilated RV with severely decreased systolic function, EF 11%. 3. At  least moderate, central mitral regurgitation (likely functional). 4. On delayed enhancement imaging, there was mid-wall LGE at the inferoseptal RV insertion site. This is nonspecific and can be seen with pressure and volume overload.   RHC/LHC 11/17/18:  70 to 80% ostial diagonal of small to moderate size.  40% mid LAD.  Coronaries otherwise widely patent.  Severe systolic heart failure, with acute component as LVEDP is still elevated.  Estimated EF less than 20%.  Etiology is nonischemic.  Moderate to severe pulmonary hypertension with mean pulmonary artery pressure 37 mmHg.  Phasic pressure 60/22 mmHg.   V wave is present consistent with mitral regurgitation. RECOMMENDATIONS:  Guideline directed therapy for systolic dysfunction including ARNI (Entresto), beta blocker (carvedilol), and MRA (Aldactone or eplerenone) as tolerated by blood pressure.  Given the presence of diabetes, it would be appropriate to add an SGLT2 to improve outcome in setting of heart failure.  If LV function does not improve, will be referred to advanced heart failure team at which point consideration of MitraClip may also be entertained.   Past Medical History:  Diagnosis Date  . AICD (automatic cardioverter/defibrillator) present   . CHF (congestive heart failure) ([place])   . Diabetes mellitus without complication ([place])   .  Family history of breast cancer   . Family history of prostate cancer   . Family history of thyroid cancer   . Hypertension     Past Surgical History:  Procedure Laterality Date  . ABDOMINAL HYSTERECTOMY    . CARDIAC CATHETERIZATION     in [facility], clean, per pt.  . ICD IMPLANT N/A 06/18/2019   Procedure: ICD IMPLANT;  Surgeon: Evans Lance, MD;  Location: [facility];  Service: Cardiovascular;  Laterality: N/A;  . RIGHT/LEFT HEART CATH AND CORONARY ANGIOGRAPHY N/A 11/17/2018   Procedure: RIGHT/LEFT HEART CATH AND CORONARY ANGIOGRAPHY;  Surgeon: Belva Crome, MD;   Location: [facility];  Service: Cardiovascular;  Laterality: N/A;    MEDICATIONS: . aspirin 81 MG chewable tablet  . carvedilol (COREG) 25 MG tablet  . cetirizine (ZYRTEC) 10 MG tablet  . digoxin (LANOXIN) 0.125 MG tablet  . furosemide (LASIX) 20 MG tablet  . glipiZIDE (GLUCOTROL XL) 10 MG 24 hr tablet  . hydrALAZINE (APRESOLINE) 50 MG tablet  . Insulin Glargine (BASAGLAR KWIKPEN) 100 UNIT/ML  . isosorbide dinitrate (ISORDIL) 20 MG tablet  . JARDIANCE 25 MG TABS tablet  . LINZESS 145 MCG CAPS capsule  . losartan (COZAAR) 25 MG tablet  . metFORMIN (GLUCOPHAGE) 1000 MG tablet  . Multiple Vitamin (MULTIVITAMIN WITH MINERALS) TABS tablet  . Multiple Vitamins-Minerals (HAIR SKIN AND NAILS FORMULA) TABS  . OZEMPIC, 1 MG/DOSE, 4 MG/3ML SOPN  . Scar Treatment Products ([place])  . simvastatin (ZOCOR) 20 MG tablet  . spironolactone (ALDACTONE) 25 MG tablet  . TRUE METRIX BLOOD GLUCOSE TEST test strip  . TRUEPLUS LANCETS 30G MISC  . UNIFINE PENTIPS 32G X 4 MM MISC   No current facility-administered medications for this encounter.    Myra Gianotti, PA-C Surgical Short Stay/Anesthesiology [facility] Phone [phone] [facility] Phone [phone] 08/04/2020 3:32 PM

## 2020-08-04 NOTE — Anesthesia Preprocedure Evaluation (Addendum)
Anesthesia Evaluation  Patient identified by MRN, date of birth, ID band Patient awake    Reviewed: Allergy & Precautions, NPO status , Patient's Chart, lab work & pertinent test results, reviewed documented beta blocker date and time   History of Anesthesia Complications Negative for: history of anesthetic complications  Airway Mallampati: II  TM Distance: >3 FB Neck ROM: Full    Dental  (+) Dental Advisory Given, Missing   Pulmonary neg pulmonary ROS,    Pulmonary exam normal        Cardiovascular hypertension, Pt. on medications and Pt. on home beta blockers + CAD and +CHF  Normal cardiovascular exam+ Cardiac Defibrillator   Electrophysiologist's Recommendations: Have magnet available. Provide continuous ECG monitoring when magnet is used or reprogramming is to be performed. Procedure may interfere with device function. Magnet should be placed over device during procedure.  [place]  '20 TTE - EF 25-30%. LV was mildly dilated. Impaired  relaxation. Left ventricular diffuse hypokinesis. Mild MR. Normal RV size with mildly decreased systolic function.      Neuro/Psych negative neurological ROS  negative psych ROS   GI/Hepatic negative GI ROS, Neg liver ROS,   Endo/Other  diabetes, Type 2, Oral Hypoglycemic Agents, Insulin Dependent  Renal/GU negative Renal ROS     Musculoskeletal negative musculoskeletal ROS (+)   Abdominal   Peds  Hematology negative hematology ROS (+)   Anesthesia Other Findings Covid test negative   Reproductive/Obstetrics  Breast cancer                            Anesthesia Physical Anesthesia Plan  ASA: III  Anesthesia Plan: General   Post-op Pain Management:  Regional for Post-op pain   Induction: Intravenous  PONV Risk Score and Plan: 3 and Treatment may vary due to age or medical condition, Ondansetron and Dexamethasone  Airway  Management Planned: LMA  Additional Equipment: None  Intra-op Plan:   Post-operative Plan: Extubation in OR  Informed Consent: I have reviewed the patients History and Physical, chart, labs and discussed the procedure including the risks, benefits and alternatives for the proposed anesthesia with the patient or authorized representative who has indicated his/her understanding and acceptance.     Dental advisory given  Plan Discussed with: CRNA and Anesthesiologist  Anesthesia Plan Comments: (Magnet to be placed on AICD, needs to be checked in PACU prior to discharge)      Anesthesia Quick Evaluation

## 2020-08-08 ENCOUNTER — Other Ambulatory Visit (HOSPITAL_COMMUNITY)
Admission: RE | Admit: 2020-08-08 | Discharge: 2020-08-08 | Disposition: A | Discharge status: Home / Self Care | Payer: HMO | Source: Ambulatory Visit | Attending: Surgery | Admitting: Surgery

## 2020-08-08 DIAGNOSIS — Z20822 Contact with and (suspected) exposure to covid-19: Secondary | ICD-10-CM | POA: Insufficient documentation

## 2020-08-08 DIAGNOSIS — Z01812 Encounter for preprocedural laboratory examination: Secondary | ICD-10-CM | POA: Diagnosis not present

## 2020-08-08 LAB — SARS CORONAVIRUS 2 (TAT 6-24 HRS): SARS Coronavirus 2: NEGATIVE

## 2020-08-10 ENCOUNTER — Other Ambulatory Visit: Payer: Self-pay

## 2020-08-10 ENCOUNTER — Ambulatory Visit
Admission: RE | Admit: 2020-08-10 | Discharge: 2020-08-10 | Disposition: A | Discharge status: Home / Self Care | Payer: HMO | Source: Ambulatory Visit | Attending: Surgery | Admitting: Surgery

## 2020-08-10 DIAGNOSIS — C50911 Malignant neoplasm of unspecified site of right female breast: Secondary | ICD-10-CM | POA: Diagnosis not present

## 2020-08-10 DIAGNOSIS — Z853 Personal history of malignant neoplasm of breast: Secondary | ICD-10-CM

## 2020-08-10 NOTE — Telephone Encounter (Signed)
This encounter was created in error - please disregard.

## 2020-08-10 NOTE — Patient Outreach (Signed)
[facility]) Care Management Chronic Special Needs Program  08/10/2020  Name: Toni [name] DOB: 1956-04-12  MRN: [id number]  Toni [name] is enrolled in a chronic special needs plan for Diabetes. Chronic Care Management Coordinator telephoned client to review health risk assessment and to develop individualized care plan. HIPAA verified with client.   Introduced the chronic care management program, importance of client participation, and taking their care plan to all provider appointments and inpatient facilities.  Client states she is able to obtain her medications and takes them as prescribed. Client reports she is seeing the pharmacist at her primary care provider office for medication management.  Client reports she is scheduled to have surgery tomorrow for right breast cancer.   Client states her most recent A1c was 9.3. She states she checks her blood sugar 2-3 times per week and denies any low blood sugar readings.   Goals Addressed              This Visit's Progress   .   Acknowledge receipt of Advanced Directive package        RN case manager will send client Advanced Directive packet.     .  "would like to bring my A1c down" (pt-stated)        Discussed diabetes self management actions:  Glucose monitoring per provider recommendation  Check feet daily  visit provider every 3-6 months as directed  Hbg A1C level every 3-6 months.  Carbohydrate controlled meal planning  Taking diabetes medication as prescribed by provider  Physical activity     .  Client understands the importance of follow-up with providers by attending scheduled visits        Client reports adherence to provider appointments. Primary care provider visit June 2021, 06/24/18 Continue to maintain and keep follow up visits with your providers.     .  Client verbalize knowledge of Heart Failure disease self management skills in 6 months        . Signs and symptoms of heart  failure reviewed. Client states an understanding. . Review [employer] calendar sent in the mail for Heart Failure information.  . It is important to weigh daily and write down weights. Pay attention to your body if you have any shortness of breath, swelling in your feet, ankles, legs or your waistband gets tight.  . Call your provider if you gain 3 pounds overnight or gain 5 pounds in a week. RN case manager will send client education article: Heart Failure adult    .  Client will report checking her blood sugar at least once every day in 3 months        RN case manager will send client education article: blood glucose monitoring Check blood sugars daily before eating with a goal of 80-130.  You can also check 1 1/2 hours after eating with goal of 180 or less.      .  Client will report no worsening of symptoms related to heart disease within the next 80months        RN case manager will send client education article: Heart disease in diabetics Notify provider for symptoms of chest pain, sweating, nausea/vomiting, irregular heartbeat, palpitations, rapid heart rate, shortness of breath or dizziness or fainting . Call 911 for severe symptoms of chest pain or shortness of breath. . Take medications as prescribed.  . Follow a low salt meal plan, limit or avoid drinks with alcohol.     .  Client  will verbalize knowledge of self management of Hypertension as evidences by BP reading of 140/90 or less; or as defined by provider        . Plan to check Blood pressure regularly. If you do not have a B/P monitor (cuff), one can be provided to you. Write results in your [employer] calendar. . Do not skip doses of blood pressure medicine.  The medicine does not work as well if you skip doses.  Skipping doses also puts you at risk for problems . Plan to eat low salt and heart healthy meals full of fruits, vegetables, whole grains, lean protein and limit fat and sugars.  RN case manager  will send client education article: HIgh Blood pressure in adults, Low salt diet.     .  Client will verbalize signs/ symptoms of infection post surgical discharge in 3 months        RN case manager reviewed signs / symptoms of infection after surgical procedure" -fever and chills -Redness, -Swelling -Pain - abnormal bleeding or any abnormal discharge from the surgical site -nausea and vomiting that does not get better.  Contact your doctor / surgeon for post surgery symptoms and/ or concerns RN provided client contact phone number for the 24 hour nurse advise line    .  HEMOGLOBIN A1C < 7        .Discussed diabetes self management actions:  Glucose monitoring per provider recommendation  Check feet daily  Visit provider every 3-6 months as directed  Hbg A1C level every 3-6 months.  Eye Exam yearly  Carbohydrate controlled meal planning  Taking diabetes medication as prescribed by provider  Physical activity     .  Maintain timely refills of diabetic medication as prescribed within the year .        Client reports timely refills of diabetic medication Take your medication as prescribed Continue to follow up with the practice pharmacist at your provider office as recommended.      .  COMPLETED: Obtain annual  Lipid Profile, LDL-C        Annual lipid profile 05/16/20    .  COMPLETED: Obtain Annual Eye (retinal)  Exam         Annual eye exam completed August 2021 per client    .  Obtain Annual Foot Exam        It is important that your doctor check your feet regularly.  Discuss this with your doctor at your next visit.  . Diabetes can affect the nerves in your feet, causing decreased feeling or numbness. . Check your feet and in-between toes daily for cuts, bruises, redness, blisters or sores. If you can't reach them, use a mirror.  . Wash feet with soap and water, dry feet well especially between toes. Don't use too much lotion.  . Wear shoes that are not too tight and  don't walk barefoot.    .  COMPLETED: Obtain annual screen for micro albuminuria (urine) , nephropathy (kidney problems)        Your last micro albuminuria completed 05/16/20    .  COMPLETED: Obtain Hemoglobin A1C at least 2 times per year        Hgb A1c 05/16/20 was 11.2 % and 08/04/20 was 9.3%    .  Visit Primary Care Provider or Endocrinologist at least 2 times per year         Client reports adherence to provider appointments. Primary care provider visit June 2021 Continue to maintain and keep  follow up visits with your providers.         Plan:  Send successful outreach letter with a copy of their individualized care plan, Send individual care plan to provider and Send educational material  Chronic care management coordination will outreach in:  3 Months   Quinn Plowman RN,BSN,CCM [place] [facility] [phone]

## 2020-08-10 NOTE — H&P (Signed)
Toni [name]  Location: [facility] Patient #: [id number] DOB: [birth year] Undefined / Language: Toni [name] / Race: Black or African American Female   History of Present Illness   The patient is a 64 year old female who presents with breast cancer.    Chief complaint: Right breast cancer  This is a 64 year old female who was recently found on screening mammography to have a mass in the right breast. She underwent an ultrasound of this showing it to be 0.7 cm. She then underwent a biopsy showing a right breast invasive mammary cancer. It was weakly ER positive at 30%, PR negative, and HER-2 positive. The Ki-67 was 40%. She has no prior history of breast cancer there is a significant history of cancer in her family including her mother. She has no problems regarding her breast and denies nipple discharge. Her medical history significant for a idiopathic cardiomyopathy with CHF. She has a defibrillator in place. She is only on a baby aspirin.   Past Surgical History Breast Biopsy  Right. Hysterectomy (not due to cancer) - Partial   Diagnostic Studies History Colonoscopy  5-10 years ago Mammogram  within last year Pap Smear  never  Medication History  Medications Reconciled  Social History  Alcohol use  Occasional alcohol use. Caffeine use  Coffee, Tea. No drug use  Tobacco use  Former smoker.  Family History  Breast Cancer  Mother. Diabetes Mellitus  Father, Mother. Hypertension  Mother. Prostate Cancer  Brother. Respiratory Condition  Mother.  Pregnancy / Birth History  Age at menarche  57 years. Contraceptive History  Oral contraceptives. Gravida  0  Other Problems  Anxiety Disorder  Congestive Heart Failure  Diabetes Mellitus  Hemorrhoids  High blood pressure  Hypercholesterolemia  Kidney Stone     Review of Systems  General Present- Fatigue and Night Sweats. Not Present- Appetite Loss, Chills, Fever, Weight Gain  and Weight Loss. Skin Present- Dryness. Not Present- Change in Wart/Mole, Hives, Jaundice, New Lesions, Non-Healing Wounds, Rash and Ulcer. HEENT Present- Seasonal Allergies. Not Present- Earache, Hearing Loss, Hoarseness, Nose Bleed, Oral Ulcers, Ringing in the Ears, Sinus Pain, Sore Throat, Visual Disturbances, Wears glasses/contact lenses and Yellow Eyes. Breast Not Present- Breast Mass, Breast Pain, Nipple Discharge and Skin Changes. Cardiovascular Not Present- Chest Pain, Difficulty Breathing Lying Down, Leg Cramps, Palpitations, Rapid Heart Rate, Shortness of Breath and Swelling of Extremities. Gastrointestinal Present- Bloating and Constipation. Not Present- Abdominal Pain, Bloody Stool, Change in Bowel Habits, Chronic diarrhea, Difficulty Swallowing, Excessive gas, Gets full quickly at meals, Hemorrhoids, Indigestion, Nausea, Rectal Pain and Vomiting. Female Genitourinary Not Present- Frequency, Nocturia, Painful Urination, Pelvic Pain and Urgency. Musculoskeletal Present- Muscle Weakness. Not Present- Back Pain, Joint Pain, Joint Stiffness, Muscle Pain and Swelling of Extremities. Neurological Not Present- Decreased Memory, Fainting, Headaches, Numbness, Seizures, Tingling, Tremor, Trouble walking and Weakness. Psychiatric Not Present- Anxiety, Bipolar, Change in Sleep Pattern, Depression, Fearful and Frequent crying. Endocrine Present- Hair Changes and Hot flashes. Not Present- Cold Intolerance, Excessive Hunger, Heat Intolerance and New Diabetes. Hematology Not Present- Blood Thinners, Easy Bruising, Excessive bleeding, Gland problems, HIV and Persistent Infections. All other systems negative   Physical Exam  The physical exam findings are as follows: Note: On physical examination, she is a well-developed female in no acute distress  Breast exam bilaterally. There are no palpable masses in either breast. She has a well-healed incision from the defibrillator on the left upper chest.  There is no axillary adenopathy on either side. 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 I reviewed her pathology results, mammography, and ultrasound. She has a grade 3 invasive ductal carcinoma of the right breast. She was presented in our multidisciplinary breast cancer conference this morning. I discussed the diagnosis with her in detail. We discussed both breast conservation and mastectomy. As the mass is quite small, she is a candidate for breast conservation. I discussed proceeding with a right breast radioactive seed guided lumpectomy and sentinel lymph node biopsy. I discussed the procedures with her in detail. I discussed the risks which includes but is not limited to bleeding, infection, the need for further surgery if nodes or margins are positive, injury to surrounding structures, cardiopulmonary issues, etc. She will need cardiac clearance regarding her defibrillator. It is also recommended she undergo genetic testing. After long discussion, she agrees with the plans. Surgery will tentatively be scheduled.

## 2020-08-11 ENCOUNTER — Encounter (HOSPITAL_COMMUNITY)
Admission: RE | Admit: 2020-08-11 | Discharge: 2020-08-11 | Disposition: A | Discharge status: Home / Self Care | Payer: HMO | Source: Ambulatory Visit | Attending: Surgery | Admitting: Surgery

## 2020-08-11 ENCOUNTER — Other Ambulatory Visit: Payer: Self-pay

## 2020-08-11 ENCOUNTER — Ambulatory Visit (HOSPITAL_COMMUNITY): Payer: HMO | Admitting: Vascular Surgery

## 2020-08-11 ENCOUNTER — Ambulatory Visit (HOSPITAL_COMMUNITY)
Admission: RE | Admit: 2020-08-11 | Discharge: 2020-08-11 | Disposition: A | Discharge status: Home / Self Care | Payer: HMO | Source: Ambulatory Visit | Attending: Surgery | Admitting: Surgery

## 2020-08-11 ENCOUNTER — Encounter (HOSPITAL_COMMUNITY)
Admission: RE | Disposition: A | Discharge status: Home / Self Care | Payer: Self-pay | Source: Ambulatory Visit | Attending: Surgery

## 2020-08-11 ENCOUNTER — Ambulatory Visit
Admission: RE | Admit: 2020-08-11 | Discharge: 2020-08-11 | Disposition: A | Discharge status: Home / Self Care | Payer: No Typology Code available for payment source | Source: Ambulatory Visit | Attending: Surgery | Admitting: Surgery

## 2020-08-11 ENCOUNTER — Encounter (HOSPITAL_COMMUNITY): Payer: Self-pay | Admitting: Surgery

## 2020-08-11 ENCOUNTER — Ambulatory Visit (HOSPITAL_COMMUNITY): Payer: HMO | Admitting: Anesthesiology

## 2020-08-11 DIAGNOSIS — Z853 Personal history of malignant neoplasm of breast: Secondary | ICD-10-CM

## 2020-08-11 DIAGNOSIS — Z833 Family history of diabetes mellitus: Secondary | ICD-10-CM | POA: Insufficient documentation

## 2020-08-11 DIAGNOSIS — I10 Essential (primary) hypertension: Secondary | ICD-10-CM | POA: Diagnosis not present

## 2020-08-11 DIAGNOSIS — Z87891 Personal history of nicotine dependence: Secondary | ICD-10-CM | POA: Insufficient documentation

## 2020-08-11 DIAGNOSIS — E119 Type 2 diabetes mellitus without complications: Secondary | ICD-10-CM | POA: Diagnosis not present

## 2020-08-11 DIAGNOSIS — I509 Heart failure, unspecified: Secondary | ICD-10-CM | POA: Insufficient documentation

## 2020-08-11 DIAGNOSIS — Z8042 Family history of malignant neoplasm of prostate: Secondary | ICD-10-CM | POA: Diagnosis not present

## 2020-08-11 DIAGNOSIS — G8918 Other acute postprocedural pain: Secondary | ICD-10-CM | POA: Diagnosis not present

## 2020-08-11 DIAGNOSIS — Z803 Family history of malignant neoplasm of breast: Secondary | ICD-10-CM | POA: Diagnosis not present

## 2020-08-11 DIAGNOSIS — C50911 Malignant neoplasm of unspecified site of right female breast: Secondary | ICD-10-CM | POA: Insufficient documentation

## 2020-08-11 DIAGNOSIS — Z170 Estrogen receptor positive status [ER+]: Secondary | ICD-10-CM | POA: Diagnosis not present

## 2020-08-11 DIAGNOSIS — I429 Cardiomyopathy, unspecified: Secondary | ICD-10-CM | POA: Insufficient documentation

## 2020-08-11 DIAGNOSIS — R928 Other abnormal and inconclusive findings on diagnostic imaging of breast: Secondary | ICD-10-CM | POA: Diagnosis not present

## 2020-08-11 DIAGNOSIS — C50511 Malignant neoplasm of lower-outer quadrant of right female breast: Secondary | ICD-10-CM | POA: Diagnosis not present

## 2020-08-11 DIAGNOSIS — Z90711 Acquired absence of uterus with remaining cervical stump: Secondary | ICD-10-CM | POA: Insufficient documentation

## 2020-08-11 HISTORY — PX: BREAST LUMPECTOMY WITH RADIOACTIVE SEED AND SENTINEL LYMPH NODE BIOPSY: SHX6550

## 2020-08-11 LAB — GLUCOSE, CAPILLARY
Glucose-Capillary: 123 mg/dL — ABNORMAL HIGH (ref 70–99)
Glucose-Capillary: 96 mg/dL (ref 70–99)

## 2020-08-11 SURGERY — BREAST LUMPECTOMY WITH RADIOACTIVE SEED AND SENTINEL LYMPH NODE BIOPSY
Anesthesia: General | Site: Breast | Laterality: Right

## 2020-08-11 MED ORDER — CHLORHEXIDINE GLUCONATE CLOTH 2 % EX PADS: 6.0000 | MEDICATED_PAD | Freq: Once | CUTANEOUS | Status: DC

## 2020-08-11 MED ORDER — CEFAZOLIN SODIUM-DEXTROSE 2-4 GM/100ML-% IV SOLN
2.0000 g | INTRAVENOUS | Status: AC
  Administered 2020-08-11: 2 g via INTRAVENOUS

## 2020-08-11 MED ORDER — STERILE WATER FOR IRRIGATION IR SOLN
Status: DC | PRN
  Administered 2020-08-11: 1000 mL

## 2020-08-11 MED ORDER — GABAPENTIN 300 MG PO CAPS
ORAL_CAPSULE | ORAL | Status: AC
  Administered 2020-08-11: 300 mg via ORAL
  Filled 2020-08-11: qty 1

## 2020-08-11 MED ORDER — OXYCODONE HCL 5 MG/5ML PO SOLN: 5.0000 mg | Freq: Once | ORAL | Status: DC | PRN

## 2020-08-11 MED ORDER — EPHEDRINE SULFATE-NACL 50-0.9 MG/10ML-% IV SOSY
PREFILLED_SYRINGE | INTRAVENOUS | Status: DC | PRN
  Administered 2020-08-11: 5 mg via INTRAVENOUS
  Administered 2020-08-11 (×3): 10 mg via INTRAVENOUS
  Administered 2020-08-11 (×2): 5 mg via INTRAVENOUS

## 2020-08-11 MED ORDER — BUPIVACAINE HCL (PF) 0.25 % IJ SOLN
INTRAMUSCULAR | Status: AC
  Filled 2020-08-11: qty 30

## 2020-08-11 MED ORDER — ONDANSETRON HCL 4 MG/2ML IJ SOLN
INTRAMUSCULAR | Status: DC | PRN
  Administered 2020-08-11: 4 mg via INTRAVENOUS

## 2020-08-11 MED ORDER — CEFAZOLIN SODIUM-DEXTROSE 2-4 GM/100ML-% IV SOLN
INTRAVENOUS | Status: AC
  Filled 2020-08-11: qty 100

## 2020-08-11 MED ORDER — FENTANYL CITRATE (PF) 250 MCG/5ML IJ SOLN
INTRAMUSCULAR | Status: AC
  Filled 2020-08-11: qty 5

## 2020-08-11 MED ORDER — TRAMADOL HCL 50 MG PO TABS: 50.0000 mg | ORAL_TABLET | Freq: Four times a day (QID) | ORAL | 0 refills | Status: DC | PRN

## 2020-08-11 MED ORDER — TECHNETIUM TC 99M SULFUR COLLOID FILTERED
1.0000 | Freq: Once | INTRAVENOUS | Status: AC | PRN
  Administered 2020-08-11: 1 via INTRADERMAL

## 2020-08-11 MED ORDER — PROPOFOL 10 MG/ML IV BOLUS
INTRAVENOUS | Status: AC
  Filled 2020-08-11: qty 40

## 2020-08-11 MED ORDER — MIDAZOLAM HCL 2 MG/2ML IJ SOLN
INTRAMUSCULAR | Status: AC
  Filled 2020-08-11: qty 2

## 2020-08-11 MED ORDER — ENSURE PRE-SURGERY PO LIQD: 296.0000 mL | Freq: Once | ORAL | Status: DC

## 2020-08-11 MED ORDER — PROPOFOL 10 MG/ML IV BOLUS
INTRAVENOUS | Status: DC | PRN
  Administered 2020-08-11: 30 mg via INTRAVENOUS
  Administered 2020-08-11: 70 mg via INTRAVENOUS

## 2020-08-11 MED ORDER — 0.9 % SODIUM CHLORIDE (POUR BTL) OPTIME
TOPICAL | Status: DC | PRN
  Administered 2020-08-11: 1000 mL

## 2020-08-11 MED ORDER — FENTANYL CITRATE (PF) 250 MCG/5ML IJ SOLN
INTRAMUSCULAR | Status: DC | PRN
Start: 2020-08-11 — End: 2020-08-11
  Administered 2020-08-11: 25 ug via INTRAVENOUS
  Administered 2020-08-11: 50 ug via INTRAVENOUS
  Administered 2020-08-11: 25 ug via INTRAVENOUS

## 2020-08-11 MED ORDER — PHENYLEPHRINE HCL-NACL 10-0.9 MG/250ML-% IV SOLN
INTRAVENOUS | Status: DC | PRN
  Administered 2020-08-11: 25 ug/min via INTRAVENOUS

## 2020-08-11 MED ORDER — BUPIVACAINE HCL (PF) 0.25 % IJ SOLN
INTRAMUSCULAR | Status: DC | PRN
  Administered 2020-08-11: 13 mL

## 2020-08-11 MED ORDER — FENTANYL CITRATE (PF) 100 MCG/2ML IJ SOLN: 25.0000 ug | INTRAMUSCULAR | Status: DC | PRN

## 2020-08-11 MED ORDER — OXYCODONE HCL 5 MG PO TABS: 5.0000 mg | ORAL_TABLET | Freq: Once | ORAL | Status: DC | PRN

## 2020-08-11 MED ORDER — MIDAZOLAM HCL 5 MG/5ML IJ SOLN
INTRAMUSCULAR | Status: DC | PRN
  Administered 2020-08-11: 2 mg via INTRAVENOUS

## 2020-08-11 MED ORDER — ACETAMINOPHEN 500 MG PO TABS
ORAL_TABLET | ORAL | Status: AC
  Administered 2020-08-11: 1000 mg via ORAL
  Filled 2020-08-11: qty 2

## 2020-08-11 MED ORDER — CHLORHEXIDINE GLUCONATE 0.12 % MT SOLN
OROMUCOSAL | Status: AC
  Administered 2020-08-11: 15 mL
  Filled 2020-08-11: qty 15

## 2020-08-11 MED ORDER — METHYLENE BLUE 0.5 % INJ SOLN
INTRAVENOUS | Status: AC
  Filled 2020-08-11: qty 10

## 2020-08-11 MED ORDER — ACETAMINOPHEN 500 MG PO TABS: 1000.0000 mg | ORAL_TABLET | ORAL | Status: AC

## 2020-08-11 MED ORDER — PHENYLEPHRINE 40 MCG/ML (10ML) SYRINGE FOR IV PUSH (FOR BLOOD PRESSURE SUPPORT)
PREFILLED_SYRINGE | INTRAVENOUS | Status: DC | PRN
  Administered 2020-08-11: 80 ug via INTRAVENOUS
  Administered 2020-08-11 (×2): 40 ug via INTRAVENOUS
  Administered 2020-08-11: 80 ug via INTRAVENOUS

## 2020-08-11 MED ORDER — DEXAMETHASONE SODIUM PHOSPHATE 10 MG/ML IJ SOLN
INTRAMUSCULAR | Status: DC | PRN
  Administered 2020-08-11: 4 mg via INTRAVENOUS

## 2020-08-11 MED ORDER — BUPIVACAINE LIPOSOME 1.3 % IJ SUSP
INTRAMUSCULAR | Status: DC | PRN
  Administered 2020-08-11: 10 mL

## 2020-08-11 MED ORDER — BUPIVACAINE HCL (PF) 0.5 % IJ SOLN
INTRAMUSCULAR | Status: DC | PRN
  Administered 2020-08-11: 15 mL

## 2020-08-11 MED ORDER — LACTATED RINGERS IV SOLN: INTRAVENOUS | Status: DC | PRN

## 2020-08-11 MED ORDER — ONDANSETRON HCL 4 MG/2ML IJ SOLN: 4.0000 mg | Freq: Once | INTRAMUSCULAR | Status: DC | PRN

## 2020-08-11 MED ORDER — GABAPENTIN 300 MG PO CAPS: 300.0000 mg | ORAL_CAPSULE | ORAL | Status: AC

## 2020-08-11 SURGICAL SUPPLY — 43 items
ADH SKN CLS APL DERMABOND .7 (GAUZE/BANDAGES/DRESSINGS) ×1
APL PRP STRL LF DISP 70% ISPRP (MISCELLANEOUS) ×1
APPLIER CLIP 9.375 MED OPEN (MISCELLANEOUS) ×2
APR CLP MED 9.3 20 MLT OPN (MISCELLANEOUS) ×1
BINDER BREAST LRG (GAUZE/BANDAGES/DRESSINGS) IMPLANT
BINDER BREAST XLRG (GAUZE/BANDAGES/DRESSINGS) IMPLANT
CANISTER SUCT 3000ML PPV (MISCELLANEOUS) ×2 IMPLANT
CHLORAPREP W/TINT 26 (MISCELLANEOUS) ×2 IMPLANT
CLIP APPLIE 9.375 MED OPEN (MISCELLANEOUS) ×1 IMPLANT
CNTNR URN SCR LID CUP LEK RST (MISCELLANEOUS) IMPLANT
CONT SPEC 4OZ STRL OR WHT (MISCELLANEOUS) ×2
COVER PROBE W GEL 5X96 (DRAPES) ×2 IMPLANT
COVER SURGICAL LIGHT HANDLE (MISCELLANEOUS) ×2 IMPLANT
COVER WAND RF STERILE (DRAPES) ×1 IMPLANT
DERMABOND ADVANCED (GAUZE/BANDAGES/DRESSINGS) ×1
DERMABOND ADVANCED .7 DNX12 (GAUZE/BANDAGES/DRESSINGS) ×1 IMPLANT
DEVICE DUBIN SPECIMEN MAMMOGRA (MISCELLANEOUS) ×2 IMPLANT
DRAPE CHEST BREAST 15X10 FENES (DRAPES) ×2 IMPLANT
ELECT CAUTERY BLADE 6.4 (BLADE) ×2 IMPLANT
ELECT REM PT RETURN 9FT ADLT (ELECTROSURGICAL) ×2
ELECTRODE REM PT RTRN 9FT ADLT (ELECTROSURGICAL) ×1 IMPLANT
GLOVE SURG SIGNA 7.5 PF LTX (GLOVE) ×2 IMPLANT
GOWN STRL REUS W/ TWL LRG LVL3 (GOWN DISPOSABLE) ×1 IMPLANT
GOWN STRL REUS W/ TWL XL LVL3 (GOWN DISPOSABLE) ×1 IMPLANT
GOWN STRL REUS W/TWL LRG LVL3 (GOWN DISPOSABLE) ×2
GOWN STRL REUS W/TWL XL LVL3 (GOWN DISPOSABLE) ×2
KIT BASIN OR (CUSTOM PROCEDURE TRAY) ×2 IMPLANT
KIT MARKER MARGIN INK (KITS) ×2 IMPLANT
NDL 18GX1X1/2 (RX/OR ONLY) (NEEDLE) IMPLANT
NDL FILTER BLUNT 18X1 1/2 (NEEDLE) IMPLANT
NDL HYPO 25GX1X1/2 BEV (NEEDLE) ×1 IMPLANT
NEEDLE 18GX1X1/2 (RX/OR ONLY) (NEEDLE) IMPLANT
NEEDLE FILTER BLUNT 18X 1/2SAF (NEEDLE)
NEEDLE FILTER BLUNT 18X1 1/2 (NEEDLE) IMPLANT
NEEDLE HYPO 25GX1X1/2 BEV (NEEDLE) ×2 IMPLANT
NS IRRIG 1000ML POUR BTL (IV SOLUTION) ×2 IMPLANT
PACK GENERAL/GYN (CUSTOM PROCEDURE TRAY) ×2 IMPLANT
SET TUBE SMOKE EVAC HIGH FLOW (TUBING) ×1 IMPLANT
SUT MNCRL AB 4-0 PS2 18 (SUTURE) ×3 IMPLANT
SUT VIC AB 3-0 SH 18 (SUTURE) ×2 IMPLANT
SYR CONTROL 10ML LL (SYRINGE) ×2 IMPLANT
TOWEL GREEN STERILE (TOWEL DISPOSABLE) ×2 IMPLANT
TOWEL GREEN STERILE FF (TOWEL DISPOSABLE) ×2 IMPLANT

## 2020-08-11 NOTE — Interval H&P Note (Signed)
History and Physical Interval Note: no change in H and P  08/11/2020 6:42 AM  Toni [name]  has presented today for surgery, with the diagnosis of RIGHT BREAST CANCER.  The various methods of treatment have been discussed with the patient and family. After consideration of risks, benefits and other options for treatment, the patient has consented to  Procedure(s): RIGHT BREAST LUMPECTOMY WITH RADIOACTIVE SEED AND SENTINEL LYMPH NODE BIOPSY (Right) as a surgical intervention.  The patient's history has been reviewed, patient examined, no change in status, stable for surgery.  I have reviewed the patient's chart and labs.  Questions were answered to the patient's satisfaction.     Coralie Keens

## 2020-08-11 NOTE — Op Note (Addendum)
RIGHT BREAST LUMPECTOMY WITH RADIOACTIVE SEED AND DEEP RIGHT AXILLARY SENTINEL LYMPH NODE BIOPSY  Procedure Note  Toni [name] 08/11/2020   Pre-op Diagnosis: RIGHT BREAST CANCER     Post-op Diagnosis: same  Procedure(s): RIGHT BREAST LUMPECTOMY WITH RADIOACTIVE SEED AND DEEP RIGHT AXILLARY SENTINEL LYMPH NODE BIOPSY  Surgeon(s): Coralie Keens, MD  Anesthesia: General  Staff:  Circulator: Antonietta Jewel, RN Scrub Person: Arta Silence Circulator Assistant: [place], Karrie S, RN  Estimated Blood Loss: Minimal               Specimens: sent to path  Indications: This is a 64 year old female who presents with a right breast cancer.  A small mass was seen in the right breast on screening mammography.  A biopsy showed invasive ductal carcinoma.  It was approximately 7 mm in size.  The decision was made to proceed with a radioactive seed guided right breast lumpectomy and sentinel lymph node biopsy  Procedure: The patient was identified in the preoperative holding area and injected for the sentinel lymph node in the right breast by the radiation technologist.  She was then taken to the operating room.  She is placed upon the operating table general anesthesia was induced.  Her right breast and axilla were then prepped and draped in usual sterile fashion.  I located the radioactive seed with aid of neoprobe at the 6 o'clock position of the right breast.  I anesthetized the lower edge of the areola with Marcaine.  I then made a circumareolar incision with a scalpel.  I then dissected down to the mass with the aid of the neoprobe.  I widely excised around the mass going down the chest wall with the cautery and the aid of neoprobe removing the mass and the radioactive seed.  Once the specimen was removed I marked all margins with marker paint.  An x-ray was performed on specimen confirming the radioactive seed and previous biopsy clip were in the specimen.  The lumpectomy specimen was  then sent to pathology for evaluation.  I achieved hemostasis with cautery.  Next I turned my attention toward the right axilla.  Identified an area of increased uptake in the right axilla.  I anesthetized skin with Marcaine and made incision with a scalpel.  I then dissected into the deep axillary tissue.  There was 1 enlarged lymph node in 2 separate smaller lymph nodes together which I excised with the cautery.  These were the sentinel lymph nodes.  I then examined nodal basin and found no other increased uptake.  There were no other palpable nodes.  I achieved hemostasis with cautery and surgical clip.  The lymph nodes were sent to pathology as well. I next placed clips in the biopsy cavity for marking purposes on the right breast.  I then closed subcutaneous tissue of both incision with interrupted 3-0 Vicryl sutures and closed skin with running 4-0 Monocryl.  Dermabond was then applied.  The patient tolerated procedure well.  All the counts were correct at the end of the procedure.  The patient was then extubated in the operating room and taken in a stable condition to the recovery room.          Coralie Keens   Date: 08/11/2020  Time: 8:23 AM

## 2020-08-11 NOTE — Anesthesia Procedure Notes (Signed)
Procedure Name: LMA Insertion Date/Time: 08/11/2020 7:33 AM Performed by: Wilburn Cornelia, CRNA Pre-anesthesia Checklist: Patient identified, Emergency Drugs available, Suction available, Patient being monitored and Timeout performed Patient Re-evaluated:Patient Re-evaluated prior to induction Oxygen Delivery Method: Circle system utilized Preoxygenation: Pre-oxygenation with 100% oxygen Induction Type: IV induction Ventilation: Mask ventilation without difficulty LMA: LMA inserted LMA Size: 4.0 Number of attempts: 1 Placement Confirmation: positive ETCO2,  CO2 detector and breath sounds checked- equal and bilateral Tube secured with: Tape Dental Injury: Teeth and Oropharynx as per pre-operative assessment

## 2020-08-11 NOTE — Anesthesia Procedure Notes (Signed)
Anesthesia Regional Block: Pectoralis block   Pre-Anesthetic Checklist: ,, timeout performed, Correct Patient, Correct Site, Correct Laterality, Correct Procedure, Correct Position, site marked, Risks and benefits discussed,  Surgical consent,  Pre-op evaluation,  At surgeon's request and post-op pain management  Laterality: Right  Prep: chloraprep       Needles:  Injection technique: Single-shot  Needle Type: Echogenic Needle     Needle Length: 10cm  Needle Gauge: 21     Additional Needles:   Narrative:  Start time: 08/11/2020 7:08 AM End time: 08/11/2020 7:11 AM Injection made incrementally with aspirations every 5 mL.  Performed by: Personally  Anesthesiologist: Audry Pili, MD  Additional Notes: No pain on injection. No increased resistance to injection. Injection made in 5cc increments. Good needle visualization. Patient tolerated the procedure well.

## 2020-08-11 NOTE — Transfer of Care (Signed)
Immediate Anesthesia Transfer of Care Note  Patient: Toni [name]  Procedure(s) Performed: RIGHT BREAST LUMPECTOMY WITH RADIOACTIVE SEED AND SENTINEL LYMPH NODE BIOPSY (Right Breast)  Patient Location: PACU  Anesthesia Type:GA combined with regional for post-op pain  Level of Consciousness: awake and alert   Airway & Oxygen Therapy: Patient Spontanous Breathing and Patient connected to nasal cannula oxygen  Post-op Assessment: Report given to RN and Post -op Vital signs reviewed and stable  Post vital signs: Reviewed and stable  Last Vitals:  Vitals Value Taken Time  BP 124/74 08/11/20 0831  Temp    Pulse 62 08/11/20 0832  Resp 12 08/11/20 0832  SpO2 100 % 08/11/20 0832  Vitals shown include unvalidated device data.  Last Pain:  Vitals:   08/11/20 0558  TempSrc:   PainSc: 0-No pain         Complications: No complications documented.

## 2020-08-11 NOTE — Discharge Instructions (Signed)
[place] Office Phone Number [phone]  BREAST BIOPSY/ PARTIAL MASTECTOMY: POST OP INSTRUCTIONS  Always review your discharge instruction sheet given to you by the facility where your surgery was performed.  IF YOU HAVE DISABILITY OR FAMILY LEAVE FORMS, YOU MUST BRING THEM TO THE OFFICE FOR PROCESSING.  DO NOT GIVE THEM TO YOUR DOCTOR.  1. A prescription for pain medication may be given to you upon discharge.  Take your pain medication as prescribed, if needed.  If narcotic pain medicine is not needed, then you may take acetaminophen (Tylenol) or ibuprofen (Advil) as needed. 2. Take your usually prescribed medications unless otherwise directed 3. If you need a refill on your pain medication, please contact your pharmacy.  They will contact our office to request authorization.  Prescriptions will not be filled after 5pm or on week-ends. 4. You should eat very light the first 24 hours after surgery, such as soup, crackers, pudding, etc.  Resume your normal diet the day after surgery. 5. Most patients will experience some swelling and bruising in the breast.  Ice packs and a good support bra will help.  Swelling and bruising can take several days to resolve.  6. It is common to experience some constipation if taking pain medication after surgery.  Increasing fluid intake and taking a stool softener will usually help or prevent this problem from occurring.  A mild laxative (Milk of Magnesia or Miralax) should be taken according to package directions if there are no bowel movements after 48 hours. 7. Unless discharge instructions indicate otherwise, you may remove your bandages 24-48 hours after surgery, and you may shower at that time.  You may have steri-strips (small skin tapes) in place directly over the incision.  These strips should be left on the skin for 7-10 days.  If your surgeon used skin glue on the incision, you may shower in 24 hours.  The glue will flake off over the  next 2-3 weeks.  Any sutures or staples will be removed at the office during your follow-up visit. 8. ACTIVITIES:  You may resume regular daily activities (gradually increasing) beginning the next day.  Wearing a good support bra or sports bra minimizes pain and swelling.  You may have sexual intercourse when it is comfortable. a. You may drive when you no longer are taking prescription pain medication, you can comfortably wear a seatbelt, and you can safely maneuver your car and apply brakes. b. RETURN TO WORK:  ______________________________________________________________________________________ 9. You should see your doctor in the office for a follow-up appointment approximately two weeks after your surgery.  Your doctor's nurse will typically make your follow-up appointment when she calls you with your pathology report.  Expect your pathology report 2-3 business days after your surgery.  You may call to check if you do not hear from [place] after three days. 10. OTHER INSTRUCTIONS:OK TO SHOWER STARTING TOMORROW 11. ICE PACK, TYLENOL ALSO FOR PAIN 12. NO VIGOROUS ACTIVITY FOR ONE WEEK _______________________________________________________________________________________________ _____________________________________________________________________________________________________________________________________ _____________________________________________________________________________________________________________________________________ _____________________________________________________________________________________________________________________________________  WHEN TO CALL YOUR DOCTOR: 1. Fever over 101.0 2. Nausea and/or vomiting. 3. Extreme swelling or bruising. 4. Continued bleeding from incision. 5. Increased pain, redness, or drainage from the incision.  The clinic staff is available to answer your questions during regular business hours.  Please don't hesitate to call and ask to  speak to one of the nurses for clinical concerns.  If you have a medical emergency, go to the nearest emergency room or call 911.  A surgeon from  [facility] is always on call at the hospital.  For further questions, please visit [email or web address]

## 2020-08-11 NOTE — Anesthesia Postprocedure Evaluation (Signed)
Anesthesia Post Note  Patient: Toni [name]  Procedure(s) Performed: RIGHT BREAST LUMPECTOMY WITH RADIOACTIVE SEED AND SENTINEL LYMPH NODE BIOPSY (Right Breast)     Patient location during evaluation: PACU Anesthesia Type: General Level of consciousness: awake and alert Pain management: pain level controlled Vital Signs Assessment: post-procedure vital signs reviewed and stable Respiratory status: spontaneous breathing, nonlabored ventilation and respiratory function stable Cardiovascular status: blood pressure returned to baseline and stable Postop Assessment: no apparent nausea or vomiting Anesthetic complications: no   No complications documented.  Last Vitals:  Vitals:   08/11/20 0915 08/11/20 0930  BP: (!) 146/67 (!) 141/68  Pulse: 64 68  Resp: 12 14  Temp:  36.7 C  SpO2: 100% 95%    Last Pain:  Vitals:   08/11/20 0930  TempSrc:   PainSc: 0-No pain                 Audry Pili

## 2020-08-12 ENCOUNTER — Encounter (HOSPITAL_COMMUNITY): Payer: Self-pay | Admitting: Surgery

## 2020-08-15 LAB — SURGICAL PATHOLOGY

## 2020-08-17 NOTE — Progress Notes (Signed)
Patient Care Team: Donald Prose, MD as PCP - General (Family Medicine) Belva Crome, MD as PCP - Cardiology (Cardiology) Larey Dresser, MD as PCP - Advanced Heart Failure (Cardiology) Dannielle Karvonen, RN as [place] Management Tressie Ellis, Paulette Blanch, RN as Registered Nurse Rockwell Germany, RN as Oncology Nurse Navigator Coralie Keens, MD as Consulting Physician (General Surgery) Nicholas Lose, MD as Consulting Physician (Hematology and Oncology) Kyung Rudd, MD as Consulting Physician (Radiation Oncology)  DIAGNOSIS:    ICD-10-CM   1. Malignant neoplasm of lower-outer quadrant of right breast of female, estrogen receptor positive ([place])  C50.511    Z17.0     SUMMARY OF ONCOLOGIC HISTORY: Oncology History  Malignant neoplasm of lower-outer quadrant of right breast of female, estrogen receptor positive ([place])  07/01/2020 Initial Diagnosis   Screening mammogram showed a right breast mass. Mammogram and US showed a 0.7cm mass at the 6 o'clock position in the right breast, no axillary adenopathy. 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    07/06/2020 Cancer Staging   Staging form: Breast, AJCC 8th Edition - Clinical stage from 07/06/2020: Stage IB (cT1b, cN0, cM0, G3, ER+, PR-, HER2-) - Signed by Nicholas Lose, MD on 07/06/2020   07/26/2020 Genetic Testing   Negative genetic testing:  No pathogenic variants detected on the [facility] Common Hereditary Cancers Panel. The report date is 07/26/2020.   The Common Hereditary Cancers Panel offered by [facility] includes sequencing and/or deletion duplication testing of the following 48 genes: APC, ATM, AXIN2, BARD1, BMPR1A, BRCA1, BRCA2, BRIP1, CDH1, CDK4, CDKN2A (p14ARF), CDKN2A (p16INK4a), CHEK2, CTNNA1, DICER1, EPCAM (Deletion/duplication testing only), GREM1 (promoter region deletion/duplication testing only), KIT, MEN1, MLH1, MSH2, MSH3, MSH6,  MUTYH, NBN, NF1, NTHL1, PALB2, PDGFRA, PMS2, POLD1, POLE, PTEN, RAD50, RAD51C, RAD51D, RNF43, SDHB, SDHC, SDHD, SMAD4, SMARCA4. STK11, TP53, TSC1, TSC2, and VHL.  The following genes were evaluated for sequence changes only: SDHA and HOXB13 c.251G>A variant only.   08/11/2020 Surgery   Right lumpectomy [name]): IDC, grade 3, 0.8cm, clear margins, 3 right axillary lymph nodes negative for carcinoma. ER 30% week, PR 0%, HER-2 positive, Ki-67 40%   08/18/2020 Cancer Staging   Staging form: Breast, AJCC 8th Edition - Pathologic stage from 08/18/2020: Stage IA (pT1b, pN0, cM0, G3, ER+, PR-, HER2-) - Signed by Nicholas Lose, MD on 08/18/2020     CHIEF COMPLIANT: Follow-up s/p right lumpectomy   INTERVAL HISTORY: Toni [name] is a 64 y.o. with above-mentioned history of right breast cancer. She underwent a right lumpectomy with Dr. [name] on 08/11/20 for which pathology showed invasive ductal carcinoma, grade 3, 0.8cm, clear margins, 3 right axillary lymph nodes negative for carcinoma. She presents to the clinic today to discuss the pathology report and further treatment.  She is recovering very well from the surgery.  Denies any pain or discomfort.  ALLERGIES:  is allergic to biaxin [clarithromycin], enalapril maleate, and vasotec [enalapril].  MEDICATIONS:  Current Outpatient Medications  Medication Sig Dispense Refill  . aspirin 81 MG chewable tablet Chew 1 tablet (81 mg total) by mouth daily. 30 tablet 0  . carvedilol (COREG) 25 MG tablet TAKE 1 TABLET (25 MG TOTAL) BY MOUTH 2 (TWO) TIMES DAILY WITH A MEAL. 180 tablet 3  . cetirizine (ZYRTEC) 10 MG tablet Take 10 mg by mouth daily.     . digoxin (LANOXIN) 0.125 MG tablet Take 0.5 tablets (0.0625 mg total)  by mouth daily. 45 tablet 1  . furosemide (LASIX) 20 MG tablet Take 20 mg by mouth daily as needed for fluid or edema.     Marland Kitchen glipiZIDE (GLUCOTROL XL) 10 MG 24 hr tablet Take 20 mg by mouth daily with breakfast.     . hydrALAZINE  (APRESOLINE) 50 MG tablet TAKE 1 TABLET (50 MG TOTAL) BY MOUTH 3 (THREE) TIMES DAILY. 270 tablet 1  . Insulin Glargine (BASAGLAR KWIKPEN) 100 UNIT/ML Inject 20 Units into the skin every evening.     . isosorbide dinitrate (ISORDIL) 20 MG tablet TAKE 1 TABLET (20 MG TOTAL) BY MOUTH 3 TIMES DAILY. (Patient taking differently: Take 20 mg by mouth 3 (three) times daily. ) 90 tablet 11  . JARDIANCE 25 MG TABS tablet Take 25 mg by mouth daily.    Marland Kitchen LINZESS 145 MCG CAPS capsule Take 145 mcg by mouth daily as needed (constipation).     Marland Kitchen losartan (COZAAR) 25 MG tablet TAKE 1 TABLET BY MOUTH 2 TIMES A DAY. (Patient taking differently: Take 25 mg by mouth 2 (two) times daily. ) 180 tablet 3  . metFORMIN (GLUCOPHAGE) 1000 MG tablet Take 1,000 mg by mouth 2 (two) times daily with a meal.    . Multiple Vitamin (MULTIVITAMIN WITH MINERALS) TABS tablet Take 1 tablet by mouth at bedtime. Centrum Silver    . Multiple Vitamins-Minerals (HAIR SKIN AND NAILS FORMULA) TABS Take 3 tablets by mouth daily.  (Patient not taking: Reported on 08/10/2020)    . OZEMPIC, 1 MG/DOSE, 4 MG/3ML SOPN Inject 1 mg into the skin once a week. Tuesday    . Scar Treatment Products (SCARAWAY EX) Apply 1 application topically daily as needed (scar).    . simvastatin (ZOCOR) 20 MG tablet Take 20 mg by mouth at bedtime.     Marland Kitchen spironolactone (ALDACTONE) 25 MG tablet TAKE 1 TABLET BY MOUTH EVERY EVENING. (Patient taking differently: Take 25 mg by mouth at bedtime. ) 90 tablet 3  . traMADol (ULTRAM) 50 MG tablet Take 1-2 tablets (50-100 mg total) by mouth every 6 (six) hours as needed for moderate pain. 20 tablet 0  . TRUE METRIX BLOOD GLUCOSE TEST test strip 1 each by Other route as directed.   5  . TRUEPLUS LANCETS 30G MISC 1 each by Other route as directed. Use as directed.  5  . UNIFINE PENTIPS 32G X 4 MM MISC 1 each by Other route as directed.      No current facility-administered medications for this visit.    PHYSICAL EXAMINATION: ECOG  PERFORMANCE STATUS: 1 - Symptomatic but completely ambulatory  Vitals:   08/18/20 1135  BP: 116/77  Pulse: 82  Resp: 18  Temp: 97.8 F (36.6 C)  SpO2: 96%   Filed Weights   08/18/20 1135  Weight: 154 lb 8 oz (70.1 kg)    LABORATORY DATA:  I have reviewed the data as listed CMP Latest Ref Rng & Units 08/04/2020 07/06/2020 05/31/2020  Glucose 70 - 99 mg/dL 204(H) 173(H) 216(H)  BUN 8 - 23 mg/dL _0 Creatinine 0.44 - 1.00 mg/dL 0.84 0.82 0.73  Sodium 135 - 145 mmol/L 141 140 137  Potassium 3.5 - 5.1 mmol/L 4.6 4.5 4.8  Chloride 98 - 111 mmol/L 107 106 102  CO2 22 - 32 mmol/L _1 Calcium 8.9 - 10.3 mg/dL 9.9 10.0 9.7  Total Protein 6.5 - 8.1 g/dL - 7.3 -  Total Bilirubin 0.3 - 1.2 mg/dL - <0.2(L) -  Alkaline Phos 38 - 126 U/L - 62 -  AST 15 - 41 U/L - 13(L) -  ALT 0 - 44 U/L - 12 -    Lab Results  Component Value Date   WBC 5.5 08/04/2020   HGB 14.2 08/04/2020   HCT 46.3 (H) 08/04/2020   MCV 93.7 08/04/2020   PLT 284 08/04/2020   NEUTROABS 3.4 07/06/2020    ASSESSMENT & PLAN:  Malignant neoplasm of lower-outer quadrant of right breast of female, estrogen receptor positive ([place]) 07/01/2020:Screening mammogram showed a right breast mass. Mammogram and US showed a 0.7cm mass at the 6 o'clock position in the right breast, no axillary adenopathy. 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  T1BN0 stage Ia  08/11/2020:Right lumpectomy [name]): IDC, grade 3, 0.8cm, clear margins, 3 right axillary lymph nodes negative for carcinoma.  ER 30% week, PR 0%, HER-2 positive, Ki-67 40%  Treatment plan: 1. adjuvant Taxol Herceptin  2.  Adjuvant radiation therapy 3.  Follow-up adjuvant antiestrogen therapy  Plan: Port placement, echocardiogram, chemo class Participation in neuropathy clinical trial SWOG S 1714  Return to clinic in 3 weeks to start chemo      No orders of the defined  types were placed in this encounter.  The patient has a good understanding of the overall plan. she agrees with it. she will call with any problems that may develop before the next visit here.  Total time spent: 30 mins including face to face time and time spent for planning, charting and coordination of care  Nicholas Lose, MD 08/18/2020  I, Cloyde Reams Dorshimer, am acting as scribe for Dr. Nicholas Lose.  I have reviewed the above documentation for accuracy and completeness, and I agree with the above.

## 2020-08-18 ENCOUNTER — Encounter: Payer: Self-pay | Admitting: Medical Oncology

## 2020-08-18 ENCOUNTER — Other Ambulatory Visit: Payer: Self-pay | Admitting: Surgery

## 2020-08-18 ENCOUNTER — Other Ambulatory Visit: Payer: Self-pay

## 2020-08-18 ENCOUNTER — Inpatient Hospital Stay: Payer: HMO | Attending: Hematology and Oncology | Admitting: Hematology and Oncology

## 2020-08-18 VITALS — BP 116/77 | HR 82 | Temp 97.8°F | Resp 18 | Ht 65.0 in | Wt 154.5 lb

## 2020-08-18 DIAGNOSIS — C50511 Malignant neoplasm of lower-outer quadrant of right female breast: Secondary | ICD-10-CM | POA: Diagnosis not present

## 2020-08-18 DIAGNOSIS — Z923 Personal history of irradiation: Secondary | ICD-10-CM | POA: Diagnosis not present

## 2020-08-18 DIAGNOSIS — Z170 Estrogen receptor positive status [ER+]: Secondary | ICD-10-CM

## 2020-08-18 MED ORDER — LIDOCAINE-PRILOCAINE 2.5-2.5 % EX CREA: TOPICAL_CREAM | CUTANEOUS | 3 refills | Status: DC

## 2020-08-18 MED ORDER — PROCHLORPERAZINE MALEATE 10 MG PO TABS: 10.0000 mg | ORAL_TABLET | Freq: Four times a day (QID) | ORAL | 1 refills | Status: DC | PRN

## 2020-08-18 MED ORDER — ONDANSETRON HCL 8 MG PO TABS: 8.0000 mg | ORAL_TABLET | Freq: Two times a day (BID) | ORAL | 1 refills | Status: DC | PRN

## 2020-08-18 MED FILL — ONDANSETRON HCL 8 MG TABLET: 8 | 15 days supply | Qty: 30 | Fill #0

## 2020-08-18 MED FILL — PROCHLORPERAZINE 10 MG TAB: 10 | 8 days supply | Qty: 30 | Fill #0

## 2020-08-18 MED FILL — LIDOCAINE-PRILOCAINE CREAM: 2.5-2.5 | 30 days supply | Qty: 30 | Fill #0

## 2020-08-18 NOTE — Progress Notes (Signed)
START ON PATHWAY REGIMEN - Breast     Cycle 1: A cycle is 7 days:     Trastuzumab-xxxx      Paclitaxel    Cycles 2 through 12: A cycle is every 7 days:     Trastuzumab-xxxx      Paclitaxel    Cycles 13 through 25: A cycle is every 21 days:     Trastuzumab-xxxx   **Always confirm dose/schedule in your pharmacy ordering system**  Patient Characteristics: Postoperative without Neoadjuvant Therapy (Pathologic Staging), Invasive Disease, Adjuvant Therapy, HER2 Positive, ER Positive, Node Negative, pT1b, pN0/N66m, Chemotherapy Indicated Therapeutic Status: Postoperative without Neoadjuvant Therapy (Pathologic Staging) AJCC Grade: G3 AJCC N Category: pN0 AJCC M Category: cM0 ER Status: Positive (+) AJCC 8 Stage Grouping: IA HER2 Status: Positive (+) Oncotype Dx Recurrence Score: Not Appropriate AJCC T Category: pT1b PR Status: Negative (-) Intervention Indicated: Chemotherapy Intent of Therapy: Curative Intent, Discussed with Patient

## 2020-08-18 NOTE — Assessment & Plan Note (Addendum)
07/01/2020:Screening mammogram showed a right breast mass. Mammogram and US showed a 0.7cm mass at the 6 o'clock position in the right breast, no axillary adenopathy. 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  T1BN0 stage Ia  08/11/2020:Right lumpectomy [name]): IDC, grade 3, 0.8cm, clear margins, 3 right axillary lymph nodes negative for carcinoma.  ER 30% week, PR 0%, HER-2 positive, Ki-67 40%  Treatment plan: 1. adjuvant Taxol Herceptin  2.  Adjuvant radiation therapy 3.  Follow-up adjuvant antiestrogen therapy  Plan: Port placement, echocardiogram, chemo class Participation in neuropathy clinical trial SWOG S 1714  Return to clinic in 3 weeks to start chemo

## 2020-08-18 NOTE — Progress Notes (Signed)
L8453: Consenting. Studying how cancer treatment affects the nerves in your hands and feet.  Dr. Lindi Adie referred patient to study today. I met with patient this afternoon, here alone, in exam room after her scheduled appointment with Dr. Lindi Adie. Patient confirms that MD gave her a brief description of the study. I reviewed the study in depth and encouraged the patient to take the consent and authorization forms home to review. Patient stated that she would like to participate and wanted to proceed with signing of th consent today. I informed patient that I would need to review the consent form with her in its entirety before she signed it. Patient gave her verbal understanding, at which point I proceeded to review the consent and authorization forms page by page. Patient understands that participation in study is voluntary, understands the purpose of the study and what the study entails, including the assessments and time point of the neuropathy assessments, questionnaires and visits. Patient understands what possible risks and/or benefits to participation, any costs involved, how her information will be kept confidential and who can see it. We also reviewed the optional Sample collection for the study, in which patient did not give her consent to. Once all of patient's questions were answered to her satisfaction, patient proceeded to sign, date and time Protocol Version Date 04/29/20 where indicated and the authorization form. Patient was provided with a copy of the consent and authorization forms and my contact information, for her records. Patient was also informed of the eligibility screening for the study and the baseline assessments required prior to starting her chemotherapy treatments. I thanked patient for her time today and interest in study and encouraged her to call with questions.  Approximately 30 minutes were spent consenting patient. Patient will be called, once her appointments have been  scheduled, to review what the baseline assessments are.  Maxwell Marion, RN, BSN, [facility] Clinical Research 08/18/2020 3:20 PM

## 2020-08-18 NOTE — Progress Notes (Signed)
DCP-001, Use of a Clinical Trial Screening Tool to Address Cancer Health Disparities in the [facility]).   Patient was referred to [phone] and is eligible for the DCP-001 study.  I met with patient this afternoon, here alone, in exam room after her scheduled appointment with Dr. Lindi Adie. I provided patient with a description of what the purpose of this stud is and what it entails. Patient was provided with the consent and authorization forms and I explained to her that she is eligible to participate in this study, regardless of her decision to participate in S1714. Patient was informed that this study involves a one time consent, and a collection of demographic variables, with the majority of the data being collected from her medical record. Patient was made aware that no patient identifiers are being reported via the screening tool. Patient was provided with my contact information and encouraged to call with questions. Patient thanked for her time and interest.  Maxwell Marion, RN, BSN, [place] 08/18/2020 4:24 PM   Met with patient, while here in clinic for her to inquire her interest in study participation. Patient declined participation. I thanked patient for her time and for reviewing the study information with me.  Maxwell Marion, RN, BSN, [facility] Clinical Research 09/08/2020 11:39 AM

## 2020-08-19 ENCOUNTER — Encounter: Payer: Self-pay | Admitting: *Deleted

## 2020-08-22 ENCOUNTER — Telehealth: Payer: Self-pay | Admitting: *Deleted

## 2020-08-22 ENCOUNTER — Ambulatory Visit (HOSPITAL_COMMUNITY)
Admission: RE | Admit: 2020-08-22 | Discharge: 2020-08-22 | Disposition: A | Discharge status: Home / Self Care | Payer: HMO | Source: Ambulatory Visit | Attending: Cardiology | Admitting: Cardiology

## 2020-08-22 ENCOUNTER — Ambulatory Visit (HOSPITAL_BASED_OUTPATIENT_CLINIC_OR_DEPARTMENT_OTHER)
Admission: RE | Admit: 2020-08-22 | Discharge: 2020-08-22 | Disposition: A | Discharge status: Home / Self Care | Payer: HMO | Source: Ambulatory Visit | Attending: Cardiology | Admitting: Cardiology

## 2020-08-22 ENCOUNTER — Other Ambulatory Visit: Payer: Self-pay

## 2020-08-22 ENCOUNTER — Encounter (HOSPITAL_COMMUNITY): Payer: Self-pay | Admitting: Cardiology

## 2020-08-22 ENCOUNTER — Encounter: Payer: Self-pay | Admitting: *Deleted

## 2020-08-22 VITALS — BP 114/72 | HR 86 | Ht 65.0 in | Wt 156.6 lb

## 2020-08-22 DIAGNOSIS — Z794 Long term (current) use of insulin: Secondary | ICD-10-CM | POA: Insufficient documentation

## 2020-08-22 DIAGNOSIS — Z95810 Presence of automatic (implantable) cardiac defibrillator: Secondary | ICD-10-CM | POA: Insufficient documentation

## 2020-08-22 DIAGNOSIS — E119 Type 2 diabetes mellitus without complications: Secondary | ICD-10-CM | POA: Diagnosis not present

## 2020-08-22 DIAGNOSIS — Z803 Family history of malignant neoplasm of breast: Secondary | ICD-10-CM | POA: Insufficient documentation

## 2020-08-22 DIAGNOSIS — E785 Hyperlipidemia, unspecified: Secondary | ICD-10-CM | POA: Diagnosis not present

## 2020-08-22 DIAGNOSIS — Z79899 Other long term (current) drug therapy: Secondary | ICD-10-CM | POA: Insufficient documentation

## 2020-08-22 DIAGNOSIS — C50911 Malignant neoplasm of unspecified site of right female breast: Secondary | ICD-10-CM | POA: Diagnosis not present

## 2020-08-22 DIAGNOSIS — Z7901 Long term (current) use of anticoagulants: Secondary | ICD-10-CM | POA: Insufficient documentation

## 2020-08-22 DIAGNOSIS — I428 Other cardiomyopathies: Secondary | ICD-10-CM

## 2020-08-22 DIAGNOSIS — Z7984 Long term (current) use of oral hypoglycemic drugs: Secondary | ICD-10-CM | POA: Diagnosis not present

## 2020-08-22 DIAGNOSIS — I110 Hypertensive heart disease with heart failure: Secondary | ICD-10-CM | POA: Insufficient documentation

## 2020-08-22 DIAGNOSIS — Z7982 Long term (current) use of aspirin: Secondary | ICD-10-CM | POA: Insufficient documentation

## 2020-08-22 DIAGNOSIS — I5022 Chronic systolic (congestive) heart failure: Secondary | ICD-10-CM | POA: Diagnosis not present

## 2020-08-22 LAB — BASIC METABOLIC PANEL
Anion gap: 9 (ref 5–15)
BUN: 12 mg/dL (ref 8–23)
CO2: 28 mmol/L (ref 22–32)
Calcium: 9.9 mg/dL (ref 8.9–10.3)
Chloride: 103 mmol/L (ref 98–111)
Creatinine, Ser: 0.69 mg/dL (ref 0.44–1.00)
GFR calc Af Amer: >60 mL/min (ref >60)
GFR calc non Af Amer: >60 mL/min (ref >60)
Glucose, Bld: 129 mg/dL — ABNORMAL HIGH (ref 70–99)
Potassium: 4 mmol/L (ref 3.5–5.1)
Sodium: 140 mmol/L (ref 135–145)

## 2020-08-22 LAB — ECHOCARDIOGRAM COMPLETE
Area-P 1/2: 3.6 cm2
Calc EF: 41.2 %
S' Lateral: 4.1 cm
Single Plane A2C EF: 42.7 %
Single Plane A4C EF: 40.3 %

## 2020-08-22 LAB — DIGOXIN LEVEL: Digoxin Level: 0.3 ng/mL — ABNORMAL LOW (ref 1.0–2.0)

## 2020-08-22 MED ORDER — LOSARTAN POTASSIUM 25 MG PO TABS: ORAL_TABLET | ORAL | 3 refills | Status: DC

## 2020-08-22 NOTE — Patient Instructions (Signed)
Increase Losartan to 50 mg (2 tabs) in AM and 25 mg (1 tab) in PM  Labs done today, your results will be available in MyChart, we will contact you for abnormal readings.  Your physician recommends that you return for lab work in: 10 days  Your physician recommends that you schedule a follow-up appointment in: 6 weeks with an echocardiogram  If you have any questions or concerns before your next appointment please send [place] a message through [place] or call our office at [phone].    TO LEAVE A MESSAGE FOR THE NURSE SELECT OPTION 2, PLEASE LEAVE A MESSAGE INCLUDING: . YOUR NAME . DATE OF BIRTH . CALL BACK NUMBER . REASON FOR CALL**this is important as we prioritize the call backs  [place] AS LONG AS YOU CALL BEFORE 4:00 PM  At the [facility], you and your health needs are our priority. As part of our continuing mission to provide you with exceptional heart care, we have created designated Provider Care Teams. These Care Teams include your primary Cardiologist (physician) and Advanced Practice Providers (APPs- Physician Assistants and Nurse Practitioners) who all work together to provide you with the care you need, when you need it.   You may see any of the following providers on your designated Care Team at your next follow up: Marland Kitchen Dr Glori Bickers . Dr Loralie Champagne . Darrick Grinder, NP . Lyda Jester, PA . Audry Riles, PharmD   Please be sure to bring in all your medications bottles to every appointment.

## 2020-08-22 NOTE — Progress Notes (Signed)
  Echocardiogram 2D Echocardiogram has been performed.  Toni [name] 08/22/2020, 4:16 PM

## 2020-08-22 NOTE — Telephone Encounter (Signed)
Received call from pt stating after doing some home research she does not want to use Dignicap cold cap therapy during chemotherapy treatment.  High priority message sent to scheduling to adjust length of time of upcoming apts.

## 2020-08-23 ENCOUNTER — Telehealth: Payer: Self-pay | Admitting: Hematology and Oncology

## 2020-08-23 NOTE — Telephone Encounter (Signed)
Called and spoke with pt, confirmed added appts

## 2020-08-23 NOTE — Progress Notes (Signed)
PCP: Dr. Nancy Fetter Cardiology: Dr. Tamala Julian HF Cardiology: Dr. Aundra Dubin Oncology: Dr. Lindi Adie  Toni [name] is a 64 y.o. [employer] employee with a history of chronic systolic heart failure, due to nonischemic cardiomyopathy, HTN, DM, and hyperlipidemia.   Initially diagnosed with NICM in 1998 thought to be from HTN versus viral. Had cath in 1998 that was negative for coronary disease. EF at that time was 20% but EF recovered in 2012.   In October 2019, she had a cough/virus and she took OTC meds. Says she would feel better for a little while but then felt bad again. 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  She presented to [facility] ED on 11/13/18 with increased shortness of breath. She was admitted and echo was completed showing EF had gone back down to 15%. She had RHC/LHC with nonobstructive CAD and relatively preserved cardiac output. She was diuresed in the hospital and discharged. CPX in 2/20 showed only mild HF limitation.   Given family history of cardiomyopathy, I sent genetic testing.  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  I had her see Dr. Broadus John, we think that the variant may be benign and unrelated to her cardiomyopathy.  She has no history of conduction disturbance.   Echo in 6/20 showed that EF remains low at 25-30%, mild LV dilation, mildly decreased RV systolic function.  She had a Baldwin placed.   8/21 diagnosed with right breast cancer, ER+/PR-/HER2+.  She had right lumpectomy.   Echo was done today and reviewed, EF 40% with mildly decreased RV systolic function.   She returns for followup of CHF. Symptomatically, she has been doing well.  No significant exertional dyspnea.  No orthopnea/PND.  No chest pain.  No lightheadedness or syncope.  No palpitations.   [employer] device interrogation: Heartlogic score 0, no VT    Labs  (1/20): K 4, creatinine 0.67 => 0.74 Labs (3/20): K 4.1, creatinine 0.87 Labs (6/20): digoxin level 0.9 Labs (7/20): K 4.1, creatinine 0.74 Labs (8/20): digoxin 0.4 Labs (11/20): K 4.5, creatinine 0.65, digoxin 1.2 Labs (2/21): digoxin 0.4, K 4.2, creatinine 0.9 Labs (6/21): K 5.1, creatinine 0.83 Labs (9/21): K 4.6, creatinine 0.89  PMH: 1. HTN 2. Type 2 diabetes 3. Hyperlipidemia 4. Chronic systolic CHF: Nonischemic cardiomyopathy.  Diagnosed in 1998, EF 20% by echo at that time.  Echo back to normal range by 2012.   - LHC/RHC (12/19): D1 60-70% stenosis; mean RA 6, PA 58/22, mean PCWP 22, CI 2.9.  - Echo (12/19): EF 15% with severe LV dilation, moderate central MR likely functional.  - Cardiac MRI (12/19): Moderate LV dilation with EF 14%, mild RV dilation with EF 17%, LGE at the inferior RV insertion site (nonspecific).  - CPX (2/20): peak VO2 18.6, VE/VCO2 31, RER 1.18 => mild HF limitation.  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  Suspect the variant is benign.  - Echo (6/20): EF 25-30%, mild LV dilation, mildly decreased RV systolic function.  - Echo (10/21): EF 40%, diffuse hypokinesis, mildly decreased RV systolic function.  5. Angioedema with ACEI 6. Left shoulder adhesive capsulitis 7. Depression 8. Breast cancer: 8/21 diagnosed with right breast cancer, ER+/PR-/HER2+.  She had right lumpectomy.   Social History   Socioeconomic History  . Marital status: Single    Spouse name:  Not on file  . Number of children: Not on file  . Years of education: 45  . Highest education level: Bachelor's degree (e.g., BA, AB, BS)  Occupational History  . Occupation: Surveyor, quantity: [employer]  Tobacco Use  . Smoking status: Never Smoker  . Smokeless tobacco: Never Used  Vaping Use  . Vaping Use: Never used  Substance and Sexual Activity  . Alcohol use: Yes    Alcohol/week: 0.0 standard drinks     Comment: less than once a month  . Drug use: No  . Sexual activity: Never  Other Topics Concern  . Not on file  Social History Narrative   Works at [employer].  Lives alone.     Social Determinants of Health   Financial Resource Strain: Low Risk   . Difficulty of Paying Living Expenses: Not hard at all  Food Insecurity: No Food Insecurity  . Worried About Charity fundraiser in the Last Year: Never true  . Ran Out of Food in the Last Year: Never true  Transportation Needs: No Transportation Needs  . Lack of Transportation (Medical): No  . Lack of Transportation (Non-Medical): No  Physical Activity: Inactive  . Days of Exercise per Week: 0 days  . Minutes of Exercise per Session: 0 min  Stress: Stress Concern Present  . Feeling of Stress : Very much  Social Connections:   . Frequency of Communication with Friends and Family: Not on file  . Frequency of Social Gatherings with Friends and Family: Not on file  . Attends Religious Services: Not on file  . Active Member of Clubs or Organizations: Not on file  . Attends Archivist Meetings: Not on file  . Marital Status: Not on file  Intimate Partner Violence:   . Fear of Current or Ex-Partner: Not on file  . Emotionally Abused: Not on file  . Physically Abused: Not on file  . Sexually Abused: Not on file   Family History  Problem Relation Age of Onset  . Diabetes Mellitus I Mother   . Lung cancer Mother 49  . Breast cancer Mother        dx. late 30s/early 27s  . Diabetes Mellitus I Father   . Sudden death Father 33  . Prostate cancer Brother 27  . Hypertension Brother   . Hypertension Brother   . Diabetes Mellitus I Brother   . Benign prostatic hyperplasia Brother   . Heart failure Paternal Uncle   . Thyroid cancer Niece        dx. in her 37s  . Breast cancer Cousin        dx. in her 39s, recurrence in her 60s (maternal first cousin)  . Breast cancer Cousin        female dx. in his early 78s (paternal first cousin)   . Cancer Cousin        dx. in his early 67s, unknown type (paternal first cousin)  . Cancer Cousin 44       unknown type (paternal first cousin)   ROS: All systems reviewed and negative except as per HPI.   Current Outpatient Medications  Medication Sig Dispense Refill  . aspirin 81 MG chewable tablet Chew 1 tablet (81 mg total) by mouth daily. 30 tablet 0  . carvedilol (COREG) 25 MG tablet TAKE 1 TABLET (25 MG TOTAL) BY MOUTH 2 (TWO) TIMES DAILY WITH A MEAL. 180 tablet 3  . cetirizine (ZYRTEC) 10 MG tablet Take  10 mg by mouth daily.     . digoxin (LANOXIN) 0.125 MG tablet Take 0.5 tablets (0.0625 mg total) by mouth daily. 45 tablet 1  . furosemide (LASIX) 20 MG tablet Take 20 mg by mouth daily as needed for fluid or edema.     Marland Kitchen glipiZIDE (GLUCOTROL XL) 10 MG 24 hr tablet Take 20 mg by mouth daily with breakfast.     . hydrALAZINE (APRESOLINE) 50 MG tablet TAKE 1 TABLET (50 MG TOTAL) BY MOUTH 3 (THREE) TIMES DAILY. 270 tablet 1  . Insulin Glargine (BASAGLAR KWIKPEN) 100 UNIT/ML Inject 20 Units into the skin at bedtime.     . isosorbide dinitrate (ISORDIL) 20 MG tablet TAKE 1 TABLET (20 MG TOTAL) BY MOUTH 3 TIMES DAILY. (Patient taking differently: Take 20 mg by mouth 3 (three) times daily. ) 90 tablet 11  . JARDIANCE 25 MG TABS tablet Take 25 mg by mouth daily.    Marland Kitchen lidocaine-prilocaine (EMLA) cream Apply to affected area once (Patient not taking: Reported on 08/23/2020) 30 g 3  . losartan (COZAAR) 25 MG tablet Take 2 tablets (50 mg total) by mouth in the morning AND 1 tablet (25 mg total) every evening. 90 tablet 3  . metFORMIN (GLUCOPHAGE) 1000 MG tablet Take 1,000 mg by mouth 2 (two) times daily with a meal.    . Multiple Vitamin (MULTIVITAMIN WITH MINERALS) TABS tablet Take 1 tablet by mouth daily. Centrum Silver    . OZEMPIC, 1 MG/DOSE, 4 MG/3ML SOPN Inject 1 mg into the skin every Saturday.     . simvastatin (ZOCOR) 20 MG tablet Take 20 mg by mouth at bedtime.     Marland Kitchen spironolactone  (ALDACTONE) 25 MG tablet TAKE 1 TABLET BY MOUTH EVERY EVENING. (Patient taking differently: Take 25 mg by mouth at bedtime. ) 90 tablet 3  . LINZESS 145 MCG CAPS capsule Take 145 mcg by mouth daily as needed (constipation). Constipation    . ondansetron (ZOFRAN) 8 MG tablet Take 1 tablet (8 mg total) by mouth 2 (two) times daily as needed (Nausea or vomiting). (Patient not taking: Reported on 08/22/2020) 30 tablet 1  . prochlorperazine (COMPAZINE) 10 MG tablet Take 1 tablet (10 mg total) by mouth every 6 (six) hours as needed (Nausea or vomiting). (Patient not taking: Reported on 08/22/2020) 30 tablet 1  . traMADol (ULTRAM) 50 MG tablet Take 1-2 tablets (50-100 mg total) by mouth every 6 (six) hours as needed for moderate pain. (Patient not taking: Reported on 08/22/2020) 20 tablet 0  . TRUE METRIX BLOOD GLUCOSE TEST test strip 1 each by Other route as directed.  (Patient not taking: Reported on 08/22/2020)  5  . TRUEPLUS LANCETS 30G MISC 1 each by Other route as directed. Use as directed. (Patient not taking: Reported on 08/22/2020)  5  . UNIFINE PENTIPS 32G X 4 MM MISC 1 each by Other route as directed.  (Patient not taking: Reported on 08/22/2020)     No current facility-administered medications for this encounter.   BP 114/72 (BP Location: Left Arm, Patient Position: Sitting, Cuff Size: Normal)   Pulse 86   Ht _0  (1.651 m)   Wt 71 kg (156 lb 9.6 oz)   SpO2 98%   BMI 26.06 kg/m  General: NAD Neck: No JVD, no thyromegaly or thyroid nodule.  Lungs: Clear to auscultation bilaterally with normal respiratory effort. CV: Nondisplaced PMI.  Heart regular S1/S2, no S3/S4, no murmur.  No peripheral edema.  No carotid bruit.  Normal  pedal pulses.  Abdomen: Soft, nontender, no hepatosplenomegaly, no distention.  Skin: Intact without lesions or rashes.  Neurologic: Alert and oriented x 3.  Psych: Normal affect. Extremities: No clubbing or cyanosis.  HEENT: Normal.   Assessment/Plan: 1. Chronic  systolic CHF: Nonischemic cardiomyopathy by 12/19 cath.  Cardiac MRI with LV EF 14%, RV EF 17%. No definite evidence for myocarditis or infiltrative disease by delayed enhancement images.  Most likely cause of cardiomyopathy is familial versus prior viral myocarditis.  Brother also had a cardiomyopathy of uncertain etiology.  Genetic testing was done, showing an LMNA gene variant of uncertain significance => she saw Dr. Broadus John, suspect benign/uninvolved variant (no conduction abnormality).  CPX in 2/20 showed only mild HF limitation.  Echo in 6/20 showed EF 25-30%. Echo done today showed EF up some to 40%.  She now has a Kappa.  On exam and by Heartlogic, she is not volume overloaded.  NYHA class II symptoms.  Narrow QRS, not CRT candidate.  - Continue Coreg 25 mg bid.   - Increase losartan to 50 qam/25 qpm with BMET in 10 days.  She cannot take Entresto with history of ACEI angioedema. BMET today.  - Continue dapagliflozin.  - She will continue to take Lasix only prn.   - Continue spironolactone 25 mg daily.  - Continue digoxin, check level.  - Continue hydralazine 50 mg tid and isordil 20 mg tid.  2. Type II diabetes: She is on dapagliflozin.  3. Breast cancer: HER2+.  Ideally, would have treatment with Taxol/Herceptin followed by Herceptin alone to complete a year.  Her EF is 40% on echo today.  While this is improved from prior, it still places her at higher risk for cardiotoxicity from Herceptin.  There is some data that patients with EF 40-50% can safely get Herceptin if they are on beta blocker/RAAS inhibition and followed closely.  However, she is on the low end of this range. If Dr. Lindi Adie feels strongly about Herceptin treatment for her, we can initiate but follow very closely (echo at 1 month after starting).   If Herceptin is used, she will need followup with echo about 1 month after initiation.    Loralie Champagne 08/23/2020

## 2020-08-26 ENCOUNTER — Other Ambulatory Visit: Payer: Self-pay | Admitting: Surgery

## 2020-08-27 ENCOUNTER — Other Ambulatory Visit (HOSPITAL_COMMUNITY)
Admission: RE | Admit: 2020-08-27 | Discharge: 2020-08-27 | Disposition: A | Discharge status: Home / Self Care | Payer: No Typology Code available for payment source | Source: Ambulatory Visit | Attending: Surgery | Admitting: Surgery

## 2020-08-27 DIAGNOSIS — Z01812 Encounter for preprocedural laboratory examination: Secondary | ICD-10-CM | POA: Diagnosis not present

## 2020-08-27 DIAGNOSIS — Z20822 Contact with and (suspected) exposure to covid-19: Secondary | ICD-10-CM | POA: Diagnosis not present

## 2020-08-27 LAB — SARS CORONAVIRUS 2 (TAT 6-24 HRS): SARS Coronavirus 2: NEGATIVE

## 2020-08-29 ENCOUNTER — Encounter: Payer: Self-pay | Admitting: Internal Medicine

## 2020-08-29 NOTE — Progress Notes (Unsigned)
[place] DEVICE PROGRAMMING  Patient Information: Name:  [name]  DOB:  [birth year]  MRN:  [id number]  {TIP - You do not have to delete this tip  -  Copy the info from the staff message sent by the PAT staff  then press F2 here and paste the information using CTL - V on the next line :[id number]}  Patient: Toni [name]  DOB:  06/20/1956  Planned Procedure: Port-a-Cath Insertion  Surgeon: Dr. Coralie Keens  Date of Procedure: 08/31/20  Time of Procedure: 4707  Cautery will be used.  Position during surgery: Supine   Please send documentation back to:  [facility] (Fax # [phone])   Device Information:  Clinic EP Physician:  Cristopher Peru, MD   Device Type:  Defibrillator Manufacturer and Phone #:  [employer]: [phone] Pacemaker Dependent?:  No. Date of Last Device Check:  08/22/20 Normal Device Function?:  Yes.    Electrophysiologist's Recommendations:   Have magnet available.  Provide continuous ECG monitoring when magnet is used or reprogramming is to be performed.   Procedure may interfere with device function.  Magnet should be placed over device during procedure.  Per Device Clinic Standing Orders, Simone Curia, RN  4:38 PM 08/29/2020

## 2020-08-29 NOTE — Progress Notes (Signed)
Device Clinic notified of surgery.  Also, spoke with Joey at [facility] and he is aware of surgery, date and time.

## 2020-08-30 NOTE — H&P (Signed)
   Lura Em Appointment: 08/26/2020 9:00 AM Location: [facility] Patient #: [id number] DOB: [birth year] Single / Language: Toni [name] / Race: Black or African American Female   History of Present Illness (Sahej Schrieber A. [name] MD; 08/26/2020 9:05 AM) The patient is a 64 year old female presenting for a post-operative visit.  Chief complaint: Right breast cancer  She is here today for a postoperative visit status post radioactive seed right breast lumpectomy and sentinel lobe biopsy. Because of her HER-2 status and the size of the cancer, the decision has been made to proceed with chemotherapy. She is here today also to discuss Port-A-Cath insertion. She's had no issues regarding her surgery. Again, she has a defibrillator on the left side of her chest.   Allergies (Chanel Teressa Senter, CMA; 08/26/2020 8:59 AM) Biaxin *MACROLIDES*  Nausea. Enalapril Maleate *ANTIHYPERTENSIVES*  Angioedema. Lip swelling Vasotec *ANTIHYPERTENSIVES*  Angioedema. Lip swelling Allergies Reconciled   Medication History (Chanel Teressa Senter, CMA; 08/26/2020 8:59 AM) Aspirin (81MG Tablet DR, Oral) Active. Coreg (25MG Tablet, Oral) Active. ZyrTEC Allergy (10MG Tablet, Oral) Active. Farxiga (10MG Tablet, Oral) Active. Digoxin (0.05MG Capsule, Oral) Active. Lasix (20MG Tablet, Oral) Active. glipiZIDE ER (10MG Tablet ER 24HR, Oral) Active. Apresoline (50MG Tablet, Oral) Active. Isordil Titradose (20MG Tablet, Oral) Active. Linzess (145MCG Capsule, Oral) Active. Victoza (18MG/3ML Solution, Subcutaneous) Active. Cozaar (25MG Tablet, Oral) Active. metFORMIN HCl (1000MG Tablet, Oral) Active. Zoloft (50MG Tablet, Oral) Active. Zocor (20MG Tablet, Oral) Active. Aldactone (25MG Tablet, Oral) Active. Multiple Vitamin (1 (one) Oral) Active. Medications Reconciled  Vitals (Chanel Nolan CMA; 08/26/2020 8:59 AM) 08/26/2020 8:59 AM Weight: 154 lb Height: 65in Body Surface Area: 1.77 m Body  Mass Index: 25.63 kg/m  Temp.: 97.35F  Pulse: 87 (Regular)  BP: 120/74(Sitting, Left Arm, Standard)       Physical Exam (Elva Mauro A. [name] MD; 08/26/2020 9:06 AM) The physical exam findings are as follows: Note: Today on exam she appears well  On exam, the right breast incision is healing well without evidence of infection.  She has a palpable defibrillator on her left chest    Assessment & Plan (Danyal Adorno A. [name] MD; 08/26/2020 9:06 AM) POSTOP CHECK (L89) Impression: She is doing well postoperatively. We will now proceed with Port-A-Cath insertion next week. I discussed procedure with her in detail. We discussed the risk which includes but is not limited to bleeding, infection, injury to surrounding structures, pneumothorax, need for other procedures, cardiopulmonary issues, etc. Chemotherapy will start the following week. Again, we have placed a portable right side given her defibrillator on the left. Surgery will be scheduled.

## 2020-08-31 ENCOUNTER — Other Ambulatory Visit: Payer: Self-pay

## 2020-08-31 ENCOUNTER — Encounter (HOSPITAL_COMMUNITY)
Admission: RE | Disposition: A | Discharge status: Home / Self Care | Payer: Self-pay | Source: Home / Self Care | Attending: Surgery

## 2020-08-31 ENCOUNTER — Ambulatory Visit (HOSPITAL_COMMUNITY)
Admission: RE | Admit: 2020-08-31 | Discharge: 2020-08-31 | Disposition: A | Discharge status: Home / Self Care | Payer: No Typology Code available for payment source | Attending: Surgery | Admitting: Surgery

## 2020-08-31 ENCOUNTER — Ambulatory Visit (HOSPITAL_COMMUNITY): Payer: No Typology Code available for payment source

## 2020-08-31 ENCOUNTER — Ambulatory Visit (HOSPITAL_COMMUNITY): Payer: No Typology Code available for payment source | Admitting: Anesthesiology

## 2020-08-31 ENCOUNTER — Encounter (HOSPITAL_COMMUNITY): Payer: Self-pay | Admitting: Surgery

## 2020-08-31 DIAGNOSIS — I2510 Atherosclerotic heart disease of native coronary artery without angina pectoris: Secondary | ICD-10-CM | POA: Insufficient documentation

## 2020-08-31 DIAGNOSIS — Z7984 Long term (current) use of oral hypoglycemic drugs: Secondary | ICD-10-CM | POA: Insufficient documentation

## 2020-08-31 DIAGNOSIS — E119 Type 2 diabetes mellitus without complications: Secondary | ICD-10-CM | POA: Insufficient documentation

## 2020-08-31 DIAGNOSIS — Z452 Encounter for adjustment and management of vascular access device: Secondary | ICD-10-CM | POA: Diagnosis not present

## 2020-08-31 DIAGNOSIS — I110 Hypertensive heart disease with heart failure: Secondary | ICD-10-CM | POA: Diagnosis not present

## 2020-08-31 DIAGNOSIS — E785 Hyperlipidemia, unspecified: Secondary | ICD-10-CM | POA: Insufficient documentation

## 2020-08-31 DIAGNOSIS — I509 Heart failure, unspecified: Secondary | ICD-10-CM | POA: Diagnosis not present

## 2020-08-31 DIAGNOSIS — Z79899 Other long term (current) drug therapy: Secondary | ICD-10-CM | POA: Insufficient documentation

## 2020-08-31 DIAGNOSIS — Z7982 Long term (current) use of aspirin: Secondary | ICD-10-CM | POA: Diagnosis not present

## 2020-08-31 DIAGNOSIS — Z95810 Presence of automatic (implantable) cardiac defibrillator: Secondary | ICD-10-CM | POA: Diagnosis not present

## 2020-08-31 DIAGNOSIS — C50911 Malignant neoplasm of unspecified site of right female breast: Secondary | ICD-10-CM | POA: Insufficient documentation

## 2020-08-31 DIAGNOSIS — Z419 Encounter for procedure for purposes other than remedying health state, unspecified: Secondary | ICD-10-CM

## 2020-08-31 HISTORY — PX: PORTACATH PLACEMENT: SHX2246

## 2020-08-31 LAB — GLUCOSE, CAPILLARY
Glucose-Capillary: 77 mg/dL (ref 70–99)
Glucose-Capillary: 49 mg/dL — ABNORMAL LOW (ref 70–99)
Glucose-Capillary: 75 mg/dL (ref 70–99)
Glucose-Capillary: 89 mg/dL (ref 70–99)
Glucose-Capillary: 144 mg/dL — ABNORMAL HIGH (ref 70–99)

## 2020-08-31 LAB — CBC
HCT: 42 % (ref 36.0–46.0)
Hemoglobin: 13 g/dL (ref 12.0–15.0)
MCH: 28.4 pg (ref 26.0–34.0)
MCHC: 31 g/dL (ref 30.0–36.0)
MCV: 91.7 fL (ref 80.0–100.0)
Platelets: 271 10*3/uL (ref 150–400)
RBC: 4.58 MIL/uL (ref 3.87–5.11)
RDW: 12.6 % (ref 11.5–15.5)
WBC: 5.3 10*3/uL (ref 4.0–10.5)
nRBC: 0 % (ref 0.0–0.2)

## 2020-08-31 SURGERY — INSERTION, TUNNELED CENTRAL VENOUS DEVICE, WITH PORT
Anesthesia: General | Site: Chest

## 2020-08-31 MED ORDER — KETOROLAC TROMETHAMINE 15 MG/ML IJ SOLN: 15.0000 mg | Freq: Once | INTRAMUSCULAR | Status: DC | PRN

## 2020-08-31 MED ORDER — ENSURE PRE-SURGERY PO LIQD: 296.0000 mL | Freq: Once | ORAL | Status: DC

## 2020-08-31 MED ORDER — DEXTROSE 50 % IV SOLN: 25.0000 mL | Freq: Once | INTRAVENOUS | Status: AC

## 2020-08-31 MED ORDER — BUPIVACAINE HCL (PF) 0.25 % IJ SOLN
INTRAMUSCULAR | Status: DC | PRN
  Administered 2020-08-31: 10 mL

## 2020-08-31 MED ORDER — CHLORHEXIDINE GLUCONATE 0.12 % MT SOLN
15.0000 mL | Freq: Once | OROMUCOSAL | Status: AC
  Administered 2020-08-31: 15 mL via OROMUCOSAL
  Filled 2020-08-31: qty 15

## 2020-08-31 MED ORDER — DEXTROSE 50 % IV SOLN
25.0000 mL | Freq: Once | INTRAVENOUS | Status: AC
  Administered 2020-08-31: 25 mL via INTRAVENOUS

## 2020-08-31 MED ORDER — 0.9 % SODIUM CHLORIDE (POUR BTL) OPTIME
TOPICAL | Status: DC | PRN
  Administered 2020-08-31: 1000 mL

## 2020-08-31 MED ORDER — HEPARIN SOD (PORK) LOCK FLUSH 100 UNIT/ML IV SOLN
INTRAVENOUS | Status: AC
  Filled 2020-08-31: qty 5

## 2020-08-31 MED ORDER — PROPOFOL 10 MG/ML IV BOLUS
INTRAVENOUS | Status: DC | PRN
  Administered 2020-08-31: 200 mg via INTRAVENOUS

## 2020-08-31 MED ORDER — PROPOFOL 10 MG/ML IV BOLUS
INTRAVENOUS | Status: AC
  Filled 2020-08-31: qty 20

## 2020-08-31 MED ORDER — DEXAMETHASONE SODIUM PHOSPHATE 10 MG/ML IJ SOLN
INTRAMUSCULAR | Status: DC | PRN
  Administered 2020-08-31: 5 mg via INTRAVENOUS

## 2020-08-31 MED ORDER — MIDAZOLAM HCL 2 MG/2ML IJ SOLN
INTRAMUSCULAR | Status: DC | PRN
  Administered 2020-08-31: 2 mg via INTRAVENOUS

## 2020-08-31 MED ORDER — ACETAMINOPHEN 500 MG PO TABS
1000.0000 mg | ORAL_TABLET | ORAL | Status: AC
  Administered 2020-08-31: 1000 mg via ORAL
  Filled 2020-08-31: qty 2

## 2020-08-31 MED ORDER — PHENYLEPHRINE 40 MCG/ML (10ML) SYRINGE FOR IV PUSH (FOR BLOOD PRESSURE SUPPORT)
PREFILLED_SYRINGE | INTRAVENOUS | Status: DC | PRN
  Administered 2020-08-31 (×3): 80 ug via INTRAVENOUS
  Administered 2020-08-31: 120 ug via INTRAVENOUS
  Administered 2020-08-31: 40 ug via INTRAVENOUS

## 2020-08-31 MED ORDER — GABAPENTIN 300 MG PO CAPS
300.0000 mg | ORAL_CAPSULE | ORAL | Status: AC
  Administered 2020-08-31: 300 mg via ORAL
  Filled 2020-08-31: qty 1

## 2020-08-31 MED ORDER — CHLORHEXIDINE GLUCONATE CLOTH 2 % EX PADS: 6.0000 | MEDICATED_PAD | Freq: Once | CUTANEOUS | Status: DC

## 2020-08-31 MED ORDER — SODIUM CHLORIDE 0.9 % IV SOLN
INTRAVENOUS | Status: AC
  Filled 2020-08-31: qty 1.2

## 2020-08-31 MED ORDER — ONDANSETRON HCL 4 MG/2ML IJ SOLN
INTRAMUSCULAR | Status: DC | PRN
  Administered 2020-08-31: 4 mg via INTRAVENOUS

## 2020-08-31 MED ORDER — EPHEDRINE SULFATE-NACL 50-0.9 MG/10ML-% IV SOSY
PREFILLED_SYRINGE | INTRAVENOUS | Status: DC | PRN
  Administered 2020-08-31 (×2): 10 mg via INTRAVENOUS

## 2020-08-31 MED ORDER — HEPARIN SOD (PORK) LOCK FLUSH 100 UNIT/ML IV SOLN
INTRAVENOUS | Status: DC | PRN
  Administered 2020-08-31: 500 [IU]

## 2020-08-31 MED ORDER — FENTANYL CITRATE (PF) 100 MCG/2ML IJ SOLN: 25.0000 ug | INTRAMUSCULAR | Status: DC | PRN

## 2020-08-31 MED ORDER — LIDOCAINE 2% (20 MG/ML) 5 ML SYRINGE
INTRAMUSCULAR | Status: DC | PRN
  Administered 2020-08-31: 70 mg via INTRAVENOUS

## 2020-08-31 MED ORDER — FENTANYL CITRATE (PF) 250 MCG/5ML IJ SOLN
INTRAMUSCULAR | Status: DC | PRN
Start: 2020-08-31 — End: 2020-08-31
  Administered 2020-08-31: 25 ug via INTRAVENOUS

## 2020-08-31 MED ORDER — MIDAZOLAM HCL 2 MG/2ML IJ SOLN
INTRAMUSCULAR | Status: AC
  Filled 2020-08-31: qty 2

## 2020-08-31 MED ORDER — ONDANSETRON HCL 4 MG/2ML IJ SOLN: 4.0000 mg | Freq: Once | INTRAMUSCULAR | Status: DC | PRN

## 2020-08-31 MED ORDER — SODIUM CHLORIDE 0.9 % IV SOLN: INTRAVENOUS | Status: DC | PRN

## 2020-08-31 MED ORDER — ORAL CARE MOUTH RINSE: 15.0000 mL | Freq: Once | OROMUCOSAL | Status: AC

## 2020-08-31 MED ORDER — FENTANYL CITRATE (PF) 250 MCG/5ML IJ SOLN
INTRAMUSCULAR | Status: AC
  Filled 2020-08-31: qty 5

## 2020-08-31 MED ORDER — CEFAZOLIN SODIUM-DEXTROSE 2-4 GM/100ML-% IV SOLN
2.0000 g | INTRAVENOUS | Status: AC
  Administered 2020-08-31: 2 g via INTRAVENOUS
  Filled 2020-08-31: qty 100

## 2020-08-31 MED ORDER — LACTATED RINGERS IV SOLN: INTRAVENOUS | Status: DC

## 2020-08-31 MED ORDER — DEXTROSE 50 % IV SOLN
INTRAVENOUS | Status: AC
  Administered 2020-08-31: 25 mL via INTRAVENOUS
  Filled 2020-08-31: qty 50

## 2020-08-31 SURGICAL SUPPLY — 43 items
ADH SKN CLS APL DERMABOND .7 (GAUZE/BANDAGES/DRESSINGS) ×1
APL PRP STRL LF DISP 70% ISPRP (MISCELLANEOUS) ×1
BAG DECANTER FOR FLEXI CONT (MISCELLANEOUS) ×2 IMPLANT
CHLORAPREP W/TINT 26 (MISCELLANEOUS) ×2 IMPLANT
COVER SURGICAL LIGHT HANDLE (MISCELLANEOUS) ×2 IMPLANT
COVER TRANSDUCER ULTRASND GEL (DISPOSABLE) ×1 IMPLANT
DERMABOND ADVANCED (GAUZE/BANDAGES/DRESSINGS) ×1
DERMABOND ADVANCED .7 DNX12 (GAUZE/BANDAGES/DRESSINGS) ×1 IMPLANT
DRAPE C-ARM 42X120 X-RAY (DRAPES) ×2 IMPLANT
DRAPE CHEST BREAST 15X10 FENES (DRAPES) ×2 IMPLANT
DRSG TEGADERM 4X4.75 (GAUZE/BANDAGES/DRESSINGS) ×2 IMPLANT
ELECT CAUTERY BLADE 6.4 (BLADE) ×2 IMPLANT
ELECT REM PT RETURN 9FT ADLT (ELECTROSURGICAL) ×2
ELECTRODE REM PT RTRN 9FT ADLT (ELECTROSURGICAL) ×1 IMPLANT
GAUZE SPONGE 2X2 8PLY STRL LF (GAUZE/BANDAGES/DRESSINGS) ×1 IMPLANT
GEL ULTRASOUND 20GR AQUASONIC (MISCELLANEOUS) IMPLANT
GLOVE SURG SIGNA 7.5 PF LTX (GLOVE) ×2 IMPLANT
GOWN STRL REUS W/ TWL LRG LVL3 (GOWN DISPOSABLE) ×1 IMPLANT
GOWN STRL REUS W/ TWL XL LVL3 (GOWN DISPOSABLE) ×1 IMPLANT
GOWN STRL REUS W/TWL LRG LVL3 (GOWN DISPOSABLE) ×2
GOWN STRL REUS W/TWL XL LVL3 (GOWN DISPOSABLE) ×2
INTRODUCER COOK 11FR (CATHETERS) IMPLANT
KIT BASIN OR (CUSTOM PROCEDURE TRAY) ×2 IMPLANT
KIT PORT POWER 8FR ISP CVUE (Port) ×1 IMPLANT
KIT TURNOVER KIT B (KITS) ×2 IMPLANT
NS IRRIG 1000ML POUR BTL (IV SOLUTION) ×2 IMPLANT
PAD ARMBOARD 7.5X6 YLW CONV (MISCELLANEOUS) ×2 IMPLANT
PENCIL BUTTON HOLSTER BLD 10FT (ELECTRODE) ×2 IMPLANT
POSITIONER HEAD DONUT 9IN (MISCELLANEOUS) ×2 IMPLANT
SET INTRODUCER 12FR PACEMAKER (INTRODUCER) IMPLANT
SET SHEATH INTRODUCER 10FR (MISCELLANEOUS) IMPLANT
SHEATH COOK PEEL AWAY SET 9F (SHEATH) IMPLANT
SPONGE GAUZE 2X2 STER 10/PKG (GAUZE/BANDAGES/DRESSINGS) ×1
SUT MNCRL AB 4-0 PS2 18 (SUTURE) ×2 IMPLANT
SUT PROLENE 2 0 SH 30 (SUTURE) ×2 IMPLANT
SUT SILK 2 0 (SUTURE)
SUT SILK 2-0 18XBRD TIE 12 (SUTURE) IMPLANT
SUT VIC AB 3-0 SH 27 (SUTURE) ×2
SUT VIC AB 3-0 SH 27XBRD (SUTURE) ×1 IMPLANT
SYR 5ML LUER SLIP (SYRINGE) ×2 IMPLANT
TOWEL GREEN STERILE (TOWEL DISPOSABLE) ×2 IMPLANT
TOWEL GREEN STERILE FF (TOWEL DISPOSABLE) ×2 IMPLANT
TRAY LAPAROSCOPIC MC (CUSTOM PROCEDURE TRAY) ×2 IMPLANT

## 2020-08-31 NOTE — Discharge Instructions (Signed)
Ok to shower starting tomorrow  Ice pack, tylenol, and ibuprofen for pain  No vigorous activity for one week

## 2020-08-31 NOTE — Anesthesia Preprocedure Evaluation (Addendum)
Anesthesia Evaluation  Patient identified by MRN, date of birth, ID band Patient awake    Reviewed: Allergy & Precautions, NPO status , Patient's Chart, lab work & pertinent test results  Airway Mallampati: I  TM Distance: >3 FB Neck ROM: Full    Dental  (+) Missing   Pulmonary neg pulmonary ROS,    Pulmonary exam normal breath sounds clear to auscultation       Cardiovascular hypertension, Pt. on home beta blockers and Pt. on medications + CAD and +CHF  Normal cardiovascular exam+ Cardiac Defibrillator  Rhythm:Regular Rate:Normal  ECG: SR, rate 76  ECHO: 1. Left ventricular ejection fraction, by estimation, is 40%. The left ventricle has mild to moderately decreased function. The left ventricle demonstrates global hypokinesis. Left ventricular diastolic parameters are consistent with Grade I diastolic dysfunction (impaired relaxation). 2. Right ventricular systolic function is mildly reduced. The right ventricular size is normal. There is normal pulmonary artery systolic pressure. The estimated right ventricular systolic pressure is 53.0 mmHg. 3. The mitral valve is normal in structure. Trivial mitral valve regurgitation. 4. The aortic valve is tricuspid. Aortic valve regurgitation is not visualized. No aortic stenosis is present. 5. The inferior vena cava is normal in size with greater than 50% respiratory variability, suggesting right atrial pressure of 3 mmHg.   Neuro/Psych negative neurological ROS  negative psych ROS   GI/Hepatic negative GI ROS, Neg liver ROS,   Endo/Other  diabetes, Oral Hypoglycemic Agents, Insulin Dependent  Renal/GU negative Renal ROS     Musculoskeletal negative musculoskeletal ROS (+)   Abdominal   Peds  Hematology HLD   Anesthesia Other Findings BREAST CANCER  Reproductive/Obstetrics                            Anesthesia Physical Anesthesia Plan  ASA:  III  Anesthesia Plan: General   Post-op Pain Management:    Induction: Intravenous  PONV Risk Score and Plan: 3 and Ondansetron, Dexamethasone, Midazolam and Treatment may vary due to age or medical condition  Airway Management Planned: LMA  Additional Equipment:   Intra-op Plan:   Post-operative Plan: Extubation in OR  Informed Consent: I have reviewed the patients History and Physical, chart, labs and discussed the procedure including the risks, benefits and alternatives for the proposed anesthesia with the patient or authorized representative who has indicated his/her understanding and acceptance.     Dental advisory given  Plan Discussed with: CRNA  Anesthesia Plan Comments:        Anesthesia Quick Evaluation

## 2020-08-31 NOTE — Anesthesia Procedure Notes (Signed)
Procedure Name: LMA Insertion Date/Time: 08/31/2020 1:23 PM Performed by: Janace Litten, CRNA Pre-anesthesia Checklist: Patient identified, Emergency Drugs available, Suction available and Patient being monitored Patient Re-evaluated:Patient Re-evaluated prior to induction Oxygen Delivery Method: Circle System Utilized Preoxygenation: Pre-oxygenation with 100% oxygen Induction Type: IV induction Ventilation: Mask ventilation without difficulty LMA: LMA inserted LMA Size: 3.0 Number of attempts: 1 Placement Confirmation: positive ETCO2 Tube secured with: Tape Dental Injury: Teeth and Oropharynx as per pre-operative assessment

## 2020-08-31 NOTE — OR Nursing (Addendum)
Anesthesia Ellender made aware pt sugar 51 and then 49 on double check, verbal order given  For d50 1/2 amp, pt is showing no s/s of hypoglycemia at this time.  Will recheck in 15 min

## 2020-08-31 NOTE — Transfer of Care (Signed)
Immediate Anesthesia Transfer of Care Note  Patient: Dorthea Maina  Procedure(s) Performed: INSERTION PORT-A-CATH WITH ULTRASOUND GUIDANCE (N/A Chest)  Patient Location: PACU  Anesthesia Type:General  Level of Consciousness: drowsy, patient cooperative and responds to stimulation  Airway & Oxygen Therapy: Patient Spontanous Breathing  Post-op Assessment: Report given to RN and Post -op Vital signs reviewed and stable  Post vital signs: Reviewed and stable  Last Vitals:  Vitals Value Taken Time  BP 129/73 08/31/20 1407  Temp    Pulse 71 08/31/20 1409  Resp 12 08/31/20 1409  SpO2 96 % 08/31/20 1409  Vitals shown include unvalidated device data.  Last Pain:  Vitals:   08/31/20 1218  TempSrc:   PainSc: 0-No pain         Complications: No complications documented.

## 2020-08-31 NOTE — OR Nursing (Signed)
Pt blood sugar now 89, pt showing no s/s of diabetic complications at this time.  Pt is alert and oriented, following commands, VS WNL

## 2020-08-31 NOTE — Interval H&P Note (Signed)
History and Physical Interval Note: no change in H and P  08/31/2020 11:53 AM  Toni [name]  has presented today for surgery, with the diagnosis of BREAST CANCER.  The various methods of treatment have been discussed with the patient and family. After consideration of risks, benefits and other options for treatment, the patient has consented to  Procedure(s): INSERTION PORT-A-CATH WITH ULTRASOUND GUIDANCE (N/A) as a surgical intervention.  The patient's history has been reviewed, patient examined, no change in status, stable for surgery.  I have reviewed the patient's chart and labs.  Questions were answered to the patient's satisfaction.     Coralie Keens

## 2020-08-31 NOTE — OR Nursing (Signed)
Pt is awake,alert and oriented. Pt is in NAD at this time. Pt and family verbalized understanding of poc and discharge instructions. instructions given to family and reviewed prior to discharge.Pt is ambulatory to wheelchair with stand by assist but not needed with steady gait.  Pt taken to front lobby and placed in car with family.  Pt blood sugar prior to leaving 144.  Pt family made aware, pt has no complaints

## 2020-08-31 NOTE — Op Note (Addendum)
Port insertion Procedure Note  Indications: This patient presents with breast cancer and a need for chemotherapy.  Pre-operative Diagnosis: Right breast cancer  Post-operative Diagnosis: Same  Surgeon: Coralie Keens, MD  Assistants: Ahmed Prima, MD [facility]  Anesthesia: General LMA anesthesia  ASA Class: 2  Procedure Details  The patient was seen again in the Holding Room. The risks, benefits, complications, treatment options, and expected outcomes were discussed with the patient. The possibilities of reaction to medication, pulmonary aspiration, bleeding, infection, the need for additional procedures, and creating a complication requiring transfusion or operation were discussed with the patient. The patient and/or family concurred with the proposed plan, giving informed consent.  The patient was taken to Operating Room, identified as Marbeth Smedley and the procedure verified as port placement. A Time Out was held and the above information confirmed.  Prior to the induction of general anesthesia, antibiotic prophylaxis was administered. General lma anesthesia was then administered and tolerated well. After the induction, the right neck and chest were prepped with Chloraprep and draped in sterile fashion. The patient was positioned in the supine position with bump behind shoulders.  An ultrasound was used to identify the right internal jugular vein. A needle was used to access the RIJ and a wire was threaded into the vein with ease. The needle was removed and the wire was confirmed to be in the IJ with ultrasound and fluoroscopy. A small skin incision was made over the wire and then the dilator and sheath were inserted over the wire in the Seldinger technique. The wire and dilator were removed.  Our attention was then turned to the port site and a 3cm incision was made below the right clavicle. A small pocket was created with blunt dissection. The tunneler was then used to create a  tunnel between the port site to the RIJ wire. The catheter was withdrawn from the IJ site to the port site and attached to the port. The catheter was then cut to length and inserted into the sheath. The sheath was broken to allow removal while keeping the catheter in place. The catheter was confirmed to be in appropriate location via fluoroscopy.  The port site was closed with 3-0 Vicryl interrupted sutures. Then 4-0 Monocryl was used to close the skin on the neck and at the port site. The port had excellent blood return. It was flushed with 5 cc's of concentrated heparin.  Skin glue was then applied. The patient was then extubated and brought to the recovery room in stable condition. Instrument, sponge, and needle counts were correct at closure and at the conclusion of the case.   Findings: RIJ port-a-cath insertion  Estimated Blood Loss:  Minimal  Specimens: none       Complications: None; patient tolerated the procedure well.         Disposition: PACU - hemodynamically stable.         Condition: stable   Ahmed Prima, MD M[facility] PGY 5   General Surgery

## 2020-09-01 ENCOUNTER — Ambulatory Visit: Payer: HMO | Attending: Surgery | Admitting: Physical Therapy

## 2020-09-01 ENCOUNTER — Other Ambulatory Visit: Payer: Self-pay

## 2020-09-01 ENCOUNTER — Encounter: Payer: Self-pay | Admitting: Hematology and Oncology

## 2020-09-01 ENCOUNTER — Inpatient Hospital Stay: Payer: HMO | Attending: Hematology and Oncology

## 2020-09-01 ENCOUNTER — Encounter: Payer: Self-pay | Admitting: Physical Therapy

## 2020-09-01 DIAGNOSIS — Z483 Aftercare following surgery for neoplasm: Secondary | ICD-10-CM | POA: Diagnosis not present

## 2020-09-01 DIAGNOSIS — R293 Abnormal posture: Secondary | ICD-10-CM | POA: Diagnosis not present

## 2020-09-01 DIAGNOSIS — C50511 Malignant neoplasm of lower-outer quadrant of right female breast: Secondary | ICD-10-CM | POA: Diagnosis not present

## 2020-09-01 DIAGNOSIS — Z006 Encounter for examination for normal comparison and control in clinical research program: Secondary | ICD-10-CM | POA: Insufficient documentation

## 2020-09-01 DIAGNOSIS — Z171 Estrogen receptor negative status [ER-]: Secondary | ICD-10-CM | POA: Diagnosis not present

## 2020-09-01 DIAGNOSIS — Z170 Estrogen receptor positive status [ER+]: Secondary | ICD-10-CM | POA: Insufficient documentation

## 2020-09-01 DIAGNOSIS — Z5112 Encounter for antineoplastic immunotherapy: Secondary | ICD-10-CM | POA: Insufficient documentation

## 2020-09-01 NOTE — Therapy (Signed)
[place], [place], [address] Phone: [phone]   Fax:  [phone]  Physical Therapy Treatment  Patient Details  Name: Toni [name] MRN: [id number] Date of Birth: 09-14-1956 Referring Provider (PT): Dr. Coralie Keens   Encounter Date: 09/01/2020   PT End of Session - 09/01/20 1050    Visit Number 2    Number of Visits 2    PT Start Time 1005    PT Stop Time 1044    PT Time Calculation (min) 39 min    Activity Tolerance Patient tolerated treatment well    Behavior During Therapy [facility] for tasks assessed/performed           Past Medical History:  Diagnosis Date  . AICD (automatic cardioverter/defibrillator) present   . Cancer [facility])    Right sided breast cancer  . CHF (congestive heart failure) ([place])   . Diabetes mellitus without complication ([place])   . Family history of breast cancer   . Family history of prostate cancer   . Family history of thyroid cancer   . Hypertension     Past Surgical History:  Procedure Laterality Date  . ABDOMINAL HYSTERECTOMY    . BREAST LUMPECTOMY WITH RADIOACTIVE SEED AND SENTINEL LYMPH NODE BIOPSY Right 08/11/2020   Procedure: RIGHT BREAST LUMPECTOMY WITH RADIOACTIVE SEED AND SENTINEL LYMPH NODE BIOPSY;  Surgeon: Coralie Keens, MD;  Location: [place];  Service: General;  Laterality: Right;  . CARDIAC CATHETERIZATION     in [facility], clean, per pt.  . ICD IMPLANT N/A 06/18/2019   Procedure: ICD IMPLANT;  Surgeon: Evans Lance, MD;  Location: [place] CV [facility];  Service: Cardiovascular;  Laterality: N/A;  . PORTACATH PLACEMENT N/A 08/31/2020   Procedure: INSERTION PORT-A-CATH WITH ULTRASOUND GUIDANCE;  Surgeon: Coralie Keens, MD;  Location: [place];  Service: General;  Laterality: N/A;  . RIGHT/LEFT HEART CATH AND CORONARY ANGIOGRAPHY N/A 11/17/2018   Procedure: RIGHT/LEFT HEART CATH AND CORONARY ANGIOGRAPHY;  Surgeon: Belva Crome, MD;   Location: [place] [facility];  Service: Cardiovascular;  Laterality: N/A;    There were no vitals filed for this visit.   Subjective Assessment - 09/01/20 1010    Subjective Patient reports she underwent a right lumpectomy and sentinel node biopsy (3 negative nodes) on 08/11/2020. She had a port placed 08/31/2020 because her tumor size was > 5 mm. She begins chemo on 09/08/2020.    Pertinent History Patient was diagnosed on 06/10/2020 with right grade III invasive ductal carcinoma breast cancer. She underwent a right lumpectomy and sentinel node biopsy (3 negative nodes) on 08/11/2020. It is ER/PR negative and HER2 positive with a Ki67 of 40%. She also has a history of heart failure and has an implanted defibrillator. 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    Patient Stated Goals See how my arm is    Currently in Pain? No/denies              [facility] PT Assessment - 09/01/20 0001      Assessment   Medical Diagnosis s/p right lumpectomy and SLNB    Referring Provider (PT) Dr. Coralie Keens    Onset Date/Surgical Date 08/11/20    Hand Dominance Right    Prior Therapy Baselines      Precautions   Precautions Other (comment)    Precaution Comments Right arm lymphedema      Restrictions   Weight Bearing Restrictions No  Home Environment   Living Environment Private residence    Living Arrangements Alone    Available Help at Discharge Friend(s)      Prior Function   Level of [place] Retired    Leisure She is not exercising      Cognition   Overall Cognitive Status Within Functional Limits for tasks assessed      Observation/Other Assessments   Observations Incisions in right axilla and breast appear to be healed with some scar tissue tightness present. Port incisions appear to not have redness or signs of infection.      Posture/Postural Control   Posture/Postural Control Postural limitations    Postural Limitations Rounded  Shoulders;Forward head      ROM / Strength   AROM / PROM / Strength AROM      AROM   AROM Assessment Site Shoulder    Right/Left Shoulder Right    Right Shoulder Extension 50 Degrees    Right Shoulder Flexion 134 Degrees    Right Shoulder ABduction 135 Degrees    Right Shoulder Internal Rotation 42 Degrees    Right Shoulder External Rotation 75 Degrees      Strength   Overall Strength Within functional limits for tasks performed             LYMPHEDEMA/ONCOLOGY QUESTIONNAIRE - 09/01/20 0001      Type   Cancer Type Right breast cancer      Surgeries   Lumpectomy Date 08/11/20    Sentinel Lymph Node Biopsy Date 08/11/20    Number Lymph Nodes Removed 3      Treatment   Active Chemotherapy Treatment No    Past Chemotherapy Treatment No    Active Radiation Treatment No    Past Radiation Treatment No    Current Hormone Treatment No    Past Hormone Therapy No      What other symptoms do you have   Are you Having Heaviness or Tightness No    Are you having Pain No    Are you having pitting edema No    Is it Hard or Difficult finding clothes that fit No    Do you have infections No    Is there Decreased scar mobility No    [place] Sign No      Lymphedema Assessments   Lymphedema Assessments Upper extremities      Right Upper Extremity Lymphedema   10 cm Proximal to Olecranon Process 29 cm    Olecranon Process 24.5 cm    10 cm Proximal to Ulnar Styloid Process 21.1 cm    Just Proximal to Ulnar Styloid Process 16 cm    Across Hand at [employer] 19.5 cm    At [place] of 2nd Digit 6.6 cm      Left Upper Extremity Lymphedema   10 cm Proximal to Olecranon Process 29.4 cm    Olecranon Process 25.3 cm    10 cm Proximal to Ulnar Styloid Process 20.6 cm    Just Proximal to Ulnar Styloid Process 15.8 cm    Across Hand at [employer] 19 cm    At [place] of 2nd Digit 6.5 cm              Quick Dash - 09/01/20 0001    Open a tight or new jar Severe difficulty     Do heavy household chores (wash walls, wash floors) Moderate difficulty    Carry a shopping bag or briefcase No  difficulty    Wash your back Moderate difficulty    Use a knife to cut food No difficulty    Recreational activities in which you take some force or impact through your arm, shoulder, or hand (golf, hammering, tennis) Unable    During the past week, to what extent has your arm, shoulder or hand problem interfered with your normal social activities with family, friends, neighbors, or groups? Not at all    During the past week, to what extent has your arm, shoulder or hand problem limited your work or other regular daily activities Not at all    Arm, shoulder, or hand pain. Mild    Tingling (pins and needles) in your arm, shoulder, or hand None    Difficulty Sleeping No difficulty    DASH Score 27.27 %                               PT Long Term Goals - 09/01/20 1051      PT LONG TERM GOAL #1   Title Patient will demonstrate she has regained full shoulder RMm and function post operatively compared to baseline assessment.    Time 8    Period Weeks    Status Not Met   Not met due to frozen shoulder                Plan - 09/01/20 1049    Clinical Impression Statement Patient is doing well s/p right lumpectomy and sentinel node biopsy (3 negative nodes) on 08/11/2020. Her shoulder ROM and function is nearly back to baseline (which is limited by prior frozen shoulders), she has no signs of lymphedema, and her incisions are healing well. She plans to attend the After Breast Cancer class on 09/05/2020 for lymphedema education. She would benefit from PT to improve shoulder mobility but is starting chemotherapy next week and wants to hold off. She knows to call us if needed.    PT Treatment/Interventions ADLs/Self Care Home Management;Therapeutic exercise;Patient/family education    PT Next Visit Plan D/C    PT Home Exercise Plan Post op shoulder ROM HEP     Consulted and Agree with Plan of Care Patient           Patient will benefit from skilled therapeutic intervention in order to improve the following deficits and impairments:  Postural dysfunction, Decreased knowledge of precautions, Impaired UE functional use, Pain, Decreased range of motion  Visit Diagnosis: Malignant neoplasm of lower-outer quadrant of right breast of female, estrogen receptor negative ([name])  Abnormal posture  Aftercare following surgery for neoplasm     Problem List Patient Active Problem List   Diagnosis Date Noted  . Genetic testing 07/28/2020  . Family history of breast cancer   . Family history of prostate cancer   . Family history of thyroid cancer   . Malignant neoplasm of lower-outer quadrant of right breast of female, estrogen receptor positive ([place]) 07/01/2020  . 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 Nonischemic cardiomyopathy ([place]) 06/04/2019  . Hypomagnesemia 11/18/2018  . Hypokalemia 11/15/2018  . Diabetes mellitus without complication ([place])   . Coronary artery disease   . Acute on chronic systolic CHF (congestive heart failure) ([place]) 01/04/2016  . Hyperlipidemia 01/03/2016  . Cardiomegaly - hypertensive 01/03/2016  . DM (diabetes mellitus), type 2 with complications ([place]) 38/33/3832  . Hypertension 01/03/2014   PHYSICAL THERAPY DISCHARGE SUMMARY  Visits from Start of Care: 2  Current functional level related to goals / functional outcomes: Goals not met as pt has a pre-existing condition of a frozen shoulder and has not regained ROM. PT was recommended but she wants to see how chemo goes.   Remaining deficits: Shoulder ROM deficits.   Education / Equipment: HEP and lymphedema education Plan: Patient agrees to discharge.  Patient goals were not met. Patient is being discharged due to the patient's request.  ?????         Annia Friendly, [place] 09/01/20 10:53 AM  [place], [place], [address] Phone: [phone]   Fax:  [phone]  Name: Adriyana Greenbaum MRN: [id number] Date of Birth: 1956-08-31

## 2020-09-01 NOTE — Anesthesia Postprocedure Evaluation (Signed)
Anesthesia Post Note  Patient: Chiquitta Matty  Procedure(s) Performed: INSERTION PORT-A-CATH WITH ULTRASOUND GUIDANCE (N/A Chest)     Patient location during evaluation: PACU Anesthesia Type: General Level of consciousness: awake and alert Pain management: pain level controlled Vital Signs Assessment: post-procedure vital signs reviewed and stable Respiratory status: spontaneous breathing, nonlabored ventilation, respiratory function stable and patient connected to nasal cannula oxygen Cardiovascular status: blood pressure returned to baseline and stable Postop Assessment: no apparent nausea or vomiting Anesthetic complications: no   No complications documented.  Last Vitals:  Vitals:   08/31/20 1500 08/31/20 1515  BP: 131/77 132/76  Pulse:    Resp:    Temp:    SpO2:      Last Pain:  Vitals:   08/31/20 1530  TempSrc:   PainSc: 0-No pain                 Sukhman Kocher P Faduma Cho

## 2020-09-01 NOTE — Patient Instructions (Signed)
° ° °          [facility]         [address], [place] [address]         [phone]         Annia Friendly, PT, CLT   After Breast Cancer Class It is recommended you attend the ABC class to be educated on lymphedema risk reduction. This class is free of charge and lasts for 1 hour. It is a 1-time class.  You are scheduled for October 18th at 11:00.  Scar massage You can begin gentle scar massage to your armpit and breast incision (not the port incisions). Use coconut oil.   Home exercise Program Continue your exercises. Call us if you want to try PT for frozen shoulders.

## 2020-09-01 NOTE — Progress Notes (Signed)
Met with patient at registration to introduce myself as Financial Resource Specialist and to offer available resources.  Discussed one-time $1000 [employer] grant and qualifications to assist with personal expenses while going through treatment. Advised what is needed to apply.  Gave her my card if interested in applying and for any additional financial questions or concerns. 

## 2020-09-02 ENCOUNTER — Ambulatory Visit (HOSPITAL_COMMUNITY)
Admission: RE | Admit: 2020-09-02 | Discharge: 2020-09-02 | Disposition: A | Discharge status: Home / Self Care | Payer: HMO | Source: Ambulatory Visit | Attending: Internal Medicine | Admitting: Internal Medicine

## 2020-09-02 DIAGNOSIS — I428 Other cardiomyopathies: Secondary | ICD-10-CM | POA: Insufficient documentation

## 2020-09-02 LAB — BASIC METABOLIC PANEL
Anion gap: 9 (ref 5–15)
BUN: 20 mg/dL (ref 8–23)
CO2: 25 mmol/L (ref 22–32)
Calcium: 9.4 mg/dL (ref 8.9–10.3)
Chloride: 104 mmol/L (ref 98–111)
Creatinine, Ser: 0.84 mg/dL (ref 0.44–1.00)
GFR, Estimated: >60 mL/min (ref >60)
Glucose, Bld: 254 mg/dL — ABNORMAL HIGH (ref 70–99)
Potassium: 4.4 mmol/L (ref 3.5–5.1)
Sodium: 138 mmol/L (ref 135–145)

## 2020-09-02 NOTE — Progress Notes (Signed)
Pharmacist Chemotherapy Monitoring - Initial Assessment    Anticipated start date: 09/08/20   Regimen:  . Are orders appropriate based on the patient's diagnosis, regimen, and cycle? Yes . Does the plan date match the patient's scheduled date? Yes . Is the sequencing of drugs appropriate? Yes . Are the premedications appropriate for the patient's regimen? Yes . Prior Authorization for treatment is: Pending o If applicable, is the correct biosimilar selected based on the patient's insurance? not applicable  Organ Function and Labs: Marland Kitchen Are dose adjustments needed based on the patient's renal function, hepatic function, or hematologic function? No . Are appropriate labs ordered prior to the start of patient's treatment? Yes . Other organ system assessment, if indicated: trastuzumab: Echo/ MUGA . The following baseline labs, if indicated, have been ordered: N/A  Dose Assessment: . Are the drug doses appropriate? Yes . Are the following correct: o Drug concentrations Yes o IV fluid compatible with drug Yes o Administration routes Yes o Timing of therapy Yes . If applicable, does the patient have documented access for treatment and/or plans for port-a-cath placement? not applicable . If applicable, have lifetime cumulative doses been properly documented and assessed? not applicable Lifetime Dose Tracking  No doses have been documented on this patient for the following tracked chemicals: Doxorubicin, Epirubicin, Idarubicin, Daunorubicin, Mitoxantrone, Bleomycin, Oxaliplatin, Carboplatin, Liposomal Doxorubicin  o   Toxicity Monitoring/Prevention: . The patient has the following take home antiemetics prescribed: Prochlorperazine and Dexamethasone . The patient has the following take home medications prescribed: N/A . Medication allergies and previous infusion related reactions, if applicable, have been reviewed and addressed. Yes . The patient's current medication list has been assessed for  drug-drug interactions with their chemotherapy regimen. no significant drug-drug interactions were identified on review.  Order Review: . Are the treatment plan orders signed? Yes . Is the patient scheduled to see a provider prior to their treatment? Yes  I verify that I have reviewed each item in the above checklist and answered each question accordingly.  Jezabel Lecker K 09/02/2020 11:10 AM

## 2020-09-05 ENCOUNTER — Telehealth: Payer: Self-pay | Admitting: Medical Oncology

## 2020-09-05 NOTE — Telephone Encounter (Signed)
[id number]: Outgoing call. I spoke with patient this afternoon, making sure that she is informed that I will be completing her baseline study assessments, when she is in-clinic for her first chemo treatment, on the 21st of this week. Patient gave her verbal understanding and denied having any questions. I thanked patient for her time and encouraged her to call me with questions, in the meantime.  Maxwell Marion, RN, BSN, [facility] Clinical Research 09/05/2020 1:28 PM

## 2020-09-07 ENCOUNTER — Other Ambulatory Visit: Payer: Self-pay | Admitting: Medical Oncology

## 2020-09-07 DIAGNOSIS — Z170 Estrogen receptor positive status [ER+]: Secondary | ICD-10-CM

## 2020-09-07 DIAGNOSIS — C50511 Malignant neoplasm of lower-outer quadrant of right female breast: Secondary | ICD-10-CM

## 2020-09-07 NOTE — Assessment & Plan Note (Signed)
07/01/2020:Screening mammogram showed a right breast mass. Mammogram and US showed a 0.7cm mass at the 6 o'clock position in the right breast, no axillary adenopathy. 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 T1BN0 stage Ia  08/11/2020:Right lumpectomy [name]): IDC, grade 3, 0.8cm, clear margins, 3 right axillary lymph nodes negative for carcinoma.  ER 30% week, PR 0%, HER-2 positive, Ki-67 40%  Treatment plan: 1. adjuvant Taxol Herceptin  2.Adjuvant radiation therapy 3.Follow-up adjuvant antiestrogen therapy Patient is participating in the neuropathy clinical trial SWOG S1714 --------------------------------------------------------------------------------------------------------------------------------------- Current treatment: Cycle 1 day 1 Taxol Herceptin Echocardiogram 08/22/2020: EF 40% Patient has seen cardiology Dr. Kirk Ruths.  Her systolic function is worrisome. We decided to proceed with Herceptin and recheck echocardiogram in 1 month. Monitoring closely for toxicities.

## 2020-09-07 NOTE — Progress Notes (Signed)
Patient Care Team: Donald Prose, MD as PCP - General (Family Medicine) Belva Crome, MD as PCP - Cardiology (Cardiology) Larey Dresser, MD as PCP - Advanced Heart Failure (Cardiology) Dannielle Karvonen, RN as [place] [facility] Tressie Ellis, Paulette Blanch, RN as Registered Nurse Rockwell Germany, RN as Oncology Nurse Navigator Coralie Keens, MD as Consulting Physician (General Surgery) Nicholas Lose, MD as Consulting Physician (Hematology and Oncology) Kyung Rudd, MD as Consulting Physician (Radiation Oncology)  DIAGNOSIS:    ICD-10-CM   1. Malignant neoplasm of lower-outer quadrant of right breast of female, estrogen receptor positive ([place])  C50.511    Z17.0     SUMMARY OF ONCOLOGIC HISTORY: Oncology History  Malignant neoplasm of lower-outer quadrant of right breast of female, estrogen receptor positive ([place])  07/01/2020 Initial Diagnosis   Screening mammogram showed a right breast mass. Mammogram and US showed a 0.7cm mass at the 6 o'clock position in the right breast, no axillary adenopathy. 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    07/06/2020 Cancer Staging   Staging form: Breast, AJCC 8th Edition - Clinical stage from 07/06/2020: Stage IB (cT1b, cN0, cM0, G3, ER+, PR-, HER2-) - Signed by Nicholas Lose, MD on 07/06/2020   07/26/2020 Genetic Testing   Negative genetic testing:  No pathogenic variants detected on the [facility] Common Hereditary Cancers Panel. The report date is 07/26/2020.   The Common Hereditary Cancers Panel offered by [facility] includes sequencing and/or deletion duplication testing of the following 48 genes: APC, ATM, AXIN2, BARD1, BMPR1A, BRCA1, BRCA2, BRIP1, CDH1, CDK4, CDKN2A (p14ARF), CDKN2A (p16INK4a), CHEK2, CTNNA1, DICER1, EPCAM (Deletion/duplication testing only), GREM1 (promoter region deletion/duplication testing only), KIT, MEN1, MLH1, MSH2, MSH3, MSH6,  MUTYH, NBN, NF1, NTHL1, PALB2, PDGFRA, PMS2, POLD1, POLE, PTEN, RAD50, RAD51C, RAD51D, RNF43, SDHB, SDHC, SDHD, SMAD4, SMARCA4. STK11, TP53, TSC1, TSC2, and VHL.  The following genes were evaluated for sequence changes only: SDHA and HOXB13 c.251G>A variant only.   08/11/2020 Surgery   Right lumpectomy [name]): IDC, grade 3, 0.8cm, clear margins, 3 right axillary lymph nodes negative for carcinoma. ER 30% week, PR 0%, HER-2 positive, Ki-67 40%   08/18/2020 Cancer Staging   Staging form: Breast, AJCC 8th Edition - Pathologic stage from 08/18/2020: Stage IA (pT1b, pN0, cM0, G3, ER+, PR-, HER2-) - Signed by Nicholas Lose, MD on 08/18/2020   09/08/2020 -  Chemotherapy   The patient had PACLitaxel (TAXOL) 144 mg in sodium chloride 0.9 % 250 mL chemo infusion (</= 55m/m2), 80 mg/m2 = 144 mg, Intravenous,  Once, 0 of 3 cycles trastuzumab-dkst (OGIVRI) 273 mg in sodium chloride 0.9 % 250 mL chemo infusion, 4 mg/kg = 273 mg, Intravenous,  Once, 0 of 16 cycles  for chemotherapy treatment.      CHIEF COMPLIANT: Cycle 1 Taxol Herceptin  INTERVAL HISTORY: Toni [name] a 64y.o. with above-mentioned history of right breast cancer who underwent a right lumpectomy and is currently on adjuvant chemotherapy with Taxol Herceptin. She presents to the clinic today for cycle 1.   ALLERGIES:  is allergic to biaxin [clarithromycin], enalapril maleate, and vasotec [enalapril].  MEDICATIONS:  Current Outpatient Medications  Medication Sig Dispense Refill  . aspirin 81 MG chewable tablet Chew 1 tablet (81 mg total) by mouth daily. 30 tablet 0  . carvedilol (COREG) 25 MG tablet TAKE 1 TABLET (25 MG TOTAL) BY MOUTH 2 (TWO) TIMES DAILY WITH A MEAL.  180 tablet 3  . cetirizine (ZYRTEC) 10 MG tablet Take 10 mg by mouth daily.     . digoxin (LANOXIN) 0.125 MG tablet Take 0.5 tablets (0.0625 mg total) by mouth daily. 45 tablet 1  . furosemide (LASIX) 20 MG tablet Take 20 mg by mouth daily as needed for fluid or  edema.     Marland Kitchen glipiZIDE (GLUCOTROL XL) 10 MG 24 hr tablet Take 20 mg by mouth daily with breakfast.     . hydrALAZINE (APRESOLINE) 50 MG tablet TAKE 1 TABLET (50 MG TOTAL) BY MOUTH 3 (THREE) TIMES DAILY. 270 tablet 1  . Insulin Glargine (BASAGLAR KWIKPEN) 100 UNIT/ML Inject 20 Units into the skin at bedtime.     . isosorbide dinitrate (ISORDIL) 20 MG tablet TAKE 1 TABLET (20 MG TOTAL) BY MOUTH 3 TIMES DAILY. (Patient taking differently: Take 20 mg by mouth 3 (three) times daily. ) 90 tablet 11  . JARDIANCE 25 MG TABS tablet Take 25 mg by mouth daily.    Marland Kitchen lidocaine-prilocaine (EMLA) cream Apply to affected area once (Patient not taking: Reported on 08/23/2020) 30 g 3  . LINZESS 145 MCG CAPS capsule Take 145 mcg by mouth daily as needed (constipation). Constipation    . losartan (COZAAR) 25 MG tablet Take 2 tablets (50 mg total) by mouth in the morning AND 1 tablet (25 mg total) every evening. 90 tablet 3  . metFORMIN (GLUCOPHAGE) 1000 MG tablet Take 1,000 mg by mouth 2 (two) times daily with a meal.    . Multiple Vitamin (MULTIVITAMIN WITH MINERALS) TABS tablet Take 1 tablet by mouth daily. Centrum Silver    . ondansetron (ZOFRAN) 8 MG tablet Take 1 tablet (8 mg total) by mouth 2 (two) times daily as needed (Nausea or vomiting). (Patient not taking: Reported on 08/22/2020) 30 tablet 1  . OZEMPIC, 1 MG/DOSE, 4 MG/3ML SOPN Inject 1 mg into the skin every Saturday.     . prochlorperazine (COMPAZINE) 10 MG tablet Take 1 tablet (10 mg total) by mouth every 6 (six) hours as needed (Nausea or vomiting). (Patient not taking: Reported on 08/22/2020) 30 tablet 1  . simvastatin (ZOCOR) 20 MG tablet Take 20 mg by mouth at bedtime.     Marland Kitchen spironolactone (ALDACTONE) 25 MG tablet TAKE 1 TABLET BY MOUTH EVERY EVENING. (Patient taking differently: Take 25 mg by mouth at bedtime. ) 90 tablet 3  . traMADol (ULTRAM) 50 MG tablet Take 1-2 tablets (50-100 mg total) by mouth every 6 (six) hours as needed for moderate pain.  (Patient not taking: Reported on 08/22/2020) 20 tablet 0  . TRUE METRIX BLOOD GLUCOSE TEST test strip 1 each by Other route as directed.  (Patient not taking: Reported on 08/22/2020)  5  . TRUEPLUS LANCETS 30G MISC 1 each by Other route as directed. Use as directed. (Patient not taking: Reported on 08/22/2020)  5  . UNIFINE PENTIPS 32G X 4 MM MISC 1 each by Other route as directed.  (Patient not taking: Reported on 08/22/2020)     No current facility-administered medications for this visit.    PHYSICAL EXAMINATION: ECOG PERFORMANCE STATUS: 1 - Symptomatic but completely ambulatory  Vitals:   09/08/20 0850  BP: 124/68  Pulse: 80  Resp: 18  Temp: 97.6 F (36.4 C)  SpO2: 100%   Filed Weights   09/08/20 0850  Weight: 157 lb 3.2 oz (71.3 kg)    LABORATORY DATA:  I have reviewed the data as listed CMP Latest Ref Rng & Units  09/02/2020 08/22/2020 08/04/2020  Glucose 70 - 99 mg/dL 254(H) 129(H) 204(H)  BUN 8 - 23 mg/dL _0 Creatinine 0.44 - 1.00 mg/dL 0.84 0.69 0.84  Sodium 135 - 145 mmol/L 138 140 141  Potassium 3.5 - 5.1 mmol/L 4.4 4.0 4.6  Chloride 98 - 111 mmol/L 104 103 107  CO2 22 - 32 mmol/L _1 Calcium 8.9 - 10.3 mg/dL 9.4 9.9 9.9  Total Protein 6.5 - 8.1 g/dL - - -  Total Bilirubin 0.3 - 1.2 mg/dL - - -  Alkaline Phos 38 - 126 U/L - - -  AST 15 - 41 U/L - - -  ALT 0 - 44 U/L - - -    Lab Results  Component Value Date   WBC 6.0 09/08/2020   HGB 12.9 09/08/2020   HCT 40.4 09/08/2020   MCV 89.4 09/08/2020   PLT 221 09/08/2020   NEUTROABS 3.1 09/08/2020    ASSESSMENT & PLAN:  Malignant neoplasm of lower-outer quadrant of right breast of female, estrogen receptor positive ([place]) 07/01/2020:Screening mammogram showed a right breast mass. Mammogram and US showed a 0.7cm mass at the 6 o'clock position in the right breast, no axillary adenopathy. Biopsy showed invasive mammary carcinoma, grade 3, HER-2 equivocal by IHC (2+), positive by FISH, ER+ 30%, PR- 0%, Ki67  40%. T1BN0 stage Ia  08/11/2020:Right lumpectomy [name]): IDC, grade 3, 0.8cm, clear margins, 3 right axillary lymph nodes negative for carcinoma.  ER 30% week, PR 0%, HER-2 positive, Ki-67 40%  Treatment plan: 1. adjuvant Taxol Herceptin  2.Adjuvant radiation therapy 3.Follow-up adjuvant antiestrogen therapy Patient is participating in the neuropathy clinical trial SWOG S1714 --------------------------------------------------------------------------------------------------------------------------------------- Current treatment: Cycle 1 day 1 Taxol Herceptin Echocardiogram 08/22/2020: EF 40% Patient has seen cardiology Dr. Kirk Ruths.  Her systolic function is worrisome.  But after careful consideration, we decided to proceed with the treatment. I will avoid doing the bolus Herceptin today. We decided to proceed with Herceptin and recheck echocardiogram after 3 cycles. Monitoring closely for toxicities.   No orders of the defined types were placed in this encounter.  The patient has a good understanding of the overall plan. she agrees with it. she will call with any problems that may develop before the next visit here.  Total time spent: 30 mins including face to face time and time spent for planning, charting and coordination of care  Nicholas Lose, MD 09/08/2020  I, Cloyde Reams Dorshimer, am acting as scribe for Dr. Nicholas Lose.  I have reviewed the above documentation for accuracy and completeness, and I agree with the above.

## 2020-09-08 ENCOUNTER — Encounter: Payer: Self-pay | Admitting: *Deleted

## 2020-09-08 ENCOUNTER — Inpatient Hospital Stay: Payer: HMO

## 2020-09-08 ENCOUNTER — Other Ambulatory Visit: Payer: Self-pay | Admitting: Medical Oncology

## 2020-09-08 ENCOUNTER — Encounter: Payer: Self-pay | Admitting: Medical Oncology

## 2020-09-08 ENCOUNTER — Other Ambulatory Visit: Payer: Self-pay | Admitting: *Deleted

## 2020-09-08 ENCOUNTER — Inpatient Hospital Stay (HOSPITAL_BASED_OUTPATIENT_CLINIC_OR_DEPARTMENT_OTHER): Payer: HMO | Admitting: Hematology and Oncology

## 2020-09-08 ENCOUNTER — Other Ambulatory Visit: Payer: Self-pay

## 2020-09-08 VITALS — BP 165/85 | HR 102 | Temp 98.4°F | Resp 16

## 2020-09-08 DIAGNOSIS — C50511 Malignant neoplasm of lower-outer quadrant of right female breast: Secondary | ICD-10-CM | POA: Diagnosis not present

## 2020-09-08 DIAGNOSIS — Z006 Encounter for examination for normal comparison and control in clinical research program: Secondary | ICD-10-CM | POA: Diagnosis present

## 2020-09-08 DIAGNOSIS — Z170 Estrogen receptor positive status [ER+]: Secondary | ICD-10-CM

## 2020-09-08 DIAGNOSIS — Z95828 Presence of other vascular implants and grafts: Secondary | ICD-10-CM

## 2020-09-08 DIAGNOSIS — Z5112 Encounter for antineoplastic immunotherapy: Secondary | ICD-10-CM | POA: Diagnosis not present

## 2020-09-08 LAB — CBC WITH DIFFERENTIAL (CANCER CENTER ONLY)
Abs Immature Granulocytes: 0.02 10*3/uL (ref 0.00–0.07)
Basophils Absolute: 0 10*3/uL (ref 0.0–0.1)
Basophils Relative: 0 %
Eosinophils Absolute: 0.2 10*3/uL (ref 0.0–0.5)
Eosinophils Relative: 4 %
HCT: 40.4 % (ref 36.0–46.0)
Hemoglobin: 12.9 g/dL (ref 12.0–15.0)
Immature Granulocytes: 0 %
Lymphocytes Relative: 35 %
Lymphs Abs: 2.1 10*3/uL (ref 0.7–4.0)
MCH: 28.5 pg (ref 26.0–34.0)
MCHC: 31.9 g/dL (ref 30.0–36.0)
MCV: 89.4 fL (ref 80.0–100.0)
Monocytes Absolute: 0.6 10*3/uL (ref 0.1–1.0)
Monocytes Relative: 10 %
Neutro Abs: 3.1 10*3/uL (ref 1.7–7.7)
Neutrophils Relative %: 51 %
Platelet Count: 221 10*3/uL (ref 150–400)
RBC: 4.52 MIL/uL (ref 3.87–5.11)
RDW: 12.9 % (ref 11.5–15.5)
WBC Count: 6 10*3/uL (ref 4.0–10.5)
nRBC: 0 % (ref 0.0–0.2)

## 2020-09-08 LAB — CMP (CANCER CENTER ONLY)
ALT: 15 U/L (ref 0–44)
AST: 16 U/L (ref 15–41)
Albumin: 3.5 g/dL (ref 3.5–5.0)
Alkaline Phosphatase: 55 U/L (ref 38–126)
Anion gap: 7 (ref 5–15)
BUN: 12 mg/dL (ref 8–23)
CO2: 29 mmol/L (ref 22–32)
Calcium: 9.8 mg/dL (ref 8.9–10.3)
Chloride: 106 mmol/L (ref 98–111)
Creatinine: 0.76 mg/dL (ref 0.44–1.00)
GFR, Estimated: >60 mL/min (ref >60)
Glucose, Bld: 110 mg/dL — ABNORMAL HIGH (ref 70–99)
Potassium: 4.1 mmol/L (ref 3.5–5.1)
Sodium: 142 mmol/L (ref 135–145)
Total Bilirubin: <0.2 mg/dL — ABNORMAL LOW (ref 0.3–1.2)
Total Protein: 7 g/dL (ref 6.5–8.1)

## 2020-09-08 LAB — RESEARCH LABS

## 2020-09-08 MED ORDER — FAMOTIDINE IN NACL 20-0.9 MG/50ML-% IV SOLN
INTRAVENOUS | Status: AC
  Filled 2020-09-08: qty 50

## 2020-09-08 MED ORDER — SODIUM CHLORIDE 0.9 % IV SOLN
Freq: Once | INTRAVENOUS | Status: AC
  Filled 2020-09-08: qty 250

## 2020-09-08 MED ORDER — ACETAMINOPHEN 325 MG PO TABS
ORAL_TABLET | ORAL | Status: AC
  Filled 2020-09-08: qty 2

## 2020-09-08 MED ORDER — TRASTUZUMAB-DKST CHEMO 150 MG IV SOLR
2.0000 mg/kg | Freq: Once | INTRAVENOUS | Status: AC
  Administered 2020-09-08: 147 mg via INTRAVENOUS
  Filled 2020-09-08: qty 7

## 2020-09-08 MED ORDER — SODIUM CHLORIDE 0.9% FLUSH
10.0000 mL | Freq: Once | INTRAVENOUS | Status: AC
  Administered 2020-09-08: 10 mL
  Filled 2020-09-08: qty 10

## 2020-09-08 MED ORDER — DIPHENHYDRAMINE HCL 50 MG/ML IJ SOLN
25.0000 mg | Freq: Once | INTRAMUSCULAR | Status: AC
  Administered 2020-09-08: 25 mg via INTRAVENOUS

## 2020-09-08 MED ORDER — DIPHENHYDRAMINE HCL 50 MG/ML IJ SOLN
INTRAMUSCULAR | Status: AC
  Filled 2020-09-08: qty 1

## 2020-09-08 MED ORDER — HEPARIN SOD (PORK) LOCK FLUSH 100 UNIT/ML IV SOLN
500.0000 [IU] | Freq: Once | INTRAVENOUS | Status: AC | PRN
  Administered 2020-09-08: 500 [IU]
  Filled 2020-09-08: qty 5

## 2020-09-08 MED ORDER — SODIUM CHLORIDE 0.9 % IV SOLN
80.0000 mg/m2 | Freq: Once | INTRAVENOUS | Status: AC
  Administered 2020-09-08: 144 mg via INTRAVENOUS
  Filled 2020-09-08: qty 24

## 2020-09-08 MED ORDER — SODIUM CHLORIDE 0.9 % IV SOLN
10.0000 mg | Freq: Once | INTRAVENOUS | Status: AC
  Administered 2020-09-08: 10 mg via INTRAVENOUS
  Filled 2020-09-08: qty 10

## 2020-09-08 MED ORDER — FAMOTIDINE IN NACL 20-0.9 MG/50ML-% IV SOLN
20.0000 mg | Freq: Once | INTRAVENOUS | Status: AC
  Administered 2020-09-08: 20 mg via INTRAVENOUS

## 2020-09-08 MED ORDER — ACETAMINOPHEN 325 MG PO TABS
650.0000 mg | ORAL_TABLET | Freq: Once | ORAL | Status: AC
  Administered 2020-09-08: 650 mg via ORAL

## 2020-09-08 MED ORDER — SODIUM CHLORIDE 0.9% FLUSH
10.0000 mL | INTRAVENOUS | Status: DC | PRN
  Administered 2020-09-08: 10 mL
  Filled 2020-09-08: qty 10

## 2020-09-08 NOTE — Patient Instructions (Signed)
[place] Discharge Instructions for Patients Receiving Chemotherapy  Today you received the following chemotherapy agents: trastuzumab and paclitaxel  To help prevent nausea and vomiting after your treatment, we encourage you to take your nausea medication as needed.   If you develop nausea and vomiting that is not controlled by your nausea medication, call the clinic.   BELOW ARE SYMPTOMS THAT SHOULD BE REPORTED IMMEDIATELY:  *FEVER GREATER THAN 100.5 F  *CHILLS WITH OR WITHOUT FEVER  NAUSEA AND VOMITING THAT IS NOT CONTROLLED WITH YOUR NAUSEA MEDICATION  *UNUSUAL SHORTNESS OF BREATH  *UNUSUAL BRUISING OR BLEEDING  TENDERNESS IN MOUTH AND THROAT WITH OR WITHOUT PRESENCE OF ULCERS  *URINARY PROBLEMS  *BOWEL PROBLEMS  UNUSUAL RASH Items with * indicate a potential emergency and should be followed up as soon as possible.  Feel free to call the clinic should you have any questions or concerns. The clinic phone number is [phone].  Please show the [facility] at check-in to the Emergency Department and triage nurse.  Trastuzumab injection for infusion What is this medicine? TRASTUZUMAB (tras TOO zoo mab) is a monoclonal antibody. It is used to treat breast cancer and stomach cancer. This medicine may be used for other purposes; ask your health care provider or pharmacist if you have questions. COMMON BRAND NAME(S): Herceptin, Galvin Proffer, Trazimera What should I tell my health care provider before I take this medicine? They need to know if you have any of these conditions:  heart disease  heart failure  lung or breathing disease, like asthma  an unusual or allergic reaction to trastuzumab, benzyl alcohol, or other medications, foods, dyes, or preservatives  pregnant or trying to get pregnant  breast-feeding How should I use this medicine? This drug is given as an infusion into a vein. It is administered in a  hospital or clinic by a specially trained health care professional. Talk to your pediatrician regarding the use of this medicine in children. This medicine is not approved for use in children. Overdosage: If you think you have taken too much of this medicine contact a poison control center or emergency room at once. NOTE: This medicine is only for you. Do not share this medicine with others. What if I miss a dose? It is important not to miss a dose. Call your doctor or health care professional if you are unable to keep an appointment. What may interact with this medicine? This medicine may interact with the following medications:  certain types of chemotherapy, such as daunorubicin, doxorubicin, epirubicin, and idarubicin This list may not describe all possible interactions. Give your health care provider a list of all the medicines, herbs, non-prescription drugs, or dietary supplements you use. Also tell them if you smoke, drink alcohol, or use illegal drugs. Some items may interact with your medicine. What should I watch for while using this medicine? Visit your doctor for checks on your progress. Report any side effects. Continue your course of treatment even though you feel ill unless your doctor tells you to stop. Call your doctor or health care professional for advice if you get a fever, chills or sore throat, or other symptoms of a cold or flu. Do not treat yourself. Try to avoid being around people who are sick. You may experience fever, chills and shaking during your first infusion. These effects are usually mild and can be treated with other medicines. Report any side effects during the infusion to your health care professional. Fever  and chills usually do not happen with later infusions. Do not become pregnant while taking this medicine or for 7 months after stopping it. Women should inform their doctor if they wish to become pregnant or think they might be pregnant. Women of child-bearing  potential will need to have a negative pregnancy test before starting this medicine. There is a potential for serious side effects to an unborn child. Talk to your health care professional or pharmacist for more information. Do not breast-feed an infant while taking this medicine or for 7 months after stopping it. Women must use effective birth control with this medicine. What side effects may I notice from receiving this medicine? Side effects that you should report to your doctor or health care professional as soon as possible:  allergic reactions like skin rash, itching or hives, swelling of the face, lips, or tongue  chest pain or palpitations  cough  dizziness  feeling faint or lightheaded, falls  fever  general ill feeling or flu-like symptoms  signs of worsening heart failure like breathing problems; swelling in your legs and feet  unusually weak or tired Side effects that usually do not require medical attention (report to your doctor or health care professional if they continue or are bothersome):  bone pain  changes in taste  diarrhea  joint pain  nausea/vomiting  weight loss This list may not describe all possible side effects. Call your doctor for medical advice about side effects. You may report side effects to FDA at 1-800-FDA-1088. Where should I keep my medicine? This drug is given in a hospital or clinic and will not be stored at home. NOTE: This sheet is a summary. It may not cover all possible information. If you have questions about this medicine, talk to your doctor, pharmacist, or health care provider.  2020 Elsevier/Gold Standard (2016-10-30 14:37:52) Paclitaxel injection What is this medicine? PACLITAXEL (PAK li TAX el) is a chemotherapy drug. It targets fast dividing cells, like cancer cells, and causes these cells to die. This medicine is used to treat ovarian cancer, breast cancer, lung cancer, Kaposi's sarcoma, and other cancers. This medicine  may be used for other purposes; ask your health care provider or pharmacist if you have questions. COMMON BRAND NAME(S): Onxol, Taxol What should I tell my health care provider before I take this medicine? They need to know if you have any of these conditions:  history of irregular heartbeat  liver disease  low blood counts, like low white cell, platelet, or red cell counts  lung or breathing disease, like asthma  tingling of the fingers or toes, or other nerve disorder  an unusual or allergic reaction to paclitaxel, alcohol, polyoxyethylated castor oil, other chemotherapy, other medicines, foods, dyes, or preservatives  pregnant or trying to get pregnant  breast-feeding How should I use this medicine? This drug is given as an infusion into a vein. It is administered in a hospital or clinic by a specially trained health care professional. Talk to your pediatrician regarding the use of this medicine in children. Special care may be needed. Overdosage: If you think you have taken too much of this medicine contact a poison control center or emergency room at once. NOTE: This medicine is only for you. Do not share this medicine with others. What if I miss a dose? It is important not to miss your dose. Call your doctor or health care professional if you are unable to keep an appointment. What may interact with this medicine? Do  not take this medicine with any of the following medications:  disulfiram  metronidazole This medicine may also interact with the following medications:  antiviral medicines for hepatitis, HIV or AIDS  certain antibiotics like erythromycin and clarithromycin  certain medicines for fungal infections like ketoconazole and itraconazole  certain medicines for seizures like carbamazepine, phenobarbital, phenytoin  gemfibrozil  nefazodone  rifampin  St. John's wort This list may not describe all possible interactions. Give your health care provider a  list of all the medicines, herbs, non-prescription drugs, or dietary supplements you use. Also tell them if you smoke, drink alcohol, or use illegal drugs. Some items may interact with your medicine. What should I watch for while using this medicine? Your condition will be monitored carefully while you are receiving this medicine. You will need important blood work done while you are taking this medicine. This medicine can cause serious allergic reactions. To reduce your risk you will need to take other medicine(s) before treatment with this medicine. If you experience allergic reactions like skin rash, itching or hives, swelling of the face, lips, or tongue, tell your doctor or health care professional right away. In some cases, you may be given additional medicines to help with side effects. Follow all directions for their use. This drug may make you feel generally unwell. This is not uncommon, as chemotherapy can affect healthy cells as well as cancer cells. Report any side effects. Continue your course of treatment even though you feel ill unless your doctor tells you to stop. Call your doctor or health care professional for advice if you get a fever, chills or sore throat, or other symptoms of a cold or flu. Do not treat yourself. This drug decreases your body's ability to fight infections. Try to avoid being around people who are sick. This medicine may increase your risk to bruise or bleed. Call your doctor or health care professional if you notice any unusual bleeding. Be careful brushing and flossing your teeth or using a toothpick because you may get an infection or bleed more easily. If you have any dental work done, tell your dentist you are receiving this medicine. Avoid taking products that contain aspirin, acetaminophen, ibuprofen, naproxen, or ketoprofen unless instructed by your doctor. These medicines may hide a fever. Do not become pregnant while taking this medicine. Women should inform  their doctor if they wish to become pregnant or think they might be pregnant. There is a potential for serious side effects to an unborn child. Talk to your health care professional or pharmacist for more information. Do not breast-feed an infant while taking this medicine. Men are advised not to father a child while receiving this medicine. This product may contain alcohol. Ask your pharmacist or healthcare provider if this medicine contains alcohol. Be sure to tell all healthcare providers you are taking this medicine. Certain medicines, like metronidazole and disulfiram, can cause an unpleasant reaction when taken with alcohol. The reaction includes flushing, headache, nausea, vomiting, sweating, and increased thirst. The reaction can last from 30 minutes to several hours. What side effects may I notice from receiving this medicine? Side effects that you should report to your doctor or health care professional as soon as possible:  allergic reactions like skin rash, itching or hives, swelling of the face, lips, or tongue  breathing problems  changes in vision  fast, irregular heartbeat  high or low blood pressure  mouth sores  pain, tingling, numbness in the hands or feet  signs of  decreased platelets or bleeding - bruising, pinpoint red spots on the skin, black, tarry stools, blood in the urine  signs of decreased red blood cells - unusually weak or tired, feeling faint or lightheaded, falls  signs of infection - fever or chills, cough, sore throat, pain or difficulty passing urine  signs and symptoms of liver injury like dark yellow or brown urine; general ill feeling or flu-like symptoms; light-colored stools; loss of appetite; nausea; right upper belly pain; unusually weak or tired; yellowing of the eyes or skin  swelling of the ankles, feet, hands  unusually slow heartbeat Side effects that usually do not require medical attention (report to your doctor or health care  professional if they continue or are bothersome):  diarrhea  hair loss  loss of appetite  muscle or joint pain  nausea, vomiting  pain, redness, or irritation at site where injected  tiredness This list may not describe all possible side effects. Call your doctor for medical advice about side effects. You may report side effects to FDA at 1-800-FDA-1088. Where should I keep my medicine? This drug is given in a hospital or clinic and will not be stored at home. NOTE: This sheet is a summary. It may not cover all possible information. If you have questions about this medicine, talk to your doctor, pharmacist, or health care provider.  2020 Elsevier/Gold Standard (2017-07-09 13:14:55)

## 2020-09-08 NOTE — Progress Notes (Signed)
Z6109, A PROSPECTIVE OBSERVATIONAL COHORT STUDY TO DEVELOP A PREDICTIVE MODEL OF TAXANE-INDUCED PERIPHERAL NEUROPATHY IN CANCER PATIENTS. Baseline and 1st Taxane visit:    Patient presented to the clinic, alone, for her first treatment. Patient was met with before her appointments and was provided the study questionnaires to complete. Patient and I reviewed her medical history as well as her concomitant medications this morning. Patient denies having any discomfort or neuropathy to either her hands or feet.     PROs: Questionnaires were given to patient to complete in clinic prior to registration. Collected questionnaires and checked for completeness and accuracy.  Registration: Patient meets all eligibility requirements to be enrolled on the S1714 study.  Second eligibility confirmed by research CRC, Carol Ada. Dr. Lindi Adie confirms patient meets eligiblity and patient was registered to the S1714 study via Open and assigned patient ID [id number] Labs: Baseline mandatory research labs were collected, per protocol, along with other standard of care labs ordered by provider, including serum creatinine level. Physician Assessments: CTCAE and Treatment Burden forms reviewed and completed by Dr. Lindi Adie.  History of Falls: Per patient, no history of falls in the last 6 months. Medical and Smoking History: Reviewed with patient and CRFs completed.  Assessment for Interventions for CIPN: Reviewed with patient and CRFs completed. Neuropen Assessment: Completed per protocol by this certified research RN, time recording completed by Foye Spurling, RN. [place] Assessment: Completed per protocol by this Film/video editor, time recording completed by Foye Spurling, RN. Timed Get Up and Go Test: Completed per protocol by this trained research RN, with time documentation by Foye Spurling RN.  1st Taxane:  Drawn this afternoon at 1430 by Lenox Ponds, LPN. Reported taxane end time at 1433.  Plan: Informed  patient of next study assessments in approximately 4 weeks and will be done on same day as treatment.  Plan for this visit to be approximately four weeks time.  Patient denied having any questions at this time. Patient thanked for her time and contribution to study and was encouraged to call clinic or myself with any questions or concerns she may have prior to her next appointment. Maxwell Marion, RN, BSN, [facility] Clinical Research 09/08/2020 11:53 AM

## 2020-09-08 NOTE — Progress Notes (Signed)
Met with Toni [name] while she was receiving infusion.  Toni [name] was diagnosed with cancer on the tails of recovery from heart failure in late 2019.  She went to the ED after reporting to work at [employer] and was never able to return.  She was preparing to officially retire in August when she was diagnosed with Cancer.  She shared that this is her first infusion and reports she didn't have any preconceived notions about what it would be like though she did have some anxiety about the first port access being painful.  She was glad to report it felt like a bee sting.  Toni [name] shared that her family has been very concerned. As the middle child and only sister of four brothers, she has been overwhelmed by their support.  She reports good coping skills of setting boundaries, chunking information into manageable amounts, and distraction.  Despite some family concerns that she is pushing herself too much, she is able to name what matters in prioritizing her time and energy and acknowledges that there may be days when she needs to just do nothing and feel sad.  Humor and connection are also important tools she uses for coping and she is finding hope in looking forward to hosting a big party sometime next year to celebrate the next chapter in her life.  Toni [name] expressed gratitude for the visit and is agreeable to further chaplain visits during her infusion appointments.  Please page as further needs arise.  Donald Prose. Elyn Peers, M.Div. [facility] Chaplain Pager [phone] Office [phone]

## 2020-09-09 ENCOUNTER — Telehealth: Payer: Self-pay | Admitting: *Deleted

## 2020-09-09 ENCOUNTER — Telehealth: Payer: Self-pay | Admitting: Hematology and Oncology

## 2020-09-09 NOTE — Telephone Encounter (Signed)
No 10/21 los, no changes made to pt schedule

## 2020-09-09 NOTE — Telephone Encounter (Signed)
RN placed call to pt to see how she was feeling the day after treatment.  Pt states she was not able to sleep well last night due to receiving IV steroids prior to treatment.  Otherwise pt denies any negative symptoms.  RN educated pt on taking 25-50 mg p.o benadryl prior to bedtime to help fall asleep after chemotherapy appointments.  Pt verbalized understanding and appreciative of the advice.

## 2020-09-12 ENCOUNTER — Encounter: Payer: Self-pay | Admitting: *Deleted

## 2020-09-13 NOTE — Progress Notes (Signed)
[facility] OFFICE PROGRESS NOTE  Donald Prose, MD [place] [address]  DIAGNOSIS: Right breast cancer stage Biopsy showed invasive mammary carcinoma, grade 3, HER-2 equivocal by IHC (2+), positive by FISH, ER+ 30%, PR- 0%, Ki67 40%. T1BN0 stage Ia  Oncology History  Malignant neoplasm of lower-outer quadrant of right breast of female, estrogen receptor positive ([place])  07/01/2020 Initial Diagnosis   Screening mammogram showed a right breast mass. Mammogram and US showed a 0.7cm mass at the 6 o'clock position in the right breast, no axillary adenopathy. 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    07/06/2020 Cancer Staging   Staging form: Breast, AJCC 8th Edition - Clinical stage from 07/06/2020: Stage IB (cT1b, cN0, cM0, G3, ER+, PR-, HER2-) - Signed by Nicholas Lose, MD on 07/06/2020   07/26/2020 Genetic Testing   Negative genetic testing:  No pathogenic variants detected on the [facility] Common Hereditary Cancers Panel. The report date is 07/26/2020.   The Common Hereditary Cancers Panel offered by [facility] includes sequencing and/or deletion duplication testing of the following 48 genes: APC, ATM, AXIN2, BARD1, BMPR1A, BRCA1, BRCA2, BRIP1, CDH1, CDK4, CDKN2A (p14ARF), CDKN2A (p16INK4a), CHEK2, CTNNA1, DICER1, EPCAM (Deletion/duplication testing only), GREM1 (promoter region deletion/duplication testing only), KIT, MEN1, MLH1, MSH2, MSH3, MSH6, MUTYH, NBN, NF1, NTHL1, PALB2, PDGFRA, PMS2, POLD1, POLE, PTEN, RAD50, RAD51C, RAD51D, RNF43, SDHB, SDHC, SDHD, SMAD4, SMARCA4. STK11, TP53, TSC1, TSC2, and VHL.  The following genes were evaluated for sequence changes only: SDHA and HOXB13 c.251G>A variant only.   08/11/2020 Surgery   Right lumpectomy [name]): IDC, grade 3, 0.8cm, clear margins, 3 right axillary lymph nodes negative for carcinoma. ER 30% week, PR 0%, HER-2  positive, Ki-67 40%   08/18/2020 Cancer Staging   Staging form: Breast, AJCC 8th Edition - Pathologic stage from 08/18/2020: Stage IA (pT1b, pN0, cM0, G3, ER+, PR-, HER2-) - Signed by Nicholas Lose, MD on 08/18/2020   09/08/2020 -  Chemotherapy   The patient had PACLitaxel (TAXOL) 144 mg in sodium chloride 0.9 % 250 mL chemo infusion (</= 88m/m2), 80 mg/m2 = 144 mg, Intravenous,  Once, 1 of 3 cycles Administration: 144 mg (09/08/2020) trastuzumab-dkst (OGIVRI) 147 mg in sodium chloride 0.9 % 250 mL chemo infusion, 2 mg/kg = 147 mg (50 % of original dose 4 mg/kg), Intravenous,  Once, 1 of 16 cycles Dose modification: 2 mg/kg (original dose 4 mg/kg, Cycle 1, Reason: Provider Judgment) Administration: 147 mg (09/08/2020)  for chemotherapy treatment.      CURRENT THERAPY: Weekly taxol 80 mg/m2 and Herceptin. Status post 1 weekly dose  INTERVAL HISTORY: Toni Muckle64y.o. female returns to the clinic today for a follow-up visit.  The patient is feeling well today without any concerning complaints. The patient started her first cycle of weekly Taxol and Herceptin last week and she tolerated it well without any adverse side effects except for insomnia secondary to the steroid premedications. She states that her first cycle of treatment was "a breeze". Today the patient denies any fever, chills, or weight loss. She reports her baseline night sweats. The patient denies any shortness of breath, cough, or chest pain.  She denies any nausea, vomiting, or diarrhea. She has baseline constipation for which she is prescribed linzess. This is unchanged from before she was diagnosed. She denies any peripheral neuropathy.  She denies any rashes or skin changes.  The patient  is followed by Dr. Aundra Dubin from cardiology due to her ejection fraction at 40%.  The patient is here today for evaluation, repeat blood work, and consideration of starting her second weekly dose of treatment.  MEDICAL HISTORY: Past  Medical History:  Diagnosis Date  . AICD (automatic cardioverter/defibrillator) present   . Cancer [facility])    Right sided breast cancer  . CHF (congestive heart failure) ([place])   . Diabetes mellitus without complication ([place])   . Family history of breast cancer   . Family history of prostate cancer   . Family history of thyroid cancer   . Hypertension     ALLERGIES:  is allergic to biaxin [clarithromycin], enalapril maleate, and vasotec [enalapril].  MEDICATIONS:  Current Outpatient Medications  Medication Sig Dispense Refill  . aspirin 81 MG chewable tablet Chew 1 tablet (81 mg total) by mouth daily. 30 tablet 0  . carvedilol (COREG) 25 MG tablet TAKE 1 TABLET (25 MG TOTAL) BY MOUTH 2 (TWO) TIMES DAILY WITH A MEAL. 180 tablet 3  . cetirizine (ZYRTEC) 10 MG tablet Take 10 mg by mouth daily.     . Continuous Blood Gluc Receiver (FREESTYLE LIBRE 14 DAY READER) DEVI Apply topically as directed.    . Continuous Blood Gluc Sensor (FREESTYLE LIBRE 14 DAY SENSOR) MISC Apply topically as directed.    . digoxin (LANOXIN) 0.125 MG tablet Take 0.5 tablets (0.0625 mg total) by mouth daily. 45 tablet 1  . furosemide (LASIX) 20 MG tablet Take 20 mg by mouth daily as needed for fluid or edema.     Marland Kitchen glipiZIDE (GLUCOTROL XL) 10 MG 24 hr tablet Take 20 mg by mouth daily with breakfast.     . hydrALAZINE (APRESOLINE) 50 MG tablet TAKE 1 TABLET (50 MG TOTAL) BY MOUTH 3 (THREE) TIMES DAILY. 270 tablet 1  . Insulin Glargine (BASAGLAR KWIKPEN) 100 UNIT/ML Inject 20 Units into the skin at bedtime.     . isosorbide dinitrate (ISORDIL) 20 MG tablet TAKE 1 TABLET (20 MG TOTAL) BY MOUTH 3 TIMES DAILY. (Patient taking differently: Take 20 mg by mouth 3 (three) times daily. ) 90 tablet 11  . JARDIANCE 25 MG TABS tablet Take 25 mg by mouth daily.    Marland Kitchen lidocaine-prilocaine (EMLA) cream Apply to affected area once 30 g 3  . LINZESS 145 MCG CAPS capsule Take 145 mcg by mouth daily as needed (constipation). Constipation     . losartan (COZAAR) 25 MG tablet Take 2 tablets (50 mg total) by mouth in the morning AND 1 tablet (25 mg total) every evening. 90 tablet 3  . metFORMIN (GLUCOPHAGE) 1000 MG tablet Take 1,000 mg by mouth 2 (two) times daily with a meal.    . Multiple Vitamin (MULTIVITAMIN WITH MINERALS) TABS tablet Take 1 tablet by mouth daily. Centrum Silver    . ondansetron (ZOFRAN) 8 MG tablet Take 1 tablet (8 mg total) by mouth 2 (two) times daily as needed (Nausea or vomiting). 30 tablet 1  . OZEMPIC, 1 MG/DOSE, 4 MG/3ML SOPN Inject 1 mg into the skin every Saturday.     . prochlorperazine (COMPAZINE) 10 MG tablet Take 1 tablet (10 mg total) by mouth every 6 (six) hours as needed (Nausea or vomiting). 30 tablet 1  . simvastatin (ZOCOR) 20 MG tablet Take 20 mg by mouth at bedtime.     Marland Kitchen spironolactone (ALDACTONE) 25 MG tablet TAKE 1 TABLET BY MOUTH EVERY EVENING. (Patient taking differently: Take 25 mg by mouth at bedtime. ) 90  tablet 3  . traMADol (ULTRAM) 50 MG tablet Take 1-2 tablets (50-100 mg total) by mouth every 6 (six) hours as needed for moderate pain. 20 tablet 0  . TRUE METRIX BLOOD GLUCOSE TEST test strip 1 each by Other route as directed.   5  . TRUEPLUS LANCETS 30G MISC 1 each by Other route as directed. Use as directed.  5  . UNIFINE PENTIPS 32G X 4 MM MISC 1 each by Other route as directed.      No current facility-administered medications for this visit.    SURGICAL HISTORY:  Past Surgical History:  Procedure Laterality Date  . ABDOMINAL HYSTERECTOMY    . BREAST LUMPECTOMY WITH RADIOACTIVE SEED AND SENTINEL LYMPH NODE BIOPSY Right 08/11/2020   Procedure: RIGHT BREAST LUMPECTOMY WITH RADIOACTIVE SEED AND SENTINEL LYMPH NODE BIOPSY;  Surgeon: Coralie Keens, MD;  Location: [place];  Service: General;  Laterality: Right;  . CARDIAC CATHETERIZATION     in [facility], clean, per pt.  . ICD IMPLANT N/A 06/18/2019   Procedure: ICD IMPLANT;  Surgeon: Evans Lance, MD;  Location: [place]  CV LAB;  Service: Cardiovascular;  Laterality: N/A;  . PORTACATH PLACEMENT N/A 08/31/2020   Procedure: INSERTION PORT-A-CATH WITH ULTRASOUND GUIDANCE;  Surgeon: Coralie Keens, MD;  Location: [place];  Service: General;  Laterality: N/A;  . RIGHT/LEFT HEART CATH AND CORONARY ANGIOGRAPHY N/A 11/17/2018   Procedure: RIGHT/LEFT HEART CATH AND CORONARY ANGIOGRAPHY;  Surgeon: Belva Crome, MD;  Location: [place] CV LAB;  Service: Cardiovascular;  Laterality: N/A;    REVIEW OF SYSTEMS:   Review of Systems  Constitutional: Negative for appetite change, chills, fatigue, fever and unexpected weight change.  HENT: Negative for mouth sores, nosebleeds, sore throat and trouble swallowing.   Eyes: Negative for eye problems and icterus.  Respiratory: Negative for cough, hemoptysis, shortness of breath and wheezing.   Cardiovascular: Negative for chest pain and leg swelling.  Gastrointestinal: Negative for abdominal pain, constipation, diarrhea, nausea and vomiting.  Genitourinary: Negative for bladder incontinence, difficulty urinating, dysuria, frequency and hematuria.   Musculoskeletal: Negative for back pain, gait problem, neck pain and neck stiffness.  Skin: Negative for itching and rash.  Neurological: Negative for dizziness, extremity weakness, gait problem, headaches, light-headedness and seizures.  Hematological: Negative for adenopathy. Does not bruise/bleed easily.  Psychiatric/Behavioral: Negative for confusion, depression and sleep disturbance. The patient is not nervous/anxious.     PHYSICAL EXAMINATION:  Blood pressure 126/76, pulse 82, temperature 98.5 F (36.9 C), resp. rate 13, height _0  (1.651 m), weight 157 lb (71.2 kg), SpO2 99 %.  ECOG PERFORMANCE STATUS: 1 - Symptomatic but completely ambulatory  Physical Exam  Constitutional: Oriented to person, place, and time and well-developed, well-nourished, and in no distress.  HENT:  Head: Normocephalic and atraumatic.   Mouth/Throat: Oropharynx is clear and moist. No oropharyngeal exudate.  Eyes: Conjunctivae are normal. Right eye exhibits no discharge. Left eye exhibits no discharge. No scleral icterus.  Neck: Normal range of motion. Neck supple.  Cardiovascular: Normal rate, regular rhythm, normal heart sounds and intact distal pulses.   Pulmonary/Chest: Effort normal and breath sounds normal. No respiratory distress. No wheezes. No rales.  Abdominal: Soft. Bowel sounds are normal. Exhibits no distension and no mass. There is no tenderness.  Musculoskeletal: Normal range of motion. Exhibits no edema.  Lymphadenopathy:    No cervical adenopathy.  Neurological: Alert and oriented to person, place, and time. Exhibits normal muscle tone. Gait normal. Coordination normal.  Skin:  Skin is warm and dry. No rash noted. Not diaphoretic. No erythema. No pallor.  Psychiatric: Mood, memory and judgment normal.  Vitals reviewed.  LABORATORY DATA: Lab Results  Component Value Date   WBC 5.3 09/15/2020   HGB 12.4 09/15/2020   HCT 38.9 09/15/2020   MCV 90.0 09/15/2020   PLT 307 09/15/2020      Chemistry      Component Value Date/Time   NA 142 09/15/2020 0800   K 3.9 09/15/2020 0800   CL 107 09/15/2020 0800   CO2 27 09/15/2020 0800   BUN 13 09/15/2020 0800   CREATININE 0.82 09/15/2020 0800      Component Value Date/Time   CALCIUM 9.4 09/15/2020 0800   ALKPHOS 48 09/15/2020 0800   AST 16 09/15/2020 0800   ALT 22 09/15/2020 0800   BILITOT 0.2 (L) 09/15/2020 0800       RADIOGRAPHIC STUDIES:  DG CHEST PORT 1 VIEW  Result Date: 08/31/2020 CLINICAL DATA:  Status post port placement EXAM: PORTABLE CHEST 1 VIEW COMPARISON:  05/04/2020 FINDINGS: Cardiac shadow is stable. Defibrillator is again seen. New right-sided chest wall port is noted with catheter tip in the distal superior vena cava. No pneumothorax is noted. The loop of the catheter in the neck is not visualized on this film. IMPRESSION: No  pneumothorax following port placement. Electronically Signed   By: Inez Catalina M.D.   On: 08/31/2020 16:23   DG Fluoro Guide CV Line-No Report  Result Date: 08/31/2020 Fluoroscopy was utilized by the requesting physician.  No radiographic interpretation.   ECHOCARDIOGRAM COMPLETE  Result Date: 08/22/2020    ECHOCARDIOGRAM REPORT   Patient Name:   MARTIZA SPETH Date of Exam: 08/22/2020 Medical Rec #:  [id number]          Height:       65.0 in Accession #:    [id number]         Weight:       154.5 lb Date of Birth:  1956/01/29           BSA:          1.773 m Patient Age:    30 years           BP:           118/76 mmHg Patient Gender: F                  HR:           81 bpm. Exam Location:  Outpatient Procedure: 2D Echo, Cardiac Doppler and Color Doppler Indications:    Congestive Heart Failure 428.0 / I50.9  History:        Patient has prior history of Echocardiogram examinations, most                 recent 04/28/2019. CHF; Risk Factors:Hypertension and Diabetes.  Sonographer:    Bernadene Person RDCS Referring Phys: [place]  1. Left ventricular ejection fraction, by estimation, is 40%. The left ventricle has mild to moderately decreased function. The left ventricle demonstrates global hypokinesis. Left ventricular diastolic parameters are consistent with Grade I diastolic dysfunction (impaired relaxation).  2. Right ventricular systolic function is mildly reduced. The right ventricular size is normal. There is normal pulmonary artery systolic pressure. The estimated right ventricular systolic pressure is 38.8 mmHg.  3. The mitral valve is normal in structure. Trivial mitral valve regurgitation.  4. The aortic valve is tricuspid. Aortic valve regurgitation is not  visualized. No aortic stenosis is present.  5. The inferior vena cava is normal in size with greater than 50% respiratory variability, suggesting right atrial pressure of 3 mmHg. FINDINGS  Left Ventricle: Left ventricular  ejection fraction, by estimation, is 40%. The left ventricle has mild to moderately decreased function. The left ventricle demonstrates global hypokinesis. The left ventricular internal cavity size was normal in size. There is no left ventricular hypertrophy. Left ventricular diastolic parameters are consistent with Grade I diastolic dysfunction (impaired relaxation). Right Ventricle: The right ventricular size is normal. No increase in right ventricular wall thickness. Right ventricular systolic function is mildly reduced. There is normal pulmonary artery systolic pressure. The tricuspid regurgitant velocity is 1.90 m/s, and with an assumed right atrial pressure of 3 mmHg, the estimated right ventricular systolic pressure is 35.7 mmHg. Left Atrium: Left atrial size was normal in size. Right Atrium: Right atrial size was normal in size. Pericardium: Trivial pericardial effusion is present. Mitral Valve: The mitral valve is normal in structure. Trivial mitral valve regurgitation. Tricuspid Valve: The tricuspid valve is normal in structure. Tricuspid valve regurgitation is trivial. Aortic Valve: The aortic valve is tricuspid. Aortic valve regurgitation is not visualized. No aortic stenosis is present. Pulmonic Valve: The pulmonic valve was normal in structure. Pulmonic valve regurgitation is not visualized. Aorta: The aortic root is normal in size and structure. Venous: The inferior vena cava is normal in size with greater than 50% respiratory variability, suggesting right atrial pressure of 3 mmHg. IAS/Shunts: No atrial level shunt detected by color flow Doppler. Additional Comments: A pacer wire is visualized in the right ventricle.  LEFT VENTRICLE PLAX 2D LVIDd:         5.10 cm     Diastology LVIDs:         4.10 cm     LV e' medial:    3.24 cm/s LV PW:         1.10 cm     LV E/e' medial:  21.4 LV IVS:        0.90 cm     LV e' lateral:   3.57 cm/s LVOT diam:     2.10 cm     LV E/e' lateral: 19.4 LV SV:         52 LV  SV Index:   30 LVOT Area:     3.46 cm  LV Volumes (MOD) LV vol d, MOD A2C: 80.6 ml LV vol d, MOD A4C: 82.4 ml LV vol s, MOD A2C: 46.2 ml LV vol s, MOD A4C: 49.2 ml LV SV MOD A2C:     34.4 ml LV SV MOD A4C:     82.4 ml LV SV MOD BP:      33.5 ml RIGHT VENTRICLE RV S prime:     10.10 cm/s TAPSE (M-mode): 1.6 cm LEFT ATRIUM             Index       RIGHT ATRIUM          Index LA diam:        3.20 cm 1.81 cm/m  RA Area:     9.89 cm LA Vol (A2C):   34.4 ml 19.41 ml/m RA Volume:   18.70 ml 10.55 ml/m LA Vol (A4C):   46.7 ml 26.35 ml/m LA Biplane Vol: 43.5 ml 24.54 ml/m  AORTIC VALVE LVOT Vmax:   72.10 cm/s LVOT Vmean:  54.900 cm/s LVOT VTI:    0.151 m  AORTA Ao Root diam: 2.70  cm Ao Asc diam:  2.90 cm MITRAL VALVE                TRICUSPID VALVE MV Area (PHT): 3.60 cm     TR Peak grad:   14.4 mmHg MV Decel Time: 211 msec     TR Vmax:        190.00 cm/s MV E velocity: 69.40 cm/s MV A velocity: 102.00 cm/s  SHUNTS MV E/A ratio:  0.68         Systemic VTI:  0.15 m                             Systemic Diam: 2.10 cm Loralie Champagne MD Electronically signed by Loralie Champagne MD Signature Date/Time: 08/22/2020/4:27:21 PM    Final      ASSESSMENT/PLAN:  Malignant neoplasm of lower-outer quadrant of right breast of female, estrogen receptor positive ([place]) 07/01/2020:Screening mammogram showed a right breast mass. Mammogram and US showed a 0.7cm mass at the 6 o'clock position in the right breast, no axillary adenopathy. 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 T1BN0 stage Ia  08/11/2020:Right lumpectomy [name]): IDC, grade 3, 0.8cm, clear margins, 3 right axillary lymph nodes negative for carcinoma.ER 30% week, PR 0%, HER-2 positive, Ki-67 40%  ________________________________________________________________________________  This is a very pleasant 64 year old female with stage Ia breast cancer of the right breast.  The patient  is currently undergoing weekly Taxol and Herceptin.  She is status post her first dose and tolerated it very well without any concerning complaints.  Labs were reviewed with the patient.  Recommend that she proceed with her second weekly dose of treatment today as scheduled.  She will be seen for follow-up visit on 09/29/20 with her 4th dose of treatment; however, she knows that we would be happy to see her sooner if she develops any concerning symptoms in the interval.   The patient was advised to call immediately if she has any concerning symptoms in the interval. The patient voices understanding of current disease status and treatment options and is in agreement with the current care plan. All questions were answered. The patient knows to call the clinic with any problems, questions or concerns. We can certainly see the patient much sooner if necessary     No orders of the defined types were placed in this encounter.    Drago Hammonds L Dave Mergen, PA-C 09/15/20

## 2020-09-15 ENCOUNTER — Inpatient Hospital Stay (HOSPITAL_BASED_OUTPATIENT_CLINIC_OR_DEPARTMENT_OTHER): Payer: HMO | Admitting: Physician Assistant

## 2020-09-15 ENCOUNTER — Inpatient Hospital Stay: Payer: HMO

## 2020-09-15 ENCOUNTER — Encounter: Payer: Self-pay | Admitting: Physician Assistant

## 2020-09-15 ENCOUNTER — Other Ambulatory Visit: Payer: Self-pay

## 2020-09-15 VITALS — BP 126/76 | HR 82 | Temp 98.5°F | Resp 13 | Ht 65.0 in | Wt 157.0 lb

## 2020-09-15 DIAGNOSIS — Z5111 Encounter for antineoplastic chemotherapy: Secondary | ICD-10-CM | POA: Diagnosis not present

## 2020-09-15 DIAGNOSIS — C50511 Malignant neoplasm of lower-outer quadrant of right female breast: Secondary | ICD-10-CM

## 2020-09-15 DIAGNOSIS — Z170 Estrogen receptor positive status [ER+]: Secondary | ICD-10-CM

## 2020-09-15 DIAGNOSIS — E1169 Type 2 diabetes mellitus with other specified complication: Secondary | ICD-10-CM | POA: Diagnosis not present

## 2020-09-15 DIAGNOSIS — I509 Heart failure, unspecified: Secondary | ICD-10-CM | POA: Diagnosis not present

## 2020-09-15 DIAGNOSIS — E785 Hyperlipidemia, unspecified: Secondary | ICD-10-CM | POA: Diagnosis not present

## 2020-09-15 DIAGNOSIS — Z5112 Encounter for antineoplastic immunotherapy: Secondary | ICD-10-CM | POA: Diagnosis not present

## 2020-09-15 DIAGNOSIS — I10 Essential (primary) hypertension: Secondary | ICD-10-CM | POA: Diagnosis not present

## 2020-09-15 LAB — CMP (CANCER CENTER ONLY)
ALT: 22 U/L (ref 0–44)
AST: 16 U/L (ref 15–41)
Albumin: 3.5 g/dL (ref 3.5–5.0)
Alkaline Phosphatase: 48 U/L (ref 38–126)
Anion gap: 8 (ref 5–15)
BUN: 13 mg/dL (ref 8–23)
CO2: 27 mmol/L (ref 22–32)
Calcium: 9.4 mg/dL (ref 8.9–10.3)
Chloride: 107 mmol/L (ref 98–111)
Creatinine: 0.82 mg/dL (ref 0.44–1.00)
GFR, Estimated: >60 mL/min (ref >60)
Glucose, Bld: 148 mg/dL — ABNORMAL HIGH (ref 70–99)
Potassium: 3.9 mmol/L (ref 3.5–5.1)
Sodium: 142 mmol/L (ref 135–145)
Total Bilirubin: 0.2 mg/dL — ABNORMAL LOW (ref 0.3–1.2)
Total Protein: 7 g/dL (ref 6.5–8.1)

## 2020-09-15 LAB — CBC WITH DIFFERENTIAL (CANCER CENTER ONLY)
Abs Immature Granulocytes: 0.02 10*3/uL (ref 0.00–0.07)
Basophils Absolute: 0 10*3/uL (ref 0.0–0.1)
Basophils Relative: 0 %
Eosinophils Absolute: 0.2 10*3/uL (ref 0.0–0.5)
Eosinophils Relative: 3 %
HCT: 38.9 % (ref 36.0–46.0)
Hemoglobin: 12.4 g/dL (ref 12.0–15.0)
Immature Granulocytes: 0 %
Lymphocytes Relative: 34 %
Lymphs Abs: 1.8 10*3/uL (ref 0.7–4.0)
MCH: 28.7 pg (ref 26.0–34.0)
MCHC: 31.9 g/dL (ref 30.0–36.0)
MCV: 90 fL (ref 80.0–100.0)
Monocytes Absolute: 0.3 10*3/uL (ref 0.1–1.0)
Monocytes Relative: 5 %
Neutro Abs: 3.1 10*3/uL (ref 1.7–7.7)
Neutrophils Relative %: 58 %
Platelet Count: 307 10*3/uL (ref 150–400)
RBC: 4.32 MIL/uL (ref 3.87–5.11)
RDW: 12.8 % (ref 11.5–15.5)
WBC Count: 5.3 10*3/uL (ref 4.0–10.5)
nRBC: 0 % (ref 0.0–0.2)

## 2020-09-15 MED ORDER — ACETAMINOPHEN 325 MG PO TABS
650.0000 mg | ORAL_TABLET | Freq: Once | ORAL | Status: AC
  Administered 2020-09-15: 650 mg via ORAL

## 2020-09-15 MED ORDER — FAMOTIDINE IN NACL 20-0.9 MG/50ML-% IV SOLN
20.0000 mg | Freq: Once | INTRAVENOUS | Status: AC
  Administered 2020-09-15: 20 mg via INTRAVENOUS

## 2020-09-15 MED ORDER — DIPHENHYDRAMINE HCL 50 MG/ML IJ SOLN
25.0000 mg | Freq: Once | INTRAMUSCULAR | Status: AC
  Administered 2020-09-15: 25 mg via INTRAVENOUS

## 2020-09-15 MED ORDER — SODIUM CHLORIDE 0.9 % IV SOLN
Freq: Once | INTRAVENOUS | Status: AC
  Filled 2020-09-15: qty 250

## 2020-09-15 MED ORDER — SODIUM CHLORIDE 0.9 % IV SOLN
10.0000 mg | Freq: Once | INTRAVENOUS | Status: AC
  Administered 2020-09-15: 10 mg via INTRAVENOUS
  Filled 2020-09-15: qty 10

## 2020-09-15 MED ORDER — SODIUM CHLORIDE 0.9 % IV SOLN
80.0000 mg/m2 | Freq: Once | INTRAVENOUS | Status: AC
  Administered 2020-09-15: 144 mg via INTRAVENOUS
  Filled 2020-09-15: qty 24

## 2020-09-15 MED ORDER — DIPHENHYDRAMINE HCL 50 MG/ML IJ SOLN
INTRAMUSCULAR | Status: AC
  Filled 2020-09-15: qty 1

## 2020-09-15 MED ORDER — FAMOTIDINE IN NACL 20-0.9 MG/50ML-% IV SOLN
INTRAVENOUS | Status: AC
  Filled 2020-09-15: qty 50

## 2020-09-15 MED ORDER — ACETAMINOPHEN 325 MG PO TABS
ORAL_TABLET | ORAL | Status: AC
  Filled 2020-09-15: qty 2

## 2020-09-15 MED ORDER — SODIUM CHLORIDE 0.9% FLUSH
10.0000 mL | INTRAVENOUS | Status: DC | PRN
  Administered 2020-09-15: 10 mL
  Filled 2020-09-15: qty 10

## 2020-09-15 MED ORDER — TRASTUZUMAB-DKST CHEMO 150 MG IV SOLR
150.0000 mg | Freq: Once | INTRAVENOUS | Status: AC
  Administered 2020-09-15: 150 mg via INTRAVENOUS
  Filled 2020-09-15: qty 7.14

## 2020-09-15 MED ORDER — HEPARIN SOD (PORK) LOCK FLUSH 100 UNIT/ML IV SOLN
500.0000 [IU] | Freq: Once | INTRAVENOUS | Status: AC | PRN
  Administered 2020-09-15: 500 [IU]
  Filled 2020-09-15: qty 5

## 2020-09-15 NOTE — Progress Notes (Signed)
Met with patient to obtain signature for grant.  Patient approved for one-time $1000 [employer] grant to assist with personal expenses while going through treatment. Gave her a copy of the expense sheet and approval letter along with the Outpatient pharmacy information. She received a gift card today from grant.  She has my card for any additional financial questions or concerns.

## 2020-09-15 NOTE — Patient Instructions (Signed)
[facility] Discharge Instructions for Patients Receiving Chemotherapy  Today you received the following chemotherapy agents: trastuzumab/paclitaxel.  To help prevent nausea and vomiting after your treatment, we encourage you to take your nausea medication as directed.   If you develop nausea and vomiting that is not controlled by your nausea medication, call the clinic.   BELOW ARE SYMPTOMS THAT SHOULD BE REPORTED IMMEDIATELY:  *FEVER GREATER THAN 100.5 F  *CHILLS WITH OR WITHOUT FEVER  NAUSEA AND VOMITING THAT IS NOT CONTROLLED WITH YOUR NAUSEA MEDICATION  *UNUSUAL SHORTNESS OF BREATH  *UNUSUAL BRUISING OR BLEEDING  TENDERNESS IN MOUTH AND THROAT WITH OR WITHOUT PRESENCE OF ULCERS  *URINARY PROBLEMS  *BOWEL PROBLEMS  UNUSUAL RASH Items with * indicate a potential emergency and should be followed up as soon as possible.  Feel free to call the clinic should you have any questions or concerns. The clinic phone number is [phone].  Please show the CHEMO ALERT CARD at check-in to the Emergency Department and triage nurse.   

## 2020-09-22 ENCOUNTER — Inpatient Hospital Stay: Payer: HMO | Attending: Hematology and Oncology

## 2020-09-22 ENCOUNTER — Inpatient Hospital Stay: Payer: HMO

## 2020-09-22 ENCOUNTER — Other Ambulatory Visit: Payer: Self-pay

## 2020-09-22 VITALS — BP 142/77 | HR 84 | Temp 98.7°F | Resp 18 | Wt 156.4 lb

## 2020-09-22 DIAGNOSIS — I5023 Acute on chronic systolic (congestive) heart failure: Secondary | ICD-10-CM | POA: Diagnosis not present

## 2020-09-22 DIAGNOSIS — I428 Other cardiomyopathies: Secondary | ICD-10-CM | POA: Diagnosis not present

## 2020-09-22 DIAGNOSIS — C50511 Malignant neoplasm of lower-outer quadrant of right female breast: Secondary | ICD-10-CM

## 2020-09-22 DIAGNOSIS — E785 Hyperlipidemia, unspecified: Secondary | ICD-10-CM | POA: Insufficient documentation

## 2020-09-22 DIAGNOSIS — Z170 Estrogen receptor positive status [ER+]: Secondary | ICD-10-CM

## 2020-09-22 DIAGNOSIS — I10 Essential (primary) hypertension: Secondary | ICD-10-CM | POA: Diagnosis not present

## 2020-09-22 DIAGNOSIS — Z79899 Other long term (current) drug therapy: Secondary | ICD-10-CM | POA: Insufficient documentation

## 2020-09-22 DIAGNOSIS — E119 Type 2 diabetes mellitus without complications: Secondary | ICD-10-CM | POA: Insufficient documentation

## 2020-09-22 DIAGNOSIS — Z95810 Presence of automatic (implantable) cardiac defibrillator: Secondary | ICD-10-CM | POA: Insufficient documentation

## 2020-09-22 DIAGNOSIS — I2510 Atherosclerotic heart disease of native coronary artery without angina pectoris: Secondary | ICD-10-CM | POA: Insufficient documentation

## 2020-09-22 DIAGNOSIS — Z5111 Encounter for antineoplastic chemotherapy: Secondary | ICD-10-CM | POA: Diagnosis not present

## 2020-09-22 DIAGNOSIS — Z7984 Long term (current) use of oral hypoglycemic drugs: Secondary | ICD-10-CM | POA: Diagnosis not present

## 2020-09-22 DIAGNOSIS — Z95828 Presence of other vascular implants and grafts: Secondary | ICD-10-CM

## 2020-09-22 LAB — CBC WITH DIFFERENTIAL (CANCER CENTER ONLY)
Abs Immature Granulocytes: 0.03 10*3/uL (ref 0.00–0.07)
Basophils Absolute: 0 10*3/uL (ref 0.0–0.1)
Basophils Relative: 1 %
Eosinophils Absolute: 0.1 10*3/uL (ref 0.0–0.5)
Eosinophils Relative: 2 %
HCT: 38.8 % (ref 36.0–46.0)
Hemoglobin: 12.4 g/dL (ref 12.0–15.0)
Immature Granulocytes: 1 %
Lymphocytes Relative: 31 %
Lymphs Abs: 1.3 10*3/uL (ref 0.7–4.0)
MCH: 29 pg (ref 26.0–34.0)
MCHC: 32 g/dL (ref 30.0–36.0)
MCV: 90.7 fL (ref 80.0–100.0)
Monocytes Absolute: 0.3 10*3/uL (ref 0.1–1.0)
Monocytes Relative: 7 %
Neutro Abs: 2.4 10*3/uL (ref 1.7–7.7)
Neutrophils Relative %: 58 %
Platelet Count: 354 10*3/uL (ref 150–400)
RBC: 4.28 MIL/uL (ref 3.87–5.11)
RDW: 13.3 % (ref 11.5–15.5)
WBC Count: 4.2 10*3/uL (ref 4.0–10.5)
nRBC: 0 % (ref 0.0–0.2)

## 2020-09-22 LAB — CMP (CANCER CENTER ONLY)
ALT: 18 U/L (ref 0–44)
AST: 17 U/L (ref 15–41)
Albumin: 3.4 g/dL — ABNORMAL LOW (ref 3.5–5.0)
Alkaline Phosphatase: 53 U/L (ref 38–126)
Anion gap: 6 (ref 5–15)
BUN: 12 mg/dL (ref 8–23)
CO2: 28 mmol/L (ref 22–32)
Calcium: 9.3 mg/dL (ref 8.9–10.3)
Chloride: 106 mmol/L (ref 98–111)
Creatinine: 0.68 mg/dL (ref 0.44–1.00)
GFR, Estimated: >60 mL/min (ref >60)
Glucose, Bld: 108 mg/dL — ABNORMAL HIGH (ref 70–99)
Potassium: 3.8 mmol/L (ref 3.5–5.1)
Sodium: 140 mmol/L (ref 135–145)
Total Bilirubin: 0.4 mg/dL (ref 0.3–1.2)
Total Protein: 6.9 g/dL (ref 6.5–8.1)

## 2020-09-22 MED ORDER — DIPHENHYDRAMINE HCL 50 MG/ML IJ SOLN
25.0000 mg | Freq: Once | INTRAMUSCULAR | Status: AC
  Administered 2020-09-22: 25 mg via INTRAVENOUS

## 2020-09-22 MED ORDER — HEPARIN SOD (PORK) LOCK FLUSH 100 UNIT/ML IV SOLN
500.0000 [IU] | Freq: Once | INTRAVENOUS | Status: AC | PRN
  Administered 2020-09-22: 500 [IU]
  Filled 2020-09-22: qty 5

## 2020-09-22 MED ORDER — SODIUM CHLORIDE 0.9 % IV SOLN
Freq: Once | INTRAVENOUS | Status: AC
  Filled 2020-09-22: qty 250

## 2020-09-22 MED ORDER — ACETAMINOPHEN 325 MG PO TABS
650.0000 mg | ORAL_TABLET | Freq: Once | ORAL | Status: AC
  Administered 2020-09-22: 650 mg via ORAL

## 2020-09-22 MED ORDER — DIPHENHYDRAMINE HCL 50 MG/ML IJ SOLN
INTRAMUSCULAR | Status: AC
  Filled 2020-09-22: qty 1

## 2020-09-22 MED ORDER — SODIUM CHLORIDE 0.9% FLUSH
10.0000 mL | Freq: Once | INTRAVENOUS | Status: AC
  Administered 2020-09-22: 10 mL
  Filled 2020-09-22: qty 10

## 2020-09-22 MED ORDER — SODIUM CHLORIDE 0.9% FLUSH
10.0000 mL | INTRAVENOUS | Status: DC | PRN
  Administered 2020-09-22: 10 mL
  Filled 2020-09-22: qty 10

## 2020-09-22 MED ORDER — ACETAMINOPHEN 325 MG PO TABS
ORAL_TABLET | ORAL | Status: AC
  Filled 2020-09-22: qty 2

## 2020-09-22 MED ORDER — FAMOTIDINE IN NACL 20-0.9 MG/50ML-% IV SOLN
INTRAVENOUS | Status: AC
  Filled 2020-09-22: qty 50

## 2020-09-22 MED ORDER — SODIUM CHLORIDE 0.9 % IV SOLN
80.0000 mg/m2 | Freq: Once | INTRAVENOUS | Status: AC
  Administered 2020-09-22: 144 mg via INTRAVENOUS
  Filled 2020-09-22: qty 24

## 2020-09-22 MED ORDER — SODIUM CHLORIDE 0.9 % IV SOLN
10.0000 mg | Freq: Once | INTRAVENOUS | Status: AC
  Administered 2020-09-22: 10 mg via INTRAVENOUS
  Filled 2020-09-22: qty 10

## 2020-09-22 MED ORDER — FAMOTIDINE IN NACL 20-0.9 MG/50ML-% IV SOLN
20.0000 mg | Freq: Once | INTRAVENOUS | Status: AC
  Administered 2020-09-22: 20 mg via INTRAVENOUS

## 2020-09-22 MED ORDER — TRASTUZUMAB-DKST CHEMO 150 MG IV SOLR
150.0000 mg | Freq: Once | INTRAVENOUS | Status: AC
  Administered 2020-09-22: 150 mg via INTRAVENOUS
  Filled 2020-09-22: qty 7.14

## 2020-09-22 NOTE — Patient Instructions (Signed)

## 2020-09-22 NOTE — Patient Instructions (Signed)
[facility] Discharge Instructions for Patients Receiving Chemotherapy  Today you received the following chemotherapy agents Ogivri and Taxol  To help prevent nausea and vomiting after your treatment, we encourage you to take your nausea medication as directed.  If you develop nausea and vomiting that is not controlled by your nausea medication, call the clinic.   BELOW ARE SYMPTOMS THAT SHOULD BE REPORTED IMMEDIATELY:  *FEVER GREATER THAN 100.5 F  *CHILLS WITH OR WITHOUT FEVER  NAUSEA AND VOMITING THAT IS NOT CONTROLLED WITH YOUR NAUSEA MEDICATION  *UNUSUAL SHORTNESS OF BREATH  *UNUSUAL BRUISING OR BLEEDING  TENDERNESS IN MOUTH AND THROAT WITH OR WITHOUT PRESENCE OF ULCERS  *URINARY PROBLEMS  *BOWEL PROBLEMS  UNUSUAL RASH Items with * indicate a potential emergency and should be followed up as soon as possible.  Feel free to call the clinic should you have any questions or concerns. The clinic phone number is [phone].  Please show the [place] at check-in to the Emergency Department and triage nurse.

## 2020-09-27 ENCOUNTER — Other Ambulatory Visit (HOSPITAL_COMMUNITY): Payer: No Typology Code available for payment source

## 2020-09-27 ENCOUNTER — Encounter (HOSPITAL_COMMUNITY): Payer: No Typology Code available for payment source | Admitting: Cardiology

## 2020-09-28 ENCOUNTER — Ambulatory Visit (INDEPENDENT_AMBULATORY_CARE_PROVIDER_SITE_OTHER): Payer: HMO

## 2020-09-28 ENCOUNTER — Encounter: Payer: Self-pay | Admitting: *Deleted

## 2020-09-28 DIAGNOSIS — I428 Other cardiomyopathies: Secondary | ICD-10-CM

## 2020-09-28 NOTE — Progress Notes (Signed)
Patient Care Team: Donald Prose, MD as PCP - General (Family Medicine) Belva Crome, MD as PCP - Cardiology (Cardiology) Larey Dresser, MD as PCP - Advanced Heart Failure (Cardiology) Dannielle Karvonen, RN as [place] Management Tressie Ellis, Paulette Blanch, RN as Registered Nurse Rockwell Germany, RN as Oncology Nurse Navigator Coralie Keens, MD as Consulting Physician (General Surgery) Nicholas Lose, MD as Consulting Physician (Hematology and Oncology) Kyung Rudd, MD as Consulting Physician (Radiation Oncology)  DIAGNOSIS:    ICD-10-CM   1. Malignant neoplasm of lower-outer quadrant of right breast of female, estrogen receptor positive ([place])  C50.511    Z17.0     SUMMARY OF ONCOLOGIC HISTORY: Oncology History  Malignant neoplasm of lower-outer quadrant of right breast of female, estrogen receptor positive ([place])  07/01/2020 Initial Diagnosis   Screening mammogram showed a right breast mass. Mammogram and US showed a 0.7cm mass at the 6 o'clock position in the right breast, no axillary adenopathy. 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    07/06/2020 Cancer Staging   Staging form: Breast, AJCC 8th Edition - Clinical stage from 07/06/2020: Stage IB (cT1b, cN0, cM0, G3, ER+, PR-, HER2-) - Signed by Nicholas Lose, MD on 07/06/2020   07/26/2020 Genetic Testing   Negative genetic testing:  No pathogenic variants detected on the [facility] Common Hereditary Cancers Panel. The report date is 07/26/2020.   The Common Hereditary Cancers Panel offered by [facility] includes sequencing and/or deletion duplication testing of the following 48 genes: APC, ATM, AXIN2, BARD1, BMPR1A, BRCA1, BRCA2, BRIP1, CDH1, CDK4, CDKN2A (p14ARF), CDKN2A (p16INK4a), CHEK2, CTNNA1, DICER1, EPCAM (Deletion/duplication testing only), GREM1 (promoter region deletion/duplication testing only), KIT, MEN1, MLH1, MSH2, MSH3, MSH6,  MUTYH, NBN, NF1, NTHL1, PALB2, PDGFRA, PMS2, POLD1, POLE, PTEN, RAD50, RAD51C, RAD51D, RNF43, SDHB, SDHC, SDHD, SMAD4, SMARCA4. STK11, TP53, TSC1, TSC2, and VHL.  The following genes were evaluated for sequence changes only: SDHA and HOXB13 c.251G>A variant only.   08/11/2020 Surgery   Right lumpectomy [name]): IDC, grade 3, 0.8cm, clear margins, 3 right axillary lymph nodes negative for carcinoma. ER 30% week, PR 0%, HER-2 positive, Ki-67 40%   08/18/2020 Cancer Staging   Staging form: Breast, AJCC 8th Edition - Pathologic stage from 08/18/2020: Stage IA (pT1b, pN0, cM0, G3, ER+, PR-, HER2-) - Signed by Nicholas Lose, MD on 08/18/2020   09/08/2020 -  Chemotherapy   The patient had PACLitaxel (TAXOL) 144 mg in sodium chloride 0.9 % 250 mL chemo infusion (</= 12m/m2), 80 mg/m2 = 144 mg, Intravenous,  Once, 1 of 3 cycles Administration: 144 mg (09/08/2020), 144 mg (09/15/2020), 144 mg (09/22/2020) trastuzumab-dkst (OGIVRI) 147 mg in sodium chloride 0.9 % 250 mL chemo infusion, 2 mg/kg = 147 mg (50 % of original dose 4 mg/kg), Intravenous,  Once, 1 of 16 cycles Dose modification: 2 mg/kg (original dose 4 mg/kg, Cycle 1, Reason: Provider Judgment) Administration: 147 mg (09/08/2020), 150 mg (09/15/2020), 150 mg (09/22/2020)  for chemotherapy treatment.      CHIEF COMPLIANT: Cycle 4 Taxol Herceptin  INTERVAL HISTORY: Toni [name] a 64y.o. with above-mentioned history of right breast cancer who underwent a right lumpectomy and is currently on adjuvant chemotherapy with Taxol Herceptin. She presents to the clinic today for cycle 4.  She is tolerating chemo extremely well without any adverse effects. Denies any nausea vomiting. Denies any diarrhea or constipation. She has mild tingling on the  little fingers but it is not bothering her significantly.  ALLERGIES:  is allergic to biaxin [clarithromycin], enalapril maleate, and vasotec [enalapril].  MEDICATIONS:  Current Outpatient Medications    Medication Sig Dispense Refill  . aspirin 81 MG chewable tablet Chew 1 tablet (81 mg total) by mouth daily. 30 tablet 0  . carvedilol (COREG) 25 MG tablet TAKE 1 TABLET (25 MG TOTAL) BY MOUTH 2 (TWO) TIMES DAILY WITH A MEAL. 180 tablet 3  . cetirizine (ZYRTEC) 10 MG tablet Take 10 mg by mouth daily.     . Continuous Blood Gluc Receiver (FREESTYLE LIBRE 14 DAY READER) DEVI Apply topically as directed.    . Continuous Blood Gluc Sensor (FREESTYLE LIBRE 14 DAY SENSOR) MISC Apply topically as directed.    . digoxin (LANOXIN) 0.125 MG tablet Take 0.5 tablets (0.0625 mg total) by mouth daily. 45 tablet 1  . furosemide (LASIX) 20 MG tablet Take 20 mg by mouth daily as needed for fluid or edema.     Marland Kitchen glipiZIDE (GLUCOTROL XL) 10 MG 24 hr tablet Take 20 mg by mouth daily with breakfast.     . hydrALAZINE (APRESOLINE) 50 MG tablet TAKE 1 TABLET (50 MG TOTAL) BY MOUTH 3 (THREE) TIMES DAILY. 270 tablet 1  . Insulin Glargine (BASAGLAR KWIKPEN) 100 UNIT/ML Inject 20 Units into the skin at bedtime.     . isosorbide dinitrate (ISORDIL) 20 MG tablet TAKE 1 TABLET (20 MG TOTAL) BY MOUTH 3 TIMES DAILY. (Patient taking differently: Take 20 mg by mouth 3 (three) times daily. ) 90 tablet 11  . JARDIANCE 25 MG TABS tablet Take 25 mg by mouth daily.    Marland Kitchen lidocaine-prilocaine (EMLA) cream Apply to affected area once 30 g 3  . LINZESS 145 MCG CAPS capsule Take 145 mcg by mouth daily as needed (constipation). Constipation    . losartan (COZAAR) 25 MG tablet Take 2 tablets (50 mg total) by mouth in the morning AND 1 tablet (25 mg total) every evening. 90 tablet 3  . metFORMIN (GLUCOPHAGE) 1000 MG tablet Take 1,000 mg by mouth 2 (two) times daily with a meal.    . Multiple Vitamin (MULTIVITAMIN WITH MINERALS) TABS tablet Take 1 tablet by mouth daily. Centrum Silver    . ondansetron (ZOFRAN) 8 MG tablet Take 1 tablet (8 mg total) by mouth 2 (two) times daily as needed (Nausea or vomiting). 30 tablet 1  . OZEMPIC, 1 MG/DOSE,  4 MG/3ML SOPN Inject 1 mg into the skin every Saturday.     . prochlorperazine (COMPAZINE) 10 MG tablet Take 1 tablet (10 mg total) by mouth every 6 (six) hours as needed (Nausea or vomiting). 30 tablet 1  . simvastatin (ZOCOR) 20 MG tablet Take 20 mg by mouth at bedtime.     Marland Kitchen spironolactone (ALDACTONE) 25 MG tablet TAKE 1 TABLET BY MOUTH EVERY EVENING. (Patient taking differently: Take 25 mg by mouth at bedtime. ) 90 tablet 3  . traMADol (ULTRAM) 50 MG tablet Take 1-2 tablets (50-100 mg total) by mouth every 6 (six) hours as needed for moderate pain. 20 tablet 0  . TRUE METRIX BLOOD GLUCOSE TEST test strip 1 each by Other route as directed.   5  . TRUEPLUS LANCETS 30G MISC 1 each by Other route as directed. Use as directed.  5  . UNIFINE PENTIPS 32G X 4 MM MISC 1 each by Other route as directed.      No current facility-administered medications for this visit.    PHYSICAL  EXAMINATION: ECOG PERFORMANCE STATUS: 1 - Symptomatic but completely ambulatory  Vitals:   09/29/20 1037  BP: 108/70  Pulse: 87  Resp: 17  Temp: (!) 97 F (36.1 C)  SpO2: 97%   Filed Weights   09/29/20 1037  Weight: 154 lb 9.6 oz (70.1 kg)     LABORATORY DATA:  I have reviewed the data as listed CMP Latest Ref Rng & Units 09/29/2020 09/22/2020 09/15/2020  Glucose 70 - 99 mg/dL 173(H) 108(H) 148(H)  BUN 8 - 23 mg/dL 16 12 13   Creatinine 0.44 - 1.00 mg/dL 0.80 0.68 0.82  Sodium 135 - 145 mmol/L 140 140 142  Potassium 3.5 - 5.1 mmol/L 3.9 3.8 3.9  Chloride 98 - 111 mmol/L 106 106 107  CO2 22 - 32 mmol/L 27 28 27   Calcium 8.9 - 10.3 mg/dL 9.3 9.3 9.4  Total Protein 6.5 - 8.1 g/dL 7.1 6.9 7.0  Total Bilirubin 0.3 - 1.2 mg/dL 0.4 0.4 0.2(L)  Alkaline Phos 38 - 126 U/L 55 53 48  AST 15 - 41 U/L 17 17 16   ALT 0 - 44 U/L 16 18 22     Lab Results  Component Value Date   WBC 4.9 09/29/2020   HGB 13.2 09/29/2020   HCT 40.7 09/29/2020   MCV 91.1 09/29/2020   PLT 339 09/29/2020   NEUTROABS 2.8 09/29/2020     ASSESSMENT & PLAN:  Malignant neoplasm of lower-outer quadrant of right breast of female, estrogen receptor positive ([place]) 07/01/2020:Screening mammogram showed a right breast mass. Mammogram and US showed a 0.7cm mass at the 6 o'clock position in the right breast, no axillary adenopathy. 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 T1BN0 stage Ia  08/11/2020:Right lumpectomy [name]): IDC, grade 3, 0.8cm, clear margins, 3 right axillary lymph nodes negative for carcinoma.ER 30% week, PR 0%, HER-2 positive, Ki-67 40%  Treatment plan: 1.adjuvant Taxol Herceptin 2.Adjuvant radiation therapy 3.Follow-up adjuvant antiestrogen therapy Patient is participating in the neuropathy clinical trial SWOG S1714 --------------------------------------------------------------------------------------------------------------------------------------- Current treatment: Cycle 2 day 1 Taxol Herceptin Echocardiogram 08/22/2020: EF 40% Next echocardiogram scheduled for 10/07/2020.  Chemo toxicities: 1. Mild tingling of the little fingers: Stable  Further decisions regarding Herceptin will be taken after that echocardiogram. Return to clinic weekly for Taxol and Herceptin I will see her back in 2 weeks.  No orders of the defined types were placed in this encounter.  The patient has a good understanding of the overall plan. she agrees with it. she will call with any problems that may develop before the next visit here.  Total time spent: 30 mins including face to face time and time spent for planning, charting and coordination of care  Nicholas Lose, MD 09/29/2020  I, Cloyde Reams Dorshimer, am acting as scribe for Dr. Nicholas Lose.  I have reviewed the above documentation for accuracy and completeness, and I agree with the above.

## 2020-09-29 ENCOUNTER — Inpatient Hospital Stay: Payer: HMO

## 2020-09-29 ENCOUNTER — Inpatient Hospital Stay: Payer: HMO | Admitting: Hematology and Oncology

## 2020-09-29 ENCOUNTER — Other Ambulatory Visit: Payer: Self-pay

## 2020-09-29 DIAGNOSIS — C50511 Malignant neoplasm of lower-outer quadrant of right female breast: Secondary | ICD-10-CM

## 2020-09-29 DIAGNOSIS — Z5111 Encounter for antineoplastic chemotherapy: Secondary | ICD-10-CM | POA: Diagnosis not present

## 2020-09-29 DIAGNOSIS — Z170 Estrogen receptor positive status [ER+]: Secondary | ICD-10-CM

## 2020-09-29 DIAGNOSIS — Z95828 Presence of other vascular implants and grafts: Secondary | ICD-10-CM

## 2020-09-29 LAB — CBC WITH DIFFERENTIAL (CANCER CENTER ONLY)
Abs Immature Granulocytes: 0.02 10*3/uL (ref 0.00–0.07)
Basophils Absolute: 0 10*3/uL (ref 0.0–0.1)
Basophils Relative: 1 %
Eosinophils Absolute: 0.1 10*3/uL (ref 0.0–0.5)
Eosinophils Relative: 2 %
HCT: 40.7 % (ref 36.0–46.0)
Hemoglobin: 13.2 g/dL (ref 12.0–15.0)
Immature Granulocytes: 0 %
Lymphocytes Relative: 33 %
Lymphs Abs: 1.6 10*3/uL (ref 0.7–4.0)
MCH: 29.5 pg (ref 26.0–34.0)
MCHC: 32.4 g/dL (ref 30.0–36.0)
MCV: 91.1 fL (ref 80.0–100.0)
Monocytes Absolute: 0.3 10*3/uL (ref 0.1–1.0)
Monocytes Relative: 6 %
Neutro Abs: 2.8 10*3/uL (ref 1.7–7.7)
Neutrophils Relative %: 58 %
Platelet Count: 339 10*3/uL (ref 150–400)
RBC: 4.47 MIL/uL (ref 3.87–5.11)
RDW: 13.3 % (ref 11.5–15.5)
WBC Count: 4.9 10*3/uL (ref 4.0–10.5)
nRBC: 0 % (ref 0.0–0.2)

## 2020-09-29 LAB — CMP (CANCER CENTER ONLY)
ALT: 16 U/L (ref 0–44)
AST: 17 U/L (ref 15–41)
Albumin: 3.5 g/dL (ref 3.5–5.0)
Alkaline Phosphatase: 55 U/L (ref 38–126)
Anion gap: 7 (ref 5–15)
BUN: 16 mg/dL (ref 8–23)
CO2: 27 mmol/L (ref 22–32)
Calcium: 9.3 mg/dL (ref 8.9–10.3)
Chloride: 106 mmol/L (ref 98–111)
Creatinine: 0.8 mg/dL (ref 0.44–1.00)
GFR, Estimated: >60 mL/min (ref >60)
Glucose, Bld: 173 mg/dL — ABNORMAL HIGH (ref 70–99)
Potassium: 3.9 mmol/L (ref 3.5–5.1)
Sodium: 140 mmol/L (ref 135–145)
Total Bilirubin: 0.4 mg/dL (ref 0.3–1.2)
Total Protein: 7.1 g/dL (ref 6.5–8.1)

## 2020-09-29 MED ORDER — ACETAMINOPHEN 325 MG PO TABS
ORAL_TABLET | ORAL | Status: AC
  Filled 2020-09-29: qty 2

## 2020-09-29 MED ORDER — SODIUM CHLORIDE 0.9 % IV SOLN
Freq: Once | INTRAVENOUS | Status: AC
  Filled 2020-09-29: qty 250

## 2020-09-29 MED ORDER — SODIUM CHLORIDE 0.9 % IV SOLN
10.0000 mg | Freq: Once | INTRAVENOUS | Status: AC
  Administered 2020-09-29: 10 mg via INTRAVENOUS
  Filled 2020-09-29: qty 10

## 2020-09-29 MED ORDER — FAMOTIDINE IN NACL 20-0.9 MG/50ML-% IV SOLN
20.0000 mg | Freq: Once | INTRAVENOUS | Status: AC
  Administered 2020-09-29: 20 mg via INTRAVENOUS

## 2020-09-29 MED ORDER — DIPHENHYDRAMINE HCL 50 MG/ML IJ SOLN
25.0000 mg | Freq: Once | INTRAMUSCULAR | Status: AC
  Administered 2020-09-29: 25 mg via INTRAVENOUS

## 2020-09-29 MED ORDER — SODIUM CHLORIDE 0.9 % IV SOLN
80.0000 mg/m2 | Freq: Once | INTRAVENOUS | Status: AC
  Administered 2020-09-29: 144 mg via INTRAVENOUS
  Filled 2020-09-29: qty 24

## 2020-09-29 MED ORDER — FAMOTIDINE IN NACL 20-0.9 MG/50ML-% IV SOLN
INTRAVENOUS | Status: AC
  Filled 2020-09-29: qty 50

## 2020-09-29 MED ORDER — ACETAMINOPHEN 325 MG PO TABS
650.0000 mg | ORAL_TABLET | Freq: Once | ORAL | Status: AC
  Administered 2020-09-29: 650 mg via ORAL

## 2020-09-29 MED ORDER — SODIUM CHLORIDE 0.9% FLUSH
10.0000 mL | INTRAVENOUS | Status: DC | PRN
  Administered 2020-09-29: 10 mL
  Filled 2020-09-29: qty 10

## 2020-09-29 MED ORDER — DIPHENHYDRAMINE HCL 50 MG/ML IJ SOLN
INTRAMUSCULAR | Status: AC
  Filled 2020-09-29: qty 1

## 2020-09-29 MED ORDER — HEPARIN SOD (PORK) LOCK FLUSH 100 UNIT/ML IV SOLN
500.0000 [IU] | Freq: Once | INTRAVENOUS | Status: AC | PRN
  Administered 2020-09-29: 500 [IU]
  Filled 2020-09-29: qty 5

## 2020-09-29 MED ORDER — SODIUM CHLORIDE 0.9% FLUSH
10.0000 mL | Freq: Once | INTRAVENOUS | Status: AC
  Administered 2020-09-29: 10 mL
  Filled 2020-09-29: qty 10

## 2020-09-29 MED ORDER — TRASTUZUMAB-DKST CHEMO 150 MG IV SOLR
150.0000 mg | Freq: Once | INTRAVENOUS | Status: AC
  Administered 2020-09-29: 150 mg via INTRAVENOUS
  Filled 2020-09-29: qty 7.14

## 2020-09-29 NOTE — Patient Instructions (Signed)
[facility] Discharge Instructions for Patients Receiving Chemotherapy  Today you received the following chemotherapy agents Trastuzumab-dkst (OGIVRI) & Paclitaxel (TAXOL).  To help prevent nausea and vomiting after your treatment, we encourage you to take your nausea medication as prescribed.  If you develop nausea and vomiting that is not controlled by your nausea medication, call the clinic.   BELOW ARE SYMPTOMS THAT SHOULD BE REPORTED IMMEDIATELY:  *FEVER GREATER THAN 100.5 F  *CHILLS WITH OR WITHOUT FEVER  NAUSEA AND VOMITING THAT IS NOT CONTROLLED WITH YOUR NAUSEA MEDICATION  *UNUSUAL SHORTNESS OF BREATH  *UNUSUAL BRUISING OR BLEEDING  TENDERNESS IN MOUTH AND THROAT WITH OR WITHOUT PRESENCE OF ULCERS  *URINARY PROBLEMS  *BOWEL PROBLEMS  UNUSUAL RASH Items with * indicate a potential emergency and should be followed up as soon as possible.  Feel free to call the clinic should you have any questions or concerns. The clinic phone number is [phone].  Please show the CHEMO ALERT CARD at check-in to the Emergency Department and triage nurse.   

## 2020-09-29 NOTE — Assessment & Plan Note (Signed)
07/01/2020:Screening mammogram showed a right breast mass. Mammogram and US showed a 0.7cm mass at the 6 o'clock position in the right breast, no axillary adenopathy. 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 T1BN0 stage Ia  08/11/2020:Right lumpectomy [name]): IDC, grade 3, 0.8cm, clear margins, 3 right axillary lymph nodes negative for carcinoma.ER 30% week, PR 0%, HER-2 positive, Ki-67 40%  Treatment plan: 1.adjuvant Taxol Herceptin 2.Adjuvant radiation therapy 3.Follow-up adjuvant antiestrogen therapy Patient is participating in the neuropathy clinical trial SWOG S1714 --------------------------------------------------------------------------------------------------------------------------------------- Current treatment: Cycle 2 day 1 Taxol Herceptin Echocardiogram 08/22/2020: EF 40% Next echocardiogram scheduled for 10/07/2020. Further decisions regarding Herceptin will be taken after that echocardiogram.

## 2020-09-29 NOTE — Progress Notes (Signed)
Verbal order per Dr. Lindi Adie: okay to treat patient with Echo of 40% from 08/22/2020. New Echo scheduled.

## 2020-09-30 LAB — CUP PACEART REMOTE DEVICE CHECK
Battery Remaining Longevity: 174 mo
Battery Remaining Percentage: 100 %
Brady Statistic RV Percent Paced: 0 %
Date Time Interrogation Session: 20211110050100
HighPow Impedance: 64 Ohm
Implantable Lead Implant Date: 20200730
Implantable Lead Location: 753860
Implantable Lead Model: 292
Implantable Lead Serial Number: 447014
Implantable Pulse Generator Implant Date: 20200730
Lead Channel Impedance Value: 432 Ohm
Lead Channel Pacing Threshold Amplitude: 1.7 V
Lead Channel Pacing Threshold Pulse Width: 0.4 ms
Lead Channel Setting Pacing Amplitude: 3.5 V
Lead Channel Setting Pacing Pulse Width: 0.4 ms
Lead Channel Setting Sensing Sensitivity: 0.5 mV
Pulse Gen Serial Number: 266091

## 2020-09-30 NOTE — Progress Notes (Signed)
Remote ICD transmission.   

## 2020-10-03 ENCOUNTER — Telehealth: Payer: Self-pay | Admitting: Hematology and Oncology

## 2020-10-03 NOTE — Telephone Encounter (Signed)
Scheduled per 11/11 los. Pt will receive an updated appt calendar per next visit appt notes

## 2020-10-06 ENCOUNTER — Inpatient Hospital Stay: Payer: HMO

## 2020-10-06 ENCOUNTER — Other Ambulatory Visit: Payer: Self-pay

## 2020-10-06 VITALS — BP 118/75 | HR 84 | Temp 98.0°F | Resp 18 | Wt 153.0 lb

## 2020-10-06 DIAGNOSIS — Z95828 Presence of other vascular implants and grafts: Secondary | ICD-10-CM

## 2020-10-06 DIAGNOSIS — Z5111 Encounter for antineoplastic chemotherapy: Secondary | ICD-10-CM | POA: Diagnosis not present

## 2020-10-06 DIAGNOSIS — Z170 Estrogen receptor positive status [ER+]: Secondary | ICD-10-CM

## 2020-10-06 DIAGNOSIS — C50511 Malignant neoplasm of lower-outer quadrant of right female breast: Secondary | ICD-10-CM

## 2020-10-06 LAB — CBC WITH DIFFERENTIAL (CANCER CENTER ONLY)
Abs Immature Granulocytes: 0.03 10*3/uL (ref 0.00–0.07)
Basophils Absolute: 0 10*3/uL (ref 0.0–0.1)
Basophils Relative: 1 %
Eosinophils Absolute: 0.2 10*3/uL (ref 0.0–0.5)
Eosinophils Relative: 3 %
HCT: 40.4 % (ref 36.0–46.0)
Hemoglobin: 12.9 g/dL (ref 12.0–15.0)
Immature Granulocytes: 1 %
Lymphocytes Relative: 35 %
Lymphs Abs: 1.7 10*3/uL (ref 0.7–4.0)
MCH: 29.2 pg (ref 26.0–34.0)
MCHC: 31.9 g/dL (ref 30.0–36.0)
MCV: 91.4 fL (ref 80.0–100.0)
Monocytes Absolute: 0.3 10*3/uL (ref 0.1–1.0)
Monocytes Relative: 7 %
Neutro Abs: 2.6 10*3/uL (ref 1.7–7.7)
Neutrophils Relative %: 53 %
Platelet Count: 286 10*3/uL (ref 150–400)
RBC: 4.42 MIL/uL (ref 3.87–5.11)
RDW: 13.7 % (ref 11.5–15.5)
WBC Count: 4.9 10*3/uL (ref 4.0–10.5)
nRBC: 0 % (ref 0.0–0.2)

## 2020-10-06 LAB — CMP (CANCER CENTER ONLY)
ALT: 16 U/L (ref 0–44)
AST: 15 U/L (ref 15–41)
Albumin: 3.7 g/dL (ref 3.5–5.0)
Alkaline Phosphatase: 47 U/L (ref 38–126)
Anion gap: 11 (ref 5–15)
BUN: 12 mg/dL (ref 8–23)
CO2: 22 mmol/L (ref 22–32)
Calcium: 9.6 mg/dL (ref 8.9–10.3)
Chloride: 107 mmol/L (ref 98–111)
Creatinine: 0.78 mg/dL (ref 0.44–1.00)
GFR, Estimated: >60 mL/min (ref >60)
Glucose, Bld: 108 mg/dL — ABNORMAL HIGH (ref 70–99)
Potassium: 4 mmol/L (ref 3.5–5.1)
Sodium: 140 mmol/L (ref 135–145)
Total Bilirubin: 0.4 mg/dL (ref 0.3–1.2)
Total Protein: 7.1 g/dL (ref 6.5–8.1)

## 2020-10-06 MED ORDER — SODIUM CHLORIDE 0.9 % IV SOLN
80.0000 mg/m2 | Freq: Once | INTRAVENOUS | Status: AC
  Administered 2020-10-06: 144 mg via INTRAVENOUS
  Filled 2020-10-06: qty 24

## 2020-10-06 MED ORDER — TRASTUZUMAB-DKST CHEMO 150 MG IV SOLR
2.0000 mg/kg | Freq: Once | INTRAVENOUS | Status: AC
  Administered 2020-10-06: 147 mg via INTRAVENOUS
  Filled 2020-10-06: qty 7

## 2020-10-06 MED ORDER — DIPHENHYDRAMINE HCL 50 MG/ML IJ SOLN
INTRAMUSCULAR | Status: AC
  Filled 2020-10-06: qty 1

## 2020-10-06 MED ORDER — FAMOTIDINE IN NACL 20-0.9 MG/50ML-% IV SOLN
INTRAVENOUS | Status: AC
  Filled 2020-10-06: qty 50

## 2020-10-06 MED ORDER — ACETAMINOPHEN 325 MG PO TABS
ORAL_TABLET | ORAL | Status: AC
  Filled 2020-10-06: qty 2

## 2020-10-06 MED ORDER — SODIUM CHLORIDE 0.9% FLUSH
10.0000 mL | Freq: Once | INTRAVENOUS | Status: AC
  Administered 2020-10-06: 10 mL
  Filled 2020-10-06: qty 10

## 2020-10-06 MED ORDER — DIPHENHYDRAMINE HCL 50 MG/ML IJ SOLN
25.0000 mg | Freq: Once | INTRAMUSCULAR | Status: AC
  Administered 2020-10-06: 25 mg via INTRAVENOUS

## 2020-10-06 MED ORDER — SODIUM CHLORIDE 0.9% FLUSH
10.0000 mL | INTRAVENOUS | Status: DC | PRN
  Administered 2020-10-06: 10 mL
  Filled 2020-10-06: qty 10

## 2020-10-06 MED ORDER — SODIUM CHLORIDE 0.9 % IV SOLN
Freq: Once | INTRAVENOUS | Status: AC
  Filled 2020-10-06: qty 250

## 2020-10-06 MED ORDER — SODIUM CHLORIDE 0.9 % IV SOLN
10.0000 mg | Freq: Once | INTRAVENOUS | Status: AC
  Administered 2020-10-06: 10 mg via INTRAVENOUS
  Filled 2020-10-06: qty 10

## 2020-10-06 MED ORDER — FAMOTIDINE IN NACL 20-0.9 MG/50ML-% IV SOLN
20.0000 mg | Freq: Once | INTRAVENOUS | Status: AC
  Administered 2020-10-06: 20 mg via INTRAVENOUS

## 2020-10-06 MED ORDER — HEPARIN SOD (PORK) LOCK FLUSH 100 UNIT/ML IV SOLN
500.0000 [IU] | Freq: Once | INTRAVENOUS | Status: AC | PRN
  Administered 2020-10-06: 500 [IU]
  Filled 2020-10-06: qty 5

## 2020-10-06 MED ORDER — ACETAMINOPHEN 325 MG PO TABS
650.0000 mg | ORAL_TABLET | Freq: Once | ORAL | Status: AC
  Administered 2020-10-06: 650 mg via ORAL

## 2020-10-06 NOTE — Patient Instructions (Signed)
Cancer Center Discharge Instructions for Patients Receiving Chemotherapy  Today you received the following chemotherapy agents Trastuzumab-dkst (OGIVRI) & Paclitaxel (TAXOL).  To help prevent nausea and vomiting after your treatment, we encourage you to take your nausea medication as prescribed.  If you develop nausea and vomiting that is not controlled by your nausea medication, call the clinic.   BELOW ARE SYMPTOMS THAT SHOULD BE REPORTED IMMEDIATELY:  *FEVER GREATER THAN 100.5 F  *CHILLS WITH OR WITHOUT FEVER  NAUSEA AND VOMITING THAT IS NOT CONTROLLED WITH YOUR NAUSEA MEDICATION  *UNUSUAL SHORTNESS OF BREATH  *UNUSUAL BRUISING OR BLEEDING  TENDERNESS IN MOUTH AND THROAT WITH OR WITHOUT PRESENCE OF ULCERS  *URINARY PROBLEMS  *BOWEL PROBLEMS  UNUSUAL RASH Items with * indicate a potential emergency and should be followed up as soon as possible.  Feel free to call the clinic should you have any questions or concerns. The clinic phone number is [phone].  Please show the CHEMO ALERT CARD at check-in to the Emergency Department and triage nurse.   

## 2020-10-07 ENCOUNTER — Other Ambulatory Visit (HOSPITAL_COMMUNITY): Payer: No Typology Code available for payment source

## 2020-10-07 ENCOUNTER — Encounter (HOSPITAL_COMMUNITY): Payer: Self-pay | Admitting: Cardiology

## 2020-10-07 ENCOUNTER — Ambulatory Visit (HOSPITAL_COMMUNITY)
Admission: RE | Admit: 2020-10-07 | Discharge: 2020-10-07 | Disposition: A | Discharge status: Home / Self Care | Payer: No Typology Code available for payment source | Source: Ambulatory Visit | Attending: Cardiology | Admitting: Cardiology

## 2020-10-07 ENCOUNTER — Ambulatory Visit (HOSPITAL_BASED_OUTPATIENT_CLINIC_OR_DEPARTMENT_OTHER)
Admission: RE | Admit: 2020-10-07 | Discharge: 2020-10-07 | Disposition: A | Discharge status: Home / Self Care | Payer: No Typology Code available for payment source | Source: Ambulatory Visit | Attending: Cardiology | Admitting: Cardiology

## 2020-10-07 VITALS — BP 128/78 | HR 90 | Wt 153.2 lb

## 2020-10-07 DIAGNOSIS — Z95810 Presence of automatic (implantable) cardiac defibrillator: Secondary | ICD-10-CM | POA: Diagnosis not present

## 2020-10-07 DIAGNOSIS — Z170 Estrogen receptor positive status [ER+]: Secondary | ICD-10-CM | POA: Insufficient documentation

## 2020-10-07 DIAGNOSIS — I428 Other cardiomyopathies: Secondary | ICD-10-CM | POA: Diagnosis not present

## 2020-10-07 DIAGNOSIS — I110 Hypertensive heart disease with heart failure: Secondary | ICD-10-CM | POA: Diagnosis not present

## 2020-10-07 DIAGNOSIS — E785 Hyperlipidemia, unspecified: Secondary | ICD-10-CM | POA: Insufficient documentation

## 2020-10-07 DIAGNOSIS — Z79899 Other long term (current) drug therapy: Secondary | ICD-10-CM | POA: Diagnosis not present

## 2020-10-07 DIAGNOSIS — I5022 Chronic systolic (congestive) heart failure: Secondary | ICD-10-CM | POA: Insufficient documentation

## 2020-10-07 DIAGNOSIS — E119 Type 2 diabetes mellitus without complications: Secondary | ICD-10-CM | POA: Diagnosis not present

## 2020-10-07 DIAGNOSIS — Z9221 Personal history of antineoplastic chemotherapy: Secondary | ICD-10-CM | POA: Insufficient documentation

## 2020-10-07 DIAGNOSIS — C50919 Malignant neoplasm of unspecified site of unspecified female breast: Secondary | ICD-10-CM | POA: Insufficient documentation

## 2020-10-07 LAB — ECHOCARDIOGRAM COMPLETE
Area-P 1/2: 3.02 cm2
S' Lateral: 4.5 cm

## 2020-10-07 LAB — DIGOXIN LEVEL: Digoxin Level: 0.5 ng/mL — ABNORMAL LOW (ref 1.0–2.0)

## 2020-10-07 MED ORDER — LOSARTAN POTASSIUM 50 MG PO TABS
50.0000 mg | ORAL_TABLET | Freq: Two times a day (BID) | ORAL | 6 refills | Status: DC
Start: 2020-10-07 — End: 2021-01-31

## 2020-10-07 NOTE — Patient Instructions (Signed)
Increase Losartan to 50 mg Twice daily   Labs done today, your results will be available in MyChart, we will contact you for abnormal readings.  Your physician recommends that you schedule a follow-up appointment in: 2 months with echocardiogram  If you have any questions or concerns before your next appointment please send [place] a message through [place] or call our office at [phone].    TO LEAVE A MESSAGE FOR THE NURSE SELECT OPTION 2, PLEASE LEAVE A MESSAGE INCLUDING: . YOUR NAME . DATE OF BIRTH . CALL BACK NUMBER . REASON FOR CALL**this is important as we prioritize the call backs  [place] AS LONG AS YOU CALL BEFORE 4:00 PM  At the [facility], you and your health needs are our priority. As part of our continuing mission to provide you with exceptional heart care, we have created designated Provider Care Teams. These Care Teams include your primary Cardiologist (physician) and Advanced Practice Providers (APPs- Physician Assistants and Nurse Practitioners) who all work together to provide you with the care you need, when you need it.   You may see any of the following providers on your designated Care Team at your next follow up: Marland Kitchen Dr Glori Bickers . Dr Loralie Champagne . Darrick Grinder, NP . Lyda Jester, PA . Audry Riles, PharmD   Please be sure to bring in all your medications bottles to every appointment.

## 2020-10-07 NOTE — Progress Notes (Signed)
  Echocardiogram 2D Echocardiogram has been performed.  Jannett Celestine 10/07/2020, 11:50 AM

## 2020-10-09 NOTE — Progress Notes (Signed)
PCP: Dr. Nancy Fetter Cardiology: Dr. Tamala Julian HF Cardiology: Dr. Aundra Dubin Oncology: Dr. Lindi Adie  Toni [name] is a 64 y.o. [employer] employee with a history of chronic systolic heart failure, due to nonischemic cardiomyopathy, HTN, DM, and hyperlipidemia.   Initially diagnosed with NICM in 1998 thought to be from HTN versus viral. Had cath in 1998 that was negative for coronary disease. EF at that time was 20% but EF recovered in 2012.   In October 2019, she had a cough/virus and she took OTC meds. Says she would feel better for a little while but then felt bad again. 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  She presented to [facility] ED on 11/13/18 with increased shortness of breath. She was admitted and echo was completed showing EF had gone back down to 15%. She had RHC/LHC with nonobstructive CAD and relatively preserved cardiac output. She was diuresed in the hospital and discharged. CPX in 2/20 showed only mild HF limitation.   Given family history of cardiomyopathy, I sent genetic testing.  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  I had her see Dr. Broadus John, we think that the variant may be benign and unrelated to her cardiomyopathy.  She has no history of conduction disturbance.   Echo in 6/20 showed that EF remains low at 25-30%, mild LV dilation, mildly decreased RV systolic function.  She had a [place] placed.   8/21 diagnosed with right breast cancer, ER+/PR-/HER2+.  She had right lumpectomy.   Echo in 10/21 showed EF 40% with mildly decreased RV systolic function. She has been getting Taxol + Herceptin.  Repeat echo was done today, showing EF 30% with mild LV dilation, mildly decreased RV systolic function.   She returns for followup of CHF. Doing well symptomatically. No significant exertional dyspnea.  No orthopnea/PND.  No lightheadedness.    [place]  Scientific device interrogation: Heartlogic score 8, no VT    Labs (1/20): K 4, creatinine 0.67 => 0.74 Labs (3/20): K 4.1, creatinine 0.87 Labs (6/20): digoxin level 0.9 Labs (7/20): K 4.1, creatinine 0.74 Labs (8/20): digoxin 0.4 Labs (11/20): K 4.5, creatinine 0.65, digoxin 1.2 Labs (2/21): digoxin 0.4, K 4.2, creatinine 0.9 Labs (6/21): K 5.1, creatinine 0.83 Labs (9/21): K 4.6, creatinine 0.89 Labs (11/21): K 4, creatinine 0.78  PMH: 1. HTN 2. Type 2 diabetes 3. Hyperlipidemia 4. Chronic systolic CHF: Nonischemic cardiomyopathy.  Diagnosed in 1998, EF 20% by echo at that time.  Echo back to normal range by 2012.   - LHC/RHC (12/19): D1 60-70% stenosis; mean RA 6, PA 58/22, mean PCWP 22, CI 2.9.  - Echo (12/19): EF 15% with severe LV dilation, moderate central MR likely functional.  - Cardiac MRI (12/19): Moderate LV dilation with EF 14%, mild RV dilation with EF 17%, LGE at the inferior RV insertion site (nonspecific).  - CPX (2/20): peak VO2 18.6, VE/VCO2 31, RER 1.18 => mild HF limitation.  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  Suspect the variant is benign.  - Echo (6/20): EF 25-30%, mild LV dilation, mildly decreased RV systolic function.  - Echo (10/21): EF 40%, diffuse hypokinesis, mildly decreased RV systolic function.  - Echo (11/21): EF 30%, diffuse hypokinesis, mildly decreased RV systolic function.  5. Angioedema with ACEI 6. Left shoulder adhesive capsulitis 7. Depression 8. Breast cancer:  8/21 diagnosed with right breast cancer, ER+/PR-/HER2+.  She had right lumpectomy.   Social History   Socioeconomic History  . Marital status: Single    Spouse name: Not on file  . Number of children: Not on file  . Years of education: 77  . Highest education level: Bachelor's degree (e.g., BA, AB, BS)  Occupational History  . Occupation: Surveyor, quantity: [employer]  Tobacco Use  .  Smoking status: Never Smoker  . Smokeless tobacco: Never Used  Vaping Use  . Vaping Use: Never used  Substance and Sexual Activity  . Alcohol use: Yes    Alcohol/week: 0.0 standard drinks    Comment: less than once a month  . Drug use: No  . Sexual activity: Never  Other Topics Concern  . Not on file  Social History Narrative   Works at [employer].  Lives alone.     Social Determinants of Health   Financial Resource Strain: Low Risk   . Difficulty of Paying Living Expenses: Not hard at all  Food Insecurity: No Food Insecurity  . Worried About Charity fundraiser in the Last Year: Never true  . Ran Out of Food in the Last Year: Never true  Transportation Needs: No Transportation Needs  . Lack of Transportation (Medical): No  . Lack of Transportation (Non-Medical): No  Physical Activity: Inactive  . Days of Exercise per Week: 0 days  . Minutes of Exercise per Session: 0 min  Stress: Stress Concern Present  . Feeling of Stress : Very much  Social Connections:   . Frequency of Communication with Friends and Family: Not on file  . Frequency of Social Gatherings with Friends and Family: Not on file  . Attends Religious Services: Not on file  . Active Member of Clubs or Organizations: Not on file  . Attends Archivist Meetings: Not on file  . Marital Status: Not on file  Intimate Partner Violence:   . Fear of Current or Ex-Partner: Not on file  . Emotionally Abused: Not on file  . Physically Abused: Not on file  . Sexually Abused: Not on file   Family History  Problem Relation Age of Onset  . Diabetes Mellitus I Mother   . Lung cancer Mother 69  . Breast cancer Mother        dx. late 30s/early 87s  . Diabetes Mellitus I Father   . Sudden death Father 13  . Prostate cancer Brother 34  . Hypertension Brother   . Hypertension Brother   . Diabetes Mellitus I Brother   . Benign prostatic hyperplasia Brother   . Heart failure Paternal Uncle   . Thyroid cancer Niece         dx. in her 94s  . Breast cancer Cousin        dx. in her 63s, recurrence in her 39s (maternal first cousin)  . Breast cancer Cousin        female dx. in his early 14s (paternal first cousin)  . Cancer Cousin        dx. in his early 48s, unknown type (paternal first cousin)  . Cancer Cousin 48       unknown type (paternal first cousin)   ROS: All systems reviewed and negative except as per HPI.   Current Outpatient Medications  Medication Sig Dispense Refill  . aspirin 81 MG chewable tablet Chew 1 tablet (81 mg total) by mouth daily. 30 tablet 0  . carvedilol (  COREG) 25 MG tablet TAKE 1 TABLET (25 MG TOTAL) BY MOUTH 2 (TWO) TIMES DAILY WITH A MEAL. 180 tablet 3  . cetirizine (ZYRTEC) 10 MG tablet Take 10 mg by mouth daily.     . Continuous Blood Gluc Receiver (FREESTYLE LIBRE 14 DAY READER) DEVI Apply topically as directed.    . Continuous Blood Gluc Sensor (FREESTYLE LIBRE 14 DAY SENSOR) MISC Apply topically as directed.    . digoxin (LANOXIN) 0.125 MG tablet Take 0.5 tablets (0.0625 mg total) by mouth daily. 45 tablet 1  . furosemide (LASIX) 20 MG tablet Take 20 mg by mouth daily as needed for fluid or edema.     Marland Kitchen glipiZIDE (GLUCOTROL XL) 10 MG 24 hr tablet Take 20 mg by mouth daily with breakfast.     . hydrALAZINE (APRESOLINE) 50 MG tablet TAKE 1 TABLET (50 MG TOTAL) BY MOUTH 3 (THREE) TIMES DAILY. 270 tablet 1  . insulin glargine (LANTUS) 100 UNIT/ML injection Inject 12 Units into the skin daily.    . isosorbide dinitrate (ISORDIL) 20 MG tablet TAKE 1 TABLET (20 MG TOTAL) BY MOUTH 3 TIMES DAILY. 90 tablet 11  . JARDIANCE 25 MG TABS tablet Take 25 mg by mouth daily.    Marland Kitchen lidocaine-prilocaine (EMLA) cream Apply to affected area once 30 g 3  . LINZESS 145 MCG CAPS capsule Take 145 mcg by mouth daily as needed (constipation). Constipation    . losartan (COZAAR) 50 MG tablet Take 1 tablet (50 mg total) by mouth in the morning and at bedtime. 60 tablet 6  . metFORMIN (GLUCOPHAGE) 1000  MG tablet Take 1,000 mg by mouth 2 (two) times daily with a meal.    . Multiple Vitamin (MULTIVITAMIN WITH MINERALS) TABS tablet Take 1 tablet by mouth daily. Centrum Silver    . ondansetron (ZOFRAN) 8 MG tablet Take 1 tablet (8 mg total) by mouth 2 (two) times daily as needed (Nausea or vomiting). 30 tablet 1  . OZEMPIC, 1 MG/DOSE, 4 MG/3ML SOPN Inject 1 mg into the skin every Saturday.     . prochlorperazine (COMPAZINE) 10 MG tablet Take 1 tablet (10 mg total) by mouth every 6 (six) hours as needed (Nausea or vomiting). 30 tablet 1  . simvastatin (ZOCOR) 20 MG tablet Take 20 mg by mouth at bedtime.     Marland Kitchen spironolactone (ALDACTONE) 25 MG tablet TAKE 1 TABLET BY MOUTH EVERY EVENING. 90 tablet 3  . traMADol (ULTRAM) 50 MG tablet Take 1-2 tablets (50-100 mg total) by mouth every 6 (six) hours as needed for moderate pain. 20 tablet 0  . TRUE METRIX BLOOD GLUCOSE TEST test strip 1 each by Other route as directed.   5  . TRUEPLUS LANCETS 30G MISC 1 each by Other route as directed. Use as directed.  5  . UNIFINE PENTIPS 32G X 4 MM MISC 1 each by Other route as directed.      No current facility-administered medications for this encounter.   BP 128/78   Pulse 90   Wt 69.5 kg (153 lb 3.2 oz)   SpO2 99%   BMI 25.49 kg/m  General: NAD Neck: No JVD, no thyromegaly or thyroid nodule.  Lungs: Clear to auscultation bilaterally with normal respiratory effort. CV: Nondisplaced PMI.  Heart regular S1/S2, no S3/S4, no murmur.  No peripheral edema.  No carotid bruit.  Normal pedal pulses.  Abdomen: Soft, nontender, no hepatosplenomegaly, no distention.  Skin: Intact without lesions or rashes.  Neurologic: Alert and oriented x 3.  Psych: Normal affect. Extremities: No clubbing or cyanosis.  HEENT: Normal.   Assessment/Plan: 1. Chronic systolic CHF: Nonischemic cardiomyopathy by 12/19 cath.  Cardiac MRI with LV EF 14%, RV EF 17%. No definite evidence for myocarditis or infiltrative disease by delayed  enhancement images.  Most likely cause of cardiomyopathy is familial versus prior viral myocarditis.  Brother also had a cardiomyopathy of uncertain etiology.  Genetic testing was done, showing an LMNA gene variant of uncertain significance => she saw Dr. Broadus John, suspect benign/uninvolved variant (no conduction abnormality).  CPX in 2/20 showed only mild HF limitation.  Echo in 6/20 showed EF 25-30%. Echo in 10/21 showed EF up some to 40%.  She now has a [place].  She was started on Taxol/Herceptin chemotherapy for breast cancer and today's echo shows fall in EF to 30%.  On exam and by Heartlogic, she is not volume overloaded.  NYHA class II symptoms.  Narrow QRS, not CRT candidate.  - Continue Coreg 25 mg bid.   - Increase losartan to 50 mg bid with BMET in 10 days.  She cannot take Entresto with history of ACEI angioedema.   - Continue Jardiance.   - She will continue to take Lasix only prn.   - Continue spironolactone 25 mg daily.  - Continue digoxin, check level.  - Continue hydralazine 50 mg tid and isordil 20 mg tid.  2. Type II diabetes: She is on dapagliflozin.  3. Breast cancer: HER2+.  Ideally, would have treatment with Taxol/Herceptin followed by Herceptin alone to complete a year.  She has been getting Herceptin and Taxol chemotherapy.  EF has fallen from 40% pre-chemo to 30% on today's echo.  - With fall in EF from 40% to 30%, I think that she will need to stop Herceptin.  I do not think it is going to be a good option for her at this point. Will send a message to Dr. Lindi Adie.   Followup in 2 months with echo.   Loralie Champagne 10/09/2020

## 2020-10-10 NOTE — Progress Notes (Signed)
10/07/20 ECHO showed decreased EF. Hold Herceptin per Dr. Lindi Adie. Orders for Herceptin removed until Feb. Cardio f/u is in Feb. Repeat ECHO 12/20/20.  B. Shameca Landen, Pharmd, BCPS, BCOP

## 2020-10-11 ENCOUNTER — Encounter: Payer: Self-pay | Admitting: *Deleted

## 2020-10-11 MED FILL — Dexamethasone Sodium Phosphate Inj 100 MG/10ML: INTRAMUSCULAR | Qty: 1 | Status: AC

## 2020-10-12 ENCOUNTER — Inpatient Hospital Stay: Payer: HMO

## 2020-10-12 ENCOUNTER — Other Ambulatory Visit: Payer: Self-pay

## 2020-10-12 ENCOUNTER — Encounter: Payer: Self-pay | Admitting: Medical Oncology

## 2020-10-12 ENCOUNTER — Inpatient Hospital Stay (HOSPITAL_BASED_OUTPATIENT_CLINIC_OR_DEPARTMENT_OTHER): Payer: HMO | Admitting: Adult Health

## 2020-10-12 VITALS — BP 102/64 | HR 90 | Temp 98.1°F | Resp 18 | Ht 65.0 in | Wt 155.3 lb

## 2020-10-12 DIAGNOSIS — Z170 Estrogen receptor positive status [ER+]: Secondary | ICD-10-CM

## 2020-10-12 DIAGNOSIS — C50511 Malignant neoplasm of lower-outer quadrant of right female breast: Secondary | ICD-10-CM | POA: Diagnosis not present

## 2020-10-12 DIAGNOSIS — Z5111 Encounter for antineoplastic chemotherapy: Secondary | ICD-10-CM | POA: Diagnosis not present

## 2020-10-12 DIAGNOSIS — Z95828 Presence of other vascular implants and grafts: Secondary | ICD-10-CM

## 2020-10-12 LAB — CBC WITH DIFFERENTIAL (CANCER CENTER ONLY)
Abs Immature Granulocytes: 0.02 10*3/uL (ref 0.00–0.07)
Basophils Absolute: 0 10*3/uL (ref 0.0–0.1)
Basophils Relative: 1 %
Eosinophils Absolute: 0.1 10*3/uL (ref 0.0–0.5)
Eosinophils Relative: 3 %
HCT: 39.2 % (ref 36.0–46.0)
Hemoglobin: 12.7 g/dL (ref 12.0–15.0)
Immature Granulocytes: 0 %
Lymphocytes Relative: 31 %
Lymphs Abs: 1.5 10*3/uL (ref 0.7–4.0)
MCH: 29.5 pg (ref 26.0–34.0)
MCHC: 32.4 g/dL (ref 30.0–36.0)
MCV: 91.2 fL (ref 80.0–100.0)
Monocytes Absolute: 0.3 10*3/uL (ref 0.1–1.0)
Monocytes Relative: 5 %
Neutro Abs: 3 10*3/uL (ref 1.7–7.7)
Neutrophils Relative %: 60 %
Platelet Count: 288 10*3/uL (ref 150–400)
RBC: 4.3 MIL/uL (ref 3.87–5.11)
RDW: 13.7 % (ref 11.5–15.5)
WBC Count: 5 10*3/uL (ref 4.0–10.5)
nRBC: 0 % (ref 0.0–0.2)

## 2020-10-12 LAB — CMP (CANCER CENTER ONLY)
ALT: 18 U/L (ref 0–44)
AST: 20 U/L (ref 15–41)
Albumin: 3.7 g/dL (ref 3.5–5.0)
Alkaline Phosphatase: 50 U/L (ref 38–126)
Anion gap: 9 (ref 5–15)
BUN: 15 mg/dL (ref 8–23)
CO2: 24 mmol/L (ref 22–32)
Calcium: 9.3 mg/dL (ref 8.9–10.3)
Chloride: 105 mmol/L (ref 98–111)
Creatinine: 0.82 mg/dL (ref 0.44–1.00)
GFR, Estimated: >60 mL/min (ref >60)
Glucose, Bld: 133 mg/dL — ABNORMAL HIGH (ref 70–99)
Potassium: 4 mmol/L (ref 3.5–5.1)
Sodium: 138 mmol/L (ref 135–145)
Total Bilirubin: 0.3 mg/dL (ref 0.3–1.2)
Total Protein: 7.3 g/dL (ref 6.5–8.1)

## 2020-10-12 MED ORDER — SODIUM CHLORIDE 0.9 % IV SOLN
Freq: Once | INTRAVENOUS | Status: AC
  Filled 2020-10-12: qty 250

## 2020-10-12 MED ORDER — SODIUM CHLORIDE 0.9% FLUSH
10.0000 mL | Freq: Once | INTRAVENOUS | Status: AC
  Administered 2020-10-12: 10 mL
  Filled 2020-10-12: qty 10

## 2020-10-12 MED ORDER — ACETAMINOPHEN 325 MG PO TABS: 650.0000 mg | ORAL_TABLET | Freq: Once | ORAL | Status: DC

## 2020-10-12 MED ORDER — DIPHENHYDRAMINE HCL 50 MG/ML IJ SOLN
25.0000 mg | Freq: Once | INTRAMUSCULAR | Status: AC
  Administered 2020-10-12: 25 mg via INTRAVENOUS

## 2020-10-12 MED ORDER — DEXAMETHASONE SODIUM PHOSPHATE 10 MG/ML IJ SOLN
INTRAMUSCULAR | Status: AC
  Filled 2020-10-12: qty 1

## 2020-10-12 MED ORDER — SODIUM CHLORIDE 0.9 % IV SOLN
80.0000 mg/m2 | Freq: Once | INTRAVENOUS | Status: AC
  Administered 2020-10-12: 144 mg via INTRAVENOUS
  Filled 2020-10-12: qty 24

## 2020-10-12 MED ORDER — DIPHENHYDRAMINE HCL 25 MG PO CAPS
ORAL_CAPSULE | ORAL | Status: AC
  Filled 2020-10-12: qty 1

## 2020-10-12 MED ORDER — SODIUM CHLORIDE 0.9% FLUSH
10.0000 mL | INTRAVENOUS | Status: DC | PRN
  Administered 2020-10-12: 10 mL
  Filled 2020-10-12: qty 10

## 2020-10-12 MED ORDER — SODIUM CHLORIDE 0.9 % IV SOLN: 4.0000 mg | Freq: Once | INTRAVENOUS | Status: DC

## 2020-10-12 MED ORDER — FAMOTIDINE IN NACL 20-0.9 MG/50ML-% IV SOLN
20.0000 mg | Freq: Once | INTRAVENOUS | Status: AC
  Administered 2020-10-12: 20 mg via INTRAVENOUS

## 2020-10-12 MED ORDER — ACETAMINOPHEN 325 MG PO TABS
ORAL_TABLET | ORAL | Status: AC
  Filled 2020-10-12: qty 2

## 2020-10-12 MED ORDER — HEPARIN SOD (PORK) LOCK FLUSH 100 UNIT/ML IV SOLN
500.0000 [IU] | Freq: Once | INTRAVENOUS | Status: AC | PRN
  Administered 2020-10-12: 500 [IU]
  Filled 2020-10-12: qty 5

## 2020-10-12 MED ORDER — DEXAMETHASONE SODIUM PHOSPHATE 10 MG/ML IJ SOLN
4.0000 mg | Freq: Once | INTRAMUSCULAR | Status: AC
  Administered 2020-10-12: 4 mg via INTRAVENOUS

## 2020-10-12 MED ORDER — DIPHENHYDRAMINE HCL 50 MG/ML IJ SOLN
INTRAMUSCULAR | Status: AC
  Filled 2020-10-12: qty 1

## 2020-10-12 MED ORDER — FAMOTIDINE IN NACL 20-0.9 MG/50ML-% IV SOLN
INTRAVENOUS | Status: AC
  Filled 2020-10-12: qty 50

## 2020-10-12 NOTE — Addendum Note (Signed)
Addended by: Tora Kindred on: 10/12/2020 11:12 AM   Modules accepted: Orders

## 2020-10-12 NOTE — Patient Instructions (Signed)
Cancer Center Discharge Instructions for Patients Receiving Chemotherapy  Today you received the following chemotherapy agents Paclitaxel (TAXOL).  To help prevent nausea and vomiting after your treatment, we encourage you to take your nausea medication as prescribed.  If you develop nausea and vomiting that is not controlled by your nausea medication, call the clinic.   BELOW ARE SYMPTOMS THAT SHOULD BE REPORTED IMMEDIATELY:  *FEVER GREATER THAN 100.5 F  *CHILLS WITH OR WITHOUT FEVER  NAUSEA AND VOMITING THAT IS NOT CONTROLLED WITH YOUR NAUSEA MEDICATION  *UNUSUAL SHORTNESS OF BREATH  *UNUSUAL BRUISING OR BLEEDING  TENDERNESS IN MOUTH AND THROAT WITH OR WITHOUT PRESENCE OF ULCERS  *URINARY PROBLEMS  *BOWEL PROBLEMS  UNUSUAL RASH Items with * indicate a potential emergency and should be followed up as soon as possible.  Feel free to call the clinic should you have any questions or concerns. The clinic phone number is [phone].  Please show the CHEMO ALERT CARD at check-in to the Emergency Department and triage nurse.   

## 2020-10-12 NOTE — Progress Notes (Signed)
H7290, A PROSPECTIVE OBSERVATIONAL COHORT STUDY TO DEVELOP A PREDICTIVE MODEL OF TAXANE-INDUCED PERIPHERAL NEUROPATHY IN CANCER PATIENTS.  4 Weeks Assessment  Patient in clinic today for continuation of her treatment. Patient was met with before her appointments and was provided the study questionnaires to complete. I reviewed with patient any neuropathy symptoms she may be having. Patient states she has some minor cold sensation to the the tips of her pinky and ring finger to her left hand, as well as, minor sensation of numbness to her right foot. Patient will be seen by NP Wilber Bihari, prior to her infusion appointment.    PROs: Questionnaires were given to patient to complete in clinic prior to registration. Collected questionnaires and checked for completeness and accuracy.  Labs: Patient did not consent to the optional whole blood collection.  Physician Assessments: CTCAE and Treatment Burden forms reviewed and completed NP Wilber Bihari and confirmed by Dr. Lindi Adie. Patient with dysesthesia grade 1 and peripheral sensory neuropathy grade 1, with a possible attribution to study intervention.  History of Falls: Not collected at this time point. Assessment for Interventions for CIPN: Reviewed with patient and CRFs completed. Neuropen Assessment: Completed per protocol by this certified research RN, time recording completed by Carol Ada, Clinical Research Coordinator.  [place] Assessment: Completed per protocol by this Film/video editor, time recording completed by Carol Ada, Scientist, physiological. Timed Get Up and Go Test: Not collected at this time point.  Plan: Patient informed of next study assessments to be in approximately 4 weeks and we will try to complete on same day as treatment.  Plan for this visit to be approximately 11/03/2020 (+/- 14 days) for collection of 8 weeks assessment.  Patient denied having any questions at this time. Patient thanked for her time and  contribution to study and was encouraged to call clinic or myself with any questions or concerns she may have prior to her next appointment. Maxwell Marion, RN, BSN, [facility] Clinical Research 10/12/2020 10:29 AM

## 2020-10-12 NOTE — Assessment & Plan Note (Addendum)
07/01/2020:Screening mammogram showed a right breast mass. Mammogram and US showed a 0.7cm mass at the 6 o'clock position in the right breast, no axillary adenopathy. 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 T1BN0 stage Ia  08/11/2020:Right lumpectomy Toni [name]): IDC, grade 3, 0.8cm, clear margins, 3 right axillary lymph nodes negative for carcinoma.ER 30% week, PR 0%, HER-2 positive, Ki-67 40%  Treatment plan: 1.adjuvant Taxol Herceptin/Herceptin discontinued after 5 cycles due to worsening cardiomyopathy 2.Adjuvant radiation therapy 3.Follow-up adjuvant antiestrogen therapy Patient is participating in the neuropathy clinical trial SWOG S1714 --------------------------------------------------------------------------------------------------------------------------------------- Current treatment: Cycle 6 day 1 of Taxol  She has a grade 1 peripheral sensory neuropathy.  This is intermittent and brief. She is otherwise tolerating her weekly Taxol well and will continue this.  Dr. Lindi Adie was unable to join the visit today, however I let her know that I will touch base with him about any other options for treatment such as neratinib.  Patient was appreciative of this.    She will continue with her weekly Taxol and we will see her with every other treatment.

## 2020-10-12 NOTE — Progress Notes (Signed)
[place] [facility] Follow up:    Toni [name], [place] [address]   DIAGNOSIS: Cancer Staging Malignant neoplasm of lower-outer quadrant of right breast of female, estrogen receptor positive ([place]) Staging form: Breast, AJCC 8th Edition - Clinical stage from 07/06/2020: Stage IB (cT1b, cN0, cM0, G3, ER+, PR-, HER2-) - Signed by Nicholas Lose, MD on 07/06/2020 - Pathologic stage from 08/18/2020: Stage IA (pT1b, pN0, cM0, G3, ER+, PR-, HER2+) - Signed by Gardenia Phlegm, NP on 10/12/2020   SUMMARY OF ONCOLOGIC HISTORY: Oncology History  Malignant neoplasm of lower-outer quadrant of right breast of female, estrogen receptor positive ([place])  07/01/2020 Initial Diagnosis   Screening mammogram showed a right breast mass. Mammogram and US showed a 0.7cm mass at the 6 o'clock position in the right breast, no axillary adenopathy. 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    07/06/2020 Cancer Staging   Staging form: Breast, AJCC 8th Edition - Clinical stage from 07/06/2020: Stage IB (cT1b, cN0, cM0, G3, ER+, PR-, HER2-) - Signed by Nicholas Lose, MD on 07/06/2020   07/26/2020 Genetic Testing   Negative genetic testing:  No pathogenic variants detected on the [facility] Common Hereditary Cancers Panel. The report date is 07/26/2020.   The Common Hereditary Cancers Panel offered by [facility] includes sequencing and/or deletion duplication testing of the following 48 genes: APC, ATM, AXIN2, BARD1, BMPR1A, BRCA1, BRCA2, BRIP1, CDH1, CDK4, CDKN2A (p14ARF), CDKN2A (p16INK4a), CHEK2, CTNNA1, DICER1, EPCAM (Deletion/duplication testing only), GREM1 (promoter region deletion/duplication testing only), KIT, MEN1, MLH1, MSH2, MSH3, MSH6, MUTYH, NBN, NF1, NTHL1, PALB2, PDGFRA, PMS2, POLD1, POLE, PTEN, RAD50, RAD51C, RAD51D, RNF43, SDHB, SDHC, SDHD, SMAD4, SMARCA4. STK11, TP53, TSC1, TSC2,  and VHL.  The following genes were evaluated for sequence changes only: SDHA and HOXB13 c.251G>A variant only.   08/11/2020 Surgery   Right lumpectomy [name]): IDC, grade 3, 0.8cm, clear margins, 3 right axillary lymph nodes negative for carcinoma. ER 30% weak, PR 0%, HER-2 positive, Ki-67 40%   08/18/2020 Cancer Staging   Staging form: Breast, AJCC 8th Edition - Pathologic stage from 08/18/2020: Stage IA (pT1b, pN0, cM0, G3, ER+, PR-, HER2+) - Signed by Gardenia Phlegm, NP on 10/12/2020   09/08/2020 -  Adjuvant Chemotherapy   Weekly Taxol/herceptin x 12, followed by herceptin maintenance x 1 year; Herceptin stopped after first 5 weekly cycles due to decline in EF from 40% to 30%.       CURRENT THERAPY: Taxol/Herceptin week 6  INTERVAL HISTORY: Toni [name] 64 y.o. female returns for evaluation and management of her ER positive, HER-2 positive breast cancer on adjuvant chemotherapy with Taxol and Herceptin.  She underwent an echocardiogram on 10/07/2020 that demonstrated LVEF of 30% which had declined from 40%.  She was evaluated by Dr. Aundra Dubin on 10/09/2020 and herceptin was recommended to be discontinued.  She is saddened by this news, but notes that she feels well, and minimal intermittent sensory neuropathy in her fingertips, and no other concerns today.   Patient Active Problem List   Diagnosis Date Noted  . Encounter for antineoplastic chemotherapy 09/15/2020  . Port-A-Cath in place 09/08/2020  . Genetic testing 07/28/2020  . Family history of breast cancer   . Family history of prostate cancer   . Family history of thyroid cancer   . Malignant neoplasm of lower-outer quadrant of right breast of female, estrogen receptor positive ([place]) 07/01/2020  .  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 Nonischemic cardiomyopathy ([place]) 06/04/2019  . Hypomagnesemia 11/18/2018  . Hypokalemia 11/15/2018  . Diabetes mellitus without complication ([place])    . Coronary artery disease   . Acute on chronic systolic CHF (congestive heart failure) ([place]) 01/04/2016  . Hyperlipidemia 01/03/2016  . Cardiomegaly - hypertensive 01/03/2016  . DM (diabetes mellitus), type 2 with complications ([place]) 59/56/3875  . Hypertension 01/03/2014    is allergic to biaxin [clarithromycin], enalapril maleate, and vasotec [enalapril].  MEDICAL HISTORY: Past Medical History:  Diagnosis Date  . AICD (automatic cardioverter/defibrillator) present   . Cancer [facility])    Right sided breast cancer  . CHF (congestive heart failure) ([place])   . Diabetes mellitus without complication ([place])   . Family history of breast cancer   . Family history of prostate cancer   . Family history of thyroid cancer   . Hypertension     SURGICAL HISTORY: Past Surgical History:  Procedure Laterality Date  . ABDOMINAL HYSTERECTOMY    . BREAST LUMPECTOMY WITH RADIOACTIVE SEED AND SENTINEL LYMPH NODE BIOPSY Right 08/11/2020   Procedure: RIGHT BREAST LUMPECTOMY WITH RADIOACTIVE SEED AND SENTINEL LYMPH NODE BIOPSY;  Surgeon: Coralie Keens, MD;  Location: [place];  Service: General;  Laterality: Right;  . CARDIAC CATHETERIZATION     in [facility], clean, per pt.  . ICD IMPLANT N/A 06/18/2019   Procedure: ICD IMPLANT;  Surgeon: Evans Lance, MD;  Location: [place] [facility];  Service: Cardiovascular;  Laterality: N/A;  . PORTACATH PLACEMENT N/A 08/31/2020   Procedure: INSERTION PORT-A-CATH WITH ULTRASOUND GUIDANCE;  Surgeon: Coralie Keens, MD;  Location: [place];  Service: General;  Laterality: N/A;  . RIGHT/LEFT HEART CATH AND CORONARY ANGIOGRAPHY N/A 11/17/2018   Procedure: RIGHT/LEFT HEART CATH AND CORONARY ANGIOGRAPHY;  Surgeon: Belva Crome, MD;  Location: [place] [facility];  Service: Cardiovascular;  Laterality: N/A;    SOCIAL HISTORY: Social History   Socioeconomic History  . Marital status: Single    Spouse name: Not on file  . Number of children: Not on file  . Years  of education: 56  . Highest education level: Bachelor's degree (e.g., BA, AB, BS)  Occupational History  . Occupation: Surveyor, quantity: [employer]  Tobacco Use  . Smoking status: Never Smoker  . Smokeless tobacco: Never Used  Vaping Use  . Vaping Use: Never used  Substance and Sexual Activity  . Alcohol use: Yes    Alcohol/week: 0.0 standard drinks    Comment: less than once a month  . Drug use: No  . Sexual activity: Never  Other Topics Concern  . Not on file  Social History Narrative   Works at [employer].  Lives alone.     Social Determinants of Health   Financial Resource Strain: Low Risk   . Difficulty of Paying Living Expenses: Not hard at all  Food Insecurity: No Food Insecurity  . Worried About Charity fundraiser in the Last Year: Never true  . Ran Out of Food in the Last Year: Never true  Transportation Needs: No Transportation Needs  . Lack of Transportation (Medical): No  . Lack of Transportation (Non-Medical): No  Physical Activity: Inactive  . Days of Exercise per Week: 0 days  . Minutes of Exercise per Session: 0 min  Stress: Stress Concern Present  . Feeling of Stress : Very much  Social Connections:   . Frequency of Communication with Friends and Family: Not on  file  . Frequency of Social Gatherings with Friends and Family: Not on file  . Attends Religious Services: Not on file  . Active Member of Clubs or Organizations: Not on file  . Attends Archivist Meetings: Not on file  . Marital Status: Not on file  Intimate Partner Violence:   . Fear of Current or Ex-Partner: Not on file  . Emotionally Abused: Not on file  . Physically Abused: Not on file  . Sexually Abused: Not on file    FAMILY HISTORY: Family History  Problem Relation Age of Onset  . Diabetes Mellitus I Mother   . Lung cancer Mother 67  . Breast cancer Mother        dx. late 30s/early 25s  . Diabetes Mellitus I Father   . Sudden death Father 33  . Prostate  cancer Brother 66  . Hypertension Brother   . Hypertension Brother   . Diabetes Mellitus I Brother   . Benign prostatic hyperplasia Brother   . Heart failure Paternal Uncle   . Thyroid cancer Niece        dx. in her 65s  . Breast cancer Cousin        dx. in her 5s, recurrence in her 38s (maternal first cousin)  . Breast cancer Cousin        female dx. in his early 60s (paternal first cousin)  . Cancer Cousin        dx. in his early 28s, unknown type (paternal first cousin)  . Cancer Cousin 12       unknown type (paternal first cousin)    Review of Systems  Constitutional: Negative for appetite change, chills, fatigue, fever and unexpected weight change.  HENT:   Negative for hearing loss, lump/mass and trouble swallowing.   Eyes: Negative for eye problems and icterus.  Respiratory: Negative for chest tightness, cough and shortness of breath.   Cardiovascular: Negative for chest pain, leg swelling and palpitations.  Gastrointestinal: Negative for abdominal distention, abdominal pain, constipation, diarrhea, nausea and vomiting.  Endocrine: Negative for hot flashes.  Genitourinary: Negative for difficulty urinating.   Musculoskeletal: Negative for arthralgias.  Skin: Negative for itching and rash.  Neurological: Negative for dizziness, extremity weakness, headaches and numbness.  Hematological: Negative for adenopathy. Does not bruise/bleed easily.  Psychiatric/Behavioral: Negative for depression. The patient is not nervous/anxious.       PHYSICAL EXAMINATION  ECOG PERFORMANCE STATUS: 1 - Symptomatic but completely ambulatory  Vitals:   10/12/20 1003  BP: 102/64  Pulse: 90  Resp: 18  Temp: 98.1 F (36.7 C)  SpO2: 100%    Physical Exam Constitutional:      General: She is not in acute distress.    Appearance: Normal appearance. She is not toxic-appearing.  HENT:     Head: Normocephalic and atraumatic.  Eyes:     General: No scleral icterus. Cardiovascular:      Rate and Rhythm: Normal rate and regular rhythm.     Pulses: Normal pulses.     Heart sounds: Normal heart sounds.  Pulmonary:     Effort: Pulmonary effort is normal.     Breath sounds: Normal breath sounds.  Abdominal:     General: Abdomen is flat. Bowel sounds are normal. There is no distension.     Palpations: Abdomen is soft.     Tenderness: There is no abdominal tenderness.  Musculoskeletal:        General: No swelling.     Cervical back: Neck  supple.  Lymphadenopathy:     Cervical: No cervical adenopathy.  Skin:    General: Skin is warm and dry.     Findings: No rash.  Neurological:     General: No focal deficit present.     Mental Status: She is alert.  Psychiatric:        Mood and Affect: Mood normal.        Behavior: Behavior normal.     LABORATORY DATA:  CBC    Component Value Date/Time   WBC 5.0 10/12/2020 0854   WBC 5.3 08/31/2020 1200   RBC 4.30 10/12/2020 0854   HGB 12.7 10/12/2020 0854   HGB 14.8 06/04/2019 0955   HCT 39.2 10/12/2020 0854   HCT 44.8 06/04/2019 0955   PLT 288 10/12/2020 0854   PLT 287 06/04/2019 0955   MCV 91.2 10/12/2020 0854   MCV 90 06/04/2019 0955   MCH 29.5 10/12/2020 0854   MCHC 32.4 10/12/2020 0854   RDW 13.7 10/12/2020 0854   RDW 11.7 06/04/2019 0955   LYMPHSABS 1.5 10/12/2020 0854   LYMPHSABS 2.4 06/04/2019 0955   MONOABS 0.3 10/12/2020 0854   EOSABS 0.1 10/12/2020 0854   EOSABS 0.2 06/04/2019 0955   BASOSABS 0.0 10/12/2020 0854   BASOSABS 0.0 06/04/2019 0955    CMP     Component Value Date/Time   NA 138 10/12/2020 0854   K 4.0 10/12/2020 0854   CL 105 10/12/2020 0854   CO2 24 10/12/2020 0854   GLUCOSE 133 (H) 10/12/2020 0854   BUN 15 10/12/2020 0854   CREATININE 0.82 10/12/2020 0854   CALCIUM 9.3 10/12/2020 0854   PROT 7.3 10/12/2020 0854   ALBUMIN 3.7 10/12/2020 0854   AST 20 10/12/2020 0854   ALT 18 10/12/2020 0854   ALKPHOS 50 10/12/2020 0854   BILITOT 0.3 10/12/2020 0854   GFRNONAA >60 10/12/2020  0854   GFRAA >60 08/22/2020 1635   GFRAA >60 07/06/2020 0821          ASSESSMENT and PLAN:   Malignant neoplasm of lower-outer quadrant of right breast of female, estrogen receptor positive ([place]) 07/01/2020:Screening mammogram showed a right breast mass. Mammogram and US showed a 0.7cm mass at the 6 o'clock position in the right breast, no axillary adenopathy. 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 T1BN0 stage Ia  08/11/2020:Right lumpectomy [name]): IDC, grade 3, 0.8cm, clear margins, 3 right axillary lymph nodes negative for carcinoma.ER 30% week, PR 0%, HER-2 positive, Ki-67 40%  Treatment plan: 1.adjuvant Taxol Herceptin/Herceptin discontinued after 5 cycles due to worsening cardiomyopathy 2.Adjuvant radiation therapy 3.Follow-up adjuvant antiestrogen therapy Patient is participating in the neuropathy clinical trial SWOG S1714 --------------------------------------------------------------------------------------------------------------------------------------- Current treatment: Cycle 6 day 1 of Taxol  She has a grade 1 peripheral sensory neuropathy.  This is intermittent and brief. She is otherwise tolerating her weekly Taxol well and will continue this.  Dr. Lindi Adie was unable to join the visit today, however I let her know that I will touch base with him about any other options for treatment such as neratinib.  Patient was appreciative of this.    She will continue with her weekly Taxol and we will see her with every other treatment.     All questions were answered. The patient knows to call the clinic with any problems, questions or concerns. We can certainly see the patient much sooner if necessary.  Total encounter time: 20 minutes*  Mendel Ryder  Delice Bison, NP 10/12/20 4:04 PM Medical Oncology and Hematology [facility] [place], [place]  [address] Tel. [phone]    Fax. [phone]  *Total Encounter Time as defined by the Centers for Medicare and Medicaid Services includes, in addition to the face-to-face time of a patient visit (documented in the note above) non-face-to-face time: obtaining and reviewing outside history, ordering and reviewing medications, tests or procedures, care coordination (communications with other health care professionals or caregivers) and documentation in the medical record.

## 2020-10-17 DIAGNOSIS — I10 Essential (primary) hypertension: Secondary | ICD-10-CM | POA: Diagnosis not present

## 2020-10-17 DIAGNOSIS — C50511 Malignant neoplasm of lower-outer quadrant of right female breast: Secondary | ICD-10-CM | POA: Diagnosis not present

## 2020-10-17 DIAGNOSIS — I509 Heart failure, unspecified: Secondary | ICD-10-CM | POA: Diagnosis not present

## 2020-10-17 DIAGNOSIS — E1169 Type 2 diabetes mellitus with other specified complication: Secondary | ICD-10-CM | POA: Diagnosis not present

## 2020-10-17 DIAGNOSIS — E785 Hyperlipidemia, unspecified: Secondary | ICD-10-CM | POA: Diagnosis not present

## 2020-10-19 ENCOUNTER — Inpatient Hospital Stay: Payer: HMO

## 2020-10-19 ENCOUNTER — Inpatient Hospital Stay: Payer: HMO | Attending: Hematology and Oncology

## 2020-10-19 ENCOUNTER — Other Ambulatory Visit: Payer: Self-pay

## 2020-10-19 ENCOUNTER — Other Ambulatory Visit: Payer: Self-pay | Admitting: Oncology

## 2020-10-19 ENCOUNTER — Ambulatory Visit: Payer: Self-pay

## 2020-10-19 VITALS — BP 106/69 | HR 85 | Temp 98.6°F | Resp 18 | Ht 65.0 in | Wt 152.5 lb

## 2020-10-19 DIAGNOSIS — Z170 Estrogen receptor positive status [ER+]: Secondary | ICD-10-CM | POA: Diagnosis not present

## 2020-10-19 DIAGNOSIS — T451X5A Adverse effect of antineoplastic and immunosuppressive drugs, initial encounter: Secondary | ICD-10-CM | POA: Diagnosis not present

## 2020-10-19 DIAGNOSIS — C50511 Malignant neoplasm of lower-outer quadrant of right female breast: Secondary | ICD-10-CM

## 2020-10-19 DIAGNOSIS — G620 Drug-induced polyneuropathy: Secondary | ICD-10-CM | POA: Insufficient documentation

## 2020-10-19 DIAGNOSIS — Z5111 Encounter for antineoplastic chemotherapy: Secondary | ICD-10-CM | POA: Diagnosis not present

## 2020-10-19 DIAGNOSIS — Z23 Encounter for immunization: Secondary | ICD-10-CM | POA: Diagnosis not present

## 2020-10-19 DIAGNOSIS — I427 Cardiomyopathy due to drug and external agent: Secondary | ICD-10-CM | POA: Insufficient documentation

## 2020-10-19 LAB — CBC WITH DIFFERENTIAL (CANCER CENTER ONLY)
Abs Immature Granulocytes: 0.03 10*3/uL (ref 0.00–0.07)
Basophils Absolute: 0 10*3/uL (ref 0.0–0.1)
Basophils Relative: 1 %
Eosinophils Absolute: 0.1 10*3/uL (ref 0.0–0.5)
Eosinophils Relative: 2 %
HCT: 38.2 % (ref 36.0–46.0)
Hemoglobin: 12.4 g/dL (ref 12.0–15.0)
Immature Granulocytes: 1 %
Lymphocytes Relative: 35 %
Lymphs Abs: 1.4 10*3/uL (ref 0.7–4.0)
MCH: 29.7 pg (ref 26.0–34.0)
MCHC: 32.5 g/dL (ref 30.0–36.0)
MCV: 91.4 fL (ref 80.0–100.0)
Monocytes Absolute: 0.3 10*3/uL (ref 0.1–1.0)
Monocytes Relative: 7 %
Neutro Abs: 2.2 10*3/uL (ref 1.7–7.7)
Neutrophils Relative %: 54 %
Platelet Count: 313 10*3/uL (ref 150–400)
RBC: 4.18 MIL/uL (ref 3.87–5.11)
RDW: 13.9 % (ref 11.5–15.5)
WBC Count: 4.1 10*3/uL (ref 4.0–10.5)
nRBC: 0 % (ref 0.0–0.2)

## 2020-10-19 LAB — CMP (CANCER CENTER ONLY)
ALT: 17 U/L (ref 0–44)
AST: 18 U/L (ref 15–41)
Albumin: 3.8 g/dL (ref 3.5–5.0)
Alkaline Phosphatase: 48 U/L (ref 38–126)
Anion gap: 7 (ref 5–15)
BUN: 13 mg/dL (ref 8–23)
CO2: 26 mmol/L (ref 22–32)
Calcium: 10 mg/dL (ref 8.9–10.3)
Chloride: 107 mmol/L (ref 98–111)
Creatinine: 0.74 mg/dL (ref 0.44–1.00)
GFR, Estimated: >60 mL/min (ref >60)
Glucose, Bld: 118 mg/dL — ABNORMAL HIGH (ref 70–99)
Potassium: 3.8 mmol/L (ref 3.5–5.1)
Sodium: 140 mmol/L (ref 135–145)
Total Bilirubin: 0.4 mg/dL (ref 0.3–1.2)
Total Protein: 7.3 g/dL (ref 6.5–8.1)

## 2020-10-19 MED ORDER — SODIUM CHLORIDE 0.9 % IV SOLN
80.0000 mg/m2 | Freq: Once | INTRAVENOUS | Status: AC
  Administered 2020-10-19: 144 mg via INTRAVENOUS
  Filled 2020-10-19: qty 24

## 2020-10-19 MED ORDER — DIPHENHYDRAMINE HCL 50 MG/ML IJ SOLN
INTRAMUSCULAR | Status: AC
  Filled 2020-10-19: qty 1

## 2020-10-19 MED ORDER — HEPARIN SOD (PORK) LOCK FLUSH 100 UNIT/ML IV SOLN
500.0000 [IU] | Freq: Once | INTRAVENOUS | Status: AC | PRN
  Administered 2020-10-19: 500 [IU]
  Filled 2020-10-19: qty 5

## 2020-10-19 MED ORDER — FAMOTIDINE IN NACL 20-0.9 MG/50ML-% IV SOLN
20.0000 mg | Freq: Once | INTRAVENOUS | Status: AC
  Administered 2020-10-19: 20 mg via INTRAVENOUS

## 2020-10-19 MED ORDER — DEXAMETHASONE SODIUM PHOSPHATE 10 MG/ML IJ SOLN
4.0000 mg | Freq: Once | INTRAMUSCULAR | Status: AC
  Administered 2020-10-19: 4 mg via INTRAVENOUS

## 2020-10-19 MED ORDER — FAMOTIDINE IN NACL 20-0.9 MG/50ML-% IV SOLN
INTRAVENOUS | Status: AC
  Filled 2020-10-19: qty 50

## 2020-10-19 MED ORDER — DEXAMETHASONE SODIUM PHOSPHATE 10 MG/ML IJ SOLN
INTRAMUSCULAR | Status: AC
  Filled 2020-10-19: qty 1

## 2020-10-19 MED ORDER — SODIUM CHLORIDE 0.9% FLUSH
10.0000 mL | INTRAVENOUS | Status: DC | PRN
  Administered 2020-10-19: 10 mL
  Filled 2020-10-19: qty 10

## 2020-10-19 MED ORDER — DIPHENHYDRAMINE HCL 50 MG/ML IJ SOLN
25.0000 mg | Freq: Once | INTRAMUSCULAR | Status: AC
  Administered 2020-10-19: 25 mg via INTRAVENOUS

## 2020-10-19 MED ORDER — SODIUM CHLORIDE 0.9 % IV SOLN
Freq: Once | INTRAVENOUS | Status: AC
  Filled 2020-10-19: qty 250

## 2020-10-19 NOTE — Patient Instructions (Signed)
[facility] Discharge Instructions for Patients Receiving Chemotherapy  Today you received the following chemotherapy agents Paclitaxel (TAXOL).  To help prevent nausea and vomiting after your treatment, we encourage you to take your nausea medication as prescribed.  If you develop nausea and vomiting that is not controlled by your nausea medication, call the clinic.   BELOW ARE SYMPTOMS THAT SHOULD BE REPORTED IMMEDIATELY:  *FEVER GREATER THAN 100.5 F  *CHILLS WITH OR WITHOUT FEVER  NAUSEA AND VOMITING THAT IS NOT CONTROLLED WITH YOUR NAUSEA MEDICATION  *UNUSUAL SHORTNESS OF BREATH  *UNUSUAL BRUISING OR BLEEDING  TENDERNESS IN MOUTH AND THROAT WITH OR WITHOUT PRESENCE OF ULCERS  *URINARY PROBLEMS  *BOWEL PROBLEMS  UNUSUAL RASH Items with * indicate a potential emergency and should be followed up as soon as possible.  Feel free to call the clinic should you have any questions or concerns. The clinic phone number is [phone].  Please show the CHEMO ALERT CARD at check-in to the Emergency Department and triage nurse.   

## 2020-10-21 ENCOUNTER — Other Ambulatory Visit: Payer: Self-pay

## 2020-10-21 NOTE — Patient Outreach (Signed)
  [facility]) Care Management Chronic Special Needs Program    10/21/2020  Name: Toni [name], DOB: Mar 28, 1956  MRN: [id number]   [name] is enrolled in a chronic special needs plan for Diabetes. Telephone call to client for follow up. Client states she is busy and request return call at another time.   PLAN;  RNCM will attempt 2nd telephone follow up with client in 3 weeks.    Quinn Plowman RN,BSN,CCM [facility] Care Management [phone]

## 2020-10-27 ENCOUNTER — Other Ambulatory Visit: Payer: Self-pay

## 2020-10-27 ENCOUNTER — Inpatient Hospital Stay: Payer: HMO

## 2020-10-27 ENCOUNTER — Encounter: Payer: Self-pay | Admitting: *Deleted

## 2020-10-27 VITALS — BP 115/70 | HR 81 | Temp 98.3°F | Resp 17 | Wt 152.0 lb

## 2020-10-27 DIAGNOSIS — C50511 Malignant neoplasm of lower-outer quadrant of right female breast: Secondary | ICD-10-CM

## 2020-10-27 DIAGNOSIS — Z95828 Presence of other vascular implants and grafts: Secondary | ICD-10-CM

## 2020-10-27 DIAGNOSIS — Z5111 Encounter for antineoplastic chemotherapy: Secondary | ICD-10-CM | POA: Diagnosis not present

## 2020-10-27 DIAGNOSIS — Z170 Estrogen receptor positive status [ER+]: Secondary | ICD-10-CM

## 2020-10-27 LAB — CBC WITH DIFFERENTIAL (CANCER CENTER ONLY)
Abs Immature Granulocytes: 0.04 10*3/uL (ref 0.00–0.07)
Basophils Absolute: 0 10*3/uL (ref 0.0–0.1)
Basophils Relative: 0 %
Eosinophils Absolute: 0.1 10*3/uL (ref 0.0–0.5)
Eosinophils Relative: 2 %
HCT: 37.8 % (ref 36.0–46.0)
Hemoglobin: 12 g/dL (ref 12.0–15.0)
Immature Granulocytes: 1 %
Lymphocytes Relative: 31 %
Lymphs Abs: 1.6 10*3/uL (ref 0.7–4.0)
MCH: 29.2 pg (ref 26.0–34.0)
MCHC: 31.7 g/dL (ref 30.0–36.0)
MCV: 92 fL (ref 80.0–100.0)
Monocytes Absolute: 0.4 10*3/uL (ref 0.1–1.0)
Monocytes Relative: 8 %
Neutro Abs: 2.9 10*3/uL (ref 1.7–7.7)
Neutrophils Relative %: 58 %
Platelet Count: 300 10*3/uL (ref 150–400)
RBC: 4.11 MIL/uL (ref 3.87–5.11)
RDW: 14.6 % (ref 11.5–15.5)
WBC Count: 5 10*3/uL (ref 4.0–10.5)
nRBC: 0 % (ref 0.0–0.2)

## 2020-10-27 LAB — CMP (CANCER CENTER ONLY)
ALT: 16 U/L (ref 0–44)
AST: 19 U/L (ref 15–41)
Albumin: 3.6 g/dL (ref 3.5–5.0)
Alkaline Phosphatase: 47 U/L (ref 38–126)
Anion gap: 8 (ref 5–15)
BUN: 15 mg/dL (ref 8–23)
CO2: 24 mmol/L (ref 22–32)
Calcium: 9.8 mg/dL (ref 8.9–10.3)
Chloride: 109 mmol/L (ref 98–111)
Creatinine: 0.78 mg/dL (ref 0.44–1.00)
GFR, Estimated: >60 mL/min (ref >60)
Glucose, Bld: 126 mg/dL — ABNORMAL HIGH (ref 70–99)
Potassium: 4.1 mmol/L (ref 3.5–5.1)
Sodium: 141 mmol/L (ref 135–145)
Total Bilirubin: 0.4 mg/dL (ref 0.3–1.2)
Total Protein: 7.2 g/dL (ref 6.5–8.1)

## 2020-10-27 MED ORDER — ACETAMINOPHEN 325 MG PO TABS
ORAL_TABLET | ORAL | Status: AC
  Filled 2020-10-27: qty 2

## 2020-10-27 MED ORDER — DEXAMETHASONE SODIUM PHOSPHATE 10 MG/ML IJ SOLN
INTRAMUSCULAR | Status: AC
  Filled 2020-10-27: qty 1

## 2020-10-27 MED ORDER — SODIUM CHLORIDE 0.9% FLUSH
10.0000 mL | INTRAVENOUS | Status: DC | PRN
  Administered 2020-10-27: 10 mL
  Filled 2020-10-27: qty 10

## 2020-10-27 MED ORDER — DIPHENHYDRAMINE HCL 50 MG/ML IJ SOLN
INTRAMUSCULAR | Status: AC
  Filled 2020-10-27: qty 1

## 2020-10-27 MED ORDER — SODIUM CHLORIDE 0.9 % IV SOLN
Freq: Once | INTRAVENOUS | Status: AC
  Filled 2020-10-27: qty 250

## 2020-10-27 MED ORDER — DEXAMETHASONE SODIUM PHOSPHATE 10 MG/ML IJ SOLN
4.0000 mg | Freq: Once | INTRAMUSCULAR | Status: AC
  Administered 2020-10-27: 4 mg via INTRAVENOUS

## 2020-10-27 MED ORDER — FAMOTIDINE IN NACL 20-0.9 MG/50ML-% IV SOLN
20.0000 mg | Freq: Once | INTRAVENOUS | Status: AC
  Administered 2020-10-27: 20 mg via INTRAVENOUS

## 2020-10-27 MED ORDER — SODIUM CHLORIDE 0.9% FLUSH
10.0000 mL | Freq: Once | INTRAVENOUS | Status: AC
  Administered 2020-10-27: 10 mL
  Filled 2020-10-27: qty 10

## 2020-10-27 MED ORDER — HEPARIN SOD (PORK) LOCK FLUSH 100 UNIT/ML IV SOLN
500.0000 [IU] | Freq: Once | INTRAVENOUS | Status: AC | PRN
  Administered 2020-10-27: 500 [IU]
  Filled 2020-10-27: qty 5

## 2020-10-27 MED ORDER — FAMOTIDINE IN NACL 20-0.9 MG/50ML-% IV SOLN
INTRAVENOUS | Status: AC
  Filled 2020-10-27: qty 50

## 2020-10-27 MED ORDER — ACETAMINOPHEN 325 MG PO TABS
650.0000 mg | ORAL_TABLET | Freq: Once | ORAL | Status: AC
  Administered 2020-10-27: 650 mg via ORAL

## 2020-10-27 MED ORDER — SODIUM CHLORIDE 0.9 % IV SOLN
80.0000 mg/m2 | Freq: Once | INTRAVENOUS | Status: AC
  Administered 2020-10-27: 144 mg via INTRAVENOUS
  Filled 2020-10-27: qty 24

## 2020-10-27 MED ORDER — DIPHENHYDRAMINE HCL 50 MG/ML IJ SOLN
25.0000 mg | Freq: Once | INTRAMUSCULAR | Status: AC
  Administered 2020-10-27: 25 mg via INTRAVENOUS

## 2020-10-28 ENCOUNTER — Other Ambulatory Visit: Payer: Self-pay

## 2020-10-28 NOTE — Patient Outreach (Addendum)
[facility]) Care Management Chronic Special Needs Program  10/28/2020  Name: Toni [name] DOB: 1956-04-24  MRN: [id number]  [name] is enrolled in a chronic special needs plan for Diabetes.  Telephone call to client for CSNP assessment follow up.  HIPAA verified by client. Client states she is doing well. She reports she is managing her health conditions.  Client reports she is now using the Free style libre for blood glucose monitoring. She states her doctor adjusted her diabetic medications recently. Client denies having hypoglycemic episodes. Client reports regular follow ups with her providers and pharmacist. She reports taking her medications as prescribed.   Goals Addressed              This Visit's Progress   .  COMPLETED:  Acknowledge receipt of Marine scientist received    .  "would like to bring my A1c down" (pt-stated)   On track     Check your blood sugars daily before eating wth a goal of 80-130.   You can also check 1 1/2 hours after eating with goal of 180 or less.  Plan to eat low carbohydrate and low salt meals, watch portion sizes and avoid sugar sweetened drinks.  Increase exercise only if you are able to.  Discuss exercise options with your provider.     .  Client understands the importance of follow-up with providers by attending scheduled visits   On track     Client reports adherence to provider appointments. Primary care provider visit June 2021.  Client reports upcoming appointment with primary care provider is scheduled for 11/01/20.  Continue to maintain and keep follow up visits with your providers.     .  Client verbalize knowledge of Heart Failure disease self management skills in 6 months   On track     Signs and symptoms of heart failure discussed. Client states understanding.   Continue to weigh daily and record weights.  Continue to take your medication as prescribed.  Follow a low salt meal  plan, Read food labels for the sodium (salt) content. Signs     .  COMPLETED: Client will report checking her blood sugar at least once every day in 3 months        Client reports she is now using the Free style libre.  Client reports checking her blood sugars at least 4-5 times per day.     .  COMPLETED: Client will report no worsening of symptoms related to heart disease within the next 26months        Client denies any symptoms related to heart disease.  Client reports having a 2 D Echo 2 weeks ago.     .  Client will verbalize knowledge of self management of Hypertension as evidences by BP reading of 140/90 or less; or as defined by provider   On track     Continue to take your blood pressure medications as prescribed.  Continue to follow a low salt / heart healthy diet.  Exercise if you are to.  Discuss exercise plan with your provider.  Check your blood pressure at least once a week. Notify your doctor if you have concerns regarding your blood pressure.       .  COMPLETED: Client will verbalize signs/ symptoms of infection post surgical discharge in 3 months        Client denies having any signs / symptoms of infection after  surgical insertion of porta cath.     Marland Kitchen  HEMOGLOBIN A1C < 7        .Re-Discussed diabetes self management actions.  Continue to follow:  Glucose monitoring per provider recommendation  Check feet daily  Visit provider every 3-6 months as directed  Hbg A1C level every 3-6 months.  Eye Exam yearly  Carbohydrate controlled meal planning  Taking diabetes medication as prescribed by provider  Physical activity     .  Maintain timely refills of diabetic medication as prescribed within the year .   On track     Continue to work with the pharmacist at your provider practice as recommended.  Continue to take your medications as prescribed.  Call your provider office or RN case manager if you have questions.     .  Obtain Annual Foot Exam   Not on track      Unable to determine your most recent foot exam date. Discuss having an annual foot exam with your doctor at your next office visit.    Diabetes foot care - Check feet daily at home (look for skin color changes, cuts, sores or cracks in the skin, swelling of feet or ankles, ingrown or fungal toenails, corn or calluses). Report these findings to your doctor - Wash feet with soap and water, dry feet well especially between toes - Moisturize your feet but not between the toes - Always wear shoes that protect your whole feet.      .  Visit Primary Care Provider or Endocrinologist at least 2 times per year         Client reports adherence to provider appointments. Primary care provider visit June 2021.  Client reports upcoming appointment with primary care provider is scheduled for 11/01/20.  Continue to maintain and keep follow up visits with your providers.       Assessment:  Client continues home self management/ monitoring. Denies any ED visit or hospitalizations.  Current Hgb A1c 9.3.  Client denies recent hypoglycemic events. Client reports meeting periodically with provider office pharmacist. Taking her medications as prescribed.  Clients CSNP tier level will be changed to Tier 2 .  Plan:  Send successful outreach letter with a copy of their individualized care plan and Send individual care plan to provider  Chronic care management coordinator will outreach in:  6 Months   Quinn Plowman RN,BSN,CCM [facility] Management [phone]

## 2020-11-01 DIAGNOSIS — Z1389 Encounter for screening for other disorder: Secondary | ICD-10-CM | POA: Diagnosis not present

## 2020-11-01 DIAGNOSIS — E785 Hyperlipidemia, unspecified: Secondary | ICD-10-CM | POA: Diagnosis not present

## 2020-11-01 DIAGNOSIS — I10 Essential (primary) hypertension: Secondary | ICD-10-CM | POA: Diagnosis not present

## 2020-11-01 DIAGNOSIS — E1169 Type 2 diabetes mellitus with other specified complication: Secondary | ICD-10-CM | POA: Diagnosis not present

## 2020-11-01 DIAGNOSIS — Z23 Encounter for immunization: Secondary | ICD-10-CM | POA: Diagnosis not present

## 2020-11-01 DIAGNOSIS — Z5320 Procedure and treatment not carried out because of patient's decision for unspecified reasons: Secondary | ICD-10-CM | POA: Diagnosis not present

## 2020-11-01 DIAGNOSIS — Z7984 Long term (current) use of oral hypoglycemic drugs: Secondary | ICD-10-CM | POA: Diagnosis not present

## 2020-11-02 NOTE — Progress Notes (Signed)
Patient Care Team: Donald Prose, MD as PCP - General (Family Medicine) Belva Crome, MD as PCP - Cardiology (Cardiology) Larey Dresser, MD as PCP - Advanced Heart Failure (Cardiology) Dannielle Karvonen, RN as [place] Management Tressie Ellis, Paulette Blanch, RN as Registered Nurse Rockwell Germany, RN as Oncology Nurse Navigator Coralie Keens, MD as Consulting Physician (General Surgery) Nicholas Lose, MD as Consulting Physician (Hematology and Oncology) Kyung Rudd, MD as Consulting Physician (Radiation Oncology)  DIAGNOSIS:    ICD-10-CM   1. Malignant neoplasm of lower-outer quadrant of right breast of female, estrogen receptor positive ([place])  C50.511    Z17.0     SUMMARY OF ONCOLOGIC HISTORY: Oncology History  Malignant neoplasm of lower-outer quadrant of right breast of female, estrogen receptor positive ([place])  07/01/2020 Initial Diagnosis   Screening mammogram showed a right breast mass. Mammogram and US showed a 0.7cm mass at the 6 o'clock position in the right breast, no axillary adenopathy. 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    07/06/2020 Cancer Staging   Staging form: Breast, AJCC 8th Edition - Clinical stage from 07/06/2020: Stage IB (cT1b, cN0, cM0, G3, ER+, PR-, HER2-) - Signed by Nicholas Lose, MD on 07/06/2020   07/26/2020 Genetic Testing   Negative genetic testing:  No pathogenic variants detected on the [facility] Common Hereditary Cancers Panel. The report date is 07/26/2020.   The Common Hereditary Cancers Panel offered by [facility] includes sequencing and/or deletion duplication testing of the following 48 genes: APC, ATM, AXIN2, BARD1, BMPR1A, BRCA1, BRCA2, BRIP1, CDH1, CDK4, CDKN2A (p14ARF), CDKN2A (p16INK4a), CHEK2, CTNNA1, DICER1, EPCAM (Deletion/duplication testing only), GREM1 (promoter region deletion/duplication testing only), KIT, MEN1, MLH1, MSH2, MSH3, MSH6,  MUTYH, NBN, NF1, NTHL1, PALB2, PDGFRA, PMS2, POLD1, POLE, PTEN, RAD50, RAD51C, RAD51D, RNF43, SDHB, SDHC, SDHD, SMAD4, SMARCA4. STK11, TP53, TSC1, TSC2, and VHL.  The following genes were evaluated for sequence changes only: SDHA and HOXB13 c.251G>A variant only.   08/11/2020 Surgery   Right lumpectomy [name]): IDC, grade 3, 0.8cm, clear margins, 3 right axillary lymph nodes negative for carcinoma. ER 30% weak, PR 0%, HER-2 positive, Ki-67 40%   08/18/2020 Cancer Staging   Staging form: Breast, AJCC 8th Edition - Pathologic stage from 08/18/2020: Stage IA (pT1b, pN0, cM0, G3, ER+, PR-, HER2+) - Signed by Gardenia Phlegm, NP on 10/12/2020   09/08/2020 -  Adjuvant Chemotherapy   Weekly Taxol/herceptin x 12, followed by herceptin maintenance x 1 year; Herceptin stopped after first 5 weekly cycles due to decline in EF from 40% to 30%.       CHIEF COMPLIANT: Cycle 9 Taxol   INTERVAL HISTORY: Toni [name] is a 64 y.o. with above-mentioned history of right breast cancerwhounderwent a right lumpectomyand is currently on adjuvant chemotherapy with Taxol, as Herceptin was discontinued after an Echo showed a decreased ejection fraction.She presents to the clinic todayfor cycle 9.  Her major complaint today is severe fatigue.  She has been sleeping all day and all night.  She denies any nausea or vomiting.  She has mild numbness of the fingers which has not gotten any worse.  ALLERGIES:  is allergic to biaxin [clarithromycin], enalapril maleate, and vasotec [enalapril].  MEDICATIONS:  Current Outpatient Medications  Medication Sig Dispense Refill  . aspirin 81 MG chewable tablet Chew 1 tablet (81 mg total) by mouth daily. 30 tablet 0  . carvedilol (COREG) 25 MG tablet  TAKE 1 TABLET (25 MG TOTAL) BY MOUTH 2 (TWO) TIMES DAILY WITH A MEAL. 180 tablet 3  . cetirizine (ZYRTEC) 10 MG tablet Take 10 mg by mouth daily.     . Continuous Blood Gluc Receiver (FREESTYLE LIBRE 14 DAY READER)  DEVI Apply topically as directed.    . Continuous Blood Gluc Sensor (FREESTYLE LIBRE 14 DAY SENSOR) MISC Apply topically as directed.    . digoxin (LANOXIN) 0.125 MG tablet Take 0.5 tablets (0.0625 mg total) by mouth daily. 45 tablet 1  . furosemide (LASIX) 20 MG tablet Take 20 mg by mouth daily as needed for fluid or edema.     Marland Kitchen glipiZIDE (GLUCOTROL XL) 10 MG 24 hr tablet Take 20 mg by mouth daily with breakfast.     . hydrALAZINE (APRESOLINE) 50 MG tablet TAKE 1 TABLET (50 MG TOTAL) BY MOUTH 3 (THREE) TIMES DAILY. 270 tablet 1  . insulin glargine (LANTUS) 100 UNIT/ML injection Inject 12 Units into the skin daily.    . isosorbide dinitrate (ISORDIL) 20 MG tablet TAKE 1 TABLET (20 MG TOTAL) BY MOUTH 3 TIMES DAILY. 90 tablet 11  . JARDIANCE 25 MG TABS tablet Take 25 mg by mouth daily.    Marland Kitchen lidocaine-prilocaine (EMLA) cream Apply to affected area once 30 g 3  . LINZESS 145 MCG CAPS capsule Take 145 mcg by mouth daily as needed (constipation). Constipation    . losartan (COZAAR) 50 MG tablet Take 1 tablet (50 mg total) by mouth in the morning and at bedtime. 60 tablet 6  . metFORMIN (GLUCOPHAGE) 1000 MG tablet Take 1,000 mg by mouth 2 (two) times daily with a meal.    . Multiple Vitamin (MULTIVITAMIN WITH MINERALS) TABS tablet Take 1 tablet by mouth daily. Centrum Silver    . ondansetron (ZOFRAN) 8 MG tablet Take 1 tablet (8 mg total) by mouth 2 (two) times daily as needed (Nausea or vomiting). 30 tablet 1  . OZEMPIC, 1 MG/DOSE, 4 MG/3ML SOPN Inject 1 mg into the skin every Saturday.     . prochlorperazine (COMPAZINE) 10 MG tablet Take 1 tablet (10 mg total) by mouth every 6 (six) hours as needed (Nausea or vomiting). 30 tablet 1  . simvastatin (ZOCOR) 20 MG tablet Take 20 mg by mouth at bedtime.     Marland Kitchen spironolactone (ALDACTONE) 25 MG tablet TAKE 1 TABLET BY MOUTH EVERY EVENING. 90 tablet 3  . traMADol (ULTRAM) 50 MG tablet Take 1-2 tablets (50-100 mg total) by mouth every 6 (six) hours as needed  for moderate pain. 20 tablet 0  . TRUE METRIX BLOOD GLUCOSE TEST test strip 1 each by Other route as directed.   5  . TRUEPLUS LANCETS 30G MISC 1 each by Other route as directed. Use as directed.  5  . UNIFINE PENTIPS 32G X 4 MM MISC 1 each by Other route as directed.      No current facility-administered medications for this visit.    PHYSICAL EXAMINATION: ECOG PERFORMANCE STATUS: 1 - Symptomatic but completely ambulatory  Vitals:   11/03/20 1406  BP: 131/79  Pulse: 100  Resp: 18  Temp: 98.8 F (37.1 C)  SpO2: 98%   Filed Weights   11/03/20 1406  Weight: 156 lb 11.2 oz (71.1 kg)    LABORATORY DATA:  I have reviewed the data as listed CMP Latest Ref Rng & Units 10/27/2020 10/19/2020 10/12/2020  Glucose 70 - 99 mg/dL 126(H) 118(H) 133(H)  BUN 8 - 23 mg/dL 15 13 15  Creatinine 0.44 - 1.00 mg/dL 0.78 0.74 0.82  Sodium 135 - 145 mmol/L 141 140 138  Potassium 3.5 - 5.1 mmol/L 4.1 3.8 4.0  Chloride 98 - 111 mmol/L 109 107 105  CO2 22 - 32 mmol/L _0 Calcium 8.9 - 10.3 mg/dL 9.8 10.0 9.3  Total Protein 6.5 - 8.1 g/dL 7.2 7.3 7.3  Total Bilirubin 0.3 - 1.2 mg/dL 0.4 0.4 0.3  Alkaline Phos 38 - 126 U/L 47 48 50  AST 15 - 41 U/L _1 ALT 0 - 44 U/L _2 Lab Results  Component Value Date   WBC 5.2 11/03/2020   HGB 12.3 11/03/2020   HCT 38.1 11/03/2020   MCV 91.4 11/03/2020   PLT 330 11/03/2020   NEUTROABS 2.5 11/03/2020    ASSESSMENT & PLAN:  Malignant neoplasm of lower-outer quadrant of right breast of female, estrogen receptor positive ([place]) 07/01/2020:Screening mammogram showed a right breast mass. Mammogram and US showed a 0.7cm mass at the 6 o'clock position in the right breast, no axillary adenopathy. 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 T1BN0 stage Ia  08/11/2020:Right lumpectomy [name]): IDC, grade 3, 0.8cm, clear margins, 3 right axillary lymph nodes  negative for carcinoma.ER 30% week, PR 0%, HER-2 positive, Ki-67 40%  Treatment plan: 1.adjuvant Taxol Herceptin/Herceptin discontinued after 5 cycles due to worsening cardiomyopathy 2.Adjuvant radiation therapy 3.Follow-up adjuvant antiestrogen therapy Patient is participating in the neuropathy clinical trial SWOG S1714 --------------------------------------------------------------------------------------------------------------------------------------- Current treatment: Cycle 9 day 1 of Taxol  She has a grade 1 peripheral sensory neuropathy.  This is intermittent and brief. She is otherwise tolerating her weekly Taxol well and will continue this. She is scheduled to undergo echocardiogram on 12/20/2020. If her cardiac function improves and we can resume Herceptin maintenance every 3 weeks after the test.   No orders of the defined types were placed in this encounter.  The patient has a good understanding of the overall plan. she agrees with it. she will call with any problems that may develop before the next visit here.  Total time spent: 30 mins including face to face time and time spent for planning, charting and coordination of care  Nicholas Lose, MD 11/03/2020  I, Cloyde Reams Dorshimer, am acting as scribe for Dr. Nicholas Lose.  I have reviewed the above documentation for accuracy and completeness, and I agree with the above.

## 2020-11-03 ENCOUNTER — Inpatient Hospital Stay (HOSPITAL_BASED_OUTPATIENT_CLINIC_OR_DEPARTMENT_OTHER): Payer: HMO | Admitting: Hematology and Oncology

## 2020-11-03 ENCOUNTER — Inpatient Hospital Stay: Payer: HMO

## 2020-11-03 ENCOUNTER — Other Ambulatory Visit: Payer: Self-pay

## 2020-11-03 ENCOUNTER — Encounter: Payer: Self-pay | Admitting: Medical Oncology

## 2020-11-03 VITALS — HR 89

## 2020-11-03 DIAGNOSIS — Z006 Encounter for examination for normal comparison and control in clinical research program: Secondary | ICD-10-CM

## 2020-11-03 DIAGNOSIS — C50511 Malignant neoplasm of lower-outer quadrant of right female breast: Secondary | ICD-10-CM

## 2020-11-03 DIAGNOSIS — Z170 Estrogen receptor positive status [ER+]: Secondary | ICD-10-CM

## 2020-11-03 DIAGNOSIS — I427 Cardiomyopathy due to drug and external agent: Secondary | ICD-10-CM | POA: Diagnosis not present

## 2020-11-03 DIAGNOSIS — G620 Drug-induced polyneuropathy: Secondary | ICD-10-CM

## 2020-11-03 DIAGNOSIS — Z5111 Encounter for antineoplastic chemotherapy: Secondary | ICD-10-CM | POA: Diagnosis not present

## 2020-11-03 DIAGNOSIS — Z95828 Presence of other vascular implants and grafts: Secondary | ICD-10-CM

## 2020-11-03 LAB — CBC WITH DIFFERENTIAL (CANCER CENTER ONLY)
Abs Immature Granulocytes: 0.04 10*3/uL (ref 0.00–0.07)
Basophils Absolute: 0 10*3/uL (ref 0.0–0.1)
Basophils Relative: 1 %
Eosinophils Absolute: 0.1 10*3/uL (ref 0.0–0.5)
Eosinophils Relative: 2 %
HCT: 38.1 % (ref 36.0–46.0)
Hemoglobin: 12.3 g/dL (ref 12.0–15.0)
Immature Granulocytes: 1 %
Lymphocytes Relative: 39 %
Lymphs Abs: 2 10*3/uL (ref 0.7–4.0)
MCH: 29.5 pg (ref 26.0–34.0)
MCHC: 32.3 g/dL (ref 30.0–36.0)
MCV: 91.4 fL (ref 80.0–100.0)
Monocytes Absolute: 0.5 10*3/uL (ref 0.1–1.0)
Monocytes Relative: 10 %
Neutro Abs: 2.5 10*3/uL (ref 1.7–7.7)
Neutrophils Relative %: 47 %
Platelet Count: 330 10*3/uL (ref 150–400)
RBC: 4.17 MIL/uL (ref 3.87–5.11)
RDW: 15 % (ref 11.5–15.5)
WBC Count: 5.2 10*3/uL (ref 4.0–10.5)
nRBC: 0 % (ref 0.0–0.2)

## 2020-11-03 LAB — CMP (CANCER CENTER ONLY)
ALT: 21 U/L (ref 0–44)
AST: 19 U/L (ref 15–41)
Albumin: 3.6 g/dL (ref 3.5–5.0)
Alkaline Phosphatase: 50 U/L (ref 38–126)
Anion gap: 8 (ref 5–15)
BUN: 9 mg/dL (ref 8–23)
CO2: 29 mmol/L (ref 22–32)
Calcium: 10 mg/dL (ref 8.9–10.3)
Chloride: 106 mmol/L (ref 98–111)
Creatinine: 0.8 mg/dL (ref 0.44–1.00)
GFR, Estimated: >60 mL/min (ref >60)
Glucose, Bld: 114 mg/dL — ABNORMAL HIGH (ref 70–99)
Potassium: 3.8 mmol/L (ref 3.5–5.1)
Sodium: 143 mmol/L (ref 135–145)
Total Bilirubin: 0.3 mg/dL (ref 0.3–1.2)
Total Protein: 7.3 g/dL (ref 6.5–8.1)

## 2020-11-03 MED ORDER — FAMOTIDINE IN NACL 20-0.9 MG/50ML-% IV SOLN
20.0000 mg | Freq: Once | INTRAVENOUS | Status: AC
  Administered 2020-11-03: 20 mg via INTRAVENOUS

## 2020-11-03 MED ORDER — SODIUM CHLORIDE 0.9 % IV SOLN
80.0000 mg/m2 | Freq: Once | INTRAVENOUS | Status: AC
  Administered 2020-11-03: 144 mg via INTRAVENOUS
  Filled 2020-11-03: qty 24

## 2020-11-03 MED ORDER — SODIUM CHLORIDE 0.9% FLUSH
10.0000 mL | INTRAVENOUS | Status: DC | PRN
  Administered 2020-11-03: 10 mL
  Filled 2020-11-03: qty 10

## 2020-11-03 MED ORDER — SODIUM CHLORIDE 0.9 % IV SOLN
Freq: Once | INTRAVENOUS | Status: AC
  Filled 2020-11-03: qty 250

## 2020-11-03 MED ORDER — FAMOTIDINE IN NACL 20-0.9 MG/50ML-% IV SOLN
INTRAVENOUS | Status: AC
  Filled 2020-11-03: qty 50

## 2020-11-03 MED ORDER — DEXAMETHASONE SODIUM PHOSPHATE 10 MG/ML IJ SOLN
4.0000 mg | Freq: Once | INTRAMUSCULAR | Status: AC
Start: 2020-11-03 — End: 2020-11-03
  Administered 2020-11-03: 4 mg via INTRAVENOUS

## 2020-11-03 MED ORDER — SODIUM CHLORIDE 0.9% FLUSH
10.0000 mL | Freq: Once | INTRAVENOUS | Status: AC
  Administered 2020-11-03: 10 mL
  Filled 2020-11-03: qty 10

## 2020-11-03 MED ORDER — HEPARIN SOD (PORK) LOCK FLUSH 100 UNIT/ML IV SOLN
500.0000 [IU] | Freq: Once | INTRAVENOUS | Status: AC | PRN
  Administered 2020-11-03: 500 [IU]
  Filled 2020-11-03: qty 5

## 2020-11-03 MED ORDER — DIPHENHYDRAMINE HCL 50 MG/ML IJ SOLN
INTRAMUSCULAR | Status: AC
  Filled 2020-11-03: qty 1

## 2020-11-03 MED ORDER — DEXAMETHASONE SODIUM PHOSPHATE 10 MG/ML IJ SOLN
INTRAMUSCULAR | Status: AC
  Filled 2020-11-03: qty 1

## 2020-11-03 MED ORDER — DIPHENHYDRAMINE HCL 50 MG/ML IJ SOLN
25.0000 mg | Freq: Once | INTRAMUSCULAR | Status: AC
Start: 2020-11-03 — End: 2020-11-03
  Administered 2020-11-03: 25 mg via INTRAVENOUS

## 2020-11-03 NOTE — Patient Instructions (Signed)

## 2020-11-03 NOTE — Assessment & Plan Note (Signed)
07/01/2020:Screening mammogram showed a right breast mass. Mammogram and US showed a 0.7cm mass at the 6 o'clock position in the right breast, no axillary adenopathy. 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 T1BN0 stage Ia  08/11/2020:Right lumpectomy [name]): IDC, grade 3, 0.8cm, clear margins, 3 right axillary lymph nodes negative for carcinoma.ER 30% week, PR 0%, HER-2 positive, Ki-67 40%  Treatment plan: 1.adjuvant Taxol Herceptin/Herceptin discontinued after 5 cycles due to worsening cardiomyopathy 2.Adjuvant radiation therapy 3.Follow-up adjuvant antiestrogen therapy Patient is participating in the neuropathy clinical trial SWOG S1714 --------------------------------------------------------------------------------------------------------------------------------------- Current treatment: Cycle 9 day 1 of Taxol  She has a grade 1 peripheral sensory neuropathy.  This is intermittent and brief. She is otherwise tolerating her weekly Taxol well and will continue this.

## 2020-11-03 NOTE — Patient Instructions (Signed)
[facility] Discharge Instructions for Patients Receiving Chemotherapy  Today you received the following chemotherapy agents: paclitaxel.  To help prevent nausea and vomiting after your treatment, we encourage you to take your nausea medication as directed.   If you develop nausea and vomiting that is not controlled by your nausea medication, call the clinic.   BELOW ARE SYMPTOMS THAT SHOULD BE REPORTED IMMEDIATELY:  *FEVER GREATER THAN 100.5 F  *CHILLS WITH OR WITHOUT FEVER  NAUSEA AND VOMITING THAT IS NOT CONTROLLED WITH YOUR NAUSEA MEDICATION  *UNUSUAL SHORTNESS OF BREATH  *UNUSUAL BRUISING OR BLEEDING  TENDERNESS IN MOUTH AND THROAT WITH OR WITHOUT PRESENCE OF ULCERS  *URINARY PROBLEMS  *BOWEL PROBLEMS  UNUSUAL RASH Items with * indicate a potential emergency and should be followed up as soon as possible.  Feel free to call the clinic should you have any questions or concerns. The clinic phone number is [phone].  Please show the CHEMO ALERT CARD at check-in to the Emergency Department and triage nurse.   

## 2020-11-03 NOTE — Progress Notes (Signed)
W9604, A PROSPECTIVE OBSERVATIONAL COHORT STUDY TO DEVELOP A PREDICTIVE MODEL OF TAXANE-INDUCED PERIPHERAL NEUROPATHY IN CANCER PATIENTS.  8 Weeks Assessment  Patient in clinic today for continuation of her treatment. Patient was met with before her appointments and was provided the study questionnaires to complete. I reviewed with patient any neuropathy symptoms she may be having. Patient states she continues with some minor cold sensation to the the tips of her pinky and ring finger to her left hand, as well as, minor sensation of numbness to her right foot. Patient confirms this hasn't worsened since her last study assessment. Patient to be seen by Dr. Lindi Adie prior to her infusion appointment.    PROs: Questionnaires were given to patient to complete in clinic prior to registration. Collected questionnaires and checked for completeness and accuracy.  Labs: Patient did not consent to the optional whole blood collection.  Physician Assessments: CTCAE and Treatment Burden forms reviewed and completed Dr. Lindi Adie. Patient continues with dysesthesia grade 1 and peripheral sensory neuropathy grade 1, with a possible attribution to study intervention.  History of Falls: Not required at this time point. Timed Get Up and Go: Not required at this time point. Assessment for Interventions for CIPN: Reviewed with patient and CRFs completed. Neuropen Assessment: Completed per protocol by this certified research RN, time recording completed by Clabe Seal, Clinical Research Coordinator.  [place] Assessment: Completed per protocol by this Film/video editor, time recording completed by Clabe Seal, Scientist, physiological.  Plan: Patient informed of next study assessments to be in approximately 4 weeks and we will try to complete on same day as treatment.  Plan for this visit to be approximately 12/01/2020 (+/- 14 days) for collection of 12 weeks assessment.  Patient denied having any questions at this  time. Patient thanked for her time and continued support of study and she was encouraged to call clinic or myself with any questions or concerns she may have prior to her next appointment. Maxwell Marion, RN, BSN, [facility] Clinical Research 11/03/2020 3:02 PM

## 2020-11-04 ENCOUNTER — Other Ambulatory Visit: Payer: Self-pay

## 2020-11-04 NOTE — Patient Outreach (Signed)
°  [facility]) Care Management Chronic Special Needs Program    11/04/2020  Name: Toni [name], DOB: [birth year]  MRN: [id number]   [name] is enrolled in a chronic special needs plan for Diabetes. [facility] [facility] will continue to provide services for this member through 11/18/2020. The [facility] Care Management Team will assume care 11/19/2020.   Thea Silversmith, RN, MSN, [place] [phone]

## 2020-11-08 ENCOUNTER — Telehealth: Payer: Self-pay | Admitting: Hematology and Oncology

## 2020-11-08 NOTE — Telephone Encounter (Signed)
No 12/16 los, no changes made to pt schedule

## 2020-11-10 ENCOUNTER — Inpatient Hospital Stay: Payer: HMO

## 2020-11-10 ENCOUNTER — Other Ambulatory Visit: Payer: Self-pay

## 2020-11-10 VITALS — BP 131/69 | HR 97 | Temp 98.3°F | Resp 17

## 2020-11-10 DIAGNOSIS — C50511 Malignant neoplasm of lower-outer quadrant of right female breast: Secondary | ICD-10-CM

## 2020-11-10 DIAGNOSIS — Z5111 Encounter for antineoplastic chemotherapy: Secondary | ICD-10-CM | POA: Diagnosis not present

## 2020-11-10 DIAGNOSIS — Z170 Estrogen receptor positive status [ER+]: Secondary | ICD-10-CM

## 2020-11-10 LAB — CMP (CANCER CENTER ONLY)
ALT: 21 U/L (ref 0–44)
AST: 20 U/L (ref 15–41)
Albumin: 3.7 g/dL (ref 3.5–5.0)
Alkaline Phosphatase: 49 U/L (ref 38–126)
Anion gap: 8 (ref 5–15)
BUN: 9 mg/dL (ref 8–23)
CO2: 31 mmol/L (ref 22–32)
Calcium: 10 mg/dL (ref 8.9–10.3)
Chloride: 103 mmol/L (ref 98–111)
Creatinine: 0.75 mg/dL (ref 0.44–1.00)
GFR, Estimated: >60 mL/min (ref >60)
Glucose, Bld: 129 mg/dL — ABNORMAL HIGH (ref 70–99)
Potassium: 3.9 mmol/L (ref 3.5–5.1)
Sodium: 142 mmol/L (ref 135–145)
Total Bilirubin: 0.3 mg/dL (ref 0.3–1.2)
Total Protein: 7.2 g/dL (ref 6.5–8.1)

## 2020-11-10 LAB — CBC WITH DIFFERENTIAL (CANCER CENTER ONLY)
Abs Immature Granulocytes: 0.04 10*3/uL (ref 0.00–0.07)
Basophils Absolute: 0 10*3/uL (ref 0.0–0.1)
Basophils Relative: 1 %
Eosinophils Absolute: 0.1 10*3/uL (ref 0.0–0.5)
Eosinophils Relative: 1 %
HCT: 37.3 % (ref 36.0–46.0)
Hemoglobin: 12.1 g/dL (ref 12.0–15.0)
Immature Granulocytes: 1 %
Lymphocytes Relative: 37 %
Lymphs Abs: 2.1 10*3/uL (ref 0.7–4.0)
MCH: 29.9 pg (ref 26.0–34.0)
MCHC: 32.4 g/dL (ref 30.0–36.0)
MCV: 92.1 fL (ref 80.0–100.0)
Monocytes Absolute: 0.4 10*3/uL (ref 0.1–1.0)
Monocytes Relative: 6 %
Neutro Abs: 3.1 10*3/uL (ref 1.7–7.7)
Neutrophils Relative %: 54 %
Platelet Count: 328 10*3/uL (ref 150–400)
RBC: 4.05 MIL/uL (ref 3.87–5.11)
RDW: 14.8 % (ref 11.5–15.5)
WBC Count: 5.7 10*3/uL (ref 4.0–10.5)
nRBC: 0 % (ref 0.0–0.2)

## 2020-11-10 MED ORDER — DEXAMETHASONE SODIUM PHOSPHATE 10 MG/ML IJ SOLN
INTRAMUSCULAR | Status: AC
  Filled 2020-11-10: qty 1

## 2020-11-10 MED ORDER — FAMOTIDINE IN NACL 20-0.9 MG/50ML-% IV SOLN
INTRAVENOUS | Status: AC
  Filled 2020-11-10: qty 50

## 2020-11-10 MED ORDER — SODIUM CHLORIDE 0.9% FLUSH
10.0000 mL | INTRAVENOUS | Status: DC | PRN
  Administered 2020-11-10: 10 mL
  Filled 2020-11-10: qty 10

## 2020-11-10 MED ORDER — DIPHENHYDRAMINE HCL 50 MG/ML IJ SOLN
25.0000 mg | Freq: Once | INTRAMUSCULAR | Status: AC
  Administered 2020-11-10: 25 mg via INTRAVENOUS

## 2020-11-10 MED ORDER — DEXAMETHASONE SODIUM PHOSPHATE 10 MG/ML IJ SOLN
4.0000 mg | Freq: Once | INTRAMUSCULAR | Status: AC
  Administered 2020-11-10: 4 mg via INTRAVENOUS

## 2020-11-10 MED ORDER — FAMOTIDINE IN NACL 20-0.9 MG/50ML-% IV SOLN
20.0000 mg | Freq: Once | INTRAVENOUS | Status: AC
  Administered 2020-11-10: 20 mg via INTRAVENOUS

## 2020-11-10 MED ORDER — HEPARIN SOD (PORK) LOCK FLUSH 100 UNIT/ML IV SOLN
500.0000 [IU] | Freq: Once | INTRAVENOUS | Status: AC | PRN
  Administered 2020-11-10: 500 [IU]
  Filled 2020-11-10: qty 5

## 2020-11-10 MED ORDER — SODIUM CHLORIDE 0.9 % IV SOLN
Freq: Once | INTRAVENOUS | Status: AC
  Filled 2020-11-10: qty 250

## 2020-11-10 MED ORDER — DIPHENHYDRAMINE HCL 50 MG/ML IJ SOLN
INTRAMUSCULAR | Status: AC
  Filled 2020-11-10: qty 1

## 2020-11-10 MED ORDER — PACLITAXEL CHEMO INJECTION 300 MG/50ML
80.0000 mg/m2 | Freq: Once | INTRAVENOUS | Status: AC
  Administered 2020-11-10: 144 mg via INTRAVENOUS
  Filled 2020-11-10: qty 24

## 2020-11-10 NOTE — Progress Notes (Signed)
Okay to start with premeds pending patient lab results per Dr. Lindi Adie

## 2020-11-17 ENCOUNTER — Inpatient Hospital Stay: Payer: HMO

## 2020-11-17 ENCOUNTER — Inpatient Hospital Stay (HOSPITAL_BASED_OUTPATIENT_CLINIC_OR_DEPARTMENT_OTHER): Payer: HMO | Admitting: Adult Health

## 2020-11-17 ENCOUNTER — Other Ambulatory Visit: Payer: Self-pay

## 2020-11-17 ENCOUNTER — Encounter: Payer: Self-pay | Admitting: *Deleted

## 2020-11-17 ENCOUNTER — Encounter: Payer: Self-pay | Admitting: Adult Health

## 2020-11-17 VITALS — BP 134/54 | HR 89 | Temp 97.6°F | Resp 18 | Ht 65.0 in | Wt 152.4 lb

## 2020-11-17 DIAGNOSIS — Z170 Estrogen receptor positive status [ER+]: Secondary | ICD-10-CM

## 2020-11-17 DIAGNOSIS — C50511 Malignant neoplasm of lower-outer quadrant of right female breast: Secondary | ICD-10-CM

## 2020-11-17 DIAGNOSIS — I10 Essential (primary) hypertension: Secondary | ICD-10-CM | POA: Diagnosis not present

## 2020-11-17 DIAGNOSIS — E785 Hyperlipidemia, unspecified: Secondary | ICD-10-CM | POA: Diagnosis not present

## 2020-11-17 DIAGNOSIS — Z95828 Presence of other vascular implants and grafts: Secondary | ICD-10-CM

## 2020-11-17 DIAGNOSIS — Z5111 Encounter for antineoplastic chemotherapy: Secondary | ICD-10-CM | POA: Diagnosis not present

## 2020-11-17 DIAGNOSIS — I509 Heart failure, unspecified: Secondary | ICD-10-CM | POA: Diagnosis not present

## 2020-11-17 DIAGNOSIS — E1169 Type 2 diabetes mellitus with other specified complication: Secondary | ICD-10-CM | POA: Diagnosis not present

## 2020-11-17 LAB — CBC WITH DIFFERENTIAL (CANCER CENTER ONLY)
Abs Immature Granulocytes: 0.04 10*3/uL (ref 0.00–0.07)
Basophils Absolute: 0 10*3/uL (ref 0.0–0.1)
Basophils Relative: 1 %
Eosinophils Absolute: 0.1 10*3/uL (ref 0.0–0.5)
Eosinophils Relative: 2 %
HCT: 41.8 % (ref 36.0–46.0)
Hemoglobin: 13.3 g/dL (ref 12.0–15.0)
Immature Granulocytes: 1 %
Lymphocytes Relative: 36 %
Lymphs Abs: 1.7 10*3/uL (ref 0.7–4.0)
MCH: 29.8 pg (ref 26.0–34.0)
MCHC: 31.8 g/dL (ref 30.0–36.0)
MCV: 93.5 fL (ref 80.0–100.0)
Monocytes Absolute: 0.3 10*3/uL (ref 0.1–1.0)
Monocytes Relative: 6 %
Neutro Abs: 2.7 10*3/uL (ref 1.7–7.7)
Neutrophils Relative %: 54 %
Platelet Count: 372 10*3/uL (ref 150–400)
RBC: 4.47 MIL/uL (ref 3.87–5.11)
RDW: 15.4 % (ref 11.5–15.5)
WBC Count: 4.9 10*3/uL (ref 4.0–10.5)
nRBC: 0 % (ref 0.0–0.2)

## 2020-11-17 LAB — CMP (CANCER CENTER ONLY)
ALT: 19 U/L (ref 0–44)
AST: 15 U/L (ref 15–41)
Albumin: 3.8 g/dL (ref 3.5–5.0)
Alkaline Phosphatase: 50 U/L (ref 38–126)
Anion gap: 5 (ref 5–15)
BUN: 18 mg/dL (ref 8–23)
CO2: 30 mmol/L (ref 22–32)
Calcium: 10.3 mg/dL (ref 8.9–10.3)
Chloride: 105 mmol/L (ref 98–111)
Creatinine: 1.04 mg/dL — ABNORMAL HIGH (ref 0.44–1.00)
GFR, Estimated: 60 mL/min — ABNORMAL LOW (ref >60)
Glucose, Bld: 183 mg/dL — ABNORMAL HIGH (ref 70–99)
Potassium: 4.8 mmol/L (ref 3.5–5.1)
Sodium: 140 mmol/L (ref 135–145)
Total Bilirubin: 0.3 mg/dL (ref 0.3–1.2)
Total Protein: 7.7 g/dL (ref 6.5–8.1)

## 2020-11-17 MED ORDER — PACLITAXEL CHEMO INJECTION 300 MG/50ML
80.0000 mg/m2 | Freq: Once | INTRAVENOUS | Status: AC
  Administered 2020-11-17: 144 mg via INTRAVENOUS
  Filled 2020-11-17: qty 24

## 2020-11-17 MED ORDER — DIPHENHYDRAMINE HCL 50 MG/ML IJ SOLN
INTRAMUSCULAR | Status: AC
  Filled 2020-11-17: qty 1

## 2020-11-17 MED ORDER — SODIUM CHLORIDE 0.9% FLUSH
10.0000 mL | Freq: Once | INTRAVENOUS | Status: AC
  Administered 2020-11-17: 10 mL
  Filled 2020-11-17: qty 10

## 2020-11-17 MED ORDER — HEPARIN SOD (PORK) LOCK FLUSH 100 UNIT/ML IV SOLN
500.0000 [IU] | Freq: Once | INTRAVENOUS | Status: AC | PRN
  Administered 2020-11-17: 500 [IU]
  Filled 2020-11-17: qty 5

## 2020-11-17 MED ORDER — FAMOTIDINE IN NACL 20-0.9 MG/50ML-% IV SOLN
20.0000 mg | Freq: Once | INTRAVENOUS | Status: AC
  Administered 2020-11-17: 20 mg via INTRAVENOUS

## 2020-11-17 MED ORDER — DEXAMETHASONE SODIUM PHOSPHATE 10 MG/ML IJ SOLN
INTRAMUSCULAR | Status: AC
  Filled 2020-11-17: qty 1

## 2020-11-17 MED ORDER — SODIUM CHLORIDE 0.9% FLUSH
10.0000 mL | INTRAVENOUS | Status: DC | PRN
  Administered 2020-11-17: 10 mL
  Filled 2020-11-17: qty 10

## 2020-11-17 MED ORDER — DEXAMETHASONE SODIUM PHOSPHATE 10 MG/ML IJ SOLN
4.0000 mg | Freq: Once | INTRAMUSCULAR | Status: AC
  Administered 2020-11-17: 4 mg via INTRAVENOUS

## 2020-11-17 MED ORDER — DIPHENHYDRAMINE HCL 50 MG/ML IJ SOLN
25.0000 mg | Freq: Once | INTRAMUSCULAR | Status: AC
  Administered 2020-11-17: 25 mg via INTRAVENOUS

## 2020-11-17 MED ORDER — FAMOTIDINE IN NACL 20-0.9 MG/50ML-% IV SOLN
INTRAVENOUS | Status: AC
  Filled 2020-11-17: qty 50

## 2020-11-17 MED ORDER — SODIUM CHLORIDE 0.9 % IV SOLN
Freq: Once | INTRAVENOUS | Status: AC
  Filled 2020-11-17: qty 250

## 2020-11-17 NOTE — Progress Notes (Signed)
[facility] Follow up:    Donald Prose, [place] [address]   DIAGNOSIS: Cancer Staging Malignant neoplasm of lower-outer quadrant of right breast of female, estrogen receptor positive ([place]) Staging form: Breast, AJCC 8th Edition - Clinical stage from 07/06/2020: Stage IB (cT1b, cN0, cM0, G3, ER+, PR-, HER2-) - Signed by Nicholas Lose, MD on 07/06/2020 - Pathologic stage from 08/18/2020: Stage IA (pT1b, pN0, cM0, G3, ER+, PR-, HER2+) - Signed by Gardenia Phlegm, NP on 10/12/2020   SUMMARY OF ONCOLOGIC HISTORY: Oncology History  Malignant neoplasm of lower-outer quadrant of right breast of female, estrogen receptor positive ([place])  07/01/2020 Initial Diagnosis   Screening mammogram showed a right breast mass. Mammogram and US showed a 0.7cm mass at the 6 o'clock position in the right breast, no axillary adenopathy. 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    07/06/2020 Cancer Staging   Staging form: Breast, AJCC 8th Edition - Clinical stage from 07/06/2020: Stage IB (cT1b, cN0, cM0, G3, ER+, PR-, HER2-) - Signed by Nicholas Lose, MD on 07/06/2020   07/26/2020 Genetic Testing   Negative genetic testing:  No pathogenic variants detected on the [facility] Common Hereditary Cancers Panel. The report date is 07/26/2020.   The Common Hereditary Cancers Panel offered by [facility] includes sequencing and/or deletion duplication testing of the following 48 genes: APC, ATM, AXIN2, BARD1, BMPR1A, BRCA1, BRCA2, BRIP1, CDH1, CDK4, CDKN2A (p14ARF), CDKN2A (p16INK4a), CHEK2, CTNNA1, DICER1, EPCAM (Deletion/duplication testing only), GREM1 (promoter region deletion/duplication testing only), KIT, MEN1, MLH1, MSH2, MSH3, MSH6, MUTYH, NBN, NF1, NTHL1, PALB2, PDGFRA, PMS2, POLD1, POLE, PTEN, RAD50, RAD51C, RAD51D, RNF43, SDHB, SDHC, SDHD, SMAD4, SMARCA4. STK11, TP53, TSC1, TSC2,  and VHL.  The following genes were evaluated for sequence changes only: SDHA and HOXB13 c.251G>A variant only.   08/11/2020 Surgery   Right lumpectomy [name]): IDC, grade 3, 0.8cm, clear margins, 3 right axillary lymph nodes negative for carcinoma. ER 30% weak, PR 0%, HER-2 positive, Ki-67 40%   08/18/2020 Cancer Staging   Staging form: Breast, AJCC 8th Edition - Pathologic stage from 08/18/2020: Stage IA (pT1b, pN0, cM0, G3, ER+, PR-, HER2+) - Signed by Gardenia Phlegm, NP on 10/12/2020   09/08/2020 -  Adjuvant Chemotherapy   Weekly Taxol/herceptin x 12, followed by herceptin maintenance x 1 year; Herceptin stopped after first 5 weekly cycles due to decline in EF from 40% to 30%.       CURRENT THERAPY: Week 11 Taxol  INTERVAL HISTORY: Aubri Gathright 64 y.o. female returns for evaluation prior to receiving week # 11/12 of her weekly taxol.  She is tolerating treatment well and denies any difficulty.  She is having some mild increased fatigue, and some mild intermittent tingling in the pads of her fingertips. This is not going on currently, and she is otherwise well.      Patient Active Problem List   Diagnosis Date Noted  . Encounter for antineoplastic chemotherapy 09/15/2020  . Port-A-Cath in place 09/08/2020  . Genetic testing 07/28/2020  . Family history of breast cancer   . Family history of prostate cancer   . Family history of thyroid cancer   . Malignant neoplasm of lower-outer quadrant of right breast of female, estrogen receptor positive ([place]) 07/01/2020  . 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 Nonischemic cardiomyopathy ([place]) 06/04/2019  . Hypomagnesemia 11/18/2018  . Hypokalemia 11/15/2018  .  Diabetes mellitus without complication ([place])   . Coronary artery disease   . Acute on chronic systolic CHF (congestive heart failure) ([place]) 01/04/2016  . Hyperlipidemia 01/03/2016  . Cardiomegaly - hypertensive 01/03/2016  . DM (diabetes  mellitus), type 2 with complications ([place]) 33/00/7622  . Hypertension 01/03/2014    is allergic to biaxin [clarithromycin], enalapril maleate, and vasotec [enalapril].  MEDICAL HISTORY: Past Medical History:  Diagnosis Date  . AICD (automatic cardioverter/defibrillator) present   . Cancer [facility])    Right sided breast cancer  . CHF (congestive heart failure) ([place])   . Diabetes mellitus without complication ([place])   . Family history of breast cancer   . Family history of prostate cancer   . Family history of thyroid cancer   . Hypertension     SURGICAL HISTORY: Past Surgical History:  Procedure Laterality Date  . ABDOMINAL HYSTERECTOMY    . BREAST LUMPECTOMY WITH RADIOACTIVE SEED AND SENTINEL LYMPH NODE BIOPSY Right 08/11/2020   Procedure: RIGHT BREAST LUMPECTOMY WITH RADIOACTIVE SEED AND SENTINEL LYMPH NODE BIOPSY;  Surgeon: Coralie Keens, MD;  Location: [facility];  Service: General;  Laterality: Right;  . CARDIAC CATHETERIZATION     in [facility], clean, per pt.  . ICD IMPLANT N/A 06/18/2019   Procedure: ICD IMPLANT;  Surgeon: Evans Lance, MD;  Location: [place] CV LAB;  Service: Cardiovascular;  Laterality: N/A;  . PORTACATH PLACEMENT N/A 08/31/2020   Procedure: INSERTION PORT-A-CATH WITH ULTRASOUND GUIDANCE;  Surgeon: Coralie Keens, MD;  Location: [place];  Service: General;  Laterality: N/A;  . RIGHT/LEFT HEART CATH AND CORONARY ANGIOGRAPHY N/A 11/17/2018   Procedure: RIGHT/LEFT HEART CATH AND CORONARY ANGIOGRAPHY;  Surgeon: Belva Crome, MD;  Location: [facility] CV LAB;  Service: Cardiovascular;  Laterality: N/A;    SOCIAL HISTORY: Social History   Socioeconomic History  . Marital status: Single    Spouse name: Not on file  . Number of children: Not on file  . Years of education: 47  . Highest education level: Bachelor's degree (e.g., BA, AB, BS)  Occupational History  . Occupation: Surveyor, quantity: [employer]  Tobacco Use  . Smoking  status: Never Smoker  . Smokeless tobacco: Never Used  Vaping Use  . Vaping Use: Never used  Substance and Sexual Activity  . Alcohol use: Yes    Alcohol/week: 0.0 standard drinks    Comment: less than once a month  . Drug use: No  . Sexual activity: Never  Other Topics Concern  . Not on file  Social History Narrative   Works at [employer].  Lives alone.     Social Determinants of Health   Financial Resource Strain: Low Risk   . Difficulty of Paying Living Expenses: Not hard at all  Food Insecurity: No Food Insecurity  . Worried About Charity fundraiser in the Last Year: Never true  . Ran Out of Food in the Last Year: Never true  Transportation Needs: No Transportation Needs  . Lack of Transportation (Medical): No  . Lack of Transportation (Non-Medical): No  Physical Activity: Not on file  Stress: Not on file  Social Connections: Not on file  Intimate Partner Violence: Not on file    FAMILY HISTORY: Family History  Problem Relation Age of Onset  . Diabetes Mellitus I Mother   . Lung cancer Mother 61  . Breast cancer Mother        dx. late 30s/early 89s  . Diabetes Mellitus I Father   .  Sudden death Father 67  . Prostate cancer Brother 20  . Hypertension Brother   . Hypertension Brother   . Diabetes Mellitus I Brother   . Benign prostatic hyperplasia Brother   . Heart failure Paternal Uncle   . Thyroid cancer Niece        dx. in her 38s  . Breast cancer Cousin        dx. in her 83s, recurrence in her 30s (maternal first cousin)  . Breast cancer Cousin        female dx. in his early 37s (paternal first cousin)  . Cancer Cousin        dx. in his early 68s, unknown type (paternal first cousin)  . Cancer Cousin 4       unknown type (paternal first cousin)    Review of Systems  Constitutional: Negative for appetite change, chills, fatigue, fever and unexpected weight change.  HENT:   Negative for hearing loss, lump/mass and trouble swallowing.   Eyes: Negative for eye  problems and icterus.  Respiratory: Negative for chest tightness, cough and shortness of breath.   Cardiovascular: Negative for chest pain, leg swelling and palpitations.  Gastrointestinal: Negative for abdominal distention, abdominal pain, constipation, diarrhea, nausea and vomiting.  Endocrine: Negative for hot flashes.  Genitourinary: Negative for difficulty urinating.   Musculoskeletal: Negative for arthralgias.  Skin: Negative for itching and rash.  Neurological: Negative for dizziness, extremity weakness, headaches and numbness.  Hematological: Negative for adenopathy. Does not bruise/bleed easily.  Psychiatric/Behavioral: Negative for depression. The patient is not nervous/anxious.       PHYSICAL EXAMINATION  ECOG PERFORMANCE STATUS: 1 - Symptomatic but completely ambulatory  Vitals:   11/17/20 1345  BP: (!) 134/54  Pulse: 89  Resp: 18  Temp: 97.6 F (36.4 C)  SpO2: 99%    Physical Exam Constitutional:      General: She is not in acute distress.    Appearance: Normal appearance. She is not toxic-appearing.  HENT:     Head: Normocephalic and atraumatic.  Eyes:     General: No scleral icterus. Cardiovascular:     Rate and Rhythm: Normal rate and regular rhythm.     Pulses: Normal pulses.     Heart sounds: Normal heart sounds.  Pulmonary:     Effort: Pulmonary effort is normal.     Breath sounds: Normal breath sounds.  Abdominal:     General: Abdomen is flat. Bowel sounds are normal. There is no distension.     Palpations: Abdomen is soft.     Tenderness: There is no abdominal tenderness.  Musculoskeletal:        General: No swelling.     Cervical back: Neck supple.  Lymphadenopathy:     Cervical: No cervical adenopathy.  Skin:    General: Skin is warm and dry.     Capillary Refill: Capillary refill takes less than 2 seconds.     Findings: No rash.  Neurological:     General: No focal deficit present.     Mental Status: She is alert.  Psychiatric:         Mood and Affect: Mood normal.        Behavior: Behavior normal.     LABORATORY DATA:  CBC    Component Value Date/Time   WBC 4.5 11/24/2020 1126   WBC 5.3 08/31/2020 1200   RBC 4.36 11/24/2020 1126   HGB 13.0 11/24/2020 1126   HGB 14.8 06/04/2019 0955   HCT 39.5 11/24/2020 1126  HCT 44.8 06/04/2019 0955   PLT 320 11/24/2020 1126   PLT 287 06/04/2019 0955   MCV 90.6 11/24/2020 1126   MCV 90 06/04/2019 0955   MCH 29.8 11/24/2020 1126   MCHC 32.9 11/24/2020 1126   RDW 15.1 11/24/2020 1126   RDW 11.7 06/04/2019 0955   LYMPHSABS 1.8 11/24/2020 1126   LYMPHSABS 2.4 06/04/2019 0955   MONOABS 0.3 11/24/2020 1126   EOSABS 0.1 11/24/2020 1126   EOSABS 0.2 06/04/2019 0955   BASOSABS 0.0 11/24/2020 1126   BASOSABS 0.0 06/04/2019 0955    CMP     Component Value Date/Time   NA 140 11/24/2020 1126   K 4.4 11/24/2020 1126   CL 106 11/24/2020 1126   CO2 23 11/24/2020 1126   GLUCOSE 154 (H) 11/24/2020 1126   BUN 16 11/24/2020 1126   CREATININE 0.89 11/24/2020 1126   CALCIUM 10.0 11/24/2020 1126   PROT 7.6 11/24/2020 1126   ALBUMIN 3.9 11/24/2020 1126   AST 16 11/24/2020 1126   ALT 18 11/24/2020 1126   ALKPHOS 53 11/24/2020 1126   BILITOT 0.6 11/24/2020 1126   GFRNONAA >60 11/24/2020 1126   GFRAA >60 08/22/2020 1635   GFRAA >60 07/06/2020 0821    ASSESSMENT and PLAN:   Malignant neoplasm of lower-outer quadrant of right breast of female, estrogen receptor positive ([place]) 07/01/2020:Screening mammogram showed a right breast mass. Mammogram and US showed a 0.7cm mass at the 6 o'clock position in the right breast, no axillary adenopathy. 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 T1BN0 stage Ia  08/11/2020:Right lumpectomy [name]): IDC, grade 3, 0.8cm, clear margins, 3 right axillary lymph nodes negative for carcinoma.ER 30% week, PR 0%, HER-2 positive, Ki-67 40%  Treatment  plan: 1.adjuvant Taxol Herceptin/Herceptin discontinued after 5 cycles due to worsening cardiomyopathy 2.Adjuvant radiation therapy 3.Follow-up adjuvant antiestrogen therapy Patient is participating in the neuropathy clinical trial SWOG S1714 --------------------------------------------------------------------------------------------------------------------------------------- Current treatment: Cycle 11 day 1 of Taxol  Her labs are stable and she will proceed with treatment today.  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  She will return in 1 week for labs and trastuzumab and then will be ready to proceed with adjuvant radiation therapy.  I reviewed this with her in detail and she is in agreement.      Orders Placed This Encounter  Procedures  . Ambulatory referral to Radiation Oncology    Referral Priority:   Routine    Referral Type:   Consultation    Referral Reason:   Specialty Services Required    Referred to Provider:   Kyung Rudd, MD    Requested Specialty:   Radiation Oncology    Number of Visits Requested:   1    All questions were answered. The patient knows to call the clinic with any problems, questions or concerns. We can certainly see the patient much sooner if necessary.  Total encounter time: 30 minutes*  Wilber Bihari, NP 11/24/20 5:43 PM Medical Oncology and Hematology [facility] [place], [place] [address] Tel. [phone]    Fax. [phone]  *Total Encounter Time as defined by the Centers for Medicare and Medicaid Services includes, in addition to the face-to-face time of a patient visit (documented in the note above) non-face-to-face time: obtaining and reviewing outside history, ordering and reviewing medications, tests or procedures, care coordination (communications with other health care professionals or caregivers) and documentation in the medical record.

## 2020-11-17 NOTE — Patient Instructions (Signed)

## 2020-11-17 NOTE — Patient Instructions (Signed)
[facility] Discharge Instructions for Patients Receiving Chemotherapy  Today you received the following chemotherapy agents: paclitaxel.  To help prevent nausea and vomiting after your treatment, we encourage you to take your nausea medication as directed.   If you develop nausea and vomiting that is not controlled by your nausea medication, call the clinic.   BELOW ARE SYMPTOMS THAT SHOULD BE REPORTED IMMEDIATELY:  *FEVER GREATER THAN 100.5 F  *CHILLS WITH OR WITHOUT FEVER  NAUSEA AND VOMITING THAT IS NOT CONTROLLED WITH YOUR NAUSEA MEDICATION  *UNUSUAL SHORTNESS OF BREATH  *UNUSUAL BRUISING OR BLEEDING  TENDERNESS IN MOUTH AND THROAT WITH OR WITHOUT PRESENCE OF ULCERS  *URINARY PROBLEMS  *BOWEL PROBLEMS  UNUSUAL RASH Items with * indicate a potential emergency and should be followed up as soon as possible.  Feel free to call the clinic should you have any questions or concerns. The clinic phone number is [phone].  Please show the CHEMO ALERT CARD at check-in to the Emergency Department and triage nurse.   

## 2020-11-21 ENCOUNTER — Telehealth: Payer: Self-pay | Admitting: Hematology and Oncology

## 2020-11-21 NOTE — Telephone Encounter (Signed)
Scheduled per 12/30 los. Pt will receive an updated appt calendar per next visit appt notes   

## 2020-11-22 ENCOUNTER — Other Ambulatory Visit: Payer: Self-pay | Admitting: General Practice

## 2020-11-23 ENCOUNTER — Telehealth: Payer: Self-pay | Admitting: Medical Oncology

## 2020-11-23 NOTE — Telephone Encounter (Signed)
O0355, A PROSPECTIVE OBSERVATIONAL COHORT STUDY TO DEVELOP A PREDICTIVE MODEL OFTAXANE-INDUCED PERIPHERAL NEUROPATHY IN CANCER PATIENTS.  Outgoing call Spoke with patient regarding 12 week study assessments are now due. Inquired with patient if she was available to come to clinic 30 minutes earlier, prior to her scheduled appointments tomorrow, to complete these assessments. Patient stated she was available. Patient thanked and was informed that I will schedule an appointment with me, tomorrow at 10:15.  Patient was thanked for her time and availability.  Rexene Edison, RN, BSN, [facility] Clinical Research 11/23/2020 3:51 PM

## 2020-11-24 ENCOUNTER — Ambulatory Visit: Payer: HMO

## 2020-11-24 ENCOUNTER — Encounter: Payer: HMO | Admitting: Medical Oncology

## 2020-11-24 ENCOUNTER — Encounter: Payer: Self-pay | Admitting: Medical Oncology

## 2020-11-24 ENCOUNTER — Encounter: Payer: Self-pay | Admitting: *Deleted

## 2020-11-24 ENCOUNTER — Other Ambulatory Visit: Payer: Self-pay

## 2020-11-24 ENCOUNTER — Other Ambulatory Visit: Payer: HMO

## 2020-11-24 ENCOUNTER — Inpatient Hospital Stay: Payer: HMO

## 2020-11-24 ENCOUNTER — Inpatient Hospital Stay: Payer: HMO | Attending: Hematology and Oncology

## 2020-11-24 VITALS — BP 91/60 | HR 90 | Temp 97.9°F | Resp 18 | Wt 150.0 lb

## 2020-11-24 DIAGNOSIS — Z170 Estrogen receptor positive status [ER+]: Secondary | ICD-10-CM

## 2020-11-24 DIAGNOSIS — C50511 Malignant neoplasm of lower-outer quadrant of right female breast: Secondary | ICD-10-CM | POA: Diagnosis not present

## 2020-11-24 DIAGNOSIS — Z5111 Encounter for antineoplastic chemotherapy: Secondary | ICD-10-CM | POA: Diagnosis not present

## 2020-11-24 DIAGNOSIS — Z95828 Presence of other vascular implants and grafts: Secondary | ICD-10-CM

## 2020-11-24 LAB — CMP (CANCER CENTER ONLY)
ALT: 18 U/L (ref 0–44)
AST: 16 U/L (ref 15–41)
Albumin: 3.9 g/dL (ref 3.5–5.0)
Alkaline Phosphatase: 53 U/L (ref 38–126)
Anion gap: 11 (ref 5–15)
BUN: 16 mg/dL (ref 8–23)
CO2: 23 mmol/L (ref 22–32)
Calcium: 10 mg/dL (ref 8.9–10.3)
Chloride: 106 mmol/L (ref 98–111)
Creatinine: 0.89 mg/dL (ref 0.44–1.00)
GFR, Estimated: >60 mL/min (ref >60)
Glucose, Bld: 154 mg/dL — ABNORMAL HIGH (ref 70–99)
Potassium: 4.4 mmol/L (ref 3.5–5.1)
Sodium: 140 mmol/L (ref 135–145)
Total Bilirubin: 0.6 mg/dL (ref 0.3–1.2)
Total Protein: 7.6 g/dL (ref 6.5–8.1)

## 2020-11-24 LAB — CBC WITH DIFFERENTIAL (CANCER CENTER ONLY)
Abs Immature Granulocytes: 0.05 10*3/uL (ref 0.00–0.07)
Basophils Absolute: 0 10*3/uL (ref 0.0–0.1)
Basophils Relative: 1 %
Eosinophils Absolute: 0.1 10*3/uL (ref 0.0–0.5)
Eosinophils Relative: 2 %
HCT: 39.5 % (ref 36.0–46.0)
Hemoglobin: 13 g/dL (ref 12.0–15.0)
Immature Granulocytes: 1 %
Lymphocytes Relative: 40 %
Lymphs Abs: 1.8 10*3/uL (ref 0.7–4.0)
MCH: 29.8 pg (ref 26.0–34.0)
MCHC: 32.9 g/dL (ref 30.0–36.0)
MCV: 90.6 fL (ref 80.0–100.0)
Monocytes Absolute: 0.3 10*3/uL (ref 0.1–1.0)
Monocytes Relative: 7 %
Neutro Abs: 2.3 10*3/uL (ref 1.7–7.7)
Neutrophils Relative %: 49 %
Platelet Count: 320 10*3/uL (ref 150–400)
RBC: 4.36 MIL/uL (ref 3.87–5.11)
RDW: 15.1 % (ref 11.5–15.5)
WBC Count: 4.5 10*3/uL (ref 4.0–10.5)
nRBC: 0 % (ref 0.0–0.2)

## 2020-11-24 MED ORDER — FAMOTIDINE IN NACL 20-0.9 MG/50ML-% IV SOLN
20.0000 mg | Freq: Once | INTRAVENOUS | Status: AC
  Administered 2020-11-24: 20 mg via INTRAVENOUS

## 2020-11-24 MED ORDER — SODIUM CHLORIDE 0.9 % IV SOLN
Freq: Once | INTRAVENOUS | Status: AC
  Filled 2020-11-24: qty 250

## 2020-11-24 MED ORDER — DEXAMETHASONE SODIUM PHOSPHATE 10 MG/ML IJ SOLN
4.0000 mg | Freq: Once | INTRAMUSCULAR | Status: AC
Start: 2020-11-24 — End: 2020-11-24
  Administered 2020-11-24: 4 mg via INTRAVENOUS

## 2020-11-24 MED ORDER — HEPARIN SOD (PORK) LOCK FLUSH 100 UNIT/ML IV SOLN
500.0000 [IU] | Freq: Once | INTRAVENOUS | Status: AC | PRN
Start: 2020-11-24 — End: 2020-11-24
  Administered 2020-11-24: 500 [IU]
  Filled 2020-11-24: qty 5

## 2020-11-24 MED ORDER — SODIUM CHLORIDE 0.9% FLUSH
10.0000 mL | Freq: Once | INTRAVENOUS | Status: AC
  Administered 2020-11-24: 10 mL
  Filled 2020-11-24: qty 10

## 2020-11-24 MED ORDER — DEXAMETHASONE SODIUM PHOSPHATE 10 MG/ML IJ SOLN
INTRAMUSCULAR | Status: AC
  Filled 2020-11-24: qty 1

## 2020-11-24 MED ORDER — PACLITAXEL CHEMO INJECTION 300 MG/50ML
80.0000 mg/m2 | Freq: Once | INTRAVENOUS | Status: AC
  Administered 2020-11-24: 144 mg via INTRAVENOUS
  Filled 2020-11-24: qty 24

## 2020-11-24 MED ORDER — DIPHENHYDRAMINE HCL 50 MG/ML IJ SOLN
INTRAMUSCULAR | Status: AC
  Filled 2020-11-24: qty 1

## 2020-11-24 MED ORDER — SODIUM CHLORIDE 0.9% FLUSH
10.0000 mL | INTRAVENOUS | Status: DC | PRN
  Administered 2020-11-24: 10 mL
  Filled 2020-11-24: qty 10

## 2020-11-24 MED ORDER — FAMOTIDINE IN NACL 20-0.9 MG/50ML-% IV SOLN
INTRAVENOUS | Status: AC
  Filled 2020-11-24: qty 50

## 2020-11-24 MED ORDER — DIPHENHYDRAMINE HCL 50 MG/ML IJ SOLN
25.0000 mg | Freq: Once | INTRAMUSCULAR | Status: AC
  Administered 2020-11-24: 25 mg via INTRAVENOUS

## 2020-11-24 NOTE — Patient Instructions (Signed)
[facility] Discharge Instructions for Patients Receiving Chemotherapy  Today you received the following chemotherapy agents: paclitaxel.  To help prevent nausea and vomiting after your treatment, we encourage you to take your nausea medication as directed.   If you develop nausea and vomiting that is not controlled by your nausea medication, call the clinic.   BELOW ARE SYMPTOMS THAT SHOULD BE REPORTED IMMEDIATELY:  *FEVER GREATER THAN 100.5 F  *CHILLS WITH OR WITHOUT FEVER  NAUSEA AND VOMITING THAT IS NOT CONTROLLED WITH YOUR NAUSEA MEDICATION  *UNUSUAL SHORTNESS OF BREATH  *UNUSUAL BRUISING OR BLEEDING  TENDERNESS IN MOUTH AND THROAT WITH OR WITHOUT PRESENCE OF ULCERS  *URINARY PROBLEMS  *BOWEL PROBLEMS  UNUSUAL RASH Items with * indicate a potential emergency and should be followed up as soon as possible.  Feel free to call the clinic should you have any questions or concerns. The clinic phone number is [phone].  Please show the CHEMO ALERT CARD at check-in to the Emergency Department and triage nurse.   

## 2020-11-24 NOTE — Progress Notes (Unsigned)
U0370, A PROSPECTIVE OBSERVATIONAL COHORT STUDY TO DEVELOP A PREDICTIVE MODEL OF TAXANE-INDUCED PERIPHERAL NEUROPATHY IN CANCER PATIENTS.  12 Weeks Assessment  Patient in clinic today for continuation of her treatment. Patient was met with before her appointments and was provided the study questionnaires to complete. I reviewed with patient any neuropathy symptoms she may be having. Patient states she has some tingling and cold sensation, that comes and goes, to the tips of her left hand index and middle finger as well as the same to her left big toe. Patient confirms that it does not interfere in her daily life.    PROs: Patient was provided with questionnaires to complete and these were collected and checked for completeness.  Labs: Patient did not consent to the optional whole blood collection.  Physician Assessments: CTCAE and Treatment Burden forms reviewed and completed by Dr. Lindi Adie. Patient continues with fatigue, which she states had worsened since after her last chemo treatment, and feels only in the last few days, she was not as fatigued.  History of Falls: Not required at this time point. Timed Get Up and Go: Not required at this time point. Assessment for Interventions for CIPN: Reviewed with patient and CRFs completed. Neuropen Assessment: Completed per protocol by this certified research RN, time recording was done by research nurse, Wilber Bihari.  [place] Assessment: Completed per protocol by this Film/video editor, time recording was done by research nurse, Wilber Bihari.  Plan: Patient informed of next study assessments to be in approximately 12 weeks, for the 24 week study assessment, and that we will try to complete on same day as when she is in clinic, if not we will schedule her with a research appointment. Patient denied having any questions at this time. Patient thanked for her time and continued support of study and was encouraged to call clinic or myself with  any questions or concerns she may have. Maxwell Marion, RN, BSN, [facility] Clinical Research 11/24/2020 11:48 AM

## 2020-11-24 NOTE — Assessment & Plan Note (Signed)
07/01/2020:Screening mammogram showed a right breast mass. Mammogram and US showed a 0.7cm mass at the 6 o'clock position in the right breast, no axillary adenopathy. 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 T1BN0 stage Ia  08/11/2020:Right lumpectomy [name]): IDC, grade 3, 0.8cm, clear margins, 3 right axillary lymph nodes negative for carcinoma.ER 30% week, PR 0%, HER-2 positive, Ki-67 40%  Treatment plan: 1.adjuvant Taxol Herceptin/Herceptin discontinued after 5 cycles due to worsening cardiomyopathy 2.Adjuvant radiation therapy 3.Follow-up adjuvant antiestrogen therapy Patient is participating in the neuropathy clinical trial SWOG S1714 --------------------------------------------------------------------------------------------------------------------------------------- Current treatment: Cycle 11 day 1 of Taxol  Her labs are stable and she will proceed with treatment today.  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  She will return in 1 week for labs and trastuzumab and then will be ready to proceed with adjuvant radiation therapy.  I reviewed this with her in detail and she is in agreement.

## 2020-12-05 ENCOUNTER — Telehealth: Payer: Self-pay | Admitting: Radiation Oncology

## 2020-12-05 ENCOUNTER — Encounter: Payer: Self-pay | Admitting: Radiation Oncology

## 2020-12-05 ENCOUNTER — Telehealth: Payer: Self-pay | Admitting: *Deleted

## 2020-12-05 NOTE — Telephone Encounter (Signed)
Pt called requesting appt with Dr. Lisbeth Renshaw be a virtual appt d/t the weather. She is unable to get to her car. Msg sent to Dr. Lisbeth Renshaw, Bryson Ha and scheduling to change appt. No further needs voiced at this time.

## 2020-12-05 NOTE — Telephone Encounter (Signed)
I have [name] changed to a Toni [name] consult tomorrow for Rad Onc, 1/18 @ 1pm. The CT SIM appt is still 1/19 @ 10am. I have given her the instructions on how to do a MyChart video visit.

## 2020-12-06 ENCOUNTER — Ambulatory Visit: Payer: HMO | Admitting: Radiation Oncology

## 2020-12-06 ENCOUNTER — Encounter: Payer: Self-pay | Admitting: Radiation Oncology

## 2020-12-06 ENCOUNTER — Other Ambulatory Visit: Payer: Self-pay

## 2020-12-06 ENCOUNTER — Ambulatory Visit: Payer: HMO

## 2020-12-06 ENCOUNTER — Ambulatory Visit
Admission: RE | Admit: 2020-12-06 | Discharge: 2020-12-06 | Disposition: A | Discharge status: Home / Self Care | Payer: HMO | Source: Ambulatory Visit | Attending: Radiation Oncology | Admitting: Radiation Oncology

## 2020-12-06 VITALS — Ht 65.0 in | Wt 151.8 lb

## 2020-12-06 DIAGNOSIS — C50511 Malignant neoplasm of lower-outer quadrant of right female breast: Secondary | ICD-10-CM

## 2020-12-06 DIAGNOSIS — Z170 Estrogen receptor positive status [ER+]: Secondary | ICD-10-CM | POA: Diagnosis not present

## 2020-12-06 NOTE — Progress Notes (Deleted)
Radiation Oncology         ([phone]) [phone] ________________________________  Initial Outpatient Consultation - Conducted via telephone due to current COVID-19 concerns for limiting patient exposure  I spoke with the patient to conduct this consult visit via telephone to spare the patient unnecessary potential exposure in the healthcare setting during the current COVID-19 pandemic. The patient was notified in advance and was offered a [place] meeting to allow for face to face communication but unfortunately reported that they did not have the appropriate resources/technology to support such a visit and instead preferred to proceed with a telephone consult.    Name: [place]oni [name]        MRN: [id number]  Date of Service: 12/06/2020 DOB: 1956-03-21  CC:Sun, [place], MD  Toni Lose, MD     REFERRING PHYSICIAN: Nicholas Lose, MD   DIAGNOSIS: The encounter diagnosis was Malignant neoplasm of lower-outer quadrant of right breast of female, estrogen receptor positive ([place]).   HISTORY OF PRESENT ILLNESS: Toni [name] is a 65 y.o. female originally seen in the multidisciplinary breast clinic for a new diagnosis of left breast cancer. The patient was noted to have a screening deteceted mass in the right breast. There was a mass seen at 6:00 measuring 7 x 4 x4 mm. Her axilla was negative for adenopathy. 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  Since her last visit, she has undergone surgical resection on 08/11/2020 which revealed a grade 3 invasive ductal carcinoma measuring 8 mm, her resection margins were negative for carcinoma but the closest was the posterior margin at 1 mm 3 sentinel lymph nodes were sampled and were negative for disease.  Based on the tumor size being greater than 5 mm, she proceeded with adjuvant chemotherapy and HER2 targeted treatment she began this regimen on 09/08/2020 she was only able to tolerate 5 infusions of  HER2 targeted therapy given the development of cardiomyopathy, her last dose of Taxol was administered on 11/24/2020.  She is contacted today to discuss adjuvant radiotherapy.    PREVIOUS RADIATION THERAPY: No   PAST MEDICAL HISTORY:  Past Medical History:  Diagnosis Date  . AICD (automatic cardioverter/defibrillator) present   . Cancer [facility])    Right sided breast cancer  . CHF (congestive heart failure) ([place])   . Diabetes mellitus without complication ([place])   . Family history of breast cancer   . Family history of prostate cancer   . Family history of thyroid cancer   . Hypertension        PAST SURGICAL HISTORY: Past Surgical History:  Procedure Laterality Date  . ABDOMINAL HYSTERECTOMY    . BREAST LUMPECTOMY WITH RADIOACTIVE SEED AND SENTINEL LYMPH NODE BIOPSY Right 08/11/2020   Procedure: RIGHT BREAST LUMPECTOMY WITH RADIOACTIVE SEED AND SENTINEL LYMPH NODE BIOPSY;  Surgeon: Coralie Keens, MD;  Location: [place];  Service: General;  Laterality: Right;  . CARDIAC CATHETERIZATION     in [facility], clean, per pt.  . ICD IMPLANT N/A 06/18/2019   Procedure: ICD IMPLANT;  Surgeon: Evans Lance, MD;  Location: [place] CV LAB;  Service: Cardiovascular;  Laterality: N/A;  . PORTACATH PLACEMENT N/A 08/31/2020   Procedure: INSERTION PORT-A-CATH WITH ULTRASOUND GUIDANCE;  Surgeon: Coralie Keens, MD;  Location: [place];  Service: General;  Laterality: N/A;  . RIGHT/LEFT HEART CATH AND CORONARY ANGIOGRAPHY N/A 11/17/2018   Procedure: RIGHT/LEFT HEART CATH AND CORONARY ANGIOGRAPHY;  Surgeon: Belva Crome, MD;  Location: [place] CV LAB;  Service: Cardiovascular;  Laterality: N/A;     FAMILY HISTORY:  Family History  Problem Relation Age of Onset  . Diabetes Mellitus I Mother   . Lung cancer Mother 50  . Breast cancer Mother        dx. late 30s/early 72s  . Diabetes Mellitus I Father   . Sudden death Father 81  . Prostate cancer Brother 75  . Hypertension Brother    . Hypertension Brother   . Diabetes Mellitus I Brother   . Benign prostatic hyperplasia Brother   . Heart failure Paternal Uncle   . Thyroid cancer Niece        dx. in her 41s  . Breast cancer Cousin        dx. in her 89s, recurrence in her 76s (maternal first cousin)  . Breast cancer Cousin        female dx. in his early 44s (paternal first cousin)  . Cancer Cousin        dx. in his early 47s, unknown type (paternal first cousin)  . Cancer Cousin 42       unknown type (paternal first cousin)     SOCIAL HISTORY:  reports that she has never smoked. She has never used smokeless tobacco. She reports current alcohol use. She reports that she does not use drugs. The patient is single. She lives in [place]. She is retired from working at [employer] in [place]. She enjoys going to yard sales.   ALLERGIES: Biaxin [clarithromycin], Enalapril maleate, and Vasotec [enalapril]   MEDICATIONS:  Current Outpatient Medications  Medication Sig Dispense Refill  . aspirin 81 MG chewable tablet Chew 1 tablet (81 mg total) by mouth daily. 30 tablet 0  . carvedilol (COREG) 25 MG tablet TAKE 1 TABLET (25 MG TOTAL) BY MOUTH 2 (TWO) TIMES DAILY WITH A MEAL. 180 tablet 3  . cetirizine (ZYRTEC) 10 MG tablet Take 10 mg by mouth daily.     . Continuous Blood Gluc Receiver (FREESTYLE LIBRE 14 DAY READER) DEVI Apply topically as directed.    . Continuous Blood Gluc Sensor (FREESTYLE LIBRE 14 DAY SENSOR) MISC Apply topically as directed.    . digoxin (LANOXIN) 0.125 MG tablet Take 0.5 tablets (0.0625 mg total) by mouth daily. 45 tablet 1  . furosemide (LASIX) 20 MG tablet Take 20 mg by mouth daily as needed for fluid or edema.     Marland Kitchen glipiZIDE (GLUCOTROL XL) 10 MG 24 hr tablet Take 20 mg by mouth daily with breakfast.     . hydrALAZINE (APRESOLINE) 50 MG tablet TAKE 1 TABLET (50 MG TOTAL) BY MOUTH 3 (THREE) TIMES DAILY. 270 tablet 1  . insulin glargine (LANTUS) 100 UNIT/ML injection Inject 12 Units into  the skin daily.    . isosorbide dinitrate (ISORDIL) 20 MG tablet TAKE 1 TABLET (20 MG TOTAL) BY MOUTH 3 TIMES DAILY. 90 tablet 11  . JARDIANCE 25 MG TABS tablet Take 25 mg by mouth daily.    Marland Kitchen lidocaine-prilocaine (EMLA) cream Apply to affected area once 30 g 3  . LINZESS 145 MCG CAPS capsule Take 145 mcg by mouth daily as needed (constipation). Constipation    . losartan (COZAAR) 50 MG tablet Take 1 tablet (50 mg total) by mouth in the morning and at bedtime. 60 tablet 6  . metFORMIN (GLUCOPHAGE) 1000 MG tablet Take 1,000 mg by mouth 2 (two) times daily with a meal.    . Multiple Vitamin (MULTIVITAMIN WITH MINERALS)  TABS tablet Take 1 tablet by mouth daily. Centrum Silver    . ondansetron (ZOFRAN) 8 MG tablet Take 1 tablet (8 mg total) by mouth 2 (two) times daily as needed (Nausea or vomiting). 30 tablet 1  . OZEMPIC, 1 MG/DOSE, 4 MG/3ML SOPN Inject 1 mg into the skin every Saturday.     . prochlorperazine (COMPAZINE) 10 MG tablet Take 1 tablet (10 mg total) by mouth every 6 (six) hours as needed (Nausea or vomiting). 30 tablet 1  . simvastatin (ZOCOR) 20 MG tablet Take 20 mg by mouth at bedtime.     Marland Kitchen spironolactone (ALDACTONE) 25 MG tablet TAKE 1 TABLET BY MOUTH EVERY EVENING. 90 tablet 3  . traMADol (ULTRAM) 50 MG tablet Take 1-2 tablets (50-100 mg total) by mouth every 6 (six) hours as needed for moderate pain. 20 tablet 0  . TRUE METRIX BLOOD GLUCOSE TEST test strip 1 each by Other route as directed.   5  . TRUEPLUS LANCETS 30G MISC 1 each by Other route as directed. Use as directed.  5  . UNIFINE PENTIPS 32G X 4 MM MISC 1 each by Other route as directed.      No current facility-administered medications for this encounter.     REVIEW OF SYSTEMS: On review of systems, the patient reports that she is doing well overall.  She states that she did tolerate chemotherapy very well but did have fatigue from it.  She denies any significant neuropathy or difficulty with nausea or  vomiting.  PHYSICAL EXAM: Unable to assess given encounter type    ECOG = 0  0 - Asymptomatic (Fully active, able to carry on all predisease activities without restriction)  1 - Symptomatic but completely ambulatory (Restricted in physically strenuous activity but ambulatory and able to carry out work of a light or sedentary nature. For example, light housework, office work)  2 - Symptomatic, <50% in bed during the day (Ambulatory and capable of all self care but unable to carry out any work activities. Up and about more than 50% of waking hours)  3 - Symptomatic, >50% in bed, but not bedbound (Capable of only limited self-care, confined to bed or chair 50% or more of waking hours)  4 - Bedbound (Completely disabled. Cannot carry on any self-care. Totally confined to bed or chair)  5 - Death   Eustace Pen MM, Creech RH, Tormey DC, et al. [phone]). "Toxicity and response criteria of the [facility]". [place] Oncol. 5 (6): 649-55    LABORATORY DATA:  Lab Results  Component Value Date   WBC 4.5 11/24/2020   HGB 13.0 11/24/2020   HCT 39.5 11/24/2020   MCV 90.6 11/24/2020   PLT 320 11/24/2020   Lab Results  Component Value Date   NA 140 11/24/2020   K 4.4 11/24/2020   CL 106 11/24/2020   CO2 23 11/24/2020   Lab Results  Component Value Date   ALT 18 11/24/2020   AST 16 11/24/2020   ALKPHOS 53 11/24/2020   BILITOT 0.6 11/24/2020      RADIOGRAPHY: No results found.     IMPRESSION/PLAN: 1. 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 Dr. Lisbeth Renshaw discusses the final results of surgery and reviewed the rationale for adjuvant radiotherapy to the breast to reduce risks of local recurrence, she would also benefit from antiestrogen therapy.  She is doing well since completing chemotherapy and would be ready to begin  treatment in the next few weeks.  We discussed the risks, benefits, short, and long  term effects of radiotherapy, and the patient is interested in proceeding. Dr. Lisbeth Renshaw discusses the delivery and logistics of radiotherapy and recommends 4  weeks of radiotherapy with curative intent to the right breast.  She will simulate tomorrow and anticipate starting treatment at the earliest the week of 12/19/2020.     Given current concerns for patient exposure during the COVID-19 pandemic, this encounter was conducted via telephone.  The patient has provided two factor identification and has given verbal consent for this type of encounter and has been advised to only accept a meeting of this type in a secure network environment. The time spent during this encounter was 45  minutes including preparation, discussion, and coordination of the patient's care. The attendants for this meeting include  Dr. Lisbeth Renshaw, Hayden Pedro  and Butler Denmark.  During the encounter,  Dr. Lisbeth Renshaw, and Hayden Pedro were located at [facility] Radiation Oncology Department.  Daven Pinckney was located at home.   The above documentation reflects my direct findings during this shared patient visit. Please see the separate note by Dr. Lisbeth Renshaw on this date for the remainder of the patient's plan of care.    Carola Rhine, PAC

## 2020-12-06 NOTE — Progress Notes (Signed)
Location of Breast Cancer: Right Breast  Did patient present with symptoms (if so, please note symptoms) or was this found on screening mammography?: Routine Mammogram  ECHO 12/20/2020:   Histology per Pathology Report: Right Breast Lumpectomy 08/11/2020  Receptor Status: ER(30%), PR (0%), Her2-neu (+), Ki-67(40%)    Past/Anticipated interventions by surgeon, if any:  Past/Anticipated interventions by medical oncology, if any: Chemotherapy  Dr. Lindi Adie NP Mendel Ryder 11/24/2020 Treatment plan: 1.adjuvant Taxol Herceptin/Herceptin discontinued after 5 cycles due to worsening cardiomyopathy 2.Adjuvant radiation therapy 3.Follow-up adjuvant antiestrogen therapy Patient is participating in the neuropathy clinical trial SWOG S1714  -Tentatively completed chemo, awaiting echo to determine if more chemo will be done.   -Last infusion was 11/24/2020  Lymphedema issues, if any:  No  Pain issues, if any:  No  SAFETY ISSUES:  Prior radiation? No  Pacemaker/ICD? ICD 06/18/2019  Possible current pregnancy? Hysterectomy  Is the patient on methotrexate? No  Current Complaints / other details:   Port Insertion 08/31/2020     Toni Razor, RN 12/06/2020,1:18 PM

## 2020-12-07 ENCOUNTER — Other Ambulatory Visit: Payer: Self-pay

## 2020-12-07 ENCOUNTER — Ambulatory Visit
Admission: RE | Admit: 2020-12-07 | Discharge: 2020-12-07 | Disposition: A | Discharge status: Home / Self Care | Payer: HMO | Source: Ambulatory Visit | Attending: Radiation Oncology | Admitting: Radiation Oncology

## 2020-12-07 DIAGNOSIS — Z170 Estrogen receptor positive status [ER+]: Secondary | ICD-10-CM | POA: Diagnosis not present

## 2020-12-07 DIAGNOSIS — Z510 Encounter for antineoplastic radiation therapy: Secondary | ICD-10-CM | POA: Diagnosis not present

## 2020-12-07 DIAGNOSIS — C50511 Malignant neoplasm of lower-outer quadrant of right female breast: Secondary | ICD-10-CM | POA: Insufficient documentation

## 2020-12-07 NOTE — Progress Notes (Signed)
Attestation signed by Toni Rudd, MD at 07/06/2020 12:18 PM   Please see the note from Toni Simpson, PA-C from today's visit for more details of today's encounter.  I have personally performed a face to face diagnostic evaluation on this patient and devised the following assessment and plan.    The patient was seen today in clinic preoperatively for her diagnosis of breast cancer. The patient appears to be a good candidate for breast conservation treatment.  Based on the patient's current presentation, it is anticipated that she will receive adjuvant chemotherapy following surgery.  We discussed the recommendation to subsequently proceed with adjuvant radiation treatment at the appropriate time. We discussed the potential benefit of treatment as well as possible side effects and risks. All of the patient's questions were answered. I look forward to seeing the patient at the appropriate time postoperatively to further coordinate her care. I would anticipate treating the patient with tangent whole breast radiation treatment fields for 4 - 6 1/2 weeks, potentially 4 weeks base on her current information.      Toni Rudd, MD       Radiation Oncology         [phone] ________________________________  Name: [place]oni [name]        MRN: [id number]  Date of Service: 07/06/2020 DOB: Apr 21, 1956  CC:Sun, [place]oni Crown, MD  Toni Keens, MD     REFERRING PHYSICIAN: Coralie Keens, MD   DIAGNOSIS:Malignant neoplasm of lower-outer quadrant of right breast of female, estrogen receptor positive ([place])   HISTORY OF PRESENT ILLNESS: Toni [name] is a 65 y.o. female seen in the multidisciplinary breast clinic for a new diagnosis of right breast cancer. The patient was noted to have a screening deteceted mass in the right breast. There was a mass seen at 6:00 measuring 7 x 4 x4 mm. Her axilla was negative for adenopathy. A biopsy on 06/28/20 revealed a grade 3 invasive ductal carcinoma, ER  positive, HER amplified by FISH. She is seen today to discuss treatment recommendations for her cancer.    PREVIOUS RADIATION THERAPY: No   PAST MEDICAL HISTORY:  Past Medical History:  Diagnosis Date  . AICD (automatic cardioverter/defibrillator) present   . Cancer [facility])    Right sided breast cancer  . CHF (congestive heart failure) ([place])   . Diabetes mellitus without complication ([place])   . Family history of breast cancer   . Family history of prostate cancer   . Family history of thyroid cancer   . Hypertension        PAST SURGICAL HISTORY: Past Surgical History:  Procedure Laterality Date  . ABDOMINAL HYSTERECTOMY    . BREAST LUMPECTOMY WITH RADIOACTIVE SEED AND SENTINEL LYMPH NODE BIOPSY Right 08/11/2020   Procedure: RIGHT BREAST LUMPECTOMY WITH RADIOACTIVE SEED AND SENTINEL LYMPH NODE BIOPSY;  Surgeon: Toni Keens, MD;  Location: [place];  Service: General;  Laterality: Right;  . CARDIAC CATHETERIZATION     in [facility], clean, per pt.  . ICD IMPLANT N/A 06/18/2019   Procedure: ICD IMPLANT;  Surgeon: Evans Lance, MD;  Location: [place] CV LAB;  Service: Cardiovascular;  Laterality: N/A;  . PORTACATH PLACEMENT N/A 08/31/2020   Procedure: INSERTION PORT-A-CATH WITH ULTRASOUND GUIDANCE;  Surgeon: Toni Keens, MD;  Location: [place];  Service: General;  Laterality: N/A;  . RIGHT/LEFT HEART CATH AND CORONARY ANGIOGRAPHY N/A 11/17/2018   Procedure: RIGHT/LEFT HEART CATH AND CORONARY ANGIOGRAPHY;  Surgeon: Belva Crome, MD;  Location: [place] CV LAB;  Service: Cardiovascular;  Laterality: N/A;     FAMILY HISTORY:  Family History  Problem Relation Age of Onset  . Diabetes Mellitus I Mother   . Lung cancer Mother 72  . Breast cancer Mother        dx. late 30s/early 43s  . Diabetes Mellitus I Father   . Sudden death Father 32  . Prostate cancer Brother 50  . Hypertension Brother   . Hypertension Brother   . Diabetes Mellitus I Brother   . Benign  prostatic hyperplasia Brother   . Heart failure Paternal Uncle   . Thyroid cancer Niece        dx. in her 80s  . Breast cancer Cousin        dx. in her 26s, recurrence in her 25s (maternal first cousin)  . Breast cancer Cousin        female dx. in his early 60s (paternal first cousin)  . Cancer Cousin        dx. in his early 59s, unknown type (paternal first cousin)  . Cancer Cousin 63       unknown type (paternal first cousin)     SOCIAL HISTORY:  reports that she has never smoked. She has never used smokeless tobacco. She reports current alcohol use. She reports that she does not use drugs. The patient is single. She lives in [name]. She is retired from working at [employer] in [place]. She is accompanied by her friend Toni [name]. She enjoys going to yard sales.   ALLERGIES: Biaxin [clarithromycin], Enalapril maleate, and Vasotec [enalapril]   MEDICATIONS:  Current Outpatient Medications  Medication Sig Dispense Refill  . aspirin 81 MG chewable tablet Chew 1 tablet (81 mg total) by mouth daily. 30 tablet 0  . carvedilol (COREG) 25 MG tablet TAKE 1 TABLET (25 MG TOTAL) BY MOUTH 2 (TWO) TIMES DAILY WITH A MEAL. 180 tablet 3  . cetirizine (ZYRTEC) 10 MG tablet Take 10 mg by mouth daily.     . Continuous Blood Gluc Receiver (FREESTYLE LIBRE 14 DAY READER) DEVI Apply topically as directed.    . Continuous Blood Gluc Sensor (FREESTYLE LIBRE 14 DAY SENSOR) MISC Apply topically as directed.    . digoxin (LANOXIN) 0.125 MG tablet Take 0.5 tablets (0.0625 mg total) by mouth daily. 45 tablet 1  . furosemide (LASIX) 20 MG tablet Take 20 mg by mouth daily as needed for fluid or edema.     Marland Kitchen glipiZIDE (GLUCOTROL XL) 10 MG 24 hr tablet Take 20 mg by mouth daily with breakfast.     . hydrALAZINE (APRESOLINE) 50 MG tablet TAKE 1 TABLET (50 MG TOTAL) BY MOUTH 3 (THREE) TIMES DAILY. 270 tablet 1  . insulin glargine (LANTUS) 100 UNIT/ML injection Inject 12 Units into the skin daily.    .  isosorbide dinitrate (ISORDIL) 20 MG tablet TAKE 1 TABLET (20 MG TOTAL) BY MOUTH 3 TIMES DAILY. 90 tablet 11  . JARDIANCE 25 MG TABS tablet Take 25 mg by mouth daily.    Marland Kitchen lidocaine-prilocaine (EMLA) cream Apply to affected area once 30 g 3  . LINZESS 145 MCG CAPS capsule Take 145 mcg by mouth daily as needed (constipation). Constipation    . losartan (COZAAR) 50 MG tablet Take 1 tablet (50 mg total) by mouth in the morning and at bedtime. 60 tablet 6  . metFORMIN (GLUCOPHAGE) 1000 MG tablet Take 1,000 mg by mouth 2 (two) times daily with a meal.    . Multiple Vitamin (MULTIVITAMIN WITH  MINERALS) TABS tablet Take 1 tablet by mouth daily. Centrum Silver    . ondansetron (ZOFRAN) 8 MG tablet Take 1 tablet (8 mg total) by mouth 2 (two) times daily as needed (Nausea or vomiting). 30 tablet 1  . OZEMPIC, 1 MG/DOSE, 4 MG/3ML SOPN Inject 1 mg into the skin every Saturday.     . prochlorperazine (COMPAZINE) 10 MG tablet Take 1 tablet (10 mg total) by mouth every 6 (six) hours as needed (Nausea or vomiting). 30 tablet 1  . simvastatin (ZOCOR) 20 MG tablet Take 20 mg by mouth at bedtime.     Marland Kitchen spironolactone (ALDACTONE) 25 MG tablet TAKE 1 TABLET BY MOUTH EVERY EVENING. 90 tablet 3  . traMADol (ULTRAM) 50 MG tablet Take 1-2 tablets (50-100 mg total) by mouth every 6 (six) hours as needed for moderate pain. 20 tablet 0  . TRUE METRIX BLOOD GLUCOSE TEST test strip 1 each by Other route as directed.   5  . TRUEPLUS LANCETS 30G MISC 1 each by Other route as directed. Use as directed.  5  . UNIFINE PENTIPS 32G X 4 MM MISC 1 each by Other route as directed.      No current facility-administered medications for this encounter.     REVIEW OF SYSTEMS: On review of systems, the patient reports that she is doing well overall. She denies any specific complaints today with the breast.    PHYSICAL EXAM:  Wt Readings from Last 3 Encounters:  12/06/20 151 lb 12.8 oz (68.9 kg)  11/24/20 150 lb (68 kg)  11/17/20 152  lb 6.4 oz (69.1 kg)   Temp Readings from Last 3 Encounters:  11/24/20 97.9 F (36.6 C) (Oral)  11/17/20 97.6 F (36.4 C) (Tympanic)  11/10/20 98.3 F (36.8 C) (Oral)   BP Readings from Last 3 Encounters:  11/24/20 91/60  11/17/20 (!) 134/54  11/10/20 131/69   Pulse Readings from Last 3 Encounters:  11/24/20 90  11/17/20 89  11/10/20 97    In general this is a well appearing African American female in no acute distress. She's alert and oriented x4 and appropriate throughout the examination. Cardiopulmonary assessment is negative for acute distress and she exhibits normal effort. Bilateral breast exam is deferred.    ECOG = 0  0 - Asymptomatic (Fully active, able to carry on all predisease activities without restriction)  1 - Symptomatic but completely ambulatory (Restricted in physically strenuous activity but ambulatory and able to carry out work of a light or sedentary nature. For example, light housework, office work)  2 - Symptomatic, <50% in bed during the day (Ambulatory and capable of all self care but unable to carry out any work activities. Up and about more than 50% of waking hours)  3 - Symptomatic, >50% in bed, but not bedbound (Capable of only limited self-care, confined to bed or chair 50% or more of waking hours)  4 - Bedbound (Completely disabled. Cannot carry on any self-care. Totally confined to bed or chair)  5 - Death   Eustace Pen MM, Creech RH, Tormey DC, et al. [phone]). "Toxicity and response criteria of the [facility] Group". [place] Oncol. 5 (6): 649-55    LABORATORY DATA:  Lab Results  Component Value Date   WBC 4.5 11/24/2020   HGB 13.0 11/24/2020   HCT 39.5 11/24/2020   MCV 90.6 11/24/2020   PLT 320 11/24/2020   Lab Results  Component Value Date   NA 140 11/24/2020   K 4.4  11/24/2020   CL 106 11/24/2020   CO2 23 11/24/2020   Lab Results  Component Value Date   ALT 18 11/24/2020   AST 16 11/24/2020   ALKPHOS 53  11/24/2020   BILITOT 0.6 11/24/2020      RADIOGRAPHY: No results found.     IMPRESSION/PLAN: 1. 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 Dr. Lisbeth Renshaw discusses the pathology findings and reviews the nature of right breast disease. The consensus from the breast conference includes breast conservation with lumpectomy with sentinel node biopsy. Dr. Lindi Adie recommends adjuvant systemic chemotherapy and Herceptin if her tumor is greater than 5 mm. She is also offered external radiotherapy to the breast followed by antiestrogen therapy. We discussed the risks, benefits, short, and long term effects of radiotherapy, and the patient is interested in proceeding. Dr. Lisbeth Renshaw discusses the delivery and logistics of radiotherapy and anticipates a course of 4-6 1/2 weeks of radiotherapy. We will see her back about 2 weeks after surgery to discuss the simulation process and anticipate we starting radiotherapy about 4-6 weeks after surgery.  2. Possible genetic predisposition to malignancy. The patient is a candidate for genetic testing given her personal and family history. She was offered referral and will meet with genetic counseling today.   In a visit lasting 45 minutes, greater than 50% of the time was spent face to face reviewing her case, as well as in preparation of, discussing, and coordinating the patient's care.  The above documentation reflects my direct findings during this shared patient visit. Please see the separate note by Dr. Lisbeth Renshaw on this date for the remainder of the patient's plan of care.    Carola Rhine, PAC

## 2020-12-07 NOTE — Addendum Note (Signed)
Encounter addended by: [place], PA-C on: 12/07/2020 9:52 AM  Actions taken: Clinical Note Signed

## 2020-12-07 NOTE — Addendum Note (Signed)
Encounter addended by: [place], PA-C on: 12/07/2020 9:54 AM  Actions taken: Delete clinical note

## 2020-12-07 NOTE — Addendum Note (Signed)
Encounter addended by: Kyung Rudd, MD on: 12/07/2020 9:43 AM  Actions taken: Edit attestation on clinical note

## 2020-12-07 NOTE — Addendum Note (Signed)
Encounter addended by: Hayden Pedro, PA-C on: 12/07/2020 12:28 PM  Actions taken: Delete clinical note

## 2020-12-07 NOTE — Progress Notes (Signed)
Radiation Oncology         [phone] ________________________________  Initial Outpatient Consultation - Conducted via telephone due to current COVID-19 concerns for limiting patient exposure  I spoke with the patient to conduct this consult visit via telephone to spare the patient unnecessary potential exposure in the healthcare setting during the current COVID-19 pandemic. The patient was notified in advance and was offered a [facility] meeting to allow for face to face communication but unfortunately reported that they did not have the appropriate resources/technology to support such a visit and instead preferred to proceed with a telephone consult.    Name: Toni [name]        MRN: [id number]  Date of Service: 12/06/2020 DOB: [birth year]  CC:Sun, Gari Crown, MD  Nicholas Lose, MD     REFERRING PHYSICIAN: Nicholas Lose, MD   DIAGNOSIS: The encounter diagnosis was Malignant neoplasm of lower-outer quadrant of right breast of female, estrogen receptor positive ([place]).   HISTORY OF PRESENT ILLNESS: Toni [name] is a 65 y.o. female originally seen in the multidisciplinary breast clinic for a new diagnosis of right breast cancer. The patient was noted to have a screening deteceted mass in the right breast. There was a mass seen at 6:00 measuring 7 x 4 x4 mm. Her axilla was negative for adenopathy. 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  Since her last visit, she has undergone surgical resection on 08/11/2020 which revealed a grade 3 invasive ductal carcinoma measuring 8 mm, her resection margins were negative for carcinoma but the closest was the posterior margin at 1 mm 3 sentinel lymph nodes were sampled and were negative for disease.  Based on the tumor size being greater than 5 mm, she proceeded with adjuvant chemotherapy and HER2 targeted treatment she began this regimen on 09/08/2020 she was only able to tolerate 5 infusions of  HER2 targeted therapy given the development of cardiomyopathy, her last dose of Taxol was administered on 11/24/2020.  She is contacted today to discuss adjuvant radiotherapy.    PREVIOUS RADIATION THERAPY: No   PAST MEDICAL HISTORY:  Past Medical History:  Diagnosis Date  . AICD (automatic cardioverter/defibrillator) present   . Cancer [place])    Right sided breast cancer  . CHF (congestive heart failure) ([place])   . Diabetes mellitus without complication ([place])   . Family history of breast cancer   . Family history of prostate cancer   . Family history of thyroid cancer   . Hypertension        PAST SURGICAL HISTORY: Past Surgical History:  Procedure Laterality Date  . ABDOMINAL HYSTERECTOMY    . BREAST LUMPECTOMY WITH RADIOACTIVE SEED AND SENTINEL LYMPH NODE BIOPSY Right 08/11/2020   Procedure: RIGHT BREAST LUMPECTOMY WITH RADIOACTIVE SEED AND SENTINEL LYMPH NODE BIOPSY;  Surgeon: Coralie Keens, MD;  Location: [facility];  Service: General;  Laterality: Right;  . CARDIAC CATHETERIZATION     in [facility], clean, per pt.  . ICD IMPLANT N/A 06/18/2019   Procedure: ICD IMPLANT;  Surgeon: Evans Lance, MD;  Location: [facility];  Service: Cardiovascular;  Laterality: N/A;  . PORTACATH PLACEMENT N/A 08/31/2020   Procedure: INSERTION PORT-A-CATH WITH ULTRASOUND GUIDANCE;  Surgeon: Coralie Keens, MD;  Location: [place];  Service: General;  Laterality: N/A;  . RIGHT/LEFT HEART CATH AND CORONARY ANGIOGRAPHY N/A 11/17/2018   Procedure: RIGHT/LEFT HEART CATH AND CORONARY ANGIOGRAPHY;  Surgeon: Belva Crome, MD;  Location: [facility];  Service: Cardiovascular;  Laterality: N/A;     FAMILY HISTORY:  Family History  Problem Relation Age of Onset  . Diabetes Mellitus I Mother   . Lung cancer Mother 49  . Breast cancer Mother        dx. late 30s/early 24s  . Diabetes Mellitus I Father   . Sudden death Father 38  . Prostate cancer Brother 21  . Hypertension Brother    . Hypertension Brother   . Diabetes Mellitus I Brother   . Benign prostatic hyperplasia Brother   . Heart failure Paternal Uncle   . Thyroid cancer Niece        dx. in her 59s  . Breast cancer Cousin        dx. in her 51s, recurrence in her 20s (maternal first cousin)  . Breast cancer Cousin        female dx. in his early 60s (paternal first cousin)  . Cancer Cousin        dx. in his early 47s, unknown type (paternal first cousin)  . Cancer Cousin 47       unknown type (paternal first cousin)     SOCIAL HISTORY:  reports that she has never smoked. She has never used smokeless tobacco. She reports current alcohol use. She reports that she does not use drugs. The patient is single. She lives in [place]. She is retired from working at [employer] in [employer]. She enjoys going to yard sales.   ALLERGIES: Biaxin [clarithromycin], Enalapril maleate, and Vasotec [enalapril]   MEDICATIONS:  Current Outpatient Medications  Medication Sig Dispense Refill  . aspirin 81 MG chewable tablet Chew 1 tablet (81 mg total) by mouth daily. 30 tablet 0  . carvedilol (COREG) 25 MG tablet TAKE 1 TABLET (25 MG TOTAL) BY MOUTH 2 (TWO) TIMES DAILY WITH A MEAL. 180 tablet 3  . cetirizine (ZYRTEC) 10 MG tablet Take 10 mg by mouth daily.     . Continuous Blood Gluc Receiver (FREESTYLE LIBRE 14 DAY READER) DEVI Apply topically as directed.    . Continuous Blood Gluc Sensor (FREESTYLE LIBRE 14 DAY SENSOR) MISC Apply topically as directed.    . digoxin (LANOXIN) 0.125 MG tablet Take 0.5 tablets (0.0625 mg total) by mouth daily. 45 tablet 1  . furosemide (LASIX) 20 MG tablet Take 20 mg by mouth daily as needed for fluid or edema.     Marland Kitchen glipiZIDE (GLUCOTROL XL) 10 MG 24 hr tablet Take 20 mg by mouth daily with breakfast.     . hydrALAZINE (APRESOLINE) 50 MG tablet TAKE 1 TABLET (50 MG TOTAL) BY MOUTH 3 (THREE) TIMES DAILY. 270 tablet 1  . insulin glargine (LANTUS) 100 UNIT/ML injection Inject 12 Units into  the skin daily.    . isosorbide dinitrate (ISORDIL) 20 MG tablet TAKE 1 TABLET (20 MG TOTAL) BY MOUTH 3 TIMES DAILY. 90 tablet 11  . JARDIANCE 25 MG TABS tablet Take 25 mg by mouth daily.    Marland Kitchen lidocaine-prilocaine (EMLA) cream Apply to affected area once 30 g 3  . LINZESS 145 MCG CAPS capsule Take 145 mcg by mouth daily as needed (constipation). Constipation    . losartan (COZAAR) 50 MG tablet Take 1 tablet (50 mg total) by mouth in the morning and at bedtime. 60 tablet 6  . metFORMIN (GLUCOPHAGE) 1000 MG tablet Take 1,000 mg by mouth 2 (two) times daily with a meal.    . Multiple Vitamin (MULTIVITAMIN WITH MINERALS)  TABS tablet Take 1 tablet by mouth daily. Centrum Silver    . ondansetron (ZOFRAN) 8 MG tablet Take 1 tablet (8 mg total) by mouth 2 (two) times daily as needed (Nausea or vomiting). 30 tablet 1  . OZEMPIC, 1 MG/DOSE, 4 MG/3ML SOPN Inject 1 mg into the skin every Saturday.     . prochlorperazine (COMPAZINE) 10 MG tablet Take 1 tablet (10 mg total) by mouth every 6 (six) hours as needed (Nausea or vomiting). 30 tablet 1  . simvastatin (ZOCOR) 20 MG tablet Take 20 mg by mouth at bedtime.     Marland Kitchen spironolactone (ALDACTONE) 25 MG tablet TAKE 1 TABLET BY MOUTH EVERY EVENING. 90 tablet 3  . traMADol (ULTRAM) 50 MG tablet Take 1-2 tablets (50-100 mg total) by mouth every 6 (six) hours as needed for moderate pain. 20 tablet 0  . TRUE METRIX BLOOD GLUCOSE TEST test strip 1 each by Other route as directed.   5  . TRUEPLUS LANCETS 30G MISC 1 each by Other route as directed. Use as directed.  5  . UNIFINE PENTIPS 32G X 4 MM MISC 1 each by Other route as directed.      No current facility-administered medications for this encounter.     REVIEW OF SYSTEMS: On review of systems, the patient reports that she is doing well overall.  She states that she did tolerate chemotherapy very well but did have fatigue from it.  She denies any significant neuropathy or difficulty with nausea or  vomiting.  PHYSICAL EXAM: Unable to assess given encounter type    ECOG = 0  0 - Asymptomatic (Fully active, able to carry on all predisease activities without restriction)  1 - Symptomatic but completely ambulatory (Restricted in physically strenuous activity but ambulatory and able to carry out work of a light or sedentary nature. For example, light housework, office work)  2 - Symptomatic, <50% in bed during the day (Ambulatory and capable of all self care but unable to carry out any work activities. Up and about more than 50% of waking hours)  3 - Symptomatic, >50% in bed, but not bedbound (Capable of only limited self-care, confined to bed or chair 50% or more of waking hours)  4 - Bedbound (Completely disabled. Cannot carry on any self-care. Totally confined to bed or chair)  5 - Death   Eustace Pen MM, Creech RH, Tormey DC, et al. [phone]). "Toxicity and response criteria of the [facility]". [place] Oncol. 5 (6): 649-55    LABORATORY DATA:  Lab Results  Component Value Date   WBC 4.5 11/24/2020   HGB 13.0 11/24/2020   HCT 39.5 11/24/2020   MCV 90.6 11/24/2020   PLT 320 11/24/2020   Lab Results  Component Value Date   NA 140 11/24/2020   K 4.4 11/24/2020   CL 106 11/24/2020   CO2 23 11/24/2020   Lab Results  Component Value Date   ALT 18 11/24/2020   AST 16 11/24/2020   ALKPHOS 53 11/24/2020   BILITOT 0.6 11/24/2020      RADIOGRAPHY: No results found.     IMPRESSION/PLAN: 1. 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 Dr. Lisbeth Renshaw discusses the final results of surgery and reviewed the rationale for adjuvant radiotherapy to the breast to reduce risks of local recurrence, she would also benefit from antiestrogen therapy.  She is doing well since completing chemotherapy and would be ready to begin  treatment in the next few weeks.  We discussed the risks, benefits, short, and long  term effects of radiotherapy, and the patient is interested in proceeding. Dr. Lisbeth Renshaw discusses the delivery and logistics of radiotherapy and recommends 4  weeks of radiotherapy with curative intent to the right breast.  She will simulate tomorrow and anticipate starting treatment at the earliest the week of 12/19/2020.     Given current concerns for patient exposure during the COVID-19 pandemic, this encounter was conducted via telephone.  The patient has provided two factor identification and has given verbal consent for this type of encounter and has been advised to only accept a meeting of this type in a secure network environment. The time spent during this encounter was 45  minutes including preparation, discussion, and coordination of the patient's care. The attendants for this meeting include  Dr. Lisbeth Renshaw, Hayden Pedro  and Butler Denmark.  During the encounter,  Dr. Lisbeth Renshaw, and Hayden Pedro were located at [facility] Radiation Oncology Department.  Estel Tonelli was located at home.   The above documentation reflects my direct findings during this shared patient visit. Please see the separate note by Dr. Lisbeth Renshaw on this date for the remainder of the patient's plan of care.    Carola Rhine, PAC

## 2020-12-08 ENCOUNTER — Other Ambulatory Visit (HOSPITAL_COMMUNITY): Payer: Self-pay | Admitting: Cardiology

## 2020-12-13 ENCOUNTER — Encounter: Payer: Self-pay | Admitting: *Deleted

## 2020-12-13 DIAGNOSIS — Z510 Encounter for antineoplastic radiation therapy: Secondary | ICD-10-CM | POA: Diagnosis not present

## 2020-12-13 DIAGNOSIS — C50511 Malignant neoplasm of lower-outer quadrant of right female breast: Secondary | ICD-10-CM | POA: Diagnosis not present

## 2020-12-13 DIAGNOSIS — Z170 Estrogen receptor positive status [ER+]: Secondary | ICD-10-CM | POA: Diagnosis not present

## 2020-12-15 ENCOUNTER — Ambulatory Visit
Admission: RE | Admit: 2020-12-15 | Discharge: 2020-12-15 | Disposition: A | Discharge status: Home / Self Care | Payer: HMO | Source: Ambulatory Visit | Attending: Radiation Oncology | Admitting: Radiation Oncology

## 2020-12-15 ENCOUNTER — Other Ambulatory Visit: Payer: Self-pay

## 2020-12-15 DIAGNOSIS — Z510 Encounter for antineoplastic radiation therapy: Secondary | ICD-10-CM | POA: Diagnosis not present

## 2020-12-15 DIAGNOSIS — C50511 Malignant neoplasm of lower-outer quadrant of right female breast: Secondary | ICD-10-CM | POA: Diagnosis not present

## 2020-12-15 DIAGNOSIS — Z170 Estrogen receptor positive status [ER+]: Secondary | ICD-10-CM | POA: Diagnosis not present

## 2020-12-15 NOTE — Progress Notes (Signed)
Pt here for patient teaching.  Pt given Radiation and You booklet, skin care instructions, Alra deodorant and Radiaplex gel.  Reviewed areas of pertinence such as fatigue, hair loss, skin changes, breast tenderness and breast swelling . Pt able to give teach back of to pat skin and use unscented/gentle soap,apply Radiaplex bid, avoid applying anything to skin within 4 hours of treatment, avoid wearing an under wire bra and to use an electric razor if they must shave. Pt verbalizes understanding of information given and will contact nursing with any questions or concerns.     LaToya M. Silva RN, BSN      

## 2020-12-16 ENCOUNTER — Ambulatory Visit
Admission: RE | Admit: 2020-12-16 | Discharge: 2020-12-16 | Disposition: A | Discharge status: Home / Self Care | Payer: HMO | Source: Ambulatory Visit | Attending: Radiation Oncology | Admitting: Radiation Oncology

## 2020-12-16 ENCOUNTER — Other Ambulatory Visit: Payer: Self-pay

## 2020-12-16 DIAGNOSIS — Z510 Encounter for antineoplastic radiation therapy: Secondary | ICD-10-CM | POA: Diagnosis not present

## 2020-12-16 DIAGNOSIS — E785 Hyperlipidemia, unspecified: Secondary | ICD-10-CM | POA: Diagnosis not present

## 2020-12-16 DIAGNOSIS — Z170 Estrogen receptor positive status [ER+]: Secondary | ICD-10-CM | POA: Diagnosis not present

## 2020-12-16 DIAGNOSIS — I509 Heart failure, unspecified: Secondary | ICD-10-CM | POA: Diagnosis not present

## 2020-12-16 DIAGNOSIS — C50511 Malignant neoplasm of lower-outer quadrant of right female breast: Secondary | ICD-10-CM

## 2020-12-16 DIAGNOSIS — I10 Essential (primary) hypertension: Secondary | ICD-10-CM | POA: Diagnosis not present

## 2020-12-16 DIAGNOSIS — E1169 Type 2 diabetes mellitus with other specified complication: Secondary | ICD-10-CM | POA: Diagnosis not present

## 2020-12-16 MED ORDER — RADIAPLEXRX EX GEL: Freq: Once | CUTANEOUS | Status: AC

## 2020-12-19 ENCOUNTER — Other Ambulatory Visit: Payer: Self-pay

## 2020-12-19 ENCOUNTER — Ambulatory Visit
Admission: RE | Admit: 2020-12-19 | Discharge: 2020-12-19 | Disposition: A | Discharge status: Home / Self Care | Payer: HMO | Source: Ambulatory Visit | Attending: Radiation Oncology | Admitting: Radiation Oncology

## 2020-12-19 DIAGNOSIS — C50511 Malignant neoplasm of lower-outer quadrant of right female breast: Secondary | ICD-10-CM | POA: Diagnosis not present

## 2020-12-19 DIAGNOSIS — Z170 Estrogen receptor positive status [ER+]: Secondary | ICD-10-CM | POA: Diagnosis not present

## 2020-12-19 DIAGNOSIS — Z510 Encounter for antineoplastic radiation therapy: Secondary | ICD-10-CM | POA: Diagnosis not present

## 2020-12-20 ENCOUNTER — Other Ambulatory Visit: Payer: Self-pay

## 2020-12-20 ENCOUNTER — Encounter (HOSPITAL_COMMUNITY): Payer: Self-pay | Admitting: Cardiology

## 2020-12-20 ENCOUNTER — Ambulatory Visit
Admission: RE | Admit: 2020-12-20 | Discharge: 2020-12-20 | Disposition: A | Discharge status: Home / Self Care | Payer: HMO | Source: Ambulatory Visit | Attending: Radiation Oncology | Admitting: Radiation Oncology

## 2020-12-20 ENCOUNTER — Ambulatory Visit (HOSPITAL_COMMUNITY)
Admission: RE | Admit: 2020-12-20 | Discharge: 2020-12-20 | Disposition: A | Discharge status: Home / Self Care | Payer: HMO | Source: Ambulatory Visit | Attending: Cardiology | Admitting: Cardiology

## 2020-12-20 ENCOUNTER — Ambulatory Visit (HOSPITAL_BASED_OUTPATIENT_CLINIC_OR_DEPARTMENT_OTHER)
Admission: RE | Admit: 2020-12-20 | Discharge: 2020-12-20 | Disposition: A | Discharge status: Home / Self Care | Payer: HMO | Source: Ambulatory Visit | Attending: Cardiology | Admitting: Cardiology

## 2020-12-20 VITALS — BP 90/50 | HR 67 | Wt 153.6 lb

## 2020-12-20 DIAGNOSIS — Z853 Personal history of malignant neoplasm of breast: Secondary | ICD-10-CM | POA: Insufficient documentation

## 2020-12-20 DIAGNOSIS — C50511 Malignant neoplasm of lower-outer quadrant of right female breast: Secondary | ICD-10-CM | POA: Diagnosis not present

## 2020-12-20 DIAGNOSIS — Z79899 Other long term (current) drug therapy: Secondary | ICD-10-CM | POA: Insufficient documentation

## 2020-12-20 DIAGNOSIS — I110 Hypertensive heart disease with heart failure: Secondary | ICD-10-CM | POA: Diagnosis not present

## 2020-12-20 DIAGNOSIS — Z510 Encounter for antineoplastic radiation therapy: Secondary | ICD-10-CM | POA: Insufficient documentation

## 2020-12-20 DIAGNOSIS — I428 Other cardiomyopathies: Secondary | ICD-10-CM | POA: Diagnosis not present

## 2020-12-20 DIAGNOSIS — E119 Type 2 diabetes mellitus without complications: Secondary | ICD-10-CM | POA: Diagnosis not present

## 2020-12-20 DIAGNOSIS — I5022 Chronic systolic (congestive) heart failure: Secondary | ICD-10-CM

## 2020-12-20 DIAGNOSIS — Z7982 Long term (current) use of aspirin: Secondary | ICD-10-CM | POA: Insufficient documentation

## 2020-12-20 DIAGNOSIS — Z923 Personal history of irradiation: Secondary | ICD-10-CM | POA: Diagnosis not present

## 2020-12-20 DIAGNOSIS — Z95810 Presence of automatic (implantable) cardiac defibrillator: Secondary | ICD-10-CM | POA: Diagnosis not present

## 2020-12-20 DIAGNOSIS — E785 Hyperlipidemia, unspecified: Secondary | ICD-10-CM | POA: Insufficient documentation

## 2020-12-20 DIAGNOSIS — I081 Rheumatic disorders of both mitral and tricuspid valves: Secondary | ICD-10-CM | POA: Insufficient documentation

## 2020-12-20 DIAGNOSIS — Z794 Long term (current) use of insulin: Secondary | ICD-10-CM | POA: Insufficient documentation

## 2020-12-20 DIAGNOSIS — Z7901 Long term (current) use of anticoagulants: Secondary | ICD-10-CM | POA: Diagnosis not present

## 2020-12-20 DIAGNOSIS — Z170 Estrogen receptor positive status [ER+]: Secondary | ICD-10-CM | POA: Diagnosis not present

## 2020-12-20 LAB — ECHOCARDIOGRAM COMPLETE
Area-P 1/2: 2.69 cm2
Calc EF: 30.1 %
S' Lateral: 4.5 cm
Single Plane A2C EF: 30.6 %
Single Plane A4C EF: 29.8 %

## 2020-12-20 LAB — BASIC METABOLIC PANEL
Anion gap: 11 (ref 5–15)
BUN: 17 mg/dL (ref 8–23)
CO2: 26 mmol/L (ref 22–32)
Calcium: 9.9 mg/dL (ref 8.9–10.3)
Chloride: 103 mmol/L (ref 98–111)
Creatinine, Ser: 0.79 mg/dL (ref 0.44–1.00)
GFR, Estimated: >60 mL/min (ref >60)
Glucose, Bld: 137 mg/dL — ABNORMAL HIGH (ref 70–99)
Potassium: 4.8 mmol/L (ref 3.5–5.1)
Sodium: 140 mmol/L (ref 135–145)

## 2020-12-20 LAB — DIGOXIN LEVEL: Digoxin Level: 0.5 ng/mL — ABNORMAL LOW (ref 0.8–2.0)

## 2020-12-20 NOTE — Patient Instructions (Addendum)
Labs done today. We will contact you only if your labs are abnormal.  No medication changes were made. Please continue all current medications as prescribed.  Your physician recommends that you schedule a follow-up appointment in: 2 months  Your physician has recommended that you have a cardiopulmonary stress test (CPX). CPX testing is a non-invasive measurement of heart and lung function. It replaces a traditional treadmill stress test. This type of test provides a tremendous amount of information that relates not only to your present condition but also for future outcomes. This test combines measurements of you ventilation, respiratory gas exchange in the lungs, electrocardiogram (EKG), blood pressure and physical response before, during, and following an exercise protocol. Please refer to the instruction sheet given to you at today's visit.    If you have any questions or concerns before your next appointment please send [place] a message through [facility] or call our office at [phone].    TO LEAVE A MESSAGE FOR THE NURSE SELECT OPTION 2, PLEASE LEAVE A MESSAGE INCLUDING: . YOUR NAME . DATE OF BIRTH . CALL BACK NUMBER . REASON FOR CALL**this is important as we prioritize the call backs  YOU WILL RECEIVE A CALL BACK THE SAME DAY AS LONG AS YOU CALL BEFORE 4:00 PM   Do the following things EVERYDAY: 1) Weigh yourself in the morning before breakfast. Write it down and keep it in a log. 2) Take your medicines as prescribed 3) Eat low salt foods-Limit salt (sodium) to 2000 mg per day.  4) Stay as active as you can everyday 5) Limit all fluids for the day to less than 2 liters   At the [facility], you and your health needs are our priority. As part of our continuing mission to provide you with exceptional heart care, we have created designated Provider Care Teams. These Care Teams include your primary Cardiologist (physician) and Advanced Practice Providers (APPs- Physician  Assistants and Nurse Practitioners) who all work together to provide you with the care you need, when you need it.   You may see any of the following providers on your designated Care Team at your next follow up: Marland Kitchen Dr Glori Bickers . Dr Loralie Champagne . Darrick Grinder, NP . Lyda Jester, PA . Audry Riles, PharmD   Please be sure to bring in all your medications bottles to every appointment.

## 2020-12-20 NOTE — Progress Notes (Signed)
  Echocardiogram 2D Echocardiogram has been performed.  Jennette Dubin 12/20/2020, 9:40 AM

## 2020-12-21 ENCOUNTER — Other Ambulatory Visit: Payer: Self-pay

## 2020-12-21 ENCOUNTER — Ambulatory Visit
Admission: RE | Admit: 2020-12-21 | Discharge: 2020-12-21 | Disposition: A | Discharge status: Home / Self Care | Payer: HMO | Source: Ambulatory Visit | Attending: Radiation Oncology | Admitting: Radiation Oncology

## 2020-12-21 DIAGNOSIS — C50511 Malignant neoplasm of lower-outer quadrant of right female breast: Secondary | ICD-10-CM | POA: Diagnosis not present

## 2020-12-21 DIAGNOSIS — Z170 Estrogen receptor positive status [ER+]: Secondary | ICD-10-CM | POA: Diagnosis not present

## 2020-12-21 DIAGNOSIS — Z510 Encounter for antineoplastic radiation therapy: Secondary | ICD-10-CM | POA: Diagnosis not present

## 2020-12-21 NOTE — Progress Notes (Signed)
PCP: Dr. Nancy Fetter Cardiology: Dr. Tamala Julian HF Cardiology: Dr. Aundra Dubin Oncology: Dr. Lindi Adie  Toni [name] is a 65 y.o. [employer] employee with a history of chronic systolic heart failure, due to nonischemic cardiomyopathy, HTN, DM, and hyperlipidemia.   Initially diagnosed with NICM in 1998 thought to be from HTN versus viral. Had cath in 1998 that was negative for coronary disease. EF at that time was 20% but EF recovered in 2012.   In October 2019, she had a cough/virus and she took OTC meds. Says she would feel better for a little while but then felt bad again. 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  She presented to [facility] ED on 11/13/18 with increased shortness of breath. She was admitted and echo was completed showing EF had gone back down to 15%. She had RHC/LHC with nonobstructive CAD and relatively preserved cardiac output. She was diuresed in the hospital and discharged. CPX in 2/20 showed only mild HF limitation.   Given family history of cardiomyopathy, I sent genetic testing.  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  I had her see Dr. Broadus John, we think that the variant may be benign and unrelated to her cardiomyopathy.  She has no history of conduction disturbance.   Echo in 6/20 showed that EF remains low at 25-30%, mild LV dilation, mildly decreased RV systolic function.  She had a [place] placed.   8/21 diagnosed with right breast cancer, ER+/PR-/HER2+.  She had right lumpectomy.   Echo in 10/21 showed EF 40% with mildly decreased RV systolic function. She has been getting Taxol + Herceptin.  Repeat echo in 11/21 showed EF 30% with mild LV dilation, mildly decreased RV systolic function. Herceptin was stopped and it was decided that she would be a poor candidate for it in the future.   Echo was done today and reviewed, EF 25-30%, diffuse  hypokinesis, mildly decreased RV systolic function.   She returns for followup of CHF. Currently getting radiation.  No dyspnea walking on flat ground.  Mild dyspnea with stairs.  She walks on her treadmill at home for 40 minutes several times a week.  Occasional mild lightheadedness with standing, no syncope or falls. No orthopnea/PND.  Weight is stable.   [employer] device interrogation: Heartlogic score 3, no VT    ECG (personally reviewed): NSR, LAFB, LVH, poor RWP  Labs (1/20): K 4, creatinine 0.67 => 0.74 Labs (3/20): K 4.1, creatinine 0.87 Labs (6/20): digoxin level 0.9 Labs (7/20): K 4.1, creatinine 0.74 Labs (8/20): digoxin 0.4 Labs (11/20): K 4.5, creatinine 0.65, digoxin 1.2 Labs (2/21): digoxin 0.4, K 4.2, creatinine 0.9 Labs (6/21): K 5.1, creatinine 0.83 Labs (9/21): K 4.6, creatinine 0.89 Labs (11/21): K 4, creatinine 0.78 Labs (1/22): K 4.4, creatinine 0.89  PMH: 1. HTN 2. Type 2 diabetes 3. Hyperlipidemia 4. Chronic systolic CHF: Nonischemic cardiomyopathy.  Diagnosed in 1998, EF 20% by echo at that time.  Echo back to normal range by 2012.   - LHC/RHC (12/19): D1 60-70% stenosis; mean RA 6, PA 58/22, mean PCWP 22, CI 2.9.  - Echo (12/19): EF 15% with severe LV dilation, moderate central MR likely functional.  - Cardiac MRI (12/19): Moderate LV dilation with EF 14%, mild RV dilation with EF 17%, LGE at the inferior RV insertion site (nonspecific).  - CPX (2/20): peak VO2 18.6, VE/VCO2 31,  RER 1.18 => mild HF limitation.  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  Suspect the variant is benign.  - Echo (6/20): EF 25-30%, mild LV dilation, mildly decreased RV systolic function.  - Echo (10/21): EF 40%, diffuse hypokinesis, mildly decreased RV systolic function.  - Echo (11/21): EF 30%, diffuse hypokinesis, mildly decreased RV systolic function.  - Echo (1/22): EF 25-30%, diffuse hypokinesis, mildly  decreased RV systolic function.  5. Angioedema with ACEI 6. Left shoulder adhesive capsulitis 7. Depression 8. Breast cancer: 8/21 diagnosed with right breast cancer, ER+/PR-/HER2+.  She had right lumpectomy.  Radiation ongoing.   Social History   Socioeconomic History  . Marital status: Single    Spouse name: Not on file  . Number of children: Not on file  . Years of education: 53  . Highest education level: Bachelor's degree (e.g., BA, AB, BS)  Occupational History  . Occupation: Surveyor, quantity: [employer]  Tobacco Use  . Smoking status: Never Smoker  . Smokeless tobacco: Never Used  Vaping Use  . Vaping Use: Never used  Substance and Sexual Activity  . Alcohol use: Yes    Alcohol/week: 0.0 standard drinks    Comment: less than once a month  . Drug use: No  . Sexual activity: Never  Other Topics Concern  . Not on file  Social History Narrative   Works at [employer].  Lives alone.     Social Determinants of Health   Financial Resource Strain: Low Risk   . Difficulty of Paying Living Expenses: Not hard at all  Food Insecurity: No Food Insecurity  . Worried About Charity fundraiser in the Last Year: Never true  . Ran Out of Food in the Last Year: Never true  Transportation Needs: No Transportation Needs  . Lack of Transportation (Medical): No  . Lack of Transportation (Non-Medical): No  Physical Activity: Not on file  Stress: Not on file  Social Connections: Not on file  Intimate Partner Violence: Not At Risk  . Fear of Current or Ex-Partner: No  . Emotionally Abused: No  . Physically Abused: No  . Sexually Abused: No   Family History  Problem Relation Age of Onset  . Diabetes Mellitus I Mother   . Lung cancer Mother 50  . Breast cancer Mother        dx. late 30s/early 49s  . Diabetes Mellitus I Father   . Sudden death Father 39  . Prostate cancer Brother 77  . Hypertension Brother   . Hypertension Brother   . Diabetes Mellitus I Brother   .  Benign prostatic hyperplasia Brother   . Heart failure Paternal Uncle   . Thyroid cancer Niece        dx. in her 32s  . Breast cancer Cousin        dx. in her 49s, recurrence in her 92s (maternal first cousin)  . Breast cancer Cousin        female dx. in his early 19s (paternal first cousin)  . Cancer Cousin        dx. in his early 79s, unknown type (paternal first cousin)  . Cancer Cousin 40       unknown type (paternal first cousin)   ROS: All systems reviewed and negative except as per HPI.   Current Outpatient Medications  Medication Sig Dispense Refill  . aspirin 81 MG chewable tablet Chew 1 tablet (81 mg total)  by mouth daily. 30 tablet 0  . carvedilol (COREG) 25 MG tablet TAKE 1 TABLET (25 MG TOTAL) BY MOUTH 2 (TWO) TIMES DAILY WITH A MEAL. 180 tablet 3  . cetirizine (ZYRTEC) 10 MG tablet Take 10 mg by mouth daily.     . Continuous Blood Gluc Receiver (FREESTYLE LIBRE 14 DAY READER) DEVI Apply topically as directed.    . Continuous Blood Gluc Sensor (FREESTYLE LIBRE 14 DAY SENSOR) MISC Apply topically as directed.    . digoxin (LANOXIN) 0.125 MG tablet Take 0.5 tablets (0.0625 mg total) by mouth daily. 45 tablet 1  . furosemide (LASIX) 20 MG tablet Take 20 mg by mouth daily as needed for fluid or edema.     Marland Kitchen glipiZIDE (GLUCOTROL XL) 10 MG 24 hr tablet Take 20 mg by mouth daily with breakfast.     . hydrALAZINE (APRESOLINE) 50 MG tablet TAKE ONE TABLET BY MOUTH THREE TIMES DAILY 90 tablet 0  . insulin glargine (LANTUS) 100 UNIT/ML injection Inject 12 Units into the skin daily.    . isosorbide dinitrate (ISORDIL) 20 MG tablet TAKE 1 TABLET (20 MG TOTAL) BY MOUTH 3 TIMES DAILY. 90 tablet 11  . JARDIANCE 25 MG TABS tablet Take 25 mg by mouth daily.    Marland Kitchen LINZESS 145 MCG CAPS capsule Take 145 mcg by mouth daily as needed (constipation). Constipation    . losartan (COZAAR) 50 MG tablet Take 1 tablet (50 mg total) by mouth in the morning and at bedtime. 60 tablet 6  . metFORMIN  (GLUCOPHAGE) 1000 MG tablet Take 1,000 mg by mouth 2 (two) times daily with a meal.    . Multiple Vitamin (MULTIVITAMIN WITH MINERALS) TABS tablet Take 1 tablet by mouth daily. Centrum Silver    . ondansetron (ZOFRAN) 8 MG tablet Take 1 tablet (8 mg total) by mouth 2 (two) times daily as needed (Nausea or vomiting). 30 tablet 1  . OZEMPIC, 1 MG/DOSE, 4 MG/3ML SOPN Inject 1 mg into the skin every Saturday.     . prochlorperazine (COMPAZINE) 10 MG tablet Take 1 tablet (10 mg total) by mouth every 6 (six) hours as needed (Nausea or vomiting). 30 tablet 1  . simvastatin (ZOCOR) 20 MG tablet Take 20 mg by mouth at bedtime.     Marland Kitchen spironolactone (ALDACTONE) 25 MG tablet TAKE 1 TABLET BY MOUTH EVERY EVENING. 90 tablet 3  . traMADol (ULTRAM) 50 MG tablet Take 1-2 tablets (50-100 mg total) by mouth every 6 (six) hours as needed for moderate pain. 20 tablet 0  . TRUE METRIX BLOOD GLUCOSE TEST test strip 1 each by Other route as directed.   5  . TRUEPLUS LANCETS 30G MISC 1 each by Other route as directed. Use as directed.  5  . UNIFINE PENTIPS 32G X 4 MM MISC 1 each by Other route as directed.      No current facility-administered medications for this encounter.   BP (!) 90/50   Pulse 67   Wt 69.7 kg (153 lb 9.6 oz)   SpO2 100%   BMI 25.56 kg/m  General: NAD Neck: No JVD, no thyromegaly or thyroid nodule.  Lungs: Clear to auscultation bilaterally with normal respiratory effort. CV: Nondisplaced PMI.  Heart regular S1/S2, no S3/S4, no murmur.  No peripheral edema.  No carotid bruit.  Normal pedal pulses.  Abdomen: Soft, nontender, no hepatosplenomegaly, no distention.  Skin: Intact without lesions or rashes.  Neurologic: Alert and oriented x 3.  Psych: Normal affect. Extremities: No clubbing  or cyanosis.  HEENT: Normal.   Assessment/Plan: 1. Chronic systolic CHF: Nonischemic cardiomyopathy by 12/19 cath.  Cardiac MRI with LV EF 14%, RV EF 17%. No definite evidence for myocarditis or infiltrative  disease by delayed enhancement images.  Most likely cause of cardiomyopathy is familial versus prior viral myocarditis.  Brother also had a cardiomyopathy of uncertain etiology.  Genetic testing was done, showing an LMNA gene variant of uncertain significance => she saw Dr. Broadus John, suspect benign/uninvolved variant (no conduction abnormality).  CPX in 2/20 showed only mild HF limitation.  Echo in 6/20 showed EF 25-30%. Echo in 10/21 showed EF up some to 40%.  She now has a [place].  She was started on Taxol/Herceptin chemotherapy for breast cancer and 11/21 echo showed fall in EF to 30%.  It was decided that she would not be a candidate for ongoing Herceptin and this medication was stopped.  Today's echo shows EF 25-30%, diffuse hypokinesis, mildly decreased RV systolic function.  On exam and by Heartlogic, she is not volume overloaded.  NYHA class II symptoms.  Narrow QRS, not CRT candidate. She does not have BP room for medication titration.  - Continue Coreg 25 mg bid.   - Continue losartan 50 mg bid.  She cannot take Entresto with history of ACEI angioedema.   - Continue Jardiance.   - She will continue to take Lasix only prn.   - Continue spironolactone 25 mg daily. BMET today.  - Continue digoxin, check level.  - Continue hydralazine 50 mg tid and isordil 20 mg tid.  - When radiation treatment is completed, would consider her for barostimulator activation therapy.  - I will arrange for CPX.  2. Type II diabetes: She is on Jardiance.  3. Breast cancer: HER2+.  Ideally, would have treatment with Taxol/Herceptin followed by Herceptin alone to complete a year.  She started Herceptin and Taxol chemotherapy.  EF fell to 30% and Herceptin was stopped.  I do not think Herceptin will be a good option for her going forwards.    Followup in 3 months.    Loralie Champagne 12/21/2020

## 2020-12-22 ENCOUNTER — Ambulatory Visit
Admission: RE | Admit: 2020-12-22 | Discharge: 2020-12-22 | Disposition: A | Discharge status: Home / Self Care | Payer: HMO | Source: Ambulatory Visit | Attending: Radiation Oncology | Admitting: Radiation Oncology

## 2020-12-22 ENCOUNTER — Other Ambulatory Visit: Payer: Self-pay

## 2020-12-22 DIAGNOSIS — Z170 Estrogen receptor positive status [ER+]: Secondary | ICD-10-CM | POA: Diagnosis not present

## 2020-12-22 DIAGNOSIS — Z510 Encounter for antineoplastic radiation therapy: Secondary | ICD-10-CM | POA: Diagnosis not present

## 2020-12-22 DIAGNOSIS — C50511 Malignant neoplasm of lower-outer quadrant of right female breast: Secondary | ICD-10-CM | POA: Diagnosis not present

## 2020-12-23 ENCOUNTER — Other Ambulatory Visit: Payer: Self-pay

## 2020-12-23 ENCOUNTER — Other Ambulatory Visit (HOSPITAL_COMMUNITY): Payer: Self-pay | Admitting: Cardiology

## 2020-12-23 ENCOUNTER — Ambulatory Visit
Admission: RE | Admit: 2020-12-23 | Discharge: 2020-12-23 | Disposition: A | Discharge status: Home / Self Care | Payer: HMO | Source: Ambulatory Visit | Attending: Radiation Oncology | Admitting: Radiation Oncology

## 2020-12-23 DIAGNOSIS — Z170 Estrogen receptor positive status [ER+]: Secondary | ICD-10-CM | POA: Diagnosis not present

## 2020-12-23 DIAGNOSIS — C50511 Malignant neoplasm of lower-outer quadrant of right female breast: Secondary | ICD-10-CM | POA: Diagnosis not present

## 2020-12-23 DIAGNOSIS — Z510 Encounter for antineoplastic radiation therapy: Secondary | ICD-10-CM | POA: Diagnosis not present

## 2020-12-26 ENCOUNTER — Other Ambulatory Visit: Payer: Self-pay

## 2020-12-26 ENCOUNTER — Ambulatory Visit
Admission: RE | Admit: 2020-12-26 | Discharge: 2020-12-26 | Disposition: A | Discharge status: Home / Self Care | Payer: HMO | Source: Ambulatory Visit | Attending: Radiation Oncology | Admitting: Radiation Oncology

## 2020-12-26 DIAGNOSIS — C50511 Malignant neoplasm of lower-outer quadrant of right female breast: Secondary | ICD-10-CM | POA: Diagnosis not present

## 2020-12-26 DIAGNOSIS — Z510 Encounter for antineoplastic radiation therapy: Secondary | ICD-10-CM | POA: Diagnosis not present

## 2020-12-26 DIAGNOSIS — Z170 Estrogen receptor positive status [ER+]: Secondary | ICD-10-CM | POA: Diagnosis not present

## 2020-12-27 ENCOUNTER — Ambulatory Visit
Admission: RE | Admit: 2020-12-27 | Discharge: 2020-12-27 | Disposition: A | Discharge status: Home / Self Care | Payer: HMO | Source: Ambulatory Visit | Attending: Radiation Oncology | Admitting: Radiation Oncology

## 2020-12-27 ENCOUNTER — Other Ambulatory Visit: Payer: Self-pay

## 2020-12-27 DIAGNOSIS — C50511 Malignant neoplasm of lower-outer quadrant of right female breast: Secondary | ICD-10-CM | POA: Diagnosis not present

## 2020-12-27 DIAGNOSIS — Z510 Encounter for antineoplastic radiation therapy: Secondary | ICD-10-CM | POA: Diagnosis not present

## 2020-12-27 DIAGNOSIS — Z170 Estrogen receptor positive status [ER+]: Secondary | ICD-10-CM | POA: Diagnosis not present

## 2020-12-28 ENCOUNTER — Ambulatory Visit (INDEPENDENT_AMBULATORY_CARE_PROVIDER_SITE_OTHER): Payer: HMO

## 2020-12-28 ENCOUNTER — Ambulatory Visit
Admission: RE | Admit: 2020-12-28 | Discharge: 2020-12-28 | Disposition: A | Discharge status: Home / Self Care | Payer: HMO | Source: Ambulatory Visit | Attending: Radiation Oncology | Admitting: Radiation Oncology

## 2020-12-28 DIAGNOSIS — I428 Other cardiomyopathies: Secondary | ICD-10-CM

## 2020-12-28 DIAGNOSIS — Z510 Encounter for antineoplastic radiation therapy: Secondary | ICD-10-CM | POA: Diagnosis not present

## 2020-12-28 DIAGNOSIS — C50511 Malignant neoplasm of lower-outer quadrant of right female breast: Secondary | ICD-10-CM | POA: Diagnosis not present

## 2020-12-28 DIAGNOSIS — Z170 Estrogen receptor positive status [ER+]: Secondary | ICD-10-CM | POA: Diagnosis not present

## 2020-12-29 ENCOUNTER — Other Ambulatory Visit: Payer: Self-pay

## 2020-12-29 ENCOUNTER — Ambulatory Visit
Admission: RE | Admit: 2020-12-29 | Discharge: 2020-12-29 | Disposition: A | Discharge status: Home / Self Care | Payer: HMO | Source: Ambulatory Visit | Attending: Radiation Oncology | Admitting: Radiation Oncology

## 2020-12-29 DIAGNOSIS — Z170 Estrogen receptor positive status [ER+]: Secondary | ICD-10-CM | POA: Diagnosis not present

## 2020-12-29 DIAGNOSIS — Z510 Encounter for antineoplastic radiation therapy: Secondary | ICD-10-CM | POA: Diagnosis not present

## 2020-12-29 DIAGNOSIS — C50511 Malignant neoplasm of lower-outer quadrant of right female breast: Secondary | ICD-10-CM | POA: Diagnosis not present

## 2020-12-30 ENCOUNTER — Other Ambulatory Visit: Payer: Self-pay

## 2020-12-30 ENCOUNTER — Ambulatory Visit
Admission: RE | Admit: 2020-12-30 | Discharge: 2020-12-30 | Disposition: A | Discharge status: Home / Self Care | Payer: HMO | Source: Ambulatory Visit | Attending: Radiation Oncology | Admitting: Radiation Oncology

## 2020-12-30 DIAGNOSIS — Z510 Encounter for antineoplastic radiation therapy: Secondary | ICD-10-CM | POA: Diagnosis not present

## 2020-12-30 DIAGNOSIS — C50511 Malignant neoplasm of lower-outer quadrant of right female breast: Secondary | ICD-10-CM | POA: Diagnosis not present

## 2020-12-30 DIAGNOSIS — Z170 Estrogen receptor positive status [ER+]: Secondary | ICD-10-CM | POA: Diagnosis not present

## 2020-12-30 LAB — CUP PACEART REMOTE DEVICE CHECK
Battery Remaining Longevity: 162 mo
Battery Remaining Percentage: 100 %
Brady Statistic RV Percent Paced: 0 %
Date Time Interrogation Session: 20220209220800
HighPow Impedance: 70 Ohm
Implantable Lead Implant Date: 20200730
Implantable Lead Location: 753860
Implantable Lead Model: 292
Implantable Lead Serial Number: 447014
Implantable Pulse Generator Implant Date: 20200730
Lead Channel Impedance Value: 453 Ohm
Lead Channel Pacing Threshold Amplitude: 1.4 V
Lead Channel Pacing Threshold Pulse Width: 0.4 ms
Lead Channel Setting Pacing Amplitude: 3.5 V
Lead Channel Setting Pacing Pulse Width: 0.4 ms
Lead Channel Setting Sensing Sensitivity: 0.5 mV
Pulse Gen Serial Number: 266091

## 2021-01-02 ENCOUNTER — Ambulatory Visit
Admission: RE | Admit: 2021-01-02 | Discharge: 2021-01-02 | Disposition: A | Discharge status: Home / Self Care | Payer: HMO | Source: Ambulatory Visit | Attending: Radiation Oncology | Admitting: Radiation Oncology

## 2021-01-02 DIAGNOSIS — Z170 Estrogen receptor positive status [ER+]: Secondary | ICD-10-CM | POA: Diagnosis not present

## 2021-01-02 DIAGNOSIS — Z510 Encounter for antineoplastic radiation therapy: Secondary | ICD-10-CM | POA: Diagnosis not present

## 2021-01-02 DIAGNOSIS — C50511 Malignant neoplasm of lower-outer quadrant of right female breast: Secondary | ICD-10-CM | POA: Diagnosis not present

## 2021-01-03 ENCOUNTER — Other Ambulatory Visit: Payer: Self-pay

## 2021-01-03 ENCOUNTER — Ambulatory Visit
Admission: RE | Admit: 2021-01-03 | Discharge: 2021-01-03 | Disposition: A | Discharge status: Home / Self Care | Payer: HMO | Source: Ambulatory Visit | Attending: Radiation Oncology | Admitting: Radiation Oncology

## 2021-01-03 DIAGNOSIS — C50511 Malignant neoplasm of lower-outer quadrant of right female breast: Secondary | ICD-10-CM | POA: Diagnosis not present

## 2021-01-03 DIAGNOSIS — Z510 Encounter for antineoplastic radiation therapy: Secondary | ICD-10-CM | POA: Diagnosis not present

## 2021-01-03 DIAGNOSIS — Z170 Estrogen receptor positive status [ER+]: Secondary | ICD-10-CM | POA: Diagnosis not present

## 2021-01-03 NOTE — Progress Notes (Signed)
Remote ICD transmission.   

## 2021-01-04 ENCOUNTER — Other Ambulatory Visit: Payer: Self-pay

## 2021-01-04 ENCOUNTER — Other Ambulatory Visit (HOSPITAL_COMMUNITY): Payer: Self-pay | Admitting: *Deleted

## 2021-01-04 ENCOUNTER — Ambulatory Visit
Admission: RE | Admit: 2021-01-04 | Discharge: 2021-01-04 | Disposition: A | Discharge status: Home / Self Care | Payer: HMO | Source: Ambulatory Visit | Attending: Radiation Oncology | Admitting: Radiation Oncology

## 2021-01-04 DIAGNOSIS — C50511 Malignant neoplasm of lower-outer quadrant of right female breast: Secondary | ICD-10-CM | POA: Diagnosis not present

## 2021-01-04 DIAGNOSIS — E785 Hyperlipidemia, unspecified: Secondary | ICD-10-CM | POA: Diagnosis not present

## 2021-01-04 DIAGNOSIS — Z510 Encounter for antineoplastic radiation therapy: Secondary | ICD-10-CM | POA: Diagnosis not present

## 2021-01-04 DIAGNOSIS — E1169 Type 2 diabetes mellitus with other specified complication: Secondary | ICD-10-CM | POA: Diagnosis not present

## 2021-01-04 DIAGNOSIS — I509 Heart failure, unspecified: Secondary | ICD-10-CM | POA: Diagnosis not present

## 2021-01-04 DIAGNOSIS — I10 Essential (primary) hypertension: Secondary | ICD-10-CM | POA: Diagnosis not present

## 2021-01-04 DIAGNOSIS — Z170 Estrogen receptor positive status [ER+]: Secondary | ICD-10-CM | POA: Diagnosis not present

## 2021-01-04 MED ORDER — HYDRALAZINE HCL 50 MG PO TABS: 50.0000 mg | ORAL_TABLET | Freq: Three times a day (TID) | ORAL | 6 refills | Status: DC

## 2021-01-05 ENCOUNTER — Ambulatory Visit
Admission: RE | Admit: 2021-01-05 | Discharge: 2021-01-05 | Disposition: A | Discharge status: Home / Self Care | Payer: HMO | Source: Ambulatory Visit | Attending: Radiation Oncology | Admitting: Radiation Oncology

## 2021-01-05 DIAGNOSIS — Z170 Estrogen receptor positive status [ER+]: Secondary | ICD-10-CM | POA: Diagnosis not present

## 2021-01-05 DIAGNOSIS — C50511 Malignant neoplasm of lower-outer quadrant of right female breast: Secondary | ICD-10-CM | POA: Diagnosis not present

## 2021-01-05 DIAGNOSIS — Z510 Encounter for antineoplastic radiation therapy: Secondary | ICD-10-CM | POA: Diagnosis not present

## 2021-01-06 ENCOUNTER — Ambulatory Visit
Admission: RE | Admit: 2021-01-06 | Discharge: 2021-01-06 | Disposition: A | Discharge status: Home / Self Care | Payer: HMO | Source: Ambulatory Visit | Attending: Radiation Oncology | Admitting: Radiation Oncology

## 2021-01-06 ENCOUNTER — Encounter: Payer: Self-pay | Admitting: Hematology and Oncology

## 2021-01-06 ENCOUNTER — Other Ambulatory Visit: Payer: Self-pay

## 2021-01-06 DIAGNOSIS — Z510 Encounter for antineoplastic radiation therapy: Secondary | ICD-10-CM | POA: Diagnosis not present

## 2021-01-06 NOTE — Progress Notes (Signed)
Called patient to provide grant details for remaining funds.  Emailed a copy of the expense sheet.  She has my contact name and number for any additional financial questions or concerns.

## 2021-01-09 ENCOUNTER — Other Ambulatory Visit: Payer: Self-pay

## 2021-01-09 ENCOUNTER — Ambulatory Visit
Admission: RE | Admit: 2021-01-09 | Discharge: 2021-01-09 | Disposition: A | Discharge status: Home / Self Care | Payer: HMO | Source: Ambulatory Visit | Attending: Radiation Oncology | Admitting: Radiation Oncology

## 2021-01-09 DIAGNOSIS — Z510 Encounter for antineoplastic radiation therapy: Secondary | ICD-10-CM | POA: Diagnosis not present

## 2021-01-10 ENCOUNTER — Encounter: Payer: Self-pay | Admitting: *Deleted

## 2021-01-10 ENCOUNTER — Other Ambulatory Visit: Payer: Self-pay

## 2021-01-10 ENCOUNTER — Ambulatory Visit
Admission: RE | Admit: 2021-01-10 | Discharge: 2021-01-10 | Disposition: A | Discharge status: Home / Self Care | Payer: HMO | Source: Ambulatory Visit | Attending: Radiation Oncology | Admitting: Radiation Oncology

## 2021-01-10 DIAGNOSIS — Z510 Encounter for antineoplastic radiation therapy: Secondary | ICD-10-CM | POA: Diagnosis not present

## 2021-01-11 ENCOUNTER — Ambulatory Visit
Admission: RE | Admit: 2021-01-11 | Discharge: 2021-01-11 | Disposition: A | Discharge status: Home / Self Care | Payer: HMO | Source: Ambulatory Visit | Attending: Radiation Oncology | Admitting: Radiation Oncology

## 2021-01-11 ENCOUNTER — Other Ambulatory Visit: Payer: Self-pay

## 2021-01-11 ENCOUNTER — Encounter: Payer: Self-pay | Admitting: Radiation Oncology

## 2021-01-11 DIAGNOSIS — Z510 Encounter for antineoplastic radiation therapy: Secondary | ICD-10-CM | POA: Diagnosis not present

## 2021-01-11 DIAGNOSIS — C50511 Malignant neoplasm of lower-outer quadrant of right female breast: Secondary | ICD-10-CM | POA: Diagnosis not present

## 2021-01-11 DIAGNOSIS — Z170 Estrogen receptor positive status [ER+]: Secondary | ICD-10-CM | POA: Diagnosis not present

## 2021-01-12 NOTE — Progress Notes (Signed)
Patient Care Team: Donald Prose, MD as PCP - General (Family Medicine) Belva Crome, MD as PCP - Cardiology (Cardiology) Larey Dresser, MD as PCP - Advanced Heart Failure (Cardiology) Mauro Kaufmann, RN as Registered Nurse Rockwell Germany, RN as Oncology Nurse Navigator Coralie Keens, MD as Consulting Physician (General Surgery) Nicholas Lose, MD as Consulting Physician (Hematology and Oncology) Kyung Rudd, MD as Consulting Physician (Radiation Oncology)  DIAGNOSIS:    ICD-10-CM   1. Malignant neoplasm of lower-outer quadrant of right breast of female, estrogen receptor positive ([place])  C50.511    Z17.0     SUMMARY OF ONCOLOGIC HISTORY: Oncology History  Malignant neoplasm of lower-outer quadrant of right breast of female, estrogen receptor positive ([place])  07/01/2020 Initial Diagnosis   Screening mammogram showed a right breast mass. Mammogram and US showed a 0.7cm mass at the 6 o'clock position in the right breast, no axillary adenopathy. 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    07/06/2020 Cancer Staging   Staging form: Breast, AJCC 8th Edition - Clinical stage from 07/06/2020: Stage IB (cT1b, cN0, cM0, G3, ER+, PR-, HER2-) - Signed by Nicholas Lose, MD on 07/06/2020   07/26/2020 Genetic Testing   Negative genetic testing:  No pathogenic variants detected on the [facility] Common Hereditary Cancers Panel. The report date is 07/26/2020.   The Common Hereditary Cancers Panel offered by [facility] includes sequencing and/or deletion duplication testing of the following 48 genes: APC, ATM, AXIN2, BARD1, BMPR1A, BRCA1, BRCA2, BRIP1, CDH1, CDK4, CDKN2A (p14ARF), CDKN2A (p16INK4a), CHEK2, CTNNA1, DICER1, EPCAM (Deletion/duplication testing only), GREM1 (promoter region deletion/duplication testing only), KIT, MEN1, MLH1, MSH2, MSH3, MSH6, MUTYH, NBN, NF1, NTHL1, PALB2, PDGFRA, PMS2, POLD1, POLE, PTEN, RAD50,  RAD51C, RAD51D, RNF43, SDHB, SDHC, SDHD, SMAD4, SMARCA4. STK11, TP53, TSC1, TSC2, and VHL.  The following genes were evaluated for sequence changes only: SDHA and HOXB13 c.251G>A variant only.   08/11/2020 Surgery   Right lumpectomy [name]): IDC, grade 3, 0.8cm, clear margins, 3 right axillary lymph nodes negative for carcinoma. ER 30% weak, PR 0%, HER-2 positive, Ki-67 40%   08/18/2020 Cancer Staging   Staging form: Breast, AJCC 8th Edition - Pathologic stage from 08/18/2020: Stage IA (pT1b, pN0, cM0, G3, ER+, PR-, HER2+) - Signed by Gardenia Phlegm, NP on 10/12/2020   09/08/2020 -  Adjuvant Chemotherapy   Weekly Taxol/herceptin x 12, followed by herceptin maintenance x 1 year; Herceptin stopped after first 5 weekly cycles due to decline in EF from 40% to 30%.       CHIEF COMPLIANT: Follow-up to discuss antiestrogen therapy  INTERVAL HISTORY: Toni [name] is a 65 y.o. with above-mentioned history of breast cancerwhounderwent a right lumpectomy, adjuvant chemotherapy, and completed radiation on 01/11/21. She presents to the clinic today to discuss antiestrogen therapy.    ALLERGIES:  is allergic to biaxin [clarithromycin], enalapril maleate, and vasotec [enalapril].  MEDICATIONS:  Current Outpatient Medications  Medication Sig Dispense Refill   aspirin 81 MG chewable tablet Chew 1 tablet (81 mg total) by mouth daily. 30 tablet 0   carvedilol (COREG) 25 MG tablet TAKE 1 TABLET (25 MG TOTAL) BY MOUTH 2 (TWO) TIMES DAILY WITH A MEAL. 180 tablet 3   cetirizine (ZYRTEC) 10 MG tablet Take 10 mg by mouth daily.      Continuous Blood Gluc Receiver (FREESTYLE LIBRE 14 DAY READER) DEVI Apply topically as directed.     Continuous Blood  Gluc Sensor (FREESTYLE LIBRE 14 DAY SENSOR) MISC Apply topically as directed.     digoxin (LANOXIN) 0.125 MG tablet TAKE 1/2 TABLET BY MOUTH ONCE DAILY 45 tablet 0   furosemide (LASIX) 20 MG tablet Take 20 mg by mouth daily as needed for fluid  or edema.      glipiZIDE (GLUCOTROL XL) 10 MG 24 hr tablet Take 20 mg by mouth daily with breakfast.      hydrALAZINE (APRESOLINE) 50 MG tablet Take 1 tablet (50 mg total) by mouth 3 (three) times daily. 90 tablet 6   insulin glargine (LANTUS) 100 UNIT/ML injection Inject 12 Units into the skin daily.     isosorbide dinitrate (ISORDIL) 20 MG tablet TAKE 1 TABLET (20 MG TOTAL) BY MOUTH 3 TIMES DAILY. 90 tablet 11   JARDIANCE 25 MG TABS tablet Take 25 mg by mouth daily.     LINZESS 145 MCG CAPS capsule Take 145 mcg by mouth daily as needed (constipation). Constipation     losartan (COZAAR) 50 MG tablet Take 1 tablet (50 mg total) by mouth in the morning and at bedtime. 60 tablet 6   metFORMIN (GLUCOPHAGE) 1000 MG tablet Take 1,000 mg by mouth 2 (two) times daily with a meal.     Multiple Vitamin (MULTIVITAMIN WITH MINERALS) TABS tablet Take 1 tablet by mouth daily. Centrum Silver     ondansetron (ZOFRAN) 8 MG tablet Take 1 tablet (8 mg total) by mouth 2 (two) times daily as needed (Nausea or vomiting). 30 tablet 1   OZEMPIC, 1 MG/DOSE, 4 MG/3ML SOPN Inject 1 mg into the skin every Saturday.      prochlorperazine (COMPAZINE) 10 MG tablet Take 1 tablet (10 mg total) by mouth every 6 (six) hours as needed (Nausea or vomiting). 30 tablet 1   simvastatin (ZOCOR) 20 MG tablet Take 20 mg by mouth at bedtime.      spironolactone (ALDACTONE) 25 MG tablet TAKE 1 TABLET BY MOUTH EVERY EVENING. 90 tablet 3   traMADol (ULTRAM) 50 MG tablet Take 1-2 tablets (50-100 mg total) by mouth every 6 (six) hours as needed for moderate pain. 20 tablet 0   TRUE METRIX BLOOD GLUCOSE TEST test strip 1 each by Other route as directed.   5   TRUEPLUS LANCETS 30G MISC 1 each by Other route as directed. Use as directed.  5   UNIFINE PENTIPS 32G X 4 MM MISC 1 each by Other route as directed.      No current facility-administered medications for this visit.    PHYSICAL EXAMINATION: ECOG PERFORMANCE STATUS: 1  - Symptomatic but completely ambulatory  Vitals:   01/13/21 1035  BP: 104/65  Pulse: 90  Resp: 18  Temp: 97.7 F (36.5 C)  SpO2: 98%   Filed Weights   01/13/21 1035  Weight: 157 lb (71.2 kg)     LABORATORY DATA:  I have reviewed the data as listed CMP Latest Ref Rng & Units 12/20/2020 11/24/2020 11/17/2020  Glucose 70 - 99 mg/dL 137(H) 154(H) 183(H)  BUN 8 - 23 mg/dL 17 16 18   Creatinine 0.44 - 1.00 mg/dL 0.79 0.89 1.04(H)  Sodium 135 - 145 mmol/L 140 140 140  Potassium 3.5 - 5.1 mmol/L 4.8 4.4 4.8  Chloride 98 - 111 mmol/L 103 106 105  CO2 22 - 32 mmol/L 26 23 30   Calcium 8.9 - 10.3 mg/dL 9.9 10.0 10.3  Total Protein 6.5 - 8.1 g/dL - 7.6 7.7  Total Bilirubin 0.3 - 1.2 mg/dL - 0.6  0.3  Alkaline Phos 38 - 126 U/L - 53 50  AST 15 - 41 U/L - 16 15  ALT 0 - 44 U/L - 18 19    Lab Results  Component Value Date   WBC 4.5 11/24/2020   HGB 13.0 11/24/2020   HCT 39.5 11/24/2020   MCV 90.6 11/24/2020   PLT 320 11/24/2020   NEUTROABS 2.3 11/24/2020    ASSESSMENT & PLAN:  Malignant neoplasm of lower-outer quadrant of right breast of female, estrogen receptor positive ([place]) 07/01/2020:Screening mammogram showed a right breast mass. Mammogram and US showed a 0.7cm mass at the 6 o'clock position in the right breast, no axillary adenopathy. 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 T1BN0 stage Ia  08/11/2020:Right lumpectomy [name]): IDC, grade 3, 0.8cm, clear margins, 3 right axillary lymph nodes negative for carcinoma.ER 30% week, PR 0%, HER-2 positive, Ki-67 40%  Treatment plan: 1.adjuvant Taxol Herceptin/Herceptin discontinued after 5 cycles due to worsening cardiomyopathy, Taxol completed 11/24/20 2.Adjuvant radiation therapy completed 01/11/21 3.Follow-up adjuvant antiestrogen therapy Patient is participating in the neuropathy clinical trial SWOG  S1714 --------------------------------------------------------------------------------------------------------------------------------------- Current treatment: Anti estrogen therapy with anastrozole to start 01/13/2021  She has a grade 1 peripheral sensory neuropathy.  echocardiogram on 12/20/2020. EF 25-30%: She follows with Dr. Algernon Huxley. Cannot resume Herceptin. I will request Dr. [name] to remove the port.  RTC in 3 months for survivorship care plan visit.  No orders of the defined types were placed in this encounter.  The patient has a good understanding of the overall plan. she agrees with it. she will call with any problems that may develop before the next visit here.  Total time spent: 30 mins including face to face time and time spent for planning, charting and coordination of care  Rulon Eisenmenger, MD, MPH 01/13/2021  I, Molly Dorshimer, am acting as scribe for Dr. Nicholas Lose.  I have reviewed the above documentation for accuracy and completeness, and I agree with the above.

## 2021-01-12 NOTE — Assessment & Plan Note (Signed)
07/01/2020:Screening mammogram showed a right breast mass. Mammogram and US showed a 0.7cm mass at the 6 o'clock position in the right breast, no axillary adenopathy. 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 T1BN0 stage Ia  08/11/2020:Right lumpectomy [name]): IDC, grade 3, 0.8cm, clear margins, 3 right axillary lymph nodes negative for carcinoma.ER 30% week, PR 0%, HER-2 positive, Ki-67 40%  Treatment plan: 1.adjuvant Taxol Herceptin/Herceptin discontinued after 5 cycles due to worsening cardiomyopathy, Taxol completed 11/24/20 2.Adjuvant radiation therapy completed 01/11/21 3.Follow-up adjuvant antiestrogen therapy Patient is participating in the neuropathy clinical trial SWOG S1714 --------------------------------------------------------------------------------------------------------------------------------------- Current treatment: Anti estrogen therapy with Anastrozole  She has a grade 1 peripheral sensory neuropathy.  echocardiogram on 12/20/2020. EF 25-30% Cannot resume Herceptin.  RTC in 3 months

## 2021-01-13 ENCOUNTER — Encounter: Payer: Self-pay | Admitting: *Deleted

## 2021-01-13 ENCOUNTER — Other Ambulatory Visit: Payer: Self-pay

## 2021-01-13 ENCOUNTER — Inpatient Hospital Stay: Payer: HMO | Attending: Hematology and Oncology | Admitting: Hematology and Oncology

## 2021-01-13 DIAGNOSIS — Z7984 Long term (current) use of oral hypoglycemic drugs: Secondary | ICD-10-CM | POA: Insufficient documentation

## 2021-01-13 DIAGNOSIS — Z794 Long term (current) use of insulin: Secondary | ICD-10-CM | POA: Diagnosis not present

## 2021-01-13 DIAGNOSIS — Z79899 Other long term (current) drug therapy: Secondary | ICD-10-CM | POA: Diagnosis not present

## 2021-01-13 DIAGNOSIS — Z7982 Long term (current) use of aspirin: Secondary | ICD-10-CM | POA: Diagnosis not present

## 2021-01-13 DIAGNOSIS — C50511 Malignant neoplasm of lower-outer quadrant of right female breast: Secondary | ICD-10-CM | POA: Diagnosis not present

## 2021-01-13 DIAGNOSIS — Z923 Personal history of irradiation: Secondary | ICD-10-CM | POA: Diagnosis not present

## 2021-01-13 DIAGNOSIS — Z9221 Personal history of antineoplastic chemotherapy: Secondary | ICD-10-CM | POA: Insufficient documentation

## 2021-01-13 DIAGNOSIS — Z170 Estrogen receptor positive status [ER+]: Secondary | ICD-10-CM

## 2021-01-13 MED ORDER — ANASTROZOLE 1 MG PO TABS: 1.0000 mg | ORAL_TABLET | Freq: Every day | ORAL | 3 refills | Status: DC

## 2021-01-16 ENCOUNTER — Telehealth: Payer: Self-pay | Admitting: Hematology and Oncology

## 2021-01-16 ENCOUNTER — Other Ambulatory Visit: Payer: Self-pay | Admitting: Surgery

## 2021-01-16 NOTE — Telephone Encounter (Signed)
Scheduled per 2/25 los. Called and spoke with pt confirmed 5/26 appt

## 2021-01-16 NOTE — Progress Notes (Signed)
  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  Intent: Curative  Radiation Treatment Dates: 12/15/2020 through 01/11/2021 Site Technique Total Dose (Gy) Dose per Fx (Gy) Completed Fx Beam Energies  Breast, Right: Breast_Rt 3D 42.56/42.56 2.66 16/16 6X  Breast, Right: Breast_Rt_Bst specialPort 8/8 2 4/4 9E, 12E   Narrative: The patient tolerated radiation therapy relatively well. She developed anticipated skin change in the treatment field.  Plan: The patient will receive a call in about one month from the radiation oncology department. She will continue follow up with Dr. Lindi Adie as well.   ________________________________________________    Carola Rhine, [facility]

## 2021-01-17 ENCOUNTER — Ambulatory Visit (HOSPITAL_COMMUNITY): Payer: HMO | Attending: Cardiology

## 2021-01-17 ENCOUNTER — Other Ambulatory Visit: Payer: Self-pay

## 2021-01-17 DIAGNOSIS — I5022 Chronic systolic (congestive) heart failure: Secondary | ICD-10-CM | POA: Diagnosis not present

## 2021-01-18 ENCOUNTER — Telehealth (HOSPITAL_COMMUNITY): Payer: Self-pay

## 2021-01-18 NOTE — Telephone Encounter (Signed)
-----   Message from Larey Dresser, MD sent at 01/17/2021  3:54 PM EST ----- No more than mild HF limitation.

## 2021-01-18 NOTE — Telephone Encounter (Signed)
Patient aware.

## 2021-01-24 NOTE — Progress Notes (Signed)
DUE TO COVID-19 ONLY ONE VISITOR IS ALLOWED TO COME WITH YOU AND STAY IN THE WAITING ROOM ONLY DURING PRE OP AND PROCEDURE DAY OF SURGERY. THE 1 VISITOR  MAY VISIT WITH YOU AFTER SURGERY IN YOUR PRIVATE ROOM DURING VISITING HOURS ONLY!  YOU NEED TO HAVE A COVID 19 TEST ON__3/11/22_____ @_245PM______ , THIS TEST MUST BE DONE BEFORE SURGERY,  COVID TESTING SITE [address] [place] [address], IT IS ON THE RIGHT GOING OUT [address] APPROXIMATELY  2 MINUTES PAST [employer] ON THE RIGHT. ONCE YOUR COVID TEST IS COMPLETED,  PLEASE BEGIN THE QUARANTINE INSTRUCTIONS AS OUTLINED IN YOUR HANDOUT.                Toni [name]  01/24/2021   Your procedure is scheduled on: 01/31/2021    Report to [facility] Main  Entrance   Report to admitting at    55 NOON    Call this number if you have problems the morning of surgery [phone]    REMEMBER: NO  SOLID FOOD CANDY OR GUM AFTER MIDNIGHT. CLEAR LIQUIDS UNTIL  1100AM        . NOTHING BY MOUTH EXCEPT CLEAR LIQUIDS UNTIL    . PLEASE FINISH ENSURE DRINK PER SURGEON ORDER  WHICH NEEDS TO BE COMPLETED AT 1100AM      .    EAT A GOOD HEALTHY SNACK PRIOR TO BEDTIME   CLEAR LIQUID DIET   Foods Allowed                                                                    Coffee and tea, regular and decaf                            Fruit ices (not with fruit pulp)                                      Iced Popsicles                                    Carbonated beverages, regular and diet                                    Cranberry, grape and apple juices Sports drinks like Gatorade Lightly seasoned clear broth or consume(fat free) Sugar, honey syrup ___________________________________________________________________      BRUSH YOUR TEETH MORNING OF SURGERY AND RINSE YOUR MOUTH OUT, NO CHEWING GUM CANDY OR MINTS.     Take these medicines the morning of surgery with A SIP OF WATER: ISORDIL, aRMIMDEX, cOREG, zYRTEC,  lANOXIN, hYDRALAZINE  nO JARDIANCE DAY BEFORE SURGERY  DO NOT TAKE ANY DIABETIC MEDICATIONS DAY OF YOUR SURGERY                               You may not have any metal on your body including hair pins and  piercings  Do not wear jewelry, make-up, lotions, powders or perfumes, deodorant             Do not wear nail polish on your fingernails.  Do not shave  48 hours prior to surgery.              Men may shave face and neck.   Do not bring valuables to the hospital. [place].  Contacts, dentures or bridgework may not be worn into surgery.  Leave suitcase in the car. After surgery it may be brought to your room.     Patients discharged the day of surgery will not be allowed to drive home. IF YOU ARE HAVING SURGERY AND GOING HOME THE SAME DAY, YOU MUST HAVE AN ADULT TO DRIVE YOU HOME AND BE WITH YOU FOR 24 HOURS. YOU MAY GO HOME BY TAXI OR UBER OR ORTHERWISE, BUT AN ADULT MUST ACCOMPANY YOU HOME AND STAY WITH YOU FOR 24 HOURS.  Name and phone number of your driver:  Special Instructions: N/A              Please read over the following fact sheets you were given: _____________________________________________________________________  [facility] - Preparing for Surgery Before surgery, you can play an important role.  Because skin is not sterile, your skin needs to be as free of germs as possible.  You can reduce the number of germs on your skin by washing with CHG (chlorahexidine gluconate) soap before surgery.  CHG is an antiseptic cleaner which kills germs and bonds with the skin to continue killing germs even after washing. Please DO NOT use if you have an allergy to CHG or antibacterial soaps.  If your skin becomes reddened/irritated stop using the CHG and inform your nurse when you arrive at Short Stay. Do not shave (including legs and underarms) for at least 48 hours prior to the first CHG shower.  You may shave your  face/neck. Please follow these instructions carefully:  1.  Shower with CHG Soap the night before surgery and the  morning of Surgery.  2.  If you choose to wash your hair, wash your hair first as usual with your  normal  shampoo.  3.  After you shampoo, rinse your hair and body thoroughly to remove the  shampoo.                           4.  Use CHG as you would any other liquid soap.  You can apply chg directly  to the skin and wash                       Gently with a scrungie or clean washcloth.  5.  Apply the CHG Soap to your body ONLY FROM THE NECK DOWN.   Do not use on face/ open                           Wound or open sores. Avoid contact with eyes, ears mouth and genitals (private parts).                       Wash face,  Genitals (private parts) with your normal soap.             6.  Wash thoroughly, paying special attention to the area where your surgery  will be performed.  7.  Thoroughly rinse your body with warm water from the neck down.  8.  DO NOT shower/wash with your normal soap after using and rinsing off  the CHG Soap.                9.  Pat yourself dry with a clean towel.            10.  Wear clean pajamas.            11.  Place clean sheets on your bed the night of your first shower and do not  sleep with pets. Day of Surgery : Do not apply any lotions/deodorants the morning of surgery.  Please wear clean clothes to the hospital/surgery center.  FAILURE TO FOLLOW THESE INSTRUCTIONS MAY RESULT IN THE CANCELLATION OF YOUR SURGERY PATIENT SIGNATURE_________________________________  NURSE SIGNATURE__________________________________  ________________________________________________________________________

## 2021-01-26 ENCOUNTER — Telehealth (HOSPITAL_COMMUNITY): Payer: Self-pay | Admitting: *Deleted

## 2021-01-26 NOTE — Telephone Encounter (Signed)
Pt left VM to confirm her losartan dose. I called pt back no answer/left detailed vm. Per Dr.McLeans last office note pt should be on losartan 50mg  bid.

## 2021-01-27 ENCOUNTER — Encounter (HOSPITAL_COMMUNITY)
Admission: RE | Admit: 2021-01-27 | Discharge: 2021-01-27 | Disposition: A | Discharge status: Home / Self Care | Payer: HMO | Source: Ambulatory Visit | Attending: Surgery | Admitting: Surgery

## 2021-01-27 ENCOUNTER — Other Ambulatory Visit: Payer: Self-pay

## 2021-01-27 ENCOUNTER — Other Ambulatory Visit (HOSPITAL_COMMUNITY)
Admission: RE | Admit: 2021-01-27 | Discharge: 2021-01-27 | Disposition: A | Discharge status: Home / Self Care | Payer: HMO | Source: Ambulatory Visit | Attending: Surgery | Admitting: Surgery

## 2021-01-27 ENCOUNTER — Encounter (HOSPITAL_COMMUNITY): Payer: Self-pay

## 2021-01-27 ENCOUNTER — Encounter: Payer: Self-pay | Admitting: Internal Medicine

## 2021-01-27 DIAGNOSIS — Z20822 Contact with and (suspected) exposure to covid-19: Secondary | ICD-10-CM | POA: Diagnosis not present

## 2021-01-27 DIAGNOSIS — Z01812 Encounter for preprocedural laboratory examination: Secondary | ICD-10-CM | POA: Insufficient documentation

## 2021-01-27 DIAGNOSIS — Z853 Personal history of malignant neoplasm of breast: Secondary | ICD-10-CM | POA: Insufficient documentation

## 2021-01-27 DIAGNOSIS — Z79899 Other long term (current) drug therapy: Secondary | ICD-10-CM | POA: Diagnosis not present

## 2021-01-27 DIAGNOSIS — Z7984 Long term (current) use of oral hypoglycemic drugs: Secondary | ICD-10-CM | POA: Insufficient documentation

## 2021-01-27 DIAGNOSIS — E119 Type 2 diabetes mellitus without complications: Secondary | ICD-10-CM | POA: Insufficient documentation

## 2021-01-27 DIAGNOSIS — I509 Heart failure, unspecified: Secondary | ICD-10-CM | POA: Diagnosis not present

## 2021-01-27 DIAGNOSIS — Z950 Presence of cardiac pacemaker: Secondary | ICD-10-CM | POA: Insufficient documentation

## 2021-01-27 DIAGNOSIS — I110 Hypertensive heart disease with heart failure: Secondary | ICD-10-CM | POA: Diagnosis not present

## 2021-01-27 DIAGNOSIS — Z7982 Long term (current) use of aspirin: Secondary | ICD-10-CM | POA: Diagnosis not present

## 2021-01-27 LAB — CBC
HCT: 44.9 % (ref 36.0–46.0)
Hemoglobin: 14.1 g/dL (ref 12.0–15.0)
MCH: 30 pg (ref 26.0–34.0)
MCHC: 31.4 g/dL (ref 30.0–36.0)
MCV: 95.5 fL (ref 80.0–100.0)
Platelets: 265 10*3/uL (ref 150–400)
RBC: 4.7 MIL/uL (ref 3.87–5.11)
RDW: 13 % (ref 11.5–15.5)
WBC: 5.5 10*3/uL (ref 4.0–10.5)
nRBC: 0 % (ref 0.0–0.2)

## 2021-01-27 LAB — BASIC METABOLIC PANEL
Anion gap: 8 (ref 5–15)
BUN: 15 mg/dL (ref 8–23)
CO2: 26 mmol/L (ref 22–32)
Calcium: 9.5 mg/dL (ref 8.9–10.3)
Chloride: 104 mmol/L (ref 98–111)
Creatinine, Ser: 0.72 mg/dL (ref 0.44–1.00)
GFR, Estimated: >60 mL/min (ref >60)
Glucose, Bld: 155 mg/dL — ABNORMAL HIGH (ref 70–99)
Potassium: 4.2 mmol/L (ref 3.5–5.1)
Sodium: 138 mmol/L (ref 135–145)

## 2021-01-27 LAB — HEMOGLOBIN A1C
Hgb A1c MFr Bld: 7.6 % — ABNORMAL HIGH (ref 4.8–5.6)
Mean Plasma Glucose: 171.42 mg/dL

## 2021-01-27 LAB — SARS CORONAVIRUS 2 (TAT 6-24 HRS): SARS Coronavirus 2: NEGATIVE

## 2021-01-27 NOTE — Progress Notes (Signed)
PERIOPERATIVE PRESCRIPTION FOR IMPLANTED CARDIAC DEVICE PROGRAMMING   Patient Information: Toni [name]  DOB- 09/22/1956  MRn- [id number]  DOS- 01/31/21  Procedure: Removal of Port a Cath  DR [facility]     Cautery will be used.  Position during surgery:   Please send documentation back to:  [facility] (Fax # [phone])  [facility]  Athena Masse, RN  01/27/2021 2:52 PM       Device Information:   Clinic EP Physician:   Cristopher Peru, MD Device Type:  Defibrillator Manufacturer and Phone #:  [employer]: [phone] Pacemaker Dependent?:  No Date of Last Device Check:  overdue     Normal Device Function?:  unknown   Electrophysiologist's Recommendations:    Have magnet available.  Provide continuous ECG monitoring when magnet is used or reprogramming is to be performed.     [employer] to interrogate prior to case due to patient overdue for follow-up.  Per Device Clinic Standing Orders, Drake Leach  01/27/2021 4:02 PM

## 2021-01-27 NOTE — Progress Notes (Addendum)
Anesthesia Review:  PCP: DR Margaretha Sheffield  Cardiologist :DR Aundra Dubin  LOV 12/20/20 Chest x-ray :08/31/20- One view  Last Device check- 12/30/20  EKG :12/30/20 Echo :12/20/20  Stress test:01/17/21  Cardiac Cath :  Activity level: no  Sleep Study/ CPAP : Fasting Blood Sugar :      / Checks Blood Sugar -- times a day:   Blood Thinner/ Instructions /Last Dose: ASA / Instructions/ Last Dose : DM-type 2  hgba1c-01/27/21-  7.6 Has Freestyle Libre for blood glucose monitoring 81 mg Aspirin  Requested device orders on 01/27/21.

## 2021-01-30 NOTE — H&P (Signed)
Toni [name] is an 65 y.o. female.   Chief Complaint: port no longer needed HPI: she has completed chemotherapy so her port a cath is no longer needed.  She is doing well otherwise  Past Medical History:  Diagnosis Date  . AICD (automatic cardioverter/defibrillator) present   . Cancer [facility])    Right sided breast cancer  . CHF (congestive heart failure) ([place])   . Diabetes mellitus without complication ([place])    type 2   . Family history of breast cancer   . Family history of prostate cancer   . Family history of thyroid cancer   . Hypertension     Past Surgical History:  Procedure Laterality Date  . ABDOMINAL HYSTERECTOMY    . BREAST LUMPECTOMY WITH RADIOACTIVE SEED AND SENTINEL LYMPH NODE BIOPSY Right 08/11/2020   Procedure: RIGHT BREAST LUMPECTOMY WITH RADIOACTIVE SEED AND SENTINEL LYMPH NODE BIOPSY;  Surgeon: Coralie Keens, MD;  Location: [place];  Service: General;  Laterality: Right;  . CARDIAC CATHETERIZATION     in [facility], clean, per pt.  . ICD IMPLANT N/A 06/18/2019   Procedure: ICD IMPLANT;  Surgeon: Evans Lance, MD;  Location: [place] [facility];  Service: Cardiovascular;  Laterality: N/A;  . PORTACATH PLACEMENT N/A 08/31/2020   Procedure: INSERTION PORT-A-CATH WITH ULTRASOUND GUIDANCE;  Surgeon: Coralie Keens, MD;  Location: [place];  Service: General;  Laterality: N/A;  . RIGHT/LEFT HEART CATH AND CORONARY ANGIOGRAPHY N/A 11/17/2018   Procedure: RIGHT/LEFT HEART CATH AND CORONARY ANGIOGRAPHY;  Surgeon: Belva Crome, MD;  Location: [place] [facility];  Service: Cardiovascular;  Laterality: N/A;    Family History  Problem Relation Age of Onset  . Diabetes Mellitus I Mother   . Lung cancer Mother 52  . Breast cancer Mother        dx. late 30s/early 65s  . Diabetes Mellitus I Father   . Sudden death Father 44  . Prostate cancer Brother 107  . Hypertension Brother   . Hypertension Brother   . Diabetes Mellitus I Brother   . Benign prostatic  hyperplasia Brother   . Heart failure Paternal Uncle   . Thyroid cancer Niece        dx. in her 49s  . Breast cancer Cousin        dx. in her 7s, recurrence in her 44s (maternal first cousin)  . Breast cancer Cousin        female dx. in his early 2s (paternal first cousin)  . Cancer Cousin        dx. in his early 28s, unknown type (paternal first cousin)  . Cancer Cousin 70       unknown type (paternal first cousin)   Social History:  reports that she has never smoked. She has never used smokeless tobacco. She reports current alcohol use. She reports that she does not use drugs.  Allergies:  Allergies  Allergen Reactions  . Biaxin [Clarithromycin] Nausea Only  . Vasotec [Enalapril] Other (See Comments)    Lip swelling / angioedema Tolerates ARB    No medications prior to admission.    No results found for this or any previous visit (from the past 48 hour(s)). No results found.  Review of Systems  All other systems reviewed and are negative.   There were no vitals taken for this visit. Physical Exam Constitutional:      General: She is not in acute distress.    Appearance: Normal appearance.  Eyes:  Pupils: Pupils are equal, round, and reactive to light.  Cardiovascular:     Rate and Rhythm: Normal rate and regular rhythm.  Pulmonary:     Effort: Pulmonary effort is normal.     Breath sounds: Normal breath sounds.  Abdominal:     General: Abdomen is flat.     Tenderness: There is no abdominal tenderness.  Musculoskeletal:     Cervical back: Normal range of motion.  Skin:    General: Skin is warm.  Neurological:     General: No focal deficit present.     Mental Status: She is alert.  Psychiatric:        Mood and Affect: Mood normal.      Assessment/Plan Port a cath in place, no longer needed.  History of breast cancer  Will proceed to the OR for port removal.  Procedure and risks were discussed.  She agrees to proceed  Coralie Keens,  MD 01/30/2021, 1:11 PM

## 2021-01-30 NOTE — Progress Notes (Signed)
Called pt in regards to [place] this pm and snack. Pt aware to only take 1/2 of dose of Basaglar if she has to take any Basaglar this pm as instructed at preop appt.  . PT voiced understanding.    Also reinforced to pt to eat a good bedtime snack this pm.  Pt stated she was going to eat a [place] sandwich at bedtime. Pt is going to check glucose before eating snack  Reinforced to pt to drink clear liquids until  1100am and to keep a check on glucose which she planned to do.

## 2021-01-30 NOTE — Progress Notes (Signed)
Anesthesia Chart Review   Case: [id number] Date/Time: 01/31/21 1345   Procedure: REMOVAL PORT-A-CATH (N/A )   Anesthesia type: Monitor Anesthesia Care   Pre-op diagnosis: PORT NO LONGER NEEDED, HISTORY OF BREAST CANCER   Location: [facility] ROOM 08 / [facility]   Surgeons: Coralie Keens, MD      DISCUSSION:64 y.o. never smoker with h/o HTN, DM II, CHF, AICD (device orders in progress note 01/27/21), scheduled for port-a-cath removal 01/31/2021 with Dr. Coralie Keens.   Followed by cardiology.  Last seen 12/20/20, stable at this visit with three month follow up recommended.  VS: BP 94/72   Pulse 100   Temp 36.9 C (Oral)   Resp 16   SpO2 100%   PROVIDERS: Donald Prose, MD is PCP   Loralie Champagne, MD is Cardiology  LABS: Labs reviewed: Acceptable for surgery. (all labs ordered are listed, but only abnormal results are displayed)  Labs Reviewed  BASIC METABOLIC PANEL - Abnormal; Notable for the following components:      Result Value   Glucose, Bld 155 (*)    All other components within normal limits  HEMOGLOBIN A1C - Abnormal; Notable for the following components:   Hgb A1c MFr Bld 7.6 (*)    All other components within normal limits  CBC     IMAGES:   EKG: 12/20/2020 Rate 94 bpm  Normal sinus rhythm Left axis deviation Moderate voltage criteria for LVH, may be normal variant ( R in aVL , Cornell product ) Anterior infarct , age undetermined Abnormal ECG Compared to previous tracing the rate is faster  CV: Echo 12/20/20 IMPRESSIONS    1. Left ventricular ejection fraction, by estimation, is 25 to 30%. The  left ventricle has severely decreased function. The left ventricle  demonstrates global hypokinesis. The left ventricular internal cavity size  was mildly dilated. Left ventricular  diastolic parameters are consistent with Grade I diastolic dysfunction  (impaired relaxation).  2. Right ventricular systolic function is mildly reduced. The right  ventricular size is  normal. Tricuspid regurgitation signal is inadequate  for assessing PA pressure.  3. The mitral valve is normal in structure. Mild mitral valve  regurgitation. No evidence of mitral stenosis.  4. The aortic valve is tricuspid. Aortic valve regurgitation is not  visualized. No aortic stenosis is present.  5. The inferior vena cava is normal in size with greater than 50%  respiratory variability, suggesting right atrial pressure of 3 mmHg. Past Medical History:  Diagnosis Date  . AICD (automatic cardioverter/defibrillator) present   . Cancer [facility])    Right sided breast cancer  . CHF (congestive heart failure) ([place])   . Diabetes mellitus without complication ([place])    type 2   . Family history of breast cancer   . Family history of prostate cancer   . Family history of thyroid cancer   . Hypertension     Past Surgical History:  Procedure Laterality Date  . ABDOMINAL HYSTERECTOMY    . BREAST LUMPECTOMY WITH RADIOACTIVE SEED AND SENTINEL LYMPH NODE BIOPSY Right 08/11/2020   Procedure: RIGHT BREAST LUMPECTOMY WITH RADIOACTIVE SEED AND SENTINEL LYMPH NODE BIOPSY;  Surgeon: Coralie Keens, MD;  Location: [place];  Service: General;  Laterality: Right;  . CARDIAC CATHETERIZATION     in [facility], clean, per pt.  . ICD IMPLANT N/A 06/18/2019   Procedure: ICD IMPLANT;  Surgeon: Evans Lance, MD;  Location: [place] [facility];  Service: Cardiovascular;  Laterality: N/A;  . PORTACATH PLACEMENT N/A  08/31/2020   Procedure: INSERTION PORT-A-CATH WITH ULTRASOUND GUIDANCE;  Surgeon: Coralie Keens, MD;  Location: [place];  Service: General;  Laterality: N/A;  . RIGHT/LEFT HEART CATH AND CORONARY ANGIOGRAPHY N/A 11/17/2018   Procedure: RIGHT/LEFT HEART CATH AND CORONARY ANGIOGRAPHY;  Surgeon: Belva Crome, MD;  Location: [place] [facility];  Service: Cardiovascular;  Laterality: N/A;    MEDICATIONS: . acetaminophen (TYLENOL) 500 MG tablet  . anastrozole (ARIMIDEX) 1 MG tablet  . aspirin  81 MG chewable tablet  . carvedilol (COREG) 25 MG tablet  . cetirizine (ZYRTEC) 10 MG tablet  . Continuous Blood Gluc Receiver (FREESTYLE LIBRE 14 DAY READER) DEVI  . Continuous Blood Gluc Sensor (FREESTYLE LIBRE 14 DAY SENSOR) MISC  . digoxin (LANOXIN) 0.125 MG tablet  . furosemide (LASIX) 20 MG tablet  . glipiZIDE (GLUCOTROL XL) 10 MG 24 hr tablet  . hydrALAZINE (APRESOLINE) 50 MG tablet  . Insulin Glargine (BASAGLAR KWIKPEN) 100 UNIT/ML  . isosorbide dinitrate (ISORDIL) 20 MG tablet  . JARDIANCE 25 MG TABS tablet  . LINZESS 145 MCG CAPS capsule  . losartan (COZAAR) 25 MG tablet  . losartan (COZAAR) 50 MG tablet  . Melatonin 5 MG CAPS  . metFORMIN (GLUCOPHAGE) 1000 MG tablet  . Multiple Vitamin (MULTIVITAMIN WITH MINERALS) TABS tablet  . ondansetron (ZOFRAN) 8 MG tablet  . OVER THE COUNTER MEDICATION  . OZEMPIC, 1 MG/DOSE, 4 MG/3ML SOPN  . simvastatin (ZOCOR) 20 MG tablet  . spironolactone (ALDACTONE) 25 MG tablet  . TRUE METRIX BLOOD GLUCOSE TEST test strip  . TRUEPLUS LANCETS 30G MISC  . UNIFINE PENTIPS 32G X 4 MM MISC  . Wound Cleansers (RADIAPLEX EX)   No current facility-administered medications for this encounter.    Konrad Felix, PA-C [facility] Pre-Surgical Testing [phone]

## 2021-01-31 ENCOUNTER — Encounter (HOSPITAL_COMMUNITY): Payer: Self-pay | Admitting: Surgery

## 2021-01-31 ENCOUNTER — Ambulatory Visit (HOSPITAL_COMMUNITY): Payer: HMO | Admitting: Physician Assistant

## 2021-01-31 ENCOUNTER — Encounter (HOSPITAL_COMMUNITY)
Admission: RE | Disposition: A | Discharge status: Home / Self Care | Payer: Self-pay | Source: Home / Self Care | Attending: Surgery

## 2021-01-31 ENCOUNTER — Ambulatory Visit (HOSPITAL_COMMUNITY)
Admission: RE | Admit: 2021-01-31 | Discharge: 2021-01-31 | Disposition: A | Discharge status: Home / Self Care | Payer: HMO | Attending: Surgery | Admitting: Surgery

## 2021-01-31 ENCOUNTER — Ambulatory Visit (HOSPITAL_COMMUNITY): Payer: HMO

## 2021-01-31 DIAGNOSIS — Z9221 Personal history of antineoplastic chemotherapy: Secondary | ICD-10-CM | POA: Diagnosis not present

## 2021-01-31 DIAGNOSIS — Z95810 Presence of automatic (implantable) cardiac defibrillator: Secondary | ICD-10-CM | POA: Diagnosis not present

## 2021-01-31 DIAGNOSIS — Z888 Allergy status to other drugs, medicaments and biological substances status: Secondary | ICD-10-CM | POA: Insufficient documentation

## 2021-01-31 DIAGNOSIS — Z853 Personal history of malignant neoplasm of breast: Secondary | ICD-10-CM | POA: Insufficient documentation

## 2021-01-31 DIAGNOSIS — Z803 Family history of malignant neoplasm of breast: Secondary | ICD-10-CM | POA: Insufficient documentation

## 2021-01-31 DIAGNOSIS — Z452 Encounter for adjustment and management of vascular access device: Secondary | ICD-10-CM | POA: Insufficient documentation

## 2021-01-31 DIAGNOSIS — Z833 Family history of diabetes mellitus: Secondary | ICD-10-CM | POA: Insufficient documentation

## 2021-01-31 DIAGNOSIS — E876 Hypokalemia: Secondary | ICD-10-CM | POA: Diagnosis not present

## 2021-01-31 DIAGNOSIS — Z808 Family history of malignant neoplasm of other organs or systems: Secondary | ICD-10-CM | POA: Insufficient documentation

## 2021-01-31 DIAGNOSIS — Z881 Allergy status to other antibiotic agents status: Secondary | ICD-10-CM | POA: Insufficient documentation

## 2021-01-31 DIAGNOSIS — Z8042 Family history of malignant neoplasm of prostate: Secondary | ICD-10-CM | POA: Diagnosis not present

## 2021-01-31 DIAGNOSIS — I509 Heart failure, unspecified: Secondary | ICD-10-CM | POA: Diagnosis not present

## 2021-01-31 DIAGNOSIS — I110 Hypertensive heart disease with heart failure: Secondary | ICD-10-CM | POA: Insufficient documentation

## 2021-01-31 DIAGNOSIS — I428 Other cardiomyopathies: Secondary | ICD-10-CM | POA: Diagnosis not present

## 2021-01-31 DIAGNOSIS — Z801 Family history of malignant neoplasm of trachea, bronchus and lung: Secondary | ICD-10-CM | POA: Diagnosis not present

## 2021-01-31 HISTORY — PX: PORT-A-CATH REMOVAL: SHX5289

## 2021-01-31 LAB — GLUCOSE, CAPILLARY
Glucose-Capillary: 111 mg/dL — ABNORMAL HIGH (ref 70–99)
Glucose-Capillary: 87 mg/dL (ref 70–99)

## 2021-01-31 SURGERY — REMOVAL PORT-A-CATH
Anesthesia: Monitor Anesthesia Care

## 2021-01-31 MED ORDER — LIDOCAINE 2% (20 MG/ML) 5 ML SYRINGE
INTRAMUSCULAR | Status: DC | PRN
  Administered 2021-01-31: 25 mg via INTRAVENOUS

## 2021-01-31 MED ORDER — FENTANYL CITRATE (PF) 100 MCG/2ML IJ SOLN
INTRAMUSCULAR | Status: DC | PRN
  Administered 2021-01-31: 50 ug via INTRAVENOUS

## 2021-01-31 MED ORDER — CHLORHEXIDINE GLUCONATE CLOTH 2 % EX PADS: 6.0000 | MEDICATED_PAD | Freq: Once | CUTANEOUS | Status: DC

## 2021-01-31 MED ORDER — MIDAZOLAM HCL 5 MG/5ML IJ SOLN
INTRAMUSCULAR | Status: DC | PRN
  Administered 2021-01-31: 2 mg via INTRAVENOUS

## 2021-01-31 MED ORDER — LACTATED RINGERS IV SOLN: INTRAVENOUS | Status: DC

## 2021-01-31 MED ORDER — LIDOCAINE 1 % OPTIME INJ - NO CHARGE
INTRAMUSCULAR | Status: DC | PRN
  Administered 2021-01-31: 5 mL via INTRADERMAL

## 2021-01-31 MED ORDER — ENSURE PRE-SURGERY PO LIQD: 296.0000 mL | Freq: Once | ORAL | Status: DC

## 2021-01-31 MED ORDER — PROPOFOL 10 MG/ML IV BOLUS
INTRAVENOUS | Status: AC
  Filled 2021-01-31: qty 20

## 2021-01-31 MED ORDER — BUPIVACAINE HCL (PF) 0.5 % IJ SOLN
INTRAMUSCULAR | Status: AC
  Filled 2021-01-31: qty 30

## 2021-01-31 MED ORDER — FENTANYL CITRATE (PF) 100 MCG/2ML IJ SOLN: 25.0000 ug | INTRAMUSCULAR | Status: DC | PRN

## 2021-01-31 MED ORDER — PROPOFOL 500 MG/50ML IV EMUL
INTRAVENOUS | Status: DC | PRN
  Administered 2021-01-31: 75 ug/kg/min via INTRAVENOUS
  Administered 2021-01-31: 125 ug/kg/min via INTRAVENOUS

## 2021-01-31 MED ORDER — OXYCODONE HCL 5 MG PO TABS
5.0000 mg | ORAL_TABLET | Freq: Once | ORAL | Status: DC | PRN
Start: 2021-01-31 — End: 2021-01-31

## 2021-01-31 MED ORDER — CEFAZOLIN SODIUM-DEXTROSE 2-4 GM/100ML-% IV SOLN
2.0000 g | INTRAVENOUS | Status: AC
  Administered 2021-01-31: 2 g via INTRAVENOUS
  Filled 2021-01-31: qty 100

## 2021-01-31 MED ORDER — LIDOCAINE HCL (PF) 1 % IJ SOLN
INTRAMUSCULAR | Status: AC
  Filled 2021-01-31: qty 30

## 2021-01-31 MED ORDER — ORAL CARE MOUTH RINSE: 15.0000 mL | Freq: Once | OROMUCOSAL | Status: AC

## 2021-01-31 MED ORDER — OXYCODONE HCL 5 MG/5ML PO SOLN
5.0000 mg | Freq: Once | ORAL | Status: DC | PRN
Start: 2021-01-31 — End: 2021-01-31

## 2021-01-31 MED ORDER — ONDANSETRON HCL 4 MG/2ML IJ SOLN
INTRAMUSCULAR | Status: DC | PRN
  Administered 2021-01-31: 4 mg via INTRAVENOUS

## 2021-01-31 MED ORDER — ACETAMINOPHEN 500 MG PO TABS
1000.0000 mg | ORAL_TABLET | ORAL | Status: AC
  Administered 2021-01-31: 1000 mg via ORAL
  Filled 2021-01-31: qty 2

## 2021-01-31 MED ORDER — FENTANYL CITRATE (PF) 100 MCG/2ML IJ SOLN
INTRAMUSCULAR | Status: AC
  Filled 2021-01-31: qty 2

## 2021-01-31 MED ORDER — CHLORHEXIDINE GLUCONATE 0.12 % MT SOLN
15.0000 mL | Freq: Once | OROMUCOSAL | Status: AC
  Administered 2021-01-31: 15 mL via OROMUCOSAL

## 2021-01-31 MED ORDER — MIDAZOLAM HCL 2 MG/2ML IJ SOLN
INTRAMUSCULAR | Status: AC
  Filled 2021-01-31: qty 2

## 2021-01-31 MED ORDER — PHENYLEPHRINE 40 MCG/ML (10ML) SYRINGE FOR IV PUSH (FOR BLOOD PRESSURE SUPPORT)
PREFILLED_SYRINGE | INTRAVENOUS | Status: DC | PRN
  Administered 2021-01-31 (×2): 80 ug via INTRAVENOUS

## 2021-01-31 MED ORDER — ONDANSETRON HCL 4 MG/2ML IJ SOLN: 4.0000 mg | Freq: Once | INTRAMUSCULAR | Status: DC | PRN

## 2021-01-31 SURGICAL SUPPLY — 25 items
ADH SKN CLS APL DERMABOND .7 (GAUZE/BANDAGES/DRESSINGS) ×1
APL SKNCLS STERI-STRIP NONHPOA (GAUZE/BANDAGES/DRESSINGS) ×1
BENZOIN TINCTURE PRP APPL 2/3 (GAUZE/BANDAGES/DRESSINGS) ×2 IMPLANT
BLADE SURG 15 STRL LF DISP TIS (BLADE) ×1 IMPLANT
BLADE SURG 15 STRL SS (BLADE) ×2
BNDG ADH 1X3 SHEER STRL LF (GAUZE/BANDAGES/DRESSINGS) IMPLANT
BNDG ADH THN 3X1 STRL LF (GAUZE/BANDAGES/DRESSINGS)
DERMABOND ADVANCED (GAUZE/BANDAGES/DRESSINGS) ×1
DERMABOND ADVANCED .7 DNX12 (GAUZE/BANDAGES/DRESSINGS) ×1 IMPLANT
DRAPE LAPAROTOMY TRNSV 102X78 (DRAPES) ×2 IMPLANT
ELECT REM PT RETURN 15FT ADLT (MISCELLANEOUS) ×2 IMPLANT
GLOVE SURG ENC MOIS LTX SZ7.5 (GLOVE) ×2 IMPLANT
GOWN STRL REUS W/TWL XL LVL3 (GOWN DISPOSABLE) ×4 IMPLANT
KIT BASIN OR (CUSTOM PROCEDURE TRAY) ×2 IMPLANT
KIT TURNOVER KIT A (KITS) ×2 IMPLANT
NEEDLE HYPO 22GX1.5 SAFETY (NEEDLE) ×2 IMPLANT
PACK BASIC VI WITH GOWN DISP (CUSTOM PROCEDURE TRAY) ×2 IMPLANT
PENCIL SMOKE EVACUATOR (MISCELLANEOUS) IMPLANT
SPONGE LAP 4X18 RFD (DISPOSABLE) ×2 IMPLANT
SUT MNCRL AB 4-0 PS2 18 (SUTURE) ×2 IMPLANT
SUT VIC AB 3-0 SH 27 (SUTURE) ×2
SUT VIC AB 3-0 SH 27XBRD (SUTURE) ×1 IMPLANT
SYR 20ML LL LF (SYRINGE) ×2 IMPLANT
TOWEL OR 17X26 10 PK STRL BLUE (TOWEL DISPOSABLE) ×2 IMPLANT
TOWEL OR NON WOVEN STRL DISP B (DISPOSABLE) ×2 IMPLANT

## 2021-01-31 NOTE — Progress Notes (Signed)
Spoke with Adline Potter from [employer] , no need to interrogate since patient's port is on the opposite side and per Dr. Fransisco Beau, patient had recent interrogation 1 month ago and does not need to be interrogated today. No further orders received.

## 2021-01-31 NOTE — Interval H&P Note (Signed)
History and Physical Interval Note:no change in H and P  01/31/2021 1:30 PM  Toni [name]  has presented today for surgery, with the diagnosis of PORT NO LONGER NEEDED, HISTORY OF BREAST CANCER.  The various methods of treatment have been discussed with the patient and family. After consideration of risks, benefits and other options for treatment, the patient has consented to  Procedure(s): REMOVAL PORT-A-CATH (N/A) as a surgical intervention.  The patient's history has been reviewed, patient examined, no change in status, stable for surgery.  I have reviewed the patient's chart and labs.  Questions were answered to the patient's satisfaction.     Coralie Keens

## 2021-01-31 NOTE — Anesthesia Postprocedure Evaluation (Signed)
Anesthesia Post Note  Patient: Masyn Fullam  Procedure(s) Performed: REMOVAL PORT-A-CATH (N/A )     Patient location during evaluation: PACU Anesthesia Type: MAC Level of consciousness: awake and alert Pain management: pain level controlled Vital Signs Assessment: post-procedure vital signs reviewed and stable Respiratory status: spontaneous breathing, nonlabored ventilation and respiratory function stable Cardiovascular status: stable and blood pressure returned to baseline Postop Assessment: no apparent nausea or vomiting Anesthetic complications: no   No complications documented.  Last Vitals:  Vitals:   01/31/21 1500 01/31/21 1515  BP: 119/71 117/68  Pulse: 66 73  Resp: (!) 27 13  Temp:  36.6 C  SpO2: 97% 97%    Last Pain:  Vitals:   01/31/21 1515  TempSrc:   PainSc: 0-No pain                 Christopherjame Carnell,W. EDMOND

## 2021-01-31 NOTE — Op Note (Signed)
REMOVAL PORT-A-CATH  Procedure Note  [name] 01/31/2021   Pre-op Diagnosis: PORT NO LONGER NEEDED, HISTORY OF BREAST CANCER     Post-op Diagnosis: same  Procedure(s): REMOVAL PORT-A-CATH  Surgeon(s): Coralie Keens, MD  Anesthesia: Monitor Anesthesia Care  Staff:  Circulator: Mamie Levers, RN Scrub Person: Tsosie Billing  Estimated Blood Loss: Minimal               Procedure: The patient was brought to operating identifies correct patient.  She is placed upon the operating table and anesthesia was induced.  Her right chest and neck were prepped and draped in usual sterile fashion.  This was a right internal jugular Port-A-Cath.  I anesthetized skin the previous incision with lidocaine.  I made incision with a scalpel and took the incision down to the port with the cautery.  The port was easily identified and elevated out of the incision.  The previously placed Prolene sutures were cut and removed.  The port and catheter then easily slid out of the incision and internal jugular vein easily and in its entirety.  Hemostasis appeared to be achieved.  I closed the catheter site with a figure-of-eight 3-0 Vicryl suture.  I then closed the subcutaneous tissue with interrupted 3-0 Vicryl sutures and closed the skin with running 4-0 Monocryl.  Dermabond was then applied.  The patient tolerated the procedure well.  All the counts were correct at the end of the procedure.  The patient was then taken in stable addition from the operating room to the recovery room.          Coralie Keens   Date: 01/31/2021  Time: 2:26 PM

## 2021-01-31 NOTE — Discharge Instructions (Signed)
Ok to shower starting tomorrow  Ice pack, Tylenol, and ibuprofen for pain  No vigorous activity for a few days

## 2021-01-31 NOTE — Anesthesia Preprocedure Evaluation (Addendum)
Anesthesia Evaluation  Patient identified by MRN, date of birth, ID band Patient awake    Reviewed: Allergy & Precautions, NPO status , Patient's Chart, lab work & pertinent test results  History of Anesthesia Complications Negative for: history of anesthetic complications  Airway Mallampati: II  TM Distance: >3 FB Neck ROM: Full    Dental  (+) Dental Advisory Given, Chipped   Pulmonary neg pulmonary ROS,    Pulmonary exam normal        Cardiovascular hypertension, Pt. on medications and Pt. on home beta blockers + CAD and +CHF  Normal cardiovascular exam+ Cardiac Defibrillator    '22 TTE - EF 25 to 30%. Global hypokinesis. The left ventricular internal cavity size was mildly dilated. Grade I diastolic dysfunction (impaired relaxation). Right ventricular systolic function is mildly reduced. Mild mitral valve regurgitation.  AICD last checked 12/2020, no issues, per note next device check due in 91 days, well within that period      Neuro/Psych negative neurological ROS  negative psych ROS   GI/Hepatic negative GI ROS, Neg liver ROS,   Endo/Other  diabetes, Type 2, Insulin Dependent, Oral Hypoglycemic Agents  Renal/GU negative Renal ROS     Musculoskeletal negative musculoskeletal ROS (+)   Abdominal   Peds  Hematology negative hematology ROS (+)   Anesthesia Other Findings Covid test negative   Reproductive/Obstetrics  Breast cancer                             Anesthesia Physical Anesthesia Plan  ASA: III  Anesthesia Plan: MAC   Post-op Pain Management:    Induction: Intravenous  PONV Risk Score and Plan: 2 and Propofol infusion and Treatment may vary due to age or medical condition  Airway Management Planned: Natural Airway and Simple Face Mask  Additional Equipment: None  Intra-op Plan:   Post-operative Plan:   Informed Consent: I have reviewed the patients History  and Physical, chart, labs and discussed the procedure including the risks, benefits and alternatives for the proposed anesthesia with the patient or authorized representative who has indicated his/her understanding and acceptance.       Plan Discussed with: CRNA and Anesthesiologist  Anesthesia Plan Comments:        Anesthesia Quick Evaluation

## 2021-01-31 NOTE — Transfer of Care (Signed)
Immediate Anesthesia Transfer of Care Note  Patient: Toni [name]  Procedure(s) Performed: REMOVAL PORT-A-CATH (N/A )  Patient Location: PACU  Anesthesia Type:MAC  Level of Consciousness: awake, sedated and patient cooperative  Airway & Oxygen Therapy: Patient Spontanous Breathing and Patient connected to face mask oxygen  Post-op Assessment: Report given to RN and Post -op Vital signs reviewed and stable  Post vital signs: stable  Last Vitals:  Vitals Value Taken Time  BP 111/61 01/31/21 1432  Temp    Pulse 71 01/31/21 1434  Resp 15 01/31/21 1434  SpO2 100 % 01/31/21 1434  Vitals shown include unvalidated device data.  Last Pain:  Vitals:   01/31/21 1224  TempSrc:   PainSc: 0-No pain         Complications: No complications documented.

## 2021-02-01 ENCOUNTER — Encounter (HOSPITAL_COMMUNITY): Payer: Self-pay | Admitting: Surgery

## 2021-02-02 DIAGNOSIS — E785 Hyperlipidemia, unspecified: Secondary | ICD-10-CM | POA: Diagnosis not present

## 2021-02-02 DIAGNOSIS — C50511 Malignant neoplasm of lower-outer quadrant of right female breast: Secondary | ICD-10-CM | POA: Diagnosis not present

## 2021-02-02 DIAGNOSIS — I10 Essential (primary) hypertension: Secondary | ICD-10-CM | POA: Diagnosis not present

## 2021-02-02 DIAGNOSIS — I509 Heart failure, unspecified: Secondary | ICD-10-CM | POA: Diagnosis not present

## 2021-02-02 DIAGNOSIS — E1169 Type 2 diabetes mellitus with other specified complication: Secondary | ICD-10-CM | POA: Diagnosis not present

## 2021-02-19 ENCOUNTER — Other Ambulatory Visit (HOSPITAL_COMMUNITY): Payer: Self-pay | Admitting: Cardiology

## 2021-02-21 ENCOUNTER — Ambulatory Visit (HOSPITAL_COMMUNITY)
Admission: RE | Admit: 2021-02-21 | Discharge: 2021-02-21 | Disposition: A | Discharge status: Home / Self Care | Payer: HMO | Source: Ambulatory Visit | Attending: Cardiology | Admitting: Cardiology

## 2021-02-21 ENCOUNTER — Encounter (HOSPITAL_COMMUNITY): Payer: Self-pay | Admitting: Cardiology

## 2021-02-21 ENCOUNTER — Other Ambulatory Visit: Payer: Self-pay

## 2021-02-21 VITALS — BP 104/60 | HR 66 | Wt 150.6 lb

## 2021-02-21 DIAGNOSIS — I428 Other cardiomyopathies: Secondary | ICD-10-CM | POA: Diagnosis not present

## 2021-02-21 DIAGNOSIS — I5022 Chronic systolic (congestive) heart failure: Secondary | ICD-10-CM | POA: Diagnosis not present

## 2021-02-21 DIAGNOSIS — I509 Heart failure, unspecified: Secondary | ICD-10-CM

## 2021-02-21 DIAGNOSIS — I110 Hypertensive heart disease with heart failure: Secondary | ICD-10-CM | POA: Insufficient documentation

## 2021-02-21 DIAGNOSIS — E119 Type 2 diabetes mellitus without complications: Secondary | ICD-10-CM | POA: Insufficient documentation

## 2021-02-21 DIAGNOSIS — Z7982 Long term (current) use of aspirin: Secondary | ICD-10-CM | POA: Diagnosis not present

## 2021-02-21 DIAGNOSIS — Z79899 Other long term (current) drug therapy: Secondary | ICD-10-CM | POA: Diagnosis not present

## 2021-02-21 DIAGNOSIS — Z794 Long term (current) use of insulin: Secondary | ICD-10-CM | POA: Insufficient documentation

## 2021-02-21 DIAGNOSIS — E785 Hyperlipidemia, unspecified: Secondary | ICD-10-CM | POA: Insufficient documentation

## 2021-02-21 DIAGNOSIS — Z853 Personal history of malignant neoplasm of breast: Secondary | ICD-10-CM | POA: Diagnosis not present

## 2021-02-21 DIAGNOSIS — Z95810 Presence of automatic (implantable) cardiac defibrillator: Secondary | ICD-10-CM | POA: Insufficient documentation

## 2021-02-21 DIAGNOSIS — R42 Dizziness and giddiness: Secondary | ICD-10-CM | POA: Diagnosis not present

## 2021-02-21 LAB — BASIC METABOLIC PANEL
Anion gap: 8 (ref 5–15)
BUN: 16 mg/dL (ref 8–23)
CO2: 28 mmol/L (ref 22–32)
Calcium: 10.4 mg/dL — ABNORMAL HIGH (ref 8.9–10.3)
Chloride: 104 mmol/L (ref 98–111)
Creatinine, Ser: 0.83 mg/dL (ref 0.44–1.00)
GFR, Estimated: >60 mL/min (ref >60)
Glucose, Bld: 128 mg/dL — ABNORMAL HIGH (ref 70–99)
Potassium: 4.9 mmol/L (ref 3.5–5.1)
Sodium: 140 mmol/L (ref 135–145)

## 2021-02-21 LAB — DIGOXIN LEVEL: Digoxin Level: 0.5 ng/mL — ABNORMAL LOW (ref 0.8–2.0)

## 2021-02-21 NOTE — Progress Notes (Signed)
PCP: Dr. Nancy Fetter Cardiology: Dr. Tamala Julian HF Cardiology: Dr. Aundra Dubin Oncology: Dr. Lindi Adie  [name] is a 65 y.o. [employer] employee with a history of chronic systolic heart failure, due to nonischemic cardiomyopathy, HTN, DM, and hyperlipidemia.   Initially diagnosed with NICM in 1998 thought to be from HTN versus viral. Had cath in 1998 that was negative for coronary disease. EF at that time was 20% but EF recovered in 2012.   In October 2019, she had a cough/virus and she took OTC meds. Says she would feel better for a little while but then felt bad again. 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  She presented to [facility] ED on 11/13/18 with increased shortness of breath. She was admitted and echo was completed showing EF had gone back down to 15%. She had RHC/LHC with nonobstructive CAD and relatively preserved cardiac output. She was diuresed in the hospital and discharged. CPX in 2/20 showed only mild HF limitation.   Given family history of cardiomyopathy, I sent genetic testing.  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  I had her see Dr. Broadus John, we think that the variant may be benign and unrelated to her cardiomyopathy.  She has no history of conduction disturbance.   Echo in 6/20 showed that EF remains low at 25-30%, mild LV dilation, mildly decreased RV systolic function.  She had a [place] placed.   8/21 diagnosed with right breast cancer, ER+/PR-/HER2+.  She had right lumpectomy.   Echo in 10/21 showed EF 40% with mildly decreased RV systolic function. She has been getting Taxol + Herceptin.  Repeat echo in 11/21 showed EF 30% with mild LV dilation, mildly decreased RV systolic function. Herceptin was stopped and it was decided that she would be a poor candidate for it in the future.   Echo in 1/22 showed EF 25-30%, diffuse hypokinesis, mildly  decreased RV systolic function.  CPX in 3/22 showed mild HF limitation.   She returns for followup of CHF. She has finished radiation and her port is out. Weight is down 3 lbs.  No dyspnea walking on flat ground. Mild dyspnea walking up stairs.  Occasional lightheadedness with standing if she gets up too fast.  No orthopnea/PND.  No chest pain.   [employer] device interrogation: Heartlogic score 5, no VT    Labs (1/20): K 4, creatinine 0.67 => 0.74 Labs (3/20): K 4.1, creatinine 0.87 Labs (6/20): digoxin level 0.9 Labs (7/20): K 4.1, creatinine 0.74 Labs (8/20): digoxin 0.4 Labs (11/20): K 4.5, creatinine 0.65, digoxin 1.2 Labs (2/21): digoxin 0.4, K 4.2, creatinine 0.9 Labs (6/21): K 5.1, creatinine 0.83 Labs (9/21): K 4.6, creatinine 0.89 Labs (11/21): K 4, creatinine 0.78 Labs (1/22): K 4.4, creatinine 0.89 LabS (3/22): K 4.2, creatinine 0.72  PMH: 1. HTN 2. Type 2 diabetes 3. Hyperlipidemia 4. Chronic systolic CHF: Nonischemic cardiomyopathy.  Diagnosed in 1998, EF 20% by echo at that time.  Echo back to normal range by 2012.   - LHC/RHC (12/19): D1 60-70% stenosis; mean RA 6, PA 58/22, mean PCWP 22, CI 2.9.  - Echo (12/19): EF 15% with severe LV dilation, moderate central MR likely functional.  - Cardiac MRI (12/19): Moderate LV dilation with EF 14%, mild RV dilation with EF 17%, LGE at the inferior RV insertion site (nonspecific).  - CPX (2/20): peak VO2 18.6, VE/VCO2  31, RER 1.18 => mild HF limitation.  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  Suspect the variant is benign.  - Echo (6/20): EF 25-30%, mild LV dilation, mildly decreased RV systolic function.  - Echo (10/21): EF 40%, diffuse hypokinesis, mildly decreased RV systolic function.  - Echo (11/21): EF 30%, diffuse hypokinesis, mildly decreased RV systolic function.  - Echo (1/22): EF 25-30%, diffuse hypokinesis, mildly decreased RV systolic  function.  - CPX (3/22): Peak VO2 22, VE/VCO2 slope 33, RER 1.13.  Mild HF limitation.  5. Angioedema with ACEI 6. Left shoulder adhesive capsulitis 7. Depression 8. Breast cancer: 8/21 diagnosed with right breast cancer, ER+/PR-/HER2+.  She had right lumpectomy.  Radiation ongoing.   Social History   Socioeconomic History  . Marital status: Single    Spouse name: Not on file  . Number of children: Not on file  . Years of education: 60  . Highest education level: Bachelor's degree (e.g., BA, AB, BS)  Occupational History  . Occupation: Surveyor, quantity: [employer]  Tobacco Use  . Smoking status: Never Smoker  . Smokeless tobacco: Never Used  Vaping Use  . Vaping Use: Never used  Substance and Sexual Activity  . Alcohol use: Yes    Alcohol/week: 0.0 standard drinks    Comment: less than once a month  . Drug use: No  . Sexual activity: Never  Other Topics Concern  . Not on file  Social History Narrative   Works at [employer].  Lives alone.     Social Determinants of Health   Financial Resource Strain: Low Risk   . Difficulty of Paying Living Expenses: Not hard at all  Food Insecurity: No Food Insecurity  . Worried About Charity fundraiser in the Last Year: Never true  . Ran Out of Food in the Last Year: Never true  Transportation Needs: No Transportation Needs  . Lack of Transportation (Medical): No  . Lack of Transportation (Non-Medical): No  Physical Activity: Not on file  Stress: Not on file  Social Connections: Not on file  Intimate Partner Violence: Not At Risk  . Fear of Current or Ex-Partner: No  . Emotionally Abused: No  . Physically Abused: No  . Sexually Abused: No   Family History  Problem Relation Age of Onset  . Diabetes Mellitus I Mother   . Lung cancer Mother 47  . Breast cancer Mother        dx. late 30s/early 73s  . Diabetes Mellitus I Father   . Sudden death Father 16  . Prostate cancer Brother 24  . Hypertension Brother   .  Hypertension Brother   . Diabetes Mellitus I Brother   . Benign prostatic hyperplasia Brother   . Heart failure Paternal Uncle   . Thyroid cancer Niece        dx. in her 44s  . Breast cancer Cousin        dx. in her 65s, recurrence in her 33s (maternal first cousin)  . Breast cancer Cousin        female dx. in his early 63s (paternal first cousin)  . Cancer Cousin        dx. in his early 52s, unknown type (paternal first cousin)  . Cancer Cousin 47       unknown type (paternal first cousin)   ROS: All systems reviewed and negative except as per HPI.   Current Outpatient Medications  Medication Sig Dispense Refill  . acetaminophen (TYLENOL) 500 MG tablet Take 1,000 mg by mouth every 6 (six) hours as needed for moderate pain or headache.    . anastrozole (ARIMIDEX) 1 MG tablet Take 1 tablet (1 mg total) by mouth daily. 90 tablet 3  . aspirin 81 MG chewable tablet Chew 1 tablet (81 mg total) by mouth daily. 30 tablet 0  . carvedilol (COREG) 25 MG tablet TAKE 1 TABLET (25 MG TOTAL) BY MOUTH 2 (TWO) TIMES DAILY WITH A MEAL. 180 tablet 3  . cetirizine (ZYRTEC) 10 MG tablet Take 10 mg by mouth at bedtime.    . Continuous Blood Gluc Receiver (FREESTYLE LIBRE 14 DAY READER) DEVI Apply topically as directed.    . Continuous Blood Gluc Sensor (FREESTYLE LIBRE 14 DAY SENSOR) MISC Apply topically as directed.    . digoxin (LANOXIN) 0.125 MG tablet TAKE 1/2 TABLET BY MOUTH ONCE DAILY 45 tablet 0  . furosemide (LASIX) 20 MG tablet Take 20 mg by mouth daily as needed for fluid or edema.     Marland Kitchen glipiZIDE (GLUCOTROL XL) 10 MG 24 hr tablet Take 20 mg by mouth daily with breakfast.     . hydrALAZINE (APRESOLINE) 50 MG tablet Take 1 tablet (50 mg total) by mouth 3 (three) times daily. 90 tablet 6  . Insulin Glargine (BASAGLAR KWIKPEN) 100 UNIT/ML Inject 5-20 Units into the skin at bedtime as needed (blood sugar 150 or above).    . isosorbide dinitrate (ISORDIL) 20 MG tablet TAKE 1 TABLET (20 MG TOTAL) BY  MOUTH 3 TIMES DAILY. 90 tablet 11  . JARDIANCE 25 MG TABS tablet Take 25 mg by mouth daily.    Marland Kitchen losartan (COZAAR) 50 MG tablet Take 50 mg by mouth 2 (two) times daily.    . metFORMIN (GLUCOPHAGE) 1000 MG tablet Take 1,000 mg by mouth 2 (two) times daily with a meal.    . Multiple Vitamin (MULTIVITAMIN WITH MINERALS) TABS tablet Take 1 tablet by mouth daily. Centrum Silver    . ondansetron (ZOFRAN) 8 MG tablet Take by mouth every 8 (eight) hours as needed for nausea or vomiting.    Marland Kitchen OVER THE COUNTER MEDICATION Apply 1 application topically at bedtime. Baby dont be bald otc cream    . OZEMPIC, 1 MG/DOSE, 4 MG/3ML SOPN Inject 1 mg into the skin every Thursday.    . simvastatin (ZOCOR) 20 MG tablet Take 20 mg by mouth at bedtime.     Marland Kitchen spironolactone (ALDACTONE) 25 MG tablet TAKE 1 TABLET BY MOUTH EVERY EVENING. 90 tablet 3  . TRUE METRIX BLOOD GLUCOSE TEST test strip 1 each by Other route as directed.   5  . TRUEPLUS LANCETS 30G MISC 1 each by Other route as directed. Use as directed.  5  . UNIFINE PENTIPS 32G X 4 MM MISC 1 each by Other route as directed.     . Wound Cleansers (RADIAPLEX EX) Apply 1 application topically at bedtime.     No current facility-administered medications for this encounter.   BP 104/60   Pulse 66   Wt 68.3 kg (150 lb 9.6 oz)   SpO2 98%   BMI 25.06 kg/m  General: NAD Neck: No JVD, no thyromegaly or thyroid nodule.  Lungs: Clear to auscultation bilaterally with normal respiratory effort. CV: Nondisplaced PMI.  Heart regular S1/S2, no S3/S4, no murmur.  No peripheral edema.  No carotid bruit.  Normal pedal pulses.  Abdomen: Soft, nontender, no hepatosplenomegaly, no distention.  Skin:  Intact without lesions or rashes.  Neurologic: Alert and oriented x 3.  Psych: Normal affect. Extremities: No clubbing or cyanosis.  HEENT: Normal.   Assessment/Plan: 1. Chronic systolic CHF: Nonischemic cardiomyopathy by 12/19 cath.  Cardiac MRI with LV EF 14%, RV EF 17%. No  definite evidence for myocarditis or infiltrative disease by delayed enhancement images.  Most likely cause of cardiomyopathy is familial versus prior viral myocarditis.  Brother also had a cardiomyopathy of uncertain etiology.  Genetic testing was done, showing an LMNA gene variant of uncertain significance => she saw Dr. Broadus John, suspect benign/uninvolved variant (no conduction abnormality).  CPX in 2/20 showed only mild HF limitation.  Echo in 6/20 showed EF 25-30%. Echo in 10/21 showed EF up some to 40%.  She now has a [place].  She was started on Taxol/Herceptin chemotherapy for breast cancer and 11/21 echo showed fall in EF to 30%.  It was decided that she would not be a candidate for ongoing Herceptin and this medication was stopped.  Echo in 1/22 showed EF 25-30%, diffuse hypokinesis, mildly decreased RV systolic function.  CPX in 3/22 showed mild HF limitation.  On exam and by Heartlogic, she is not volume overloaded.  NYHA class I-II symptoms.  Narrow QRS, not CRT candidate. She does not have BP room for medication titration.  - Continue Coreg 25 mg bid.   - Continue losartan 50 mg bid.  She cannot take Entresto with history of ACEI angioedema.   - Continue Jardiance.   - She will continue to take Lasix only prn.   - Continue spironolactone 25 mg daily. BMET today.  - Continue digoxin, check level.  - Continue hydralazine 50 mg tid and isordil 20 mg tid.  - I do not think she is symptomatic enough at this time for barostimulator activation therapy.  2. Type II diabetes: She is on Jardiance.  3. Breast cancer: HER2+.  Ideally, would have treatment with Taxol/Herceptin followed by Herceptin alone to complete a year.  She started Herceptin and Taxol chemotherapy.  EF fell to 30% and Herceptin was stopped.  I do not think Herceptin will be a good option for her going forwards.    Followup in 4 months.    Loralie Champagne 02/21/2021

## 2021-02-21 NOTE — Patient Instructions (Signed)
Labs done today, your results will be available in MyChart, we will contact you for abnormal readings.  Your physician recommends that you schedule a follow-up appointment in: 4 months  If you have any questions or concerns before your next appointment please send us a message through mychart or call our office at [phone].    TO LEAVE A MESSAGE FOR THE NURSE SELECT OPTION 2, PLEASE LEAVE A MESSAGE INCLUDING: . YOUR NAME . DATE OF BIRTH . CALL BACK NUMBER . REASON FOR CALL**this is important as we prioritize the call backs  YOU WILL RECEIVE A CALL BACK THE SAME DAY AS LONG AS YOU CALL BEFORE 4:00 PM  At the [facility], you and your health needs are our priority. As part of our continuing mission to provide you with exceptional heart care, we have created designated Provider Care Teams. These Care Teams include your primary Cardiologist (physician) and Advanced Practice Providers (APPs- Physician Assistants and Nurse Practitioners) who all work together to provide you with the care you need, when you need it.   You may see any of the following providers on your designated Care Team at your next follow up: . Dr Daniel Bensimhon . Dr Dalton McLean . Dr Brandon Winfrey . Amy Clegg, NP . Brittainy Simmons, PA . Jessica Milford,NP . Lauren Kemp, PharmD   Please be sure to bring in all your medications bottles to every appointment.     

## 2021-02-22 ENCOUNTER — Other Ambulatory Visit (HOSPITAL_COMMUNITY): Payer: Self-pay | Admitting: Cardiology

## 2021-02-22 MED ORDER — LOSARTAN POTASSIUM 50 MG PO TABS: 50.0000 mg | ORAL_TABLET | Freq: Two times a day (BID) | ORAL | 11 refills | Status: DC

## 2021-02-22 NOTE — Telephone Encounter (Signed)
Pt left voicemail with triage Reports losartan medication was not sent to pharmacy Med updated and returned to pharmacy Detailed message left for patient

## 2021-03-03 ENCOUNTER — Telehealth: Payer: Self-pay | Admitting: Medical Oncology

## 2021-03-03 NOTE — Telephone Encounter (Signed)
R7116, A PROSPECTIVE OBSERVATIONAL COHORT STUDY TO DEVELOP A PREDICTIVE MODEL OF TAXANE-INDUCED PERIPHERAL NEUROPATHY IN CANCER PATIENTS.  Outgoing call: appointment Spoke with patient regarding scheduling her week 24 study assessment. Inquired for an available day for her to complete this visit. We made plans for patient to complete week 24 on April 21st @ 10:00 AM. Patient informed of what study assessments collected, including questionnaires. Patient was thanked for her time and encouraged to call should she need to changed appointment time. Patient denies questions at this time.  Maxwell Marion, RN, BSN, [facility] Clinical Research 03/03/2021 1:30 PM

## 2021-03-09 ENCOUNTER — Inpatient Hospital Stay: Payer: HMO | Attending: Hematology and Oncology | Admitting: Medical Oncology

## 2021-03-09 ENCOUNTER — Encounter: Payer: Self-pay | Admitting: Medical Oncology

## 2021-03-09 ENCOUNTER — Other Ambulatory Visit: Payer: Self-pay

## 2021-03-09 DIAGNOSIS — C50511 Malignant neoplasm of lower-outer quadrant of right female breast: Secondary | ICD-10-CM

## 2021-03-09 NOTE — Progress Notes (Signed)
[id number], A PROSPECTIVE OBSERVATIONAL COHORT STUDY TO DEVELOP A PREDICTIVE MODEL OF TAXANE-INDUCED PERIPHERAL NEUROPATHY IN CANCER PATIENTS.  24 Weeks Assessment  Patient in clinic today for her 24 week assessment on study. Patient arrived by herself and was provided with the study questionnaires to complete. I reviewed with patient any neuropathy symptoms she may be having. Patient states that she has minimal and occasional numbness that she experiences in bilateral fingers and toes, but not enough that she remembers when and how often it occurs.Patient states that she is doing well and confirms that the numbness/tingling does not interfere in anyway with her daily life/activitites. Patient has completed all chemotherapy and radiation treatments at this point in time.    PROs: Patient was provided with questionnaires to complete and these were collected and checked for completeness.  Labs: Patient did not consent to the optional whole blood collection.  Physician Assessments: CTCAE and Treatment Burden forms reviewed and signed by Dr. Lindi Adie. History of Falls: Completed. Patient reports no falls. Timed Get Up and Go: Completed and timed at 8.7 seconds.  Assessment for Interventions for CIPN: Reviewed with patient and CRFs completed. Supplements, Topical Agents and other treatments: Reviewed with patient.  Neuropen Assessment: Completed per protocol by this certified research RN, recording of time was done by research nurse, Ruben Im.  [place] Assessment: Completed per protocol by this certified Electrical engineer, recording of time was done by research nurse, [place]: Patient informed of next study assessments to be at 52 weeks from the start of study, and toward the end of this year, and that we will contact her to schedule this visit that best works for her schedule. Patient denied having any questions at this time. Patient thanked for her time and continued support of study and was encouraged  to call clinic or myself with any questions or concerns she may have. Maxwell Marion, RN, BSN, [facility] Clinical Research 03/09/2021 11:04 AM

## 2021-03-15 DIAGNOSIS — E785 Hyperlipidemia, unspecified: Secondary | ICD-10-CM | POA: Diagnosis not present

## 2021-03-15 DIAGNOSIS — I509 Heart failure, unspecified: Secondary | ICD-10-CM | POA: Diagnosis not present

## 2021-03-15 DIAGNOSIS — I10 Essential (primary) hypertension: Secondary | ICD-10-CM | POA: Diagnosis not present

## 2021-03-15 DIAGNOSIS — E1169 Type 2 diabetes mellitus with other specified complication: Secondary | ICD-10-CM | POA: Diagnosis not present

## 2021-03-15 DIAGNOSIS — C50511 Malignant neoplasm of lower-outer quadrant of right female breast: Secondary | ICD-10-CM | POA: Diagnosis not present

## 2021-03-17 ENCOUNTER — Other Ambulatory Visit (HOSPITAL_COMMUNITY): Payer: Self-pay | Admitting: Cardiology

## 2021-03-22 ENCOUNTER — Ambulatory Visit: Payer: HMO | Attending: Internal Medicine

## 2021-03-22 ENCOUNTER — Other Ambulatory Visit (HOSPITAL_BASED_OUTPATIENT_CLINIC_OR_DEPARTMENT_OTHER): Payer: Self-pay

## 2021-03-22 ENCOUNTER — Other Ambulatory Visit: Payer: Self-pay

## 2021-03-22 DIAGNOSIS — Z23 Encounter for immunization: Secondary | ICD-10-CM

## 2021-03-22 MED ORDER — PFIZER-BIONT COVID-19 VAC-TRIS 30 MCG/0.3ML IM SUSP
INTRAMUSCULAR | 0 refills | Status: DC
  Filled 2021-03-22: qty 0.3, 1d supply, fill #0

## 2021-03-22 NOTE — Progress Notes (Signed)
   Covid-19 Vaccination Clinic  Name:  Coletta Lockner    MRN: [id number] DOB: 1956/01/05  03/22/2021  Ms. Connolly was observed post Covid-19 immunization for 15 minutes without incident. She was provided with Vaccine Information Sheet and instruction to access the V-Safe system.   Ms. Mcghee was instructed to call 911 with any severe reactions post vaccine: Marland Kitchen Difficulty breathing  . Swelling of face and throat  . A fast heartbeat  . A bad rash all over body  . Dizziness and weakness   Immunizations Administered    Name Date Dose VIS Date Route   PFIZER Comrnaty(Gray TOP) Covid-19 Vaccine 03/22/2021  2:18 PM 0.3 mL 10/27/2020 Intramuscular   Manufacturer: [place]   Lot: [id number]   NDC: [phone]

## 2021-03-27 ENCOUNTER — Ambulatory Visit
Admission: RE | Admit: 2021-03-27 | Discharge: 2021-03-27 | Disposition: A | Discharge status: Home / Self Care | Payer: HMO | Source: Ambulatory Visit | Attending: Radiation Oncology | Admitting: Radiation Oncology

## 2021-03-27 DIAGNOSIS — Z170 Estrogen receptor positive status [ER+]: Secondary | ICD-10-CM | POA: Insufficient documentation

## 2021-03-27 DIAGNOSIS — C50511 Malignant neoplasm of lower-outer quadrant of right female breast: Secondary | ICD-10-CM

## 2021-03-27 NOTE — Progress Notes (Signed)
  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  Interval Since Last Radiation: 11 weeks   12/15/2020 through 01/11/2021 Site Technique Total Dose (Gy) Dose per Fx (Gy) Completed Fx Beam Energies  Breast, Right: Breast_Rt 3D 42.56/42.56 2.66 16/16 6X  Breast, Right: Breast_Rt_Bst specialPort 8/8 2 4/4 9E, 12E     Narrative:  The patient was contacted today for routine follow-up. During treatment she did very well with radiotherapy and did not have significant desquamation. She reports she is doing well and her skin is healed without residual concerns.  Impression/Plan: 1. 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 The patient has been doing well since completion of radiotherapy. We discussed that we would be happy to continue to follow her as needed, but she will also continue to follow up with Dr. Lindi Adie in medical oncology. She was counseled on skin care as well as measures to avoid sun exposure to this area.  2. Survivorship. We discussed the importance of survivorship evaluation and encouraged her to attend her upcoming visit with that clinic.       Carola Rhine, PAC

## 2021-03-29 ENCOUNTER — Ambulatory Visit (INDEPENDENT_AMBULATORY_CARE_PROVIDER_SITE_OTHER): Payer: HMO

## 2021-03-29 DIAGNOSIS — I428 Other cardiomyopathies: Secondary | ICD-10-CM | POA: Diagnosis not present

## 2021-03-29 LAB — CUP PACEART REMOTE DEVICE CHECK
Battery Remaining Longevity: 162 mo
Battery Remaining Percentage: 100 %
Brady Statistic RV Percent Paced: 0 %
Date Time Interrogation Session: 20220511050100
HighPow Impedance: 62 Ohm
Implantable Lead Implant Date: 20200730
Implantable Lead Location: 753860
Implantable Lead Model: 292
Implantable Lead Serial Number: 447014
Implantable Pulse Generator Implant Date: 20200730
Lead Channel Impedance Value: 418 Ohm
Lead Channel Pacing Threshold Amplitude: 1.8 V
Lead Channel Pacing Threshold Pulse Width: 0.4 ms
Lead Channel Setting Pacing Amplitude: 3.5 V
Lead Channel Setting Pacing Pulse Width: 0.4 ms
Lead Channel Setting Sensing Sensitivity: 0.5 mV
Pulse Gen Serial Number: 266091

## 2021-04-13 ENCOUNTER — Other Ambulatory Visit: Payer: Self-pay

## 2021-04-13 ENCOUNTER — Inpatient Hospital Stay: Payer: HMO | Attending: Hematology and Oncology | Admitting: Adult Health

## 2021-04-13 ENCOUNTER — Encounter: Payer: Self-pay | Admitting: Adult Health

## 2021-04-13 VITALS — BP 107/64 | HR 89 | Temp 97.3°F | Resp 18 | Ht 65.0 in | Wt 150.7 lb

## 2021-04-13 DIAGNOSIS — Z95810 Presence of automatic (implantable) cardiac defibrillator: Secondary | ICD-10-CM | POA: Diagnosis not present

## 2021-04-13 DIAGNOSIS — Z79811 Long term (current) use of aromatase inhibitors: Secondary | ICD-10-CM | POA: Diagnosis not present

## 2021-04-13 DIAGNOSIS — I10 Essential (primary) hypertension: Secondary | ICD-10-CM | POA: Diagnosis not present

## 2021-04-13 DIAGNOSIS — E785 Hyperlipidemia, unspecified: Secondary | ICD-10-CM | POA: Diagnosis not present

## 2021-04-13 DIAGNOSIS — I110 Hypertensive heart disease with heart failure: Secondary | ICD-10-CM | POA: Insufficient documentation

## 2021-04-13 DIAGNOSIS — Z923 Personal history of irradiation: Secondary | ICD-10-CM | POA: Diagnosis not present

## 2021-04-13 DIAGNOSIS — Z794 Long term (current) use of insulin: Secondary | ICD-10-CM | POA: Insufficient documentation

## 2021-04-13 DIAGNOSIS — Z9221 Personal history of antineoplastic chemotherapy: Secondary | ICD-10-CM | POA: Insufficient documentation

## 2021-04-13 DIAGNOSIS — Z7982 Long term (current) use of aspirin: Secondary | ICD-10-CM | POA: Insufficient documentation

## 2021-04-13 DIAGNOSIS — C50511 Malignant neoplasm of lower-outer quadrant of right female breast: Secondary | ICD-10-CM

## 2021-04-13 DIAGNOSIS — I509 Heart failure, unspecified: Secondary | ICD-10-CM | POA: Insufficient documentation

## 2021-04-13 DIAGNOSIS — E2839 Other primary ovarian failure: Secondary | ICD-10-CM | POA: Diagnosis not present

## 2021-04-13 DIAGNOSIS — Z170 Estrogen receptor positive status [ER+]: Secondary | ICD-10-CM | POA: Diagnosis not present

## 2021-04-13 DIAGNOSIS — E1169 Type 2 diabetes mellitus with other specified complication: Secondary | ICD-10-CM | POA: Diagnosis not present

## 2021-04-13 DIAGNOSIS — Z79899 Other long term (current) drug therapy: Secondary | ICD-10-CM | POA: Diagnosis not present

## 2021-04-13 DIAGNOSIS — E1140 Type 2 diabetes mellitus with diabetic neuropathy, unspecified: Secondary | ICD-10-CM | POA: Insufficient documentation

## 2021-04-13 NOTE — Progress Notes (Signed)
SURVIVORSHIP VISIT:  BRIEF ONCOLOGIC HISTORY:  Oncology History  Malignant neoplasm of lower-outer quadrant of right breast of female, estrogen receptor positive ([place])  07/01/2020 Initial Diagnosis   Screening mammogram showed a right breast mass. Mammogram and US showed a 0.7cm mass at the 6 o'clock position in the right breast, no axillary adenopathy. 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    07/06/2020 Cancer Staging   Staging form: Breast, AJCC 8th Edition - Clinical stage from 07/06/2020: Stage IB (cT1b, cN0, cM0, G3, ER+, PR-, HER2-)   07/26/2020 Genetic Testing   Negative genetic testing:  No pathogenic variants detected on the [facility] Common Hereditary Cancers Panel. The report date is 07/26/2020.   The Common Hereditary Cancers Panel offered by [facility] includes sequencing and/or deletion duplication testing of the following 48 genes: APC, ATM, AXIN2, BARD1, BMPR1A, BRCA1, BRCA2, BRIP1, CDH1, CDK4, CDKN2A (p14ARF), CDKN2A (p16INK4a), CHEK2, CTNNA1, DICER1, EPCAM (Deletion/duplication testing only), GREM1 (promoter region deletion/duplication testing only), KIT, MEN1, MLH1, MSH2, MSH3, MSH6, MUTYH, NBN, NF1, NTHL1, PALB2, PDGFRA, PMS2, POLD1, POLE, PTEN, RAD50, RAD51C, RAD51D, RNF43, SDHB, SDHC, SDHD, SMAD4, SMARCA4. STK11, TP53, TSC1, TSC2, and VHL.  The following genes were evaluated for sequence changes only: SDHA and HOXB13 c.251G>A variant only.   08/11/2020 Surgery   Right lumpectomy [name]) [phone]): IDC, grade 3, 0.8cm, clear margins, 3 right axillary lymph nodes negative for carcinoma. ER 30% weak, PR 0%, HER-2 positive, Ki-67 40%   08/18/2020 Cancer Staging   Staging form: Breast, AJCC 8th Edition - Pathologic stage from 08/18/2020: Stage IA (pT1b, pN0, cM0, G3, ER+, PR-, HER2+)   09/08/2020 -  Adjuvant Chemotherapy   Weekly Taxol/herceptin x 12 (09/08/2020 - 11/24/2020). Herceptin was  stopped after first 5 weekly cycles (last received on 10/06/2020) due to decline in EF from 40% to 30%.     12/15/2020 - 01/11/2021 Radiation Therapy   The patient initially received a dose of 42.56 Gy in 16 fractions to the breast using whole-breast tangent fields. This was delivered using a 3-D conformal technique. The pt received a boost delivering an additional 8 Gy in 4 fractions using a electron boost with 36mV electrons. The total dose was 50.56 Gy.   12/2020 -  Anti-estrogen oral therapy   Anastrozole     INTERVAL HISTORY:  Ms. AVargoto review her survivorship care plan detailing her treatment course for breast cancer, as well as monitoring long-term side effects of that treatment, education regarding health maintenance, screening, and overall wellness and health promotion.     Overall, Ms. AGiftreports feeling quite well.  She notes that she has some residual neuropathy, and some stress from her cancer treatments.  She denies any other new physical issues, and overall is tolerating her Anastrozole well.  She has mild manageable hot flashes.  Otherwise a  Detailed ROS was non contributory.   REVIEW OF SYSTEMS:  Review of Systems  Constitutional: Negative for appetite change, chills, fatigue, fever and unexpected weight change.  HENT:   Negative for hearing loss, lump/mass and trouble swallowing.   Eyes: Negative for eye problems and icterus.  Respiratory: Negative for chest tightness, cough and shortness of breath.   Cardiovascular: Negative for chest pain, leg swelling and palpitations.  Gastrointestinal: Negative for abdominal distention, abdominal pain, constipation, diarrhea, nausea and vomiting.  Endocrine: Negative for hot flashes.  Genitourinary: Negative for difficulty urinating.   Musculoskeletal: Negative for arthralgias.  Skin: Negative  for itching and rash.  Neurological: Negative for dizziness, extremity weakness, headaches and numbness.  Hematological: Negative for  adenopathy. Does not bruise/bleed easily.  Psychiatric/Behavioral: Negative for depression. The patient is not nervous/anxious.    Breast: Denies any new nodularity, masses, tenderness, nipple changes, or nipple discharge.      ONCOLOGY TREATMENT TEAM:  1. Surgeon:  Dr. [name] at [facility] 2. Medical Oncologist: Dr. Lindi Adie  3. Radiation Oncologist: Dr. Lisbeth Renshaw    PAST MEDICAL/SURGICAL HISTORY:  Past Medical History:  Diagnosis Date  . AICD (automatic cardioverter/defibrillator) present   . Cancer [facility])    Right sided breast cancer  . CHF (congestive heart failure) ([place])   . Diabetes mellitus without complication ([place])    type 2   . Family history of breast cancer   . Family history of prostate cancer   . Family history of thyroid cancer   . Hypertension    Past Surgical History:  Procedure Laterality Date  . ABDOMINAL HYSTERECTOMY    . BREAST LUMPECTOMY WITH RADIOACTIVE SEED AND SENTINEL LYMPH NODE BIOPSY Right 08/11/2020   Procedure: RIGHT BREAST LUMPECTOMY WITH RADIOACTIVE SEED AND SENTINEL LYMPH NODE BIOPSY;  Surgeon: Coralie Keens, MD;  Location: [place];  Service: General;  Laterality: Right;  . CARDIAC CATHETERIZATION     in [facility], clean, per pt.  . ICD IMPLANT N/A 06/18/2019   Procedure: ICD IMPLANT;  Surgeon: Evans Lance, MD;  Location: [place] [facility];  Service: Cardiovascular;  Laterality: N/A;  . PORT-A-CATH REMOVAL N/A 01/31/2021   Procedure: REMOVAL PORT-A-CATH;  Surgeon: Coralie Keens, MD;  Location: [facility];  Service: General;  Laterality: N/A;  . PORTACATH PLACEMENT N/A 08/31/2020   Procedure: INSERTION PORT-A-CATH WITH ULTRASOUND GUIDANCE;  Surgeon: Coralie Keens, MD;  Location: [place];  Service: General;  Laterality: N/A;  . RIGHT/LEFT HEART CATH AND CORONARY ANGIOGRAPHY N/A 11/17/2018   Procedure: RIGHT/LEFT HEART CATH AND CORONARY ANGIOGRAPHY;  Surgeon: Belva Crome, MD;  Location: [place] [facility];  Service:  Cardiovascular;  Laterality: N/A;     ALLERGIES:  Allergies  Allergen Reactions  . Biaxin [Clarithromycin] Nausea Only  . Vasotec [Enalapril] Other (See Comments)    Lip swelling / angioedema Tolerates ARB     CURRENT MEDICATIONS:  Outpatient Encounter Medications as of 04/13/2021  Medication Sig  . acetaminophen (TYLENOL) 500 MG tablet Take 1,000 mg by mouth every 6 (six) hours as needed for moderate pain or headache.  . anastrozole (ARIMIDEX) 1 MG tablet Take 1 tablet (1 mg total) by mouth daily.  Marland Kitchen aspirin 81 MG chewable tablet Chew 1 tablet (81 mg total) by mouth daily.  . carvedilol (COREG) 25 MG tablet TAKE 1 TABLET (25 MG TOTAL) BY MOUTH 2 (TWO) TIMES DAILY WITH A MEAL.  . cetirizine (ZYRTEC) 10 MG tablet Take 10 mg by mouth at bedtime.  . Continuous Blood Gluc Receiver (FREESTYLE LIBRE 14 DAY READER) DEVI Apply topically as directed.  . Continuous Blood Gluc Sensor (FREESTYLE LIBRE 14 DAY SENSOR) MISC Apply topically as directed.  Marland Kitchen COVID-19 mRNA Vac-TriS, Pfizer, (PFIZER-BIONT COVID-19 VAC-TRIS) SUSP injection Inject into the muscle.  . digoxin (LANOXIN) 0.125 MG tablet TAKE 1/2 TABLET BY MOUTH DAILY  . furosemide (LASIX) 20 MG tablet TAKE ONE TABLET BY MOUTH ONCE DAILY  . glipiZIDE (GLUCOTROL XL) 10 MG 24 hr tablet Take 20 mg by mouth daily with breakfast.   . hydrALAZINE (APRESOLINE) 50 MG tablet Take 1 tablet (50 mg total) by mouth 3 (  three) times daily.  . Insulin Glargine (BASAGLAR KWIKPEN) 100 UNIT/ML Inject 5-20 Units into the skin at bedtime as needed (blood sugar 150 or above).  . isosorbide dinitrate (ISORDIL) 20 MG tablet TAKE 1 TABLET (20 MG TOTAL) BY MOUTH 3 TIMES DAILY.  Marland Kitchen JARDIANCE 25 MG TABS tablet Take 25 mg by mouth daily.  Marland Kitchen losartan (COZAAR) 50 MG tablet Take 1 tablet (50 mg total) by mouth 2 (two) times daily.  . metFORMIN (GLUCOPHAGE) 1000 MG tablet Take 1,000 mg by mouth 2 (two) times daily with a meal.  . Multiple Vitamin (MULTIVITAMIN WITH MINERALS)  TABS tablet Take 1 tablet by mouth daily. Centrum Silver  . ondansetron (ZOFRAN) 8 MG tablet Take by mouth every 8 (eight) hours as needed for nausea or vomiting.  Marland Kitchen OVER THE COUNTER MEDICATION Apply 1 application topically at bedtime. Baby dont be bald otc cream  . OZEMPIC, 1 MG/DOSE, 4 MG/3ML SOPN Inject 1 mg into the skin every Thursday.  . simvastatin (ZOCOR) 20 MG tablet Take 20 mg by mouth at bedtime.   Marland Kitchen spironolactone (ALDACTONE) 25 MG tablet TAKE 1 TABLET BY MOUTH EVERY EVENING.  . TRUE METRIX BLOOD GLUCOSE TEST test strip 1 each by Other route as directed.   . TRUEPLUS LANCETS 30G MISC 1 each by Other route as directed. Use as directed.  Marland Kitchen UNIFINE PENTIPS 32G X 4 MM MISC 1 each by Other route as directed.   . Wound Cleansers (RADIAPLEX EX) Apply 1 application topically at bedtime.   No facility-administered encounter medications on file as of 04/13/2021.     ONCOLOGIC FAMILY HISTORY:  Family History  Problem Relation Age of Onset  . Diabetes Mellitus I Mother   . Lung cancer Mother 71  . Breast cancer Mother        dx. late 30s/early 36s  . Diabetes Mellitus I Father   . Sudden death Father 28  . Prostate cancer Brother 55  . Hypertension Brother   . Hypertension Brother   . Diabetes Mellitus I Brother   . Benign prostatic hyperplasia Brother   . Heart failure Paternal Uncle   . Thyroid cancer Niece        dx. in her 65s  . Breast cancer Cousin        dx. in her 49s, recurrence in her 110s (maternal first cousin)  . Breast cancer Cousin        female dx. in his early 42s (paternal first cousin)  . Cancer Cousin        dx. in his early 76s, unknown type (paternal first cousin)  . Cancer Cousin 77       unknown type (paternal first cousin)     GENETIC COUNSELING/TESTING: Not at this time  SOCIAL HISTORY:  Social History   Socioeconomic History  . Marital status: Single    Spouse name: Not on file  . Number of children: Not on file  . Years of education: 35  .  Highest education level: Bachelor's degree (e.g., BA, AB, BS)  Occupational History  . Occupation: Surveyor, quantity: [employer]  Tobacco Use  . Smoking status: Never Smoker  . Smokeless tobacco: Never Used  Vaping Use  . Vaping Use: Never used  Substance and Sexual Activity  . Alcohol use: Yes    Alcohol/week: 0.0 standard drinks    Comment: less than once a month  . Drug use: No  . Sexual activity: Never  Other Topics Concern  .  Not on file  Social History Narrative   Works at [employer].  Lives alone.     Social Determinants of Health   Financial Resource Strain: Low Risk   . Difficulty of Paying Living Expenses: Not hard at all  Food Insecurity: No Food Insecurity  . Worried About Charity fundraiser in the Last Year: Never true  . Ran Out of Food in the Last Year: Never true  Transportation Needs: No Transportation Needs  . Lack of Transportation (Medical): No  . Lack of Transportation (Non-Medical): No  Physical Activity: Not on file  Stress: Not on file  Social Connections: Not on file  Intimate Partner Violence: Not At Risk  . Fear of Current or Ex-Partner: No  . Emotionally Abused: No  . Physically Abused: No  . Sexually Abused: No     OBSERVATIONS/OBJECTIVE:  BP 107/64 (BP Location: Left Arm, Patient Position: Sitting)   Pulse 89   Temp (!) 97.3 F (36.3 C) (Tympanic)   Resp 18   Ht 5' 5"  (1.651 m)   Wt 150 lb 11.2 oz (68.4 kg)   SpO2 99%   BMI 25.08 kg/m  GENERAL: Patient is a well appearing female in no acute distress HEENT:  Sclerae anicteric.  Mask in place Neck is supple.  NODES:  No cervical, supraclavicular, or axillary lymphadenopathy palpated.  BREAST EXAM:  Right breast s/p lumpectomy and radiation, no sign of local recurrence, left breast benign LUNGS:  Clear to auscultation bilaterally.  No wheezes or rhonchi. HEART:  Regular rate and rhythm. No murmur appreciated. ABDOMEN:  Soft, nontender.  Positive, normoactive bowel sounds.  No organomegaly palpated. MSK:  No focal spinal tenderness to palpation. Full range of motion bilaterally in the upper extremities. EXTREMITIES:  No peripheral edema.   SKIN:  Clear with no obvious rashes or skin changes. No nail dyscrasia. NEURO:  Nonfocal. Well oriented.  Appropriate affect.    LABORATORY DATA:  None for this visit.  DIAGNOSTIC IMAGING:  None for this visit.      ASSESSMENT AND PLAN:  Ms.. Folds is a pleasant 65 y.o. female with Stage IB right breast invasive ductal carcinoma, ER+/PR-/HER2+, diagnosed in 06/2020, treated with lumpectomy, adjuvant chemotherapy, maintenance Herceptin, adjuvant radiation therapy, and anti-estrogen therapy with Anastrozole beginning in 12/2020.  She presents to the Survivorship Clinic for our initial meeting and routine follow-up post-completion of treatment for breast cancer.    1. Stage IB right breast cancer:  Ms. Ainley is continuing to recover from definitive treatment for breast cancer. She will follow-up with her medical oncologist, Dr. Lindi Adie in 6 months with history and physical exam per surveillance protocol.  She will continue her anti-estrogen therapy with Anastrozole. Thus far, she is tolerating the Anastrozole well, with minimal side effects. She was instructed to make Dr. Lindi Adie or myself aware if she begins to experience any worsening side effects of the medication and I could see her back in clinic to help manage those side effects, as needed. Her mammogram is due 05/2021; orders placed today. Today, a comprehensive survivorship care plan and treatment summary was reviewed with the patient today detailing her breast cancer diagnosis, treatment course, potential late/long-term effects of treatment, appropriate follow-up care with recommendations for the future, and patient education resources.  A copy of this summary, along with a letter will be sent to the patient's primary care provider via mail/fax/In Basket message after today's  visit.    2. Bone health:  Given Ms. Krach's age/history of breast  cancer and her current treatment regimen including anti-estrogen therapy with Anastrozole, she is at risk for bone demineralization.  I placed orders for her to undergo bone density testing when available.  She was given education on specific activities to promote bone health.  3. Cancer screening:  Due to Ms. Bohr's history and her age, she should receive screening for skin cancers, colon cancer, and gynecologic cancers.  The information and recommendations are listed on the patient's comprehensive care plan/treatment summary and were reviewed in detail with the patient.    4. Health maintenance and wellness promotion: Ms. Godette was encouraged to consume 5-7 servings of fruits and vegetables per day. We reviewed the "Nutrition Rainbow" handout, as well as the handout "Take Control of Your Health and Reduce Your Cancer Risk" from the [place].  She was also encouraged to engage in moderate to vigorous exercise for 30 minutes per day most days of the week. We discussed the [employer] fitness program, which is designed for cancer survivors to help them become more physically fit after cancer treatments.  She was instructed to limit her alcohol consumption and continue to abstain from tobacco use.     5. Support services/counseling: It is not uncommon for this period of the patient's cancer care trajectory to be one of many emotions and stressors.  We discussed how this can be increasingly difficult during the times of quarantine and social distancing due to the COVID-19 pandemic.   She was given information regarding our available services and encouraged to contact me with any questions or for help enrolling in any of our support group/programs.    Follow up instructions:    -Return to cancer center in 6 months for f/u with Dr. Lindi Adie  -Mammogram due in 05/2021 -Follow up with surgery 03/2022 -She is welcome to return  back to the Survivorship Clinic at any time; no additional follow-up needed at this time.  -Consider referral back to survivorship as a long-term survivor for continued surveillance  The patient was provided an opportunity to ask questions and all were answered. The patient agreed with the plan and demonstrated an understanding of the instructions.   Total encounter time: 30 minutes* in face to face visit time, SCP preparation, order entry, collaboration with medical oncology team, and documentation of the encounter.  Wilber Bihari, NP 04/13/21 12:08 PM Medical Oncology and Hematology [facility] [place], [place] [address] Tel. [phone]    Fax. [phone]  *Total Encounter Time as defined by the Centers for Medicare and Medicaid Services includes, in addition to the face-to-face time of a patient visit (documented in the note above) non-face-to-face time: obtaining and reviewing outside history, ordering and reviewing medications, tests or procedures, care coordination (communications with other health care professionals or caregivers) and documentation in the medical record.

## 2021-04-14 ENCOUNTER — Telehealth: Payer: Self-pay | Admitting: Adult Health

## 2021-04-14 NOTE — Telephone Encounter (Signed)
Calling patient about neuropathy clinical trial.  I let her know that I sent the information through my chart for her to review and if interested she can either reply to the my chart message or call me to let me know.    Wilber Bihari, NP

## 2021-04-20 ENCOUNTER — Encounter: Payer: Self-pay | Admitting: Licensed Clinical Social Worker

## 2021-04-20 NOTE — Progress Notes (Signed)
Remote ICD transmission.   

## 2021-04-20 NOTE — Progress Notes (Signed)
[facility] Quality of Life Screening Clinical Social Work  Clinical Social Work was referred by survivorship quality of life screening protocol.  The patient scored a 39.6 on the Quality of Life/ Cancer Survivor (QOL-CS) scale which indicates moderate quality of life.  Based on screener, fears of recurrence, 2nd cancer, metastasis, and future tests are primary concerns.  Clinical Social Worker attempted to contact patient by phone to assess for needs.  No answer. Left VM with direct contact information.    Follow up plan: 1. CSW will attempt to contact patient again at later date    [place], LCSW

## 2021-05-04 DIAGNOSIS — E1169 Type 2 diabetes mellitus with other specified complication: Secondary | ICD-10-CM | POA: Diagnosis not present

## 2021-05-04 DIAGNOSIS — C50511 Malignant neoplasm of lower-outer quadrant of right female breast: Secondary | ICD-10-CM | POA: Diagnosis not present

## 2021-05-04 DIAGNOSIS — E785 Hyperlipidemia, unspecified: Secondary | ICD-10-CM | POA: Diagnosis not present

## 2021-05-04 DIAGNOSIS — I509 Heart failure, unspecified: Secondary | ICD-10-CM | POA: Diagnosis not present

## 2021-05-04 DIAGNOSIS — I10 Essential (primary) hypertension: Secondary | ICD-10-CM | POA: Diagnosis not present

## 2021-05-15 DIAGNOSIS — M79672 Pain in left foot: Secondary | ICD-10-CM | POA: Diagnosis not present

## 2021-05-30 DIAGNOSIS — I509 Heart failure, unspecified: Secondary | ICD-10-CM | POA: Diagnosis not present

## 2021-05-30 DIAGNOSIS — Z0000 Encounter for general adult medical examination without abnormal findings: Secondary | ICD-10-CM | POA: Diagnosis not present

## 2021-05-30 DIAGNOSIS — E785 Hyperlipidemia, unspecified: Secondary | ICD-10-CM | POA: Diagnosis not present

## 2021-05-30 DIAGNOSIS — C50511 Malignant neoplasm of lower-outer quadrant of right female breast: Secondary | ICD-10-CM | POA: Diagnosis not present

## 2021-05-30 DIAGNOSIS — I10 Essential (primary) hypertension: Secondary | ICD-10-CM | POA: Diagnosis not present

## 2021-05-30 DIAGNOSIS — Z7984 Long term (current) use of oral hypoglycemic drugs: Secondary | ICD-10-CM | POA: Diagnosis not present

## 2021-05-30 DIAGNOSIS — Z1159 Encounter for screening for other viral diseases: Secondary | ICD-10-CM | POA: Diagnosis not present

## 2021-05-30 DIAGNOSIS — E1169 Type 2 diabetes mellitus with other specified complication: Secondary | ICD-10-CM | POA: Diagnosis not present

## 2021-05-30 DIAGNOSIS — Z1211 Encounter for screening for malignant neoplasm of colon: Secondary | ICD-10-CM | POA: Diagnosis not present

## 2021-05-30 DIAGNOSIS — I429 Cardiomyopathy, unspecified: Secondary | ICD-10-CM | POA: Diagnosis not present

## 2021-06-12 DIAGNOSIS — I509 Heart failure, unspecified: Secondary | ICD-10-CM | POA: Diagnosis not present

## 2021-06-12 DIAGNOSIS — C50511 Malignant neoplasm of lower-outer quadrant of right female breast: Secondary | ICD-10-CM | POA: Diagnosis not present

## 2021-06-12 DIAGNOSIS — E1169 Type 2 diabetes mellitus with other specified complication: Secondary | ICD-10-CM | POA: Diagnosis not present

## 2021-06-12 DIAGNOSIS — E785 Hyperlipidemia, unspecified: Secondary | ICD-10-CM | POA: Diagnosis not present

## 2021-06-12 DIAGNOSIS — I10 Essential (primary) hypertension: Secondary | ICD-10-CM | POA: Diagnosis not present

## 2021-06-15 ENCOUNTER — Other Ambulatory Visit (HOSPITAL_COMMUNITY): Payer: Self-pay | Admitting: Cardiology

## 2021-06-22 ENCOUNTER — Ambulatory Visit (HOSPITAL_COMMUNITY)
Admission: RE | Admit: 2021-06-22 | Discharge: 2021-06-22 | Disposition: A | Discharge status: Home / Self Care | Payer: HMO | Source: Ambulatory Visit | Attending: Cardiology | Admitting: Cardiology

## 2021-06-22 VITALS — BP 120/78 | HR 66 | Wt 143.8 lb

## 2021-06-22 DIAGNOSIS — I428 Other cardiomyopathies: Secondary | ICD-10-CM | POA: Diagnosis not present

## 2021-06-22 DIAGNOSIS — Z8249 Family history of ischemic heart disease and other diseases of the circulatory system: Secondary | ICD-10-CM | POA: Insufficient documentation

## 2021-06-22 DIAGNOSIS — Z7982 Long term (current) use of aspirin: Secondary | ICD-10-CM | POA: Diagnosis not present

## 2021-06-22 DIAGNOSIS — I110 Hypertensive heart disease with heart failure: Secondary | ICD-10-CM | POA: Diagnosis not present

## 2021-06-22 DIAGNOSIS — Z95810 Presence of automatic (implantable) cardiac defibrillator: Secondary | ICD-10-CM | POA: Diagnosis not present

## 2021-06-22 DIAGNOSIS — Z803 Family history of malignant neoplasm of breast: Secondary | ICD-10-CM | POA: Insufficient documentation

## 2021-06-22 DIAGNOSIS — Z853 Personal history of malignant neoplasm of breast: Secondary | ICD-10-CM | POA: Diagnosis not present

## 2021-06-22 DIAGNOSIS — Z794 Long term (current) use of insulin: Secondary | ICD-10-CM | POA: Diagnosis not present

## 2021-06-22 DIAGNOSIS — Z833 Family history of diabetes mellitus: Secondary | ICD-10-CM | POA: Insufficient documentation

## 2021-06-22 DIAGNOSIS — E119 Type 2 diabetes mellitus without complications: Secondary | ICD-10-CM | POA: Insufficient documentation

## 2021-06-22 DIAGNOSIS — I5022 Chronic systolic (congestive) heart failure: Secondary | ICD-10-CM | POA: Diagnosis not present

## 2021-06-22 DIAGNOSIS — E785 Hyperlipidemia, unspecified: Secondary | ICD-10-CM | POA: Diagnosis not present

## 2021-06-22 DIAGNOSIS — Z79899 Other long term (current) drug therapy: Secondary | ICD-10-CM | POA: Insufficient documentation

## 2021-06-22 LAB — DIGOXIN LEVEL: Digoxin Level: 0.3 ng/mL — ABNORMAL LOW (ref 0.8–2.0)

## 2021-06-22 MED ORDER — ISOSORBIDE DINITRATE 20 MG PO TABS: 40.0000 mg | ORAL_TABLET | Freq: Three times a day (TID) | ORAL | 3 refills | Status: DC

## 2021-06-22 MED ORDER — HYDRALAZINE HCL 50 MG PO TABS: 75.0000 mg | ORAL_TABLET | Freq: Three times a day (TID) | ORAL | 3 refills | Status: DC

## 2021-06-22 NOTE — Progress Notes (Signed)
PCP: Dr. Nancy Fetter Cardiology: Dr. Tamala Julian HF Cardiology: Dr. Aundra Dubin Oncology: Dr. Lindi Adie  Toni [name] is a 65 y.o. [place] employee with a history of chronic systolic heart failure, due to nonischemic cardiomyopathy, HTN, DM, and hyperlipidemia.    Initially diagnosed with NICM in 1998 thought to be from HTN versus viral. Had cath in 1998 that was negative for coronary disease. EF at that time was 20% but EF recovered in 2012.    In October 2019, she had a cough/virus and she took OTC meds. Says she would feel better for a little while but then felt bad again. 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  She presented to [facility] ED on 11/13/18 with increased shortness of breath. She was admitted and echo was completed showing EF had gone back down to 15%. She had RHC/LHC with nonobstructive CAD and relatively preserved cardiac output. She was diuresed in the hospital and discharged. CPX in 2/20 showed only mild HF limitation.   Given family history of cardiomyopathy, I sent genetic testing.  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  I had her see Dr. Broadus John, we think that the variant may be benign and unrelated to her cardiomyopathy.  She has no history of conduction disturbance.   Echo in 6/20 showed that EF remains low at 25-30%, mild LV dilation, mildly decreased RV systolic function.  She had a [place] placed.   8/21 diagnosed with right breast cancer, ER+/PR-/HER2+.  She had right lumpectomy.   Echo in 10/21 showed EF 40% with mildly decreased RV systolic function. She has been getting Taxol + Herceptin.  Repeat echo in 11/21 showed EF 30% with mild LV dilation, mildly decreased RV systolic function. Herceptin was stopped and it was decided that she would be a poor candidate for it in the future.   Echo in 1/22 showed EF 25-30%, diffuse hypokinesis, mildly  decreased RV systolic function.  CPX in 3/22 showed mild HF limitation.   She returns for followup of CHF. She has been doing well.  Weight down 5 lbs.  No significant exertional dyspnea, no orthopnea/PND, no chest pain.   [employer] device interrogation: Heartlogic score 0, no VT    Labs (1/20): K 4, creatinine 0.67 => 0.74 Labs (3/20): K 4.1, creatinine 0.87 Labs (6/20): digoxin level 0.9 Labs (7/20): K 4.1, creatinine 0.74 Labs (8/20): digoxin 0.4 Labs (11/20): K 4.5, creatinine 0.65, digoxin 1.2 Labs (2/21): digoxin 0.4, K 4.2, creatinine 0.9 Labs (6/21): K 5.1, creatinine 0.83 Labs (9/21): K 4.6, creatinine 0.89 Labs (11/21): K 4, creatinine 0.78 Labs (1/22): K 4.4, creatinine 0.89 Labs (3/22): K 4.2, creatinine 0.72 Labs (7/22): K 4.8, creatinine 0.92, LDL 63  PMH: 1. HTN 2. Type 2 diabetes 3. Hyperlipidemia 4. Chronic systolic CHF: Nonischemic cardiomyopathy.  Diagnosed in 1998, EF 20% by echo at that time.  Echo back to normal range by 2012.   - LHC/RHC (12/19): D1 60-70% stenosis; mean RA 6, PA 58/22, mean PCWP 22, CI 2.9.  - Echo (12/19): EF 15% with severe LV dilation, moderate central MR likely functional.  - Cardiac MRI (12/19): Moderate LV dilation with EF 14%, mild RV dilation with EF 17%, LGE at the inferior RV insertion site (nonspecific).  - CPX (2/20): peak VO2 18.6, VE/VCO2 31, RER 1.18 => mild HF limitation.  - Genetic testing showed LMNA variant  of uncertain significance: No history of conduction abnormalities.  Suspect the variant is benign.  - Echo (6/20): EF 25-30%, mild LV dilation, mildly decreased RV systolic function.  - Echo (10/21): EF 40%, diffuse hypokinesis, mildly decreased RV systolic function.  - Echo (11/21): EF 30%, diffuse hypokinesis, mildly decreased RV systolic function.  - Echo (1/22): EF 25-30%, diffuse hypokinesis, mildly decreased RV systolic function.  - CPX (3/22): Peak VO2 22, VE/VCO2 slope 33, RER 1.13.  Mild HF limitation.   5. Angioedema with ACEI 6. Left shoulder adhesive capsulitis 7. Depression 8. Breast cancer: 8/21 diagnosed with right breast cancer, ER+/PR-/HER2+.  She had right lumpectomy.  Radiation ongoing.   Social History   Socioeconomic History   Marital status: Single    Spouse name: Not on file   Number of children: Not on file   Years of education: 16   Highest education level: Bachelor's degree (e.g., BA, AB, BS)  Occupational History   Occupation: Surveyor, quantity: [place]  Tobacco Use   Smoking status: Never   Smokeless tobacco: Never  Vaping Use   Vaping Use: Never used  Substance and Sexual Activity   Alcohol use: Yes    Alcohol/week: 0.0 standard drinks    Comment: less than once a month   Drug use: No   Sexual activity: Never  Other Topics Concern   Not on file  Social History Narrative   Works at [employer].  Lives alone.     Social Determinants of Health   Financial Resource Strain: Low Risk    Difficulty of Paying Living Expenses: Not hard at all  Food Insecurity: No Food Insecurity   Worried About Charity fundraiser in the Last Year: Never true   [place] in the Last Year: Never true  Transportation Needs: No Transportation Needs   Lack of Transportation (Medical): No   Lack of Transportation (Non-Medical): No  Physical Activity: Not on file  Stress: Not on file  Social Connections: Not on file  Intimate Partner Violence: Not At Risk   Fear of Current or Ex-Partner: No   Emotionally Abused: No   Physically Abused: No   Sexually Abused: No   Family History  Problem Relation Age of Onset   Diabetes Mellitus I Mother    Lung cancer Mother 43   Breast cancer Mother        dx. late 30s/early 69s   Diabetes Mellitus I Father    Sudden death Father 51   Prostate cancer Brother 14   Hypertension Brother    Hypertension Brother    Diabetes Mellitus I Brother    Benign prostatic hyperplasia Brother    Heart failure Paternal Uncle     Thyroid cancer Niece        dx. in her 40s   Breast cancer Cousin        dx. in her 31s, recurrence in her 3s (maternal first cousin)   Breast cancer Cousin        female dx. in his early 18s (paternal first cousin)   Cancer Cousin        dx. in his early 12s, unknown type (paternal first cousin)   Cancer Cousin 71       unknown type (paternal first cousin)   ROS: All systems reviewed and negative except as per HPI.   Current Outpatient Medications  Medication Sig Dispense Refill   acetaminophen (TYLENOL) 500 MG tablet Take 1,000 mg by mouth  every 6 (six) hours as needed for moderate pain or headache.     anastrozole (ARIMIDEX) 1 MG tablet Take 1 tablet (1 mg total) by mouth daily. 90 tablet 3   aspirin 81 MG chewable tablet Chew 1 tablet (81 mg total) by mouth daily. 30 tablet 0   carvedilol (COREG) 25 MG tablet TAKE ONE TABLET BY MOUTH TWICE DAILY with meals 180 tablet 0   cetirizine (ZYRTEC) 10 MG tablet Take 10 mg by mouth at bedtime.     Continuous Blood Gluc Receiver (FREESTYLE LIBRE 14 DAY READER) DEVI Apply topically as directed.     Continuous Blood Gluc Sensor (FREESTYLE LIBRE 14 DAY SENSOR) MISC Apply topically as directed.     digoxin (LANOXIN) 0.125 MG tablet TAKE 1/2 TABLET BY MOUTH DAILY 45 tablet 3   furosemide (LASIX) 20 MG tablet Take 20 mg by mouth as needed.     glipiZIDE (GLUCOTROL XL) 10 MG 24 hr tablet Take 20 mg by mouth daily with breakfast.      Insulin Glargine (BASAGLAR KWIKPEN) 100 UNIT/ML Inject 5-20 Units into the skin at bedtime as needed (blood sugar 150 or above).     JARDIANCE 25 MG TABS tablet Take 25 mg by mouth daily.     losartan (COZAAR) 50 MG tablet Take 1 tablet (50 mg total) by mouth 2 (two) times daily. 60 tablet 11   metFORMIN (GLUCOPHAGE) 1000 MG tablet Take 1,000 mg by mouth 2 (two) times daily with a meal.     Multiple Vitamin (MULTIVITAMIN WITH MINERALS) TABS tablet Take 1 tablet by mouth daily. Centrum Silver     Semaglutide, 1 MG/DOSE,  (OZEMPIC, 1 MG/DOSE,) 2 MG/1.5ML SOPN 1 mg as directed     simvastatin (ZOCOR) 20 MG tablet Take 20 mg by mouth at bedtime.      spironolactone (ALDACTONE) 25 MG tablet TAKE ONE TABLET BY MOUTH EVERY EVENING 90 tablet 0   TRUE METRIX BLOOD GLUCOSE TEST test strip 1 each by Other route as directed.   5   TRUEPLUS LANCETS 30G MISC 1 each by Other route as directed. Use as directed.  5   UNIFINE PENTIPS 32G X 4 MM MISC 1 each by Other route as directed.      hydrALAZINE (APRESOLINE) 50 MG tablet Take 1.5 tablets (75 mg total) by mouth 3 (three) times daily. 405 tablet 3   isosorbide dinitrate (ISORDIL) 20 MG tablet Take 2 tablets (40 mg total) by mouth 3 (three) times daily. 540 tablet 3   No current facility-administered medications for this encounter.   BP 120/78   Pulse 66   Wt 65.2 kg (143 lb 12.8 oz)   SpO2 96%   BMI 23.93 kg/m  General: NAD Neck: No JVD, no thyromegaly or thyroid nodule.  Lungs: Clear to auscultation bilaterally with normal respiratory effort. CV: Nondisplaced PMI.  Heart regular S1/S2, no S3/S4, no murmur.  No peripheral edema.  No carotid bruit.  Normal pedal pulses.  Abdomen: Soft, nontender, no hepatosplenomegaly, no distention.  Skin: Intact without lesions or rashes.  Neurologic: Alert and oriented x 3.  Psych: Normal affect. Extremities: No clubbing or cyanosis.  HEENT: Normal.   Assessment/Plan: 1. Chronic systolic CHF: Nonischemic cardiomyopathy by 12/19 cath.  Cardiac MRI with LV EF 14%, RV EF 17%. No definite evidence for myocarditis or infiltrative disease by delayed enhancement images.  Most likely cause of cardiomyopathy is familial versus prior viral myocarditis.  Brother also had a cardiomyopathy of uncertain etiology.  Genetic  testing was done, showing an LMNA gene variant of uncertain significance => she saw Dr. Broadus John, suspect benign/uninvolved variant (no conduction abnormality).  CPX in 2/20 showed only mild HF limitation.  Echo in 6/20 showed EF  25-30%. Echo in 10/21 showed EF up some to 40%.  She now has a [place].  She was started on Taxol/Herceptin chemotherapy for breast cancer and 11/21 echo showed fall in EF to 30%.  It was decided that she would not be a candidate for ongoing Herceptin and this medication was stopped.  Echo in 1/22 showed EF 25-30%, diffuse hypokinesis, mildly decreased RV systolic function.  CPX in 3/22 showed mild HF limitation.  On exam and by Heartlogic, she is not volume overloaded.  NYHA class I-II symptoms.  Narrow QRS, not CRT candidate. Weight down.   - Continue Coreg 25 mg bid.   - Continue losartan 50 mg bid.  She cannot take Entresto with history of ACEI angioedema.   - Continue Jardiance.   - She will continue to take Lasix only prn.   - Continue spironolactone 25 mg daily. Recent BMET was stable.  - Continue digoxin, check level.  - Increase hydralazine to 75 mg tid and isordil to 40 mg tid.  - I do not think she is symptomatic enough at this time for barostimulator activation therapy.  2. Type II diabetes: She is on Jardiance.  3. Breast cancer: HER2+. She started Herceptin and Taxol chemotherapy.  EF fell to 30% and Herceptin was stopped.  I do not think Herceptin will be a good option for her going forwards.    Followup in 4 months.    Loralie Champagne 06/22/2021

## 2021-06-22 NOTE — Patient Instructions (Addendum)
Labs done today. We will contact you only if your labs are abnormal.  INCREASE Hydralazine to '75mg'$   (1 & 1/2 tablet) by mouth 3 times daily.   INCREASE Isordil to '40mg'$  (2 tablets) by mouth 3 times daily.   No other medication changes were made. Please continue all current medications as prescribed.  Your physician recommends that you schedule a follow-up appointment in: 4 months  If you have any questions or concerns before your next appointment please send [place] a message through [place] or call our office at [phone].    TO LEAVE A MESSAGE FOR THE NURSE SELECT OPTION 2, PLEASE LEAVE A MESSAGE INCLUDING: YOUR NAME DATE OF BIRTH CALL BACK NUMBER REASON FOR CALL**this is important as we prioritize the call backs  YOU WILL RECEIVE A CALL BACK THE SAME DAY AS LONG AS YOU CALL BEFORE 4:00 PM   Do the following things EVERYDAY: Weigh yourself in the morning before breakfast. Write it down and keep it in a log. Take your medicines as prescribed Eat low salt foods--Limit salt (sodium) to 2000 mg per day.  Stay as active as you can everyday Limit all fluids for the day to less than 2 liters   At the [facility], you and your health needs are our priority. As part of our continuing mission to provide you with exceptional heart care, we have created designated Provider Care Teams. These Care Teams include your primary Cardiologist (physician) and Advanced Practice Providers (APPs- Physician Assistants and Nurse Practitioners) who all work together to provide you with the care you need, when you need it.   You may see any of the following providers on your designated Care Team at your next follow up: Dr Glori Bickers Dr Haynes Kerns, NP Lyda Jester, [place] Audry Riles, PharmD   Please be sure to bring in all your medications bottles to every appointment.

## 2021-06-28 ENCOUNTER — Ambulatory Visit (INDEPENDENT_AMBULATORY_CARE_PROVIDER_SITE_OTHER): Payer: HMO

## 2021-06-28 DIAGNOSIS — I428 Other cardiomyopathies: Secondary | ICD-10-CM | POA: Diagnosis not present

## 2021-06-29 LAB — CUP PACEART REMOTE DEVICE CHECK
Battery Remaining Longevity: 156 mo
Battery Remaining Percentage: 100 %
Brady Statistic RV Percent Paced: 0 %
Date Time Interrogation Session: 20220811092700
HighPow Impedance: 64 Ohm
Implantable Lead Implant Date: 20200730
Implantable Lead Location: 753860
Implantable Lead Model: 292
Implantable Lead Serial Number: 447014
Implantable Pulse Generator Implant Date: 20200730
Lead Channel Impedance Value: 431 Ohm
Lead Channel Pacing Threshold Amplitude: 1.4 V
Lead Channel Pacing Threshold Pulse Width: 0.4 ms
Lead Channel Setting Pacing Amplitude: 3.5 V
Lead Channel Setting Pacing Pulse Width: 0.4 ms
Lead Channel Setting Sensing Sensitivity: 0.5 mV
Pulse Gen Serial Number: 266091

## 2021-07-04 ENCOUNTER — Other Ambulatory Visit: Payer: Self-pay

## 2021-07-13 DIAGNOSIS — C50511 Malignant neoplasm of lower-outer quadrant of right female breast: Secondary | ICD-10-CM | POA: Diagnosis not present

## 2021-07-13 DIAGNOSIS — U071 COVID-19: Secondary | ICD-10-CM | POA: Diagnosis not present

## 2021-07-13 DIAGNOSIS — I10 Essential (primary) hypertension: Secondary | ICD-10-CM | POA: Diagnosis not present

## 2021-07-13 DIAGNOSIS — I509 Heart failure, unspecified: Secondary | ICD-10-CM | POA: Diagnosis not present

## 2021-07-13 DIAGNOSIS — E785 Hyperlipidemia, unspecified: Secondary | ICD-10-CM | POA: Diagnosis not present

## 2021-07-13 DIAGNOSIS — E1169 Type 2 diabetes mellitus with other specified complication: Secondary | ICD-10-CM | POA: Diagnosis not present

## 2021-07-14 ENCOUNTER — Telehealth (HOSPITAL_COMMUNITY): Payer: Self-pay | Admitting: *Deleted

## 2021-07-14 NOTE — Telephone Encounter (Signed)
Pt called to let us know her PCP started her on Molnupiravir for Covid and wanted to be sure their were no interactions, per Audry Riles, pharm D no interactions, pt aware

## 2021-07-20 NOTE — Progress Notes (Signed)
Remote ICD transmission.   

## 2021-08-02 DIAGNOSIS — E785 Hyperlipidemia, unspecified: Secondary | ICD-10-CM | POA: Diagnosis not present

## 2021-08-02 DIAGNOSIS — I10 Essential (primary) hypertension: Secondary | ICD-10-CM | POA: Diagnosis not present

## 2021-08-02 DIAGNOSIS — E1169 Type 2 diabetes mellitus with other specified complication: Secondary | ICD-10-CM | POA: Diagnosis not present

## 2021-08-02 DIAGNOSIS — E1165 Type 2 diabetes mellitus with hyperglycemia: Secondary | ICD-10-CM | POA: Diagnosis not present

## 2021-08-02 DIAGNOSIS — I509 Heart failure, unspecified: Secondary | ICD-10-CM | POA: Diagnosis not present

## 2021-08-07 ENCOUNTER — Telehealth: Payer: Self-pay | Admitting: Emergency Medicine

## 2021-08-07 NOTE — Telephone Encounter (Signed)
S1714 - A Prospective Observational Cohort Study to Develop a Predictive Model of Taxane-Induced Peripheral Neuropathy in Cancer Patients  08/07/21  10:07am: Called to schedule next research visit.  No answer, left voicemail requesting return call.  Clabe Seal Clinical Research Coordinator I  08/07/21  10:23 AM

## 2021-08-10 ENCOUNTER — Telehealth: Payer: Self-pay | Admitting: Emergency Medicine

## 2021-08-10 NOTE — Telephone Encounter (Signed)
S1714 - A Prospective Observational Cohort Study to Develop a Predictive Model of Taxane-Induced Peripheral Neuropathy in Cancer Patients  08/10/21  Called to schedule next research visit for this study.  Patient does not currently have a visit scheduled with Dr. Lindi Adie.  The patient agreed to have a visit scheduled for the early part of November to see Dr. Lindi Adie and to have her research visit.    Patient denied further questions at this time.  She was made aware someone would be reaching out from the scheduling department to schedule this appointment.  Clabe Seal Clinical Research Coordinator I  08/10/21  3:55 PM

## 2021-08-17 ENCOUNTER — Other Ambulatory Visit: Payer: Self-pay | Admitting: Gastroenterology

## 2021-08-17 DIAGNOSIS — Z794 Long term (current) use of insulin: Secondary | ICD-10-CM | POA: Diagnosis not present

## 2021-08-17 DIAGNOSIS — K760 Fatty (change of) liver, not elsewhere classified: Secondary | ICD-10-CM | POA: Diagnosis not present

## 2021-08-17 DIAGNOSIS — E1165 Type 2 diabetes mellitus with hyperglycemia: Secondary | ICD-10-CM | POA: Diagnosis not present

## 2021-08-17 DIAGNOSIS — K5900 Constipation, unspecified: Secondary | ICD-10-CM | POA: Diagnosis not present

## 2021-08-17 DIAGNOSIS — I509 Heart failure, unspecified: Secondary | ICD-10-CM | POA: Diagnosis not present

## 2021-08-17 DIAGNOSIS — Z1211 Encounter for screening for malignant neoplasm of colon: Secondary | ICD-10-CM | POA: Diagnosis not present

## 2021-08-17 DIAGNOSIS — Z7984 Long term (current) use of oral hypoglycemic drugs: Secondary | ICD-10-CM | POA: Diagnosis not present

## 2021-09-04 NOTE — Progress Notes (Signed)
Patient Care Team: Donald Prose, MD as PCP - General (Family Medicine) Belva Crome, MD as PCP - Cardiology (Cardiology) Larey Dresser, MD as PCP - Advanced Heart Failure (Cardiology) Coralie Keens, MD as Consulting Physician (General Surgery) Nicholas Lose, MD as Consulting Physician (Hematology and Oncology) Kyung Rudd, MD as Consulting Physician (Radiation Oncology)  DIAGNOSIS:    ICD-10-CM   1. Malignant neoplasm of lower-outer quadrant of right breast of female, estrogen receptor positive ([place])  C50.511    Z17.0       SUMMARY OF ONCOLOGIC HISTORY: Oncology History  Malignant neoplasm of lower-outer quadrant of right breast of female, estrogen receptor positive ([place])  07/01/2020 Initial Diagnosis   Screening mammogram showed a right breast mass. Mammogram and US showed a 0.7cm mass at the 6 o'clock position in the right breast, no axillary adenopathy. 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    07/06/2020 Cancer Staging   Staging form: Breast, AJCC 8th Edition - Clinical stage from 07/06/2020: Stage IB (cT1b, cN0, cM0, G3, ER+, PR-, HER2-)   07/26/2020 Genetic Testing   Negative genetic testing:  No pathogenic variants detected on the [facility] Common Hereditary Cancers Panel. The report date is 07/26/2020.   The Common Hereditary Cancers Panel offered by [facility] includes sequencing and/or deletion duplication testing of the following 48 genes: APC, ATM, AXIN2, BARD1, BMPR1A, BRCA1, BRCA2, BRIP1, CDH1, CDK4, CDKN2A (p14ARF), CDKN2A (p16INK4a), CHEK2, CTNNA1, DICER1, EPCAM (Deletion/duplication testing only), GREM1 (promoter region deletion/duplication testing only), KIT, MEN1, MLH1, MSH2, MSH3, MSH6, MUTYH, NBN, NF1, NTHL1, PALB2, PDGFRA, PMS2, POLD1, POLE, PTEN, RAD50, RAD51C, RAD51D, RNF43, SDHB, SDHC, SDHD, SMAD4, SMARCA4. STK11, TP53, TSC1, TSC2, and VHL.  The following genes were evaluated for  sequence changes only: SDHA and HOXB13 c.251G>A variant only.   08/11/2020 Surgery   Right lumpectomy [name]) [phone]): IDC, grade 3, 0.8cm, clear margins, 3 right axillary lymph nodes negative for carcinoma. ER 30% weak, PR 0%, HER-2 positive, Ki-67 40%   08/18/2020 Cancer Staging   Staging form: Breast, AJCC 8th Edition - Pathologic stage from 08/18/2020: Stage IA (pT1b, pN0, cM0, G3, ER+, PR-, HER2+)   09/08/2020 -  Adjuvant Chemotherapy   Weekly Taxol/herceptin x 12 (09/08/2020 - 11/24/2020). Herceptin was stopped after first 5 weekly cycles (last received on 10/06/2020) due to decline in EF from 40% to 30%.     12/15/2020 - 01/11/2021 Radiation Therapy   The patient initially received a dose of 42.56 Gy in 16 fractions to the breast using whole-breast tangent fields. This was delivered using a 3-D conformal technique. The pt received a boost delivering an additional 8 Gy in 4 fractions using a electron boost with 32mV electrons. The total dose was 50.56 Gy.   12/2020 -  Anti-estrogen oral therapy   Anastrozole     CHIEF COMPLIANT: Follow-up of breast cancer  INTERVAL HISTORY: SMccall Willis a 65y.o. with above-mentioned history of breast cancer having undergone lumpectomy, adjuvant chemotherapy, and radiation therapy, currently on antiestrogen therapy with anastrozole. She presents to the clinic today for follow-up.  She is tolerating anastrozole extremely well without any problems or concerns.  She does have mild hot flashes.  Does not have any joint stiffness since she started taking it at bedtime.  ALLERGIES:  is allergic to biaxin [clarithromycin] and vasotec [enalapril].  MEDICATIONS:  Current Outpatient Medications  Medication Sig Dispense Refill   acetaminophen (TYLENOL) 500 MG tablet  Take 1,000 mg by mouth every 6 (six) hours as needed for moderate pain or headache.     anastrozole (ARIMIDEX) 1 MG tablet Take 1 tablet (1 mg total) by mouth daily. 90 tablet 3    aspirin 81 MG chewable tablet Chew 1 tablet (81 mg total) by mouth daily. 30 tablet 0   carvedilol (COREG) 25 MG tablet TAKE ONE TABLET BY MOUTH TWICE DAILY with meals 180 tablet 0   cetirizine (ZYRTEC) 10 MG tablet Take 10 mg by mouth at bedtime.     Continuous Blood Gluc Receiver (FREESTYLE LIBRE 14 DAY READER) DEVI Apply topically as directed.     Continuous Blood Gluc Sensor (FREESTYLE LIBRE 14 DAY SENSOR) MISC Apply topically as directed.     digoxin (LANOXIN) 0.125 MG tablet TAKE 1/2 TABLET BY MOUTH DAILY 45 tablet 3   furosemide (LASIX) 20 MG tablet Take 20 mg by mouth as needed.     glipiZIDE (GLUCOTROL XL) 10 MG 24 hr tablet Take 20 mg by mouth daily with breakfast.      hydrALAZINE (APRESOLINE) 50 MG tablet Take 1.5 tablets (75 mg total) by mouth 3 (three) times daily. 405 tablet 3   Insulin Glargine (BASAGLAR KWIKPEN) 100 UNIT/ML Inject 5-20 Units into the skin at bedtime as needed (blood sugar 150 or above).     isosorbide dinitrate (ISORDIL) 20 MG tablet Take 2 tablets (40 mg total) by mouth 3 (three) times daily. 540 tablet 3   JARDIANCE 25 MG TABS tablet Take 25 mg by mouth daily.     losartan (COZAAR) 50 MG tablet Take 1 tablet (50 mg total) by mouth 2 (two) times daily. 60 tablet 11   metFORMIN (GLUCOPHAGE) 1000 MG tablet Take 1,000 mg by mouth 2 (two) times daily with a meal.     Multiple Vitamin (MULTIVITAMIN WITH MINERALS) TABS tablet Take 1 tablet by mouth daily. Centrum Silver     Semaglutide, 1 MG/DOSE, (OZEMPIC, 1 MG/DOSE,) 2 MG/1.5ML SOPN 1 mg as directed     simvastatin (ZOCOR) 20 MG tablet Take 20 mg by mouth at bedtime.      spironolactone (ALDACTONE) 25 MG tablet TAKE ONE TABLET BY MOUTH EVERY EVENING 90 tablet 0   TRUE METRIX BLOOD GLUCOSE TEST test strip 1 each by Other route as directed.   5   TRUEPLUS LANCETS 30G MISC 1 each by Other route as directed. Use as directed.  5   UNIFINE PENTIPS 32G X 4 MM MISC 1 each by Other route as directed.      No current  facility-administered medications for this visit.    PHYSICAL EXAMINATION: ECOG PERFORMANCE STATUS: 1 - Symptomatic but completely ambulatory  Vitals:   09/05/21 0909  BP: 91/61  Pulse: 92  Resp: 18  Temp: 97.8 F (36.6 C)  SpO2: 99%   Filed Weights   09/05/21 0909  Weight: 140 lb (63.5 kg)    BREAST: No palpable masses or nodules in either right or left breasts. No palpable axillary supraclavicular or infraclavicular adenopathy no breast tenderness or nipple discharge. (exam performed in the presence of a chaperone)  LABORATORY DATA:  I have reviewed the data as listed CMP Latest Ref Rng & Units 02/21/2021 01/27/2021 12/20/2020  Glucose 70 - 99 mg/dL 128(H) 155(H) 137(H)  BUN 8 - 23 mg/dL 16 15 17   Creatinine 0.44 - 1.00 mg/dL 0.83 0.72 0.79  Sodium 135 - 145 mmol/L 140 138 140  Potassium 3.5 - 5.1 mmol/L 4.9 4.2 4.8  Chloride  98 - 111 mmol/L 104 104 103  CO2 22 - 32 mmol/L 28 26 26   Calcium 8.9 - 10.3 mg/dL 10.4(H) 9.5 9.9  Total Protein 6.5 - 8.1 g/dL - - -  Total Bilirubin 0.3 - 1.2 mg/dL - - -  Alkaline Phos 38 - 126 U/L - - -  AST 15 - 41 U/L - - -  ALT 0 - 44 U/L - - -    Lab Results  Component Value Date   WBC 5.5 01/27/2021   HGB 14.1 01/27/2021   HCT 44.9 01/27/2021   MCV 95.5 01/27/2021   PLT 265 01/27/2021   NEUTROABS 2.3 11/24/2020    ASSESSMENT & PLAN:  Malignant neoplasm of lower-outer quadrant of right breast of female, estrogen receptor positive ([place]) 07/01/2020:Screening mammogram showed a right breast mass. Mammogram and US showed a 0.7cm mass at the 6 o'clock position in the right breast, no axillary adenopathy. 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  T1BN0 stage Ia   08/11/2020:Right lumpectomy [name]): IDC, grade 3, 0.8cm, clear margins, 3 right axillary lymph nodes negative for carcinoma.  ER 30% week, PR 0%, HER-2 positive, Ki-67 40%   Treatment plan: 1.  adjuvant Taxol Herceptin/Herceptin discontinued after 5 cycles due to worsening cardiomyopathy, Taxol completed 11/24/20 2.  Adjuvant radiation therapy completed 01/11/21 3.  Follow-up adjuvant antiestrogen therapy Patient is participating in the neuropathy clinical trial SWOG S1714 --------------------------------------------------------------------------------------------------------------------------------------- Current treatment: Anti estrogen therapy with anastrozole to start 01/13/2021   She has a grade 1 peripheral sensory neuropathy.   echocardiogram on 12/20/2020. EF 25-30%: She follows with Dr. Algernon Huxley.  Herceptin discontinued   Anastrozole toxicities: Hot flashes: But they are not bothering her significantly Joint stiffness: Resolved after taking it at bedtime  Breast cancer surveillance: 1.  Breast exam 09/05/2021: Benign 2. mammogram and bone density been scheduled for 10/23/2021   RTC in 1 year for follow-up    No orders of the defined types were placed in this encounter.  The patient has a good understanding of the overall plan. she agrees with it. she will call with any problems that may develop before the next visit here.  Total time spent: 20 mins including face to face time and time spent for planning, charting and coordination of care  Rulon Eisenmenger, MD, MPH 09/05/2021  I, Thana Ates, am acting as scribe for Dr. Nicholas Lose.  I have reviewed the above documentation for accuracy and completeness, and I agree with the above.

## 2021-09-05 ENCOUNTER — Other Ambulatory Visit: Payer: Self-pay

## 2021-09-05 ENCOUNTER — Inpatient Hospital Stay: Payer: HMO | Attending: Hematology and Oncology | Admitting: Hematology and Oncology

## 2021-09-05 ENCOUNTER — Encounter: Payer: Self-pay | Admitting: Emergency Medicine

## 2021-09-05 DIAGNOSIS — Z923 Personal history of irradiation: Secondary | ICD-10-CM | POA: Insufficient documentation

## 2021-09-05 DIAGNOSIS — Z79811 Long term (current) use of aromatase inhibitors: Secondary | ICD-10-CM | POA: Diagnosis not present

## 2021-09-05 DIAGNOSIS — Z170 Estrogen receptor positive status [ER+]: Secondary | ICD-10-CM | POA: Insufficient documentation

## 2021-09-05 DIAGNOSIS — C50511 Malignant neoplasm of lower-outer quadrant of right female breast: Secondary | ICD-10-CM | POA: Diagnosis not present

## 2021-09-05 DIAGNOSIS — Z9221 Personal history of antineoplastic chemotherapy: Secondary | ICD-10-CM | POA: Diagnosis not present

## 2021-09-05 NOTE — Assessment & Plan Note (Signed)
07/01/2020:Screening mammogram showed a right breast mass. Mammogram and US showed a 0.7cm mass at the 6 o'clock position in the right breast, no axillary adenopathy. 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 T1BN0 stage Ia  08/11/2020:Right lumpectomy ([facility]): IDC, grade 3, 0.8cm, clear margins, 3 right axillary lymph nodes negative for carcinoma.ER 30% week, PR 0%, HER-2 positive, Ki-67 40%  Treatment plan: 1.adjuvant Taxol Herceptin/Herceptin discontinued after 5 cycles due to worsening cardiomyopathy, Taxol completed 11/24/20 2.Adjuvant radiation therapy completed 01/11/21 3.Follow-up adjuvant antiestrogen therapy Patient is participating in the neuropathy clinical trial SWOG S1714 --------------------------------------------------------------------------------------------------------------------------------------- Current treatment: Anti estrogen therapy with anastrozole to start 01/13/2021  She has a grade 1 peripheral sensory neuropathy.  echocardiogram on 12/20/2020. EF 25-30%: She follows with Dr. McClain.  Herceptin discontinued   Anastrozole toxicities:  Breast cancer surveillance: 1.  Breast exam 09/05/2021: Benign 2. mammogram and bone density been scheduled for 10/23/2021  RTC in 1 year for follow-up 

## 2021-09-05 NOTE — Research (Signed)
S0630, A PROSPECTIVE OBSERVATIONAL COHORT STUDY TO DEVELOP A PREDICTIVE MODEL OF TAXANE-INDUCED PERIPHERAL NEUROPATHY IN CANCER PATIENTS.   09/05/21 - 52 Weeks Assessment   Patient presents unaccompanied to the clinic today for her 52 week assessment on study.   PROs: Patient was provided with questionnaires to complete.  Patient did not answer two pages of the questionnaires in the clinic today (last page of the PROMIS-29, and the FACT/GOG-NTX-4).  The patient was called at home after the visit today to obtain answers to these questions.    Medications, Supplements, Topical Agents and other treatments: Reviewed medication list with patient and updated for accuracy.  She reports she is not currently taking tylenol.  Patient states she takes centrum silver multivitamin once daily.  She was contacted at home after the visit via phone to confirm doses of vitamins in this supplement and reported the supplement contains 15.8mg  of vitamin E, 5mg  of vitamin B6, and 90mcg of vitamin B12.  Labs: Patient did not consent to the optional whole blood collection.   Physician Assessments: CTCAE and Treatment Burden forms reviewed and signed by Dr. Lindi Adie.  History of Falls: Completed. Patient reports no falls.  Timed Get Up and Go: Completed and timed at 8.6 seconds.   Assessment for Interventions for CIPN: Reviewed with patient and CRFs completed.  Neuropen Assessment: Completed per protocol by Foye Spurling, certified research RN, recording of time was done by this research coordinator.  [place] Assessment: Completed per protocol by Foye Spurling, certified research RN, recording of time was done by this Research officer, political party.  Plan: The patient was informed she will receive a phone call in approximately one year to complete the week 104 assessments.  The patient denied having any questions at this time. Patient thanked for her time and continued support of study and was encouraged to call clinic or  myself with any questions or concerns she may have.   Clabe Seal Clinical Research Coordinator I  09/05/21 10:03 AM

## 2021-09-11 DIAGNOSIS — I509 Heart failure, unspecified: Secondary | ICD-10-CM | POA: Diagnosis not present

## 2021-09-11 DIAGNOSIS — I10 Essential (primary) hypertension: Secondary | ICD-10-CM | POA: Diagnosis not present

## 2021-09-11 DIAGNOSIS — E1165 Type 2 diabetes mellitus with hyperglycemia: Secondary | ICD-10-CM | POA: Diagnosis not present

## 2021-09-11 DIAGNOSIS — E1169 Type 2 diabetes mellitus with other specified complication: Secondary | ICD-10-CM | POA: Diagnosis not present

## 2021-09-11 DIAGNOSIS — E785 Hyperlipidemia, unspecified: Secondary | ICD-10-CM | POA: Diagnosis not present

## 2021-09-13 ENCOUNTER — Other Ambulatory Visit (HOSPITAL_COMMUNITY): Payer: Self-pay | Admitting: Cardiology

## 2021-09-27 ENCOUNTER — Ambulatory Visit (INDEPENDENT_AMBULATORY_CARE_PROVIDER_SITE_OTHER): Payer: HMO

## 2021-09-27 DIAGNOSIS — I428 Other cardiomyopathies: Secondary | ICD-10-CM

## 2021-09-27 LAB — CUP PACEART REMOTE DEVICE CHECK
Battery Remaining Longevity: 150 mo
Battery Remaining Percentage: 100 %
Brady Statistic RV Percent Paced: 0 %
Date Time Interrogation Session: 20221109082900
HighPow Impedance: 71 Ohm
Implantable Lead Implant Date: 20200730
Implantable Lead Location: 753860
Implantable Lead Model: 292
Implantable Lead Serial Number: 447014
Implantable Pulse Generator Implant Date: 20200730
Lead Channel Impedance Value: 447 Ohm
Lead Channel Pacing Threshold Amplitude: 1.4 V
Lead Channel Pacing Threshold Pulse Width: 0.4 ms
Lead Channel Setting Pacing Amplitude: 3.5 V
Lead Channel Setting Pacing Pulse Width: 0.4 ms
Lead Channel Setting Sensing Sensitivity: 0.5 mV
Pulse Gen Serial Number: 266091

## 2021-10-05 NOTE — Progress Notes (Signed)
Remote ICD transmission.   

## 2021-10-09 NOTE — Anesthesia Preprocedure Evaluation (Addendum)
Anesthesia Evaluation  Patient identified by MRN, date of birth, ID band  Reviewed: Allergy & Precautions, NPO status , Patient's Chart, lab work & pertinent test results  Airway Mallampati: II  TM Distance: >3 FB Neck ROM: Full    Dental no notable dental hx. (+) Partial Upper, Dental Advisory Given   Pulmonary neg pulmonary ROS,    Pulmonary exam normal breath sounds clear to auscultation       Cardiovascular hypertension, + CAD and +CHF  Normal cardiovascular exam+ Cardiac Defibrillator  Rhythm:Regular Rate:Normal  12/2020 TTE 1. Left ventricular ejection fraction, by estimation, is 25 to 30%. The  left ventricle has severely decreased function. The left ventricle  demonstrates global hypokinesis. The left ventricular internal cavity size  was mildly dilated. Left ventricular  diastolic parameters are consistent with Grade I diastolic dysfunction  (impaired relaxation).  2. Right ventricular systolic function is mildly reduced. The right  ventricular size is normal. Tricuspid regurgitation signal is inadequate  for assessing PA pressure.  3. The mitral valve is normal in structure. Mild mitral valve  regurgitation. No evidence of mitral stenosis.  4. The aortic valve is tricuspid. Aortic valve regurgitation is not  visualized. No aortic stenosis is present.  5. The inferior vena cava is normal in size with greater than 50%  respiratory variability, suggesting right atrial pressure of 3 mmHg.    Neuro/Psych negative neurological ROS  negative psych ROS   GI/Hepatic   Endo/Other  diabetes  Renal/GU      Musculoskeletal   Abdominal   Peds  Hematology   Anesthesia Other Findings   Reproductive/Obstetrics                            Anesthesia Physical Anesthesia Plan  ASA: 4  Anesthesia Plan: MAC   Post-op Pain Management: Minimal or no pain anticipated   Induction:   PONV Risk  Score and Plan: 4 or greater and Treatment may vary due to age or medical condition and Ondansetron  Airway Management Planned: Natural Airway and Simple Face Mask  Additional Equipment: None  Intra-op Plan:   Post-operative Plan:   Informed Consent: I have reviewed the patients History and Physical, chart, labs and discussed the procedure including the risks, benefits and alternatives for the proposed anesthesia with the patient or authorized representative who has indicated his/her understanding and acceptance.     Dental advisory given  Plan Discussed with: CRNA and Anesthesiologist  Anesthesia Plan Comments: (screening coonoscopy)      Anesthesia Quick Evaluation

## 2021-10-09 NOTE — H&P (Signed)
History of Present Illness  General:          65 year old female with history of hypertension, hyperlipidemia, diabetes, dilated cardiomyopathy followed by Dr. Aundra Dubin, last echo was 12/2020 which showed 25-30 %, has AICD.         Patient had colonoscopy 03/2011 with Dr. Wynetta Emery that was normal.         Patient denies dysphagia, GERD, nausea, vomiting, melena.         She has history of constipation. Patient denies change in bowel habits, hematochezia.         Denies changes in appetite, unintentional weight loss.         No family history of GI malignancy.      Current Medications  Taking  Cozaar(Losartan Potassium) 50 MG Tablet 1 tablet Orally twice a day, Notes: Losartan Digoxin 0.125 mg Tablet 1/2 tab orally daily FreeStyle Libre 14 Day Reader - Device as directed as directed, Notes: Main thing uses [employer] 14 Day Sensor - Miscellaneous USE AS DIRECTED  Furosemide 20 MG Tablet 1 tablet Orally once a day as needed, Notes: prn glipiZIDE ER 10 mg Tablet Extended Release 24 Hour TAKE TWO TABLETS BY MOUTH ONCE DAILY  hydrALAZINE HCl 50 MG Tablet 1 and 1/2 tablets with food Orally Three times a day Isosorbide Dinitrate 20 MG Tablet 2 tablets Orally three times a day Jardiance 25mg  25 MG Tablet 1 tablet by mouth once daily, taken in the morning, with or without food metFORMIN HCl 1000 MG Tablet TAKE ONE TABLET BY MOUTH TWICE DAILY  OneTouch Delica Plus [id number] - Miscellaneous USE AS DIRECTED finger stick twice A DAY  Ozempic (1 MG/DOSE)(Semaglutide (1 MG/DOSE)) 2 MG/1.5ML Solution Pen-injector 1 mg as directed Subcutaneous Once a week Simvastatin 20 mg Tablet TAKE ONE TABLET BY MOUTH ONCE DAILY  Spironolactone 25 MG Tablet 1 tablet Orally Once a day Anastrozole 1 MG Tablet 1 tablet Orally Once a day Carvedilol 25 MG Tablet 1 tablet with food Orally twice a day BD Pen Needle Ultrafine 4 mm X 32 G Dx: E11.29 pen needle use when injecting insulin subQ once a day Unifine  Pentips(Insulin Pen Needle) 32G X 4 MM Miscellaneous USE TO INJECT INSULIN ONCE A DAY  Aspirin 81 MG Tablet Chewable 1 tablet Orally Once a day, Notes: Hospital ZyrTEC Allergy(Cetirizine HCl) 10 MG Tablet 1 tablet Orally Once a day, Notes: prn Centrum Silver 50+Women - Tablet as directed Orally once a day Not-Taking  Molnupiravir 200 MG Capsule 4 capsules Orally every 12 hrs   Medication List reviewed and reconciled with the patient    Past Medical History       Diabetes.      Hypertension.      hyperlipidemia/elevated TG ('02).      Obesity.      congestive heart failure- Dr Aundra Dubin.      dilated cardiomyopathy (viral) NYHA Class II (1998)--Dr. Cordelia Poche at [facility] now followed by Dr Marigene Ehlers.      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      Left breast mass/fibroglandular tissue suspected on mammo/u/s.     G0, P0.      L thyroid nodule x 3 (multinodular goiter).      My Li (optometrist) at [facility] on [address].      H/O angioedema with ACEI.      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      grade III invasive mammary carcinoma on biopsy of right breast (8/ 2021)  treated with lumpectomy, chemo and radiation- Dr Lindi Adie.    Surgical History        hysterectomy for menometrorrhagia-Dr. Norins (still has ovaries) 2000        thyroid FNA (L nodule)--benign 5/07        tooth extraction 2020        ICD 2020        AAA repair         cardiac catheterization 1998        right breast lumpectomy with radioactive seed and setinal lymph node bx- Dr [name] 07/2020        portacath placement 08/2020        portacath removal 01/2021       Family History  Father: deceased, complications of DM, hypertension, MI (35)  Mother: deceased 52 yrs, lung cancer; DM, HTN, Breast cancer (54s)  Brother 1: alive, HTN, prostate cancer  Brother2: alive, HTN, DM, BPH  no colon or ovarian cancer, no CAD or CVA <50 No family hx of colon cancer, polyps or liver disease.       Social History   General:   Tobacco use  cigarettes: Former smoker socially in the past (once a month while playing cards) for about 3-4 years, over 20 years ago, Tobacco history last updated 08/17/2021, Vaping No. Alcohol: rare:wine. Caffeine: tea, 2 servings daily, coffee, very little. Recreational drug use: no. Exercise: nothing structured. DENTAL CARE: See a dentist as needed. Marital Status: single. 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 Religion: Methodist; [place]. Seat belt use: yes.      Allergies  Vasotec: angioedema - Allergy  Biaxin: abdominal pain - Side Effects       Hospitalization/Major Diagnostic Procedure  dilated cardiomyopathy (inpatient for w/u, negative cardiac cath) 12/1996  CHF 12/19  none within the last yr 07/2021       Review of Systems  GI PROCEDURE:         Pacemaker/ AICD no. Artificial heart valves no. MI/heart attack no. Abnormal heart rhythm no. Angina no. CVA no. Hypertension YES. Hypotension no. Asthma, COPD no. Sleep apnea no. Seizure disorders no. Artificial joints no. Severe DJD no. Diabetes YES, type II. Significant headaches no. Vertigo no. Depression/anxiety no. Abnormal bleeding no. Kidney Disease no. Liver disease no. Chance of pregnancy no. Blood transfusion no.       Vital Signs  Wt 141.2, Wt change -1.6 lb, Ht 65, BMI 23.49, Temp 97.3, Pulse sitting 88, BP sitting 101/64.    Examination  Gastroenterology Exam:       GENERAL APPEARANCE: Well developed, well nourished, no active distress, pleasant. SCLERA: anicteric. NECK Full ROM, trachea midline, no thyromegaly or masses. RESPIRATORY clear to auscultation bilaterally , no crackles or wheezes. CARDIOVASCULAR RRR no murmur, AICD left upper chest. ABDOMEN Soft, bowel sounds present, no distension, non tender AB , no hepatosplenomegaly. EXTREMITIES: No edema, pulses intact. PSYCHIATRIC Alert and oriented x3, mood and affect appear normal.. NEURO: alert, normal  strength and tone.       Assessments    1. Screening for colon cancer - Z12.11 (Primary)  2. Constipation, unspecified - K59.00  3. CHF (congestive heart failure) - I50.9  4. Type 2 diabetes mellitus with hyperglycemia - E11.65  5. Fatty liver - K76.0     Treatment  1. Screening for colon cancer        IMAGING: Colonoscopy  [place] 08/17/2021 10:17:57 AM > Scheduled 10/10/2021 8:45am at [facility], RX(TRILYTE) was reviewed and given to the patient   Clinical Notes: Long discussion with patient about colonoscopy in hospital versus virtual, we will proceed with a colonoscopy in the hospital. We have discussed the risks of bleeding, infection, perforation, medication reactions, a 10-20% miss rate for small colon cancer or polyp and remote risk of death associated with colonoscopy. All questions were answered and the patient acknowledges these risk and wishes to proceed.      2. Constipation, unspecified   Notes: Recommend starting on a fiber supplement, can try metamucil first but if this causes gas/bloating switch to benefiber- Take with with a full 8 oz glass of water once a day. This can take 1 month to start helping, so try for at least one month.  Add on miralax 1/2-1 capful daily, will take 3-4 days for it to start working.  Get a squatty potty to use at home or try a stool, goal is to get your knees above your hips during a bowel movement.      3. CHF (congestive heart failure)   Clinical Notes: Has AICD, last EF 25-30% 12/2020, will set up colonoscopy in the hospital.  Well controlled with no symptoms at this time.      4. Type 2 diabetes mellitus with hyperglycemia   Clinical Notes: Patient is on long acting insulin AS needed.      5. Fatty liver   Notes: The only definitive therapy is weight loss and exercise.  Suggest doing something to move daily, such as going for a walk.  Decreasing carbohydrates, increasing veggies.

## 2021-10-10 ENCOUNTER — Encounter (HOSPITAL_COMMUNITY)
Admission: RE | Disposition: A | Discharge status: Home / Self Care | Payer: Self-pay | Source: Ambulatory Visit | Attending: Gastroenterology

## 2021-10-10 ENCOUNTER — Ambulatory Visit (HOSPITAL_COMMUNITY): Payer: HMO | Admitting: Anesthesiology

## 2021-10-10 ENCOUNTER — Other Ambulatory Visit: Payer: Self-pay

## 2021-10-10 ENCOUNTER — Encounter (HOSPITAL_COMMUNITY): Payer: Self-pay | Admitting: Gastroenterology

## 2021-10-10 ENCOUNTER — Ambulatory Visit (HOSPITAL_COMMUNITY)
Admission: RE | Admit: 2021-10-10 | Discharge: 2021-10-10 | Disposition: A | Discharge status: Home / Self Care | Payer: HMO | Source: Ambulatory Visit | Attending: Gastroenterology | Admitting: Gastroenterology

## 2021-10-10 DIAGNOSIS — K635 Polyp of colon: Secondary | ICD-10-CM | POA: Insufficient documentation

## 2021-10-10 DIAGNOSIS — I2510 Atherosclerotic heart disease of native coronary artery without angina pectoris: Secondary | ICD-10-CM | POA: Insufficient documentation

## 2021-10-10 DIAGNOSIS — K621 Rectal polyp: Secondary | ICD-10-CM | POA: Insufficient documentation

## 2021-10-10 DIAGNOSIS — I420 Dilated cardiomyopathy: Secondary | ICD-10-CM | POA: Insufficient documentation

## 2021-10-10 DIAGNOSIS — E785 Hyperlipidemia, unspecified: Secondary | ICD-10-CM | POA: Insufficient documentation

## 2021-10-10 DIAGNOSIS — Z1211 Encounter for screening for malignant neoplasm of colon: Secondary | ICD-10-CM | POA: Diagnosis not present

## 2021-10-10 DIAGNOSIS — K5900 Constipation, unspecified: Secondary | ICD-10-CM | POA: Diagnosis not present

## 2021-10-10 DIAGNOSIS — I110 Hypertensive heart disease with heart failure: Secondary | ICD-10-CM | POA: Diagnosis not present

## 2021-10-10 DIAGNOSIS — I509 Heart failure, unspecified: Secondary | ICD-10-CM | POA: Diagnosis not present

## 2021-10-10 DIAGNOSIS — E755 Other lipid storage disorders: Secondary | ICD-10-CM | POA: Diagnosis not present

## 2021-10-10 DIAGNOSIS — E1165 Type 2 diabetes mellitus with hyperglycemia: Secondary | ICD-10-CM | POA: Diagnosis not present

## 2021-10-10 DIAGNOSIS — K760 Fatty (change of) liver, not elsewhere classified: Secondary | ICD-10-CM | POA: Insufficient documentation

## 2021-10-10 DIAGNOSIS — I5023 Acute on chronic systolic (congestive) heart failure: Secondary | ICD-10-CM | POA: Diagnosis not present

## 2021-10-10 DIAGNOSIS — Z95810 Presence of automatic (implantable) cardiac defibrillator: Secondary | ICD-10-CM | POA: Diagnosis not present

## 2021-10-10 HISTORY — PX: COLONOSCOPY WITH PROPOFOL: SHX5780

## 2021-10-10 HISTORY — PX: POLYPECTOMY: SHX5525

## 2021-10-10 SURGERY — COLONOSCOPY WITH PROPOFOL
Anesthesia: Monitor Anesthesia Care

## 2021-10-10 MED ORDER — LACTATED RINGERS IV SOLN: INTRAVENOUS | Status: DC

## 2021-10-10 MED ORDER — SODIUM CHLORIDE 0.9 % IV SOLN: INTRAVENOUS | Status: DC

## 2021-10-10 MED ORDER — PROPOFOL 500 MG/50ML IV EMUL
INTRAVENOUS | Status: DC | PRN
  Administered 2021-10-10: 150 ug/kg/min via INTRAVENOUS

## 2021-10-10 MED ORDER — PHENYLEPHRINE 40 MCG/ML (10ML) SYRINGE FOR IV PUSH (FOR BLOOD PRESSURE SUPPORT)
PREFILLED_SYRINGE | INTRAVENOUS | Status: DC | PRN
  Administered 2021-10-10 (×2): 80 ug via INTRAVENOUS

## 2021-10-10 SURGICAL SUPPLY — 22 items

## 2021-10-10 NOTE — Transfer of Care (Signed)
Immediate Anesthesia Transfer of Care Note  Patient: Toni [name]  Procedure(s) Performed: COLONOSCOPY WITH PROPOFOL POLYPECTOMY  Patient Location: Endoscopy Unit  Anesthesia Type:MAC  Level of Consciousness: awake  Airway & Oxygen Therapy: Patient Spontanous Breathing and Patient connected to face mask oxygen  Post-op Assessment: Report given to RN and Post -op Vital signs reviewed and stable  Post vital signs: Reviewed and stable  Last Vitals:  Vitals Value Taken Time  BP    Temp    Pulse    Resp    SpO2      Last Pain:  Vitals:   10/10/21 0735  TempSrc: Oral  PainSc: 0-No pain         Complications: No notable events documented.

## 2021-10-10 NOTE — Op Note (Signed)
[facility] Patient Name: Toni [name] Procedure Date: 10/10/2021 MRN: [id number] Attending MD: [name] , MD Date of Birth: 01/14/1956 CSN: [id number] Age: 65 Admit Type: Outpatient Procedure:                Colonoscopy Indications:              Screening for colorectal malignant neoplasm, Last                            colonoscopy: 2012 Providers:                [name], MD, Dulcy Fanny, Benetta Spar,                            Technician Referring MD:             Sandria Senter Medicines:                Monitored Anesthesia Care Complications:            No immediate complications. Estimated blood loss:                            Minimal. Estimated Blood Loss:     Estimated blood loss was minimal. Procedure:                Pre-Anesthesia Assessment:                           - Prior to the procedure, a History and Physical                            was performed, and patient medications and                            allergies were reviewed. The patient's tolerance of                            previous anesthesia was also reviewed. The risks                            and benefits of the procedure and the sedation                            options and risks were discussed with the patient.                            All questions were answered, and informed consent                            was obtained. Prior Anticoagulants: The patient has                            taken no previous anticoagulant or antiplatelet                            agents except for aspirin. ASA Grade Assessment: IV                            -  A patient with severe systemic disease that is a                            constant threat to life. After reviewing the risks                            and benefits, the patient was deemed in                            satisfactory condition to undergo the procedure.                           After obtaining informed consent, the  colonoscope                            was passed under direct vision. Throughout the                            procedure, the patient's blood pressure, pulse, and                            oxygen saturations were monitored continuously. The                            PCF-HQ190L ([id number]) Olympus colonoscope was                            introduced through the anus and advanced to the the                            terminal ileum. The colonoscopy was performed                            without difficulty. The patient tolerated the                            procedure well. The quality of the bowel                            preparation was good. Scope In: 8:08:18 AM Scope Out: 8:21:23 AM Scope Withdrawal Time: 0 hours 9 minutes 56 seconds  Total Procedure Duration: 0 hours 13 minutes 5 seconds  Findings:      The perianal and digital rectal examinations were normal.      Two sessile polyps were found in the rectum. The polyps were 4 mm in       size. These polyps were removed with a cold biopsy forceps. Resection       and retrieval were complete.      A 3 mm polyp was found in the cecum. The polyp was sessile. The polyp       was removed with a cold biopsy forceps. Resection and retrieval were       complete.      The terminal ileum appeared normal.      The exam was otherwise without abnormality on direct and retroflexion  views. Impression:               - Two 4 mm polyps in the rectum, removed with a                            cold biopsy forceps. Resected and retrieved.                           - One 3 mm polyp in the cecum, removed with a cold                            biopsy forceps. Resected and retrieved.                           - The examined portion of the ileum was normal.                           - The examination was otherwise normal on direct                            and retroflexion views. Moderate Sedation:      Patient did not receive moderate  sedation for this procedure, but       instead received monitored anesthesia care. Recommendation:           - Discharge patient to home.                           - Resume regular diet.                           - Continue present medications.                           - Await pathology results.                           - Repeat colonoscopy for surveillance based on                            pathology results. Procedure Code(s):        --- Professional ---                           [phone], Colonoscopy, flexible; with biopsy, single                            or multiple Diagnosis Code(s):        --- Professional ---                           Z12.11, Encounter for screening for malignant                            neoplasm of colon                           K62.1,  Rectal polyp                           K63.5, Polyp of colon CPT copyright 2019 American Medical Association. All rights reserved. The codes documented in this report are preliminary and upon coder review may  be revised to meet current compliance requirements. [name], MD 10/10/2021 8:28:26 AM This report has been signed electronically. Number of Addenda: 0

## 2021-10-10 NOTE — Discharge Instructions (Signed)

## 2021-10-10 NOTE — Interval H&P Note (Signed)
History and Physical Interval Note: 65/female for screening colonoscopy.  10/10/2021 7:50 AM  Toni [name]  has presented today for colonoscopy, with the diagnosis of Z12.11 Screening.  The various methods of treatment have been discussed with the patient and family. After consideration of risks, benefits and other options for treatment, the patient has consented to  Procedure(s): COLONOSCOPY WITH PROPOFOL (N/A) as a surgical intervention.  The patient's history has been reviewed, patient examined, no change in status, stable for surgery.  I have reviewed the patient's chart and labs.  Questions were answered to the patient's satisfaction.     [name]

## 2021-10-10 NOTE — Anesthesia Procedure Notes (Signed)
Procedure Name: MAC Date/Time: 10/10/2021 8:08 AM Performed by: Lieutenant Diego, CRNA Pre-anesthesia Checklist: Patient identified, Suction available, Emergency Drugs available, Patient being monitored and Timeout performed Patient Re-evaluated:Patient Re-evaluated prior to induction Oxygen Delivery Method: Simple face mask Preoxygenation: Pre-oxygenation with 100% oxygen Induction Type: IV induction

## 2021-10-10 NOTE — Anesthesia Postprocedure Evaluation (Signed)
Anesthesia Post Note  Patient: Toni [name]  Procedure(s) Performed: COLONOSCOPY WITH PROPOFOL POLYPECTOMY     Patient location during evaluation: Endoscopy Anesthesia Type: MAC Level of consciousness: awake and alert Pain management: pain level controlled Vital Signs Assessment: post-procedure vital signs reviewed and stable Respiratory status: spontaneous breathing, nonlabored ventilation, respiratory function stable and patient connected to nasal cannula oxygen Cardiovascular status: blood pressure returned to baseline and stable Postop Assessment: no apparent nausea or vomiting Anesthetic complications: no   No notable events documented.  Last Vitals:  Vitals:   10/10/21 0840 10/10/21 0850  BP: (!) 97/59 (!) 111/57  Pulse: 78 71  Resp:    Temp:    SpO2: 95% 98%    Last Pain:  Vitals:   10/10/21 0850  TempSrc:   PainSc: 0-No pain                 Barnet Glasgow

## 2021-10-13 ENCOUNTER — Encounter (HOSPITAL_COMMUNITY): Payer: Self-pay | Admitting: Gastroenterology

## 2021-10-14 LAB — SURGICAL PATHOLOGY

## 2021-10-23 ENCOUNTER — Ambulatory Visit
Admission: RE | Admit: 2021-10-23 | Discharge: 2021-10-23 | Disposition: A | Discharge status: Home / Self Care | Payer: HMO | Source: Ambulatory Visit | Attending: Adult Health | Admitting: Adult Health

## 2021-10-23 ENCOUNTER — Other Ambulatory Visit: Payer: HMO

## 2021-10-23 DIAGNOSIS — Z853 Personal history of malignant neoplasm of breast: Secondary | ICD-10-CM | POA: Diagnosis not present

## 2021-10-23 DIAGNOSIS — E2839 Other primary ovarian failure: Secondary | ICD-10-CM

## 2021-10-23 DIAGNOSIS — Z780 Asymptomatic menopausal state: Secondary | ICD-10-CM | POA: Diagnosis not present

## 2021-10-23 DIAGNOSIS — Z170 Estrogen receptor positive status [ER+]: Secondary | ICD-10-CM

## 2021-10-23 DIAGNOSIS — R922 Inconclusive mammogram: Secondary | ICD-10-CM | POA: Diagnosis not present

## 2021-10-23 DIAGNOSIS — C50511 Malignant neoplasm of lower-outer quadrant of right female breast: Secondary | ICD-10-CM

## 2021-10-23 HISTORY — DX: Personal history of antineoplastic chemotherapy: Z92.21

## 2021-10-23 HISTORY — DX: Personal history of irradiation: Z92.3

## 2021-10-24 ENCOUNTER — Telehealth: Payer: Self-pay

## 2021-10-24 NOTE — Telephone Encounter (Signed)
Called and spoke with pt, per Mendel Ryder, bone density is normal.  Pt verbalized understanding and thanks.

## 2021-10-25 ENCOUNTER — Other Ambulatory Visit: Payer: Self-pay

## 2021-10-25 ENCOUNTER — Encounter (HOSPITAL_COMMUNITY): Payer: Self-pay | Admitting: Cardiology

## 2021-10-25 ENCOUNTER — Ambulatory Visit (HOSPITAL_COMMUNITY)
Admission: RE | Admit: 2021-10-25 | Discharge: 2021-10-25 | Disposition: A | Discharge status: Home / Self Care | Payer: HMO | Source: Ambulatory Visit | Attending: Cardiology | Admitting: Cardiology

## 2021-10-25 VITALS — BP 90/50 | HR 76 | Wt 141.2 lb

## 2021-10-25 DIAGNOSIS — E785 Hyperlipidemia, unspecified: Secondary | ICD-10-CM | POA: Insufficient documentation

## 2021-10-25 DIAGNOSIS — E119 Type 2 diabetes mellitus without complications: Secondary | ICD-10-CM | POA: Diagnosis not present

## 2021-10-25 DIAGNOSIS — Z853 Personal history of malignant neoplasm of breast: Secondary | ICD-10-CM | POA: Diagnosis not present

## 2021-10-25 DIAGNOSIS — Z95810 Presence of automatic (implantable) cardiac defibrillator: Secondary | ICD-10-CM | POA: Diagnosis not present

## 2021-10-25 DIAGNOSIS — I110 Hypertensive heart disease with heart failure: Secondary | ICD-10-CM | POA: Insufficient documentation

## 2021-10-25 DIAGNOSIS — Z79899 Other long term (current) drug therapy: Secondary | ICD-10-CM | POA: Insufficient documentation

## 2021-10-25 DIAGNOSIS — I5022 Chronic systolic (congestive) heart failure: Secondary | ICD-10-CM | POA: Diagnosis not present

## 2021-10-25 DIAGNOSIS — Z7984 Long term (current) use of oral hypoglycemic drugs: Secondary | ICD-10-CM | POA: Diagnosis not present

## 2021-10-25 DIAGNOSIS — Z7901 Long term (current) use of anticoagulants: Secondary | ICD-10-CM | POA: Diagnosis not present

## 2021-10-25 DIAGNOSIS — I428 Other cardiomyopathies: Secondary | ICD-10-CM | POA: Diagnosis not present

## 2021-10-25 LAB — DIGOXIN LEVEL: Digoxin Level: 0.7 ng/mL — ABNORMAL LOW (ref 0.8–2.0)

## 2021-10-25 LAB — BASIC METABOLIC PANEL
Anion gap: 8 (ref 5–15)
BUN: 15 mg/dL (ref 8–23)
CO2: 25 mmol/L (ref 22–32)
Calcium: 9.8 mg/dL (ref 8.9–10.3)
Chloride: 107 mmol/L (ref 98–111)
Creatinine, Ser: 0.8 mg/dL (ref 0.44–1.00)
GFR, Estimated: >60 mL/min (ref >60)
Glucose, Bld: 196 mg/dL — ABNORMAL HIGH (ref 70–99)
Potassium: 4 mmol/L (ref 3.5–5.1)
Sodium: 140 mmol/L (ref 135–145)

## 2021-10-25 NOTE — Progress Notes (Signed)
PCP: Dr. Nancy Fetter Cardiology: Dr. Tamala Julian HF Cardiology: Dr. Aundra Dubin Oncology: Dr. Lindi Adie  Toni [name] is a 65 y.o. [employer] employee with a history of chronic systolic heart failure, due to nonischemic cardiomyopathy, HTN, DM, and hyperlipidemia.    Initially diagnosed with NICM in 1998 thought to be from HTN versus viral. Had cath in 1998 that was negative for coronary disease. EF at that time was 20% but EF recovered in 2012.    In October 2019, she had a cough/virus and she took OTC meds. Says she would feel better for a little while but then felt bad again. 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  She presented to [facility] ED on 11/13/18 with increased shortness of breath. She was admitted and echo was completed showing EF had gone back down to 15%. She had RHC/LHC with nonobstructive CAD and relatively preserved cardiac output. She was diuresed in the hospital and discharged. CPX in 2/20 showed only mild HF limitation.   Given family history of cardiomyopathy, I sent genetic testing.  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  I had her see Dr. Broadus John, we think that the variant may be benign and unrelated to her cardiomyopathy.  She has no history of conduction disturbance.   Echo in 6/20 showed that EF remains low at 25-30%, mild LV dilation, mildly decreased RV systolic function.  She had a [place] placed.   8/21 diagnosed with right breast cancer, ER+/PR-/HER2+.  She had right lumpectomy.   Echo in 10/21 showed EF 40% with mildly decreased RV systolic function. She has been getting Taxol + Herceptin.  Repeat echo in 11/21 showed EF 30% with mild LV dilation, mildly decreased RV systolic function. Herceptin was stopped and it was decided that she would be a poor candidate for it in the future.   Echo in 1/22 showed EF 25-30%, diffuse hypokinesis, mildly  decreased RV systolic function.  CPX in 3/22 showed mild HF limitation.   She returns for followup of CHF.  She has been doing well, no significant exertional dyspnea.  No problems walking up stairs.  No chest pain.  No significant lightheadedness.  Weight is down 2 lbs.   [employer] device interrogation: Heartlogic score 3, no VT    ECG (personally reviewed): NSR, nonspecific T wave changes  Labs (1/20): K 4, creatinine 0.67 => 0.74 Labs (3/20): K 4.1, creatinine 0.87 Labs (6/20): digoxin level 0.9 Labs (7/20): K 4.1, creatinine 0.74 Labs (8/20): digoxin 0.4 Labs (11/20): K 4.5, creatinine 0.65, digoxin 1.2 Labs (2/21): digoxin 0.4, K 4.2, creatinine 0.9 Labs (6/21): K 5.1, creatinine 0.83 Labs (9/21): K 4.6, creatinine 0.89 Labs (11/21): K 4, creatinine 0.78 Labs (1/22): K 4.4, creatinine 0.89 Labs (3/22): K 4.2, creatinine 0.72 Labs (7/22): K 4.8, creatinine 0.92, LDL 63 Labs (8/22): digoxin 0.3 Labs (11/22): K 4.9, creatinine 0.83  PMH: 1. HTN 2. Type 2 diabetes 3. Hyperlipidemia 4. Chronic systolic CHF: Nonischemic cardiomyopathy.  Diagnosed in 1998, EF 20% by echo at that time.  Echo back to normal range by 2012.   - LHC/RHC (12/19): D1 60-70% stenosis; mean RA 6, PA 58/22, mean PCWP 22, CI 2.9.  - Echo (12/19): EF 15% with severe LV dilation, moderate central MR likely functional.  - Cardiac MRI (12/19): Moderate LV dilation with EF 14%, mild RV dilation with EF 17%, LGE  at the inferior RV insertion site (nonspecific).  - CPX (2/20): peak VO2 18.6, VE/VCO2 31, RER 1.18 => mild HF limitation.  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  Suspect the variant is benign.  - Echo (6/20): EF 25-30%, mild LV dilation, mildly decreased RV systolic function.  - Echo (10/21): EF 40%, diffuse hypokinesis, mildly decreased RV systolic function.  - Echo (11/21): EF 30%, diffuse hypokinesis, mildly decreased RV systolic  function.  - Echo (1/22): EF 25-30%, diffuse hypokinesis, mildly decreased RV systolic function.  - CPX (3/22): Peak VO2 22, VE/VCO2 slope 33, RER 1.13.  Mild HF limitation.  5. Angioedema with ACEI 6. Left shoulder adhesive capsulitis 7. Depression 8. Breast cancer: 8/21 diagnosed with right breast cancer, ER+/PR-/HER2+.  She had right lumpectomy.  Radiation ongoing.   Social History   Socioeconomic History   Marital status: Single    Spouse name: Not on file   Number of children: Not on file   Years of education: 16   Highest education level: Bachelor's degree (e.g., BA, AB, BS)  Occupational History   Occupation: Surveyor, quantity: [employer]  Tobacco Use   Smoking status: Never   Smokeless tobacco: Never  Vaping Use   Vaping Use: Never used  Substance and Sexual Activity   Alcohol use: Yes    Alcohol/week: 0.0 standard drinks    Comment: less than once a month   Drug use: No   Sexual activity: Never  Other Topics Concern   Not on file  Social History Narrative   Works at [employer].  Lives alone.     Social Determinants of Health   Financial Resource Strain: Not on file  Food Insecurity: No Food Insecurity   Worried About Charity fundraiser in the Last Year: Never true   Ran Out of Food in the Last Year: Never true  Transportation Needs: No Transportation Needs   Lack of Transportation (Medical): No   Lack of Transportation (Non-Medical): No  Physical Activity: Not on file  Stress: Not on file  Social Connections: Not on file  Intimate Partner Violence: Not At Risk   Fear of Current or Ex-Partner: No   Emotionally Abused: No   Physically Abused: No   Sexually Abused: No   Family History  Problem Relation Age of Onset   Diabetes Mellitus I Mother    Lung cancer Mother 9   Breast cancer Mother        dx. late 30s/early 33s   Diabetes Mellitus I Father    Sudden death Father 3   Prostate cancer Brother 64   Hypertension Brother     Hypertension Brother    Diabetes Mellitus I Brother    Benign prostatic hyperplasia Brother    Heart failure Paternal Uncle    Thyroid cancer Niece        dx. in her 76s   Breast cancer Cousin        dx. in her 49s, recurrence in her 10s (maternal first cousin)   Breast cancer Cousin        female dx. in his early 64s (paternal first cousin)   Cancer Cousin        dx. in his early 28s, unknown type (paternal first cousin)   Cancer Cousin 64       unknown type (paternal first cousin)   ROS: All systems reviewed and negative except as per HPI.   Current Outpatient  Medications  Medication Sig Dispense Refill   acetaminophen (TYLENOL) 500 MG tablet Take 1,000 mg by mouth every 6 (six) hours as needed for moderate pain or headache.     anastrozole (ARIMIDEX) 1 MG tablet Take 1 tablet (1 mg total) by mouth daily. 90 tablet 3   aspirin 81 MG chewable tablet Chew 1 tablet (81 mg total) by mouth daily. 30 tablet 0   carvedilol (COREG) 25 MG tablet TAKE ONE TABLET BY MOUTH TWICE DAILY with meals 180 tablet 0   cetirizine (ZYRTEC) 10 MG tablet Take 10 mg by mouth at bedtime.     Continuous Blood Gluc Receiver (FREESTYLE LIBRE 14 DAY READER) DEVI Apply topically as directed.     Continuous Blood Gluc Sensor (FREESTYLE LIBRE 14 DAY SENSOR) MISC Apply topically as directed.     digoxin (LANOXIN) 0.125 MG tablet TAKE 1/2 TABLET BY MOUTH DAILY 45 tablet 3   furosemide (LASIX) 20 MG tablet Take 20 mg by mouth daily as needed for fluid or edema.     glipiZIDE (GLUCOTROL XL) 10 MG 24 hr tablet Take 20 mg by mouth daily with breakfast.      hydrALAZINE (APRESOLINE) 50 MG tablet Take 1.5 tablets (75 mg total) by mouth 3 (three) times daily. 405 tablet 3   isosorbide dinitrate (ISORDIL) 20 MG tablet Take 2 tablets (40 mg total) by mouth 3 (three) times daily. 540 tablet 3   JARDIANCE 25 MG TABS tablet Take 25 mg by mouth daily.     losartan (COZAAR) 50 MG tablet Take 1 tablet (50 mg total) by mouth 2 (two)  times daily. 60 tablet 11   metFORMIN (GLUCOPHAGE) 1000 MG tablet Take 1,000 mg by mouth 2 (two) times daily with a meal.     Multiple Vitamin (MULTIVITAMIN WITH MINERALS) TABS tablet Take 1 tablet by mouth daily. Centrum Silver     Semaglutide, 1 MG/DOSE, (OZEMPIC, 1 MG/DOSE,) 2 MG/1.5ML SOPN Inject 1 mg as directed once a week.     simvastatin (ZOCOR) 20 MG tablet Take 20 mg by mouth at bedtime.      spironolactone (ALDACTONE) 25 MG tablet TAKE ONE TABLET BY MOUTH EVERY EVENING 90 tablet 0   TRUE METRIX BLOOD GLUCOSE TEST test strip 1 each by Other route as directed.   5   TRUEPLUS LANCETS 30G MISC 1 each by Other route as directed. Use as directed.  5   UNIFINE PENTIPS 32G X 4 MM MISC 1 each by Other route as directed.      Insulin Glargine (BASAGLAR KWIKPEN) 100 UNIT/ML Inject 5-20 Units into the skin at bedtime as needed (blood sugar 150 or above). (Patient not taking: Reported on 10/25/2021)     No current facility-administered medications for this encounter.   BP (!) 90/50   Pulse 76   Wt 64 kg (141 lb 3.2 oz)   SpO2 97%   BMI 23.50 kg/m  General: NAD Neck: No JVD, no thyromegaly or thyroid nodule.  Lungs: Clear to auscultation bilaterally with normal respiratory effort. CV: Nondisplaced PMI.  Heart regular S1/S2, no S3/S4, no murmur.  No peripheral edema.  No carotid bruit.  Normal pedal pulses.  Abdomen: Soft, nontender, no hepatosplenomegaly, no distention.  Skin: Intact without lesions or rashes.  Neurologic: Alert and oriented x 3.  Psych: Normal affect. Extremities: No clubbing or cyanosis.  HEENT: Normal.   Assessment/Plan: 1. Chronic systolic CHF: Nonischemic cardiomyopathy by 12/19 cath.  Cardiac MRI with LV EF 14%, RV EF 17%. No  definite evidence for myocarditis or infiltrative disease by delayed enhancement images.  Most likely cause of cardiomyopathy is familial versus prior viral myocarditis.  Brother also had a cardiomyopathy of uncertain etiology.  Genetic testing  was done, showing an LMNA gene variant of uncertain significance => she saw Dr. Broadus John, suspect benign/uninvolved variant (no conduction abnormality).  CPX in 2/20 showed only mild HF limitation.  Echo in 6/20 showed EF 25-30%. Echo in 10/21 showed EF up some to 40%.  She now has a [place].  She was started on Taxol/Herceptin chemotherapy for breast cancer and 11/21 echo showed fall in EF to 30%.  It was decided that she would not be a candidate for ongoing Herceptin and this medication was stopped.  Echo in 1/22 showed EF 25-30%, diffuse hypokinesis, mildly decreased RV systolic function.  CPX in 3/22 showed mild HF limitation.  On exam and by Heartlogic, she is not volume overloaded.  NYHA class I-II symptoms.  Narrow QRS, not CRT candidate. Weight down.   - Continue Coreg 25 mg bid.   - Continue losartan 50 mg bid.  She cannot take Entresto with history of ACEI angioedema.   - Continue Jardiance.   - She will continue to take Lasix only prn.   - Continue spironolactone 25 mg daily. BMET today.  - Continue digoxin, check level.  - Continue hydralazine 75 mg tid and isordil to 40 mg tid.  - I do not think she is symptomatic enough at this time for barostimulator activation therapy.  - Repeat echo with followup appt.  2. Type II diabetes: She is on Jardiance.  3. Breast cancer: HER2+. She started Herceptin and Taxol chemotherapy.  EF fell to 30% and Herceptin was stopped.  I do not think Herceptin will be a good option for her going forwards.    Followup in 4 months with echo.    Loralie Champagne 10/25/2021

## 2021-10-25 NOTE — Patient Instructions (Signed)
It was great to see you today! No medication changes are needed at this time.  Labs today We will only contact you if something comes back abnormal or we need to make some changes. Otherwise no news is good news!  Your physician recommends that you schedule a follow-up appointment in: 4 months with Dr McLean/echo  Your physician has requested that you have an echocardiogram. Echocardiography is a painless test that uses sound waves to create images of your heart. It provides your doctor with information about the size and shape of your heart and how well your heart's chambers and valves are working. This procedure takes approximately one hour. There are no restrictions for this procedure.   Do the following things EVERYDAY: Weigh yourself in the morning before breakfast. Write it down and keep it in a log. Take your medicines as prescribed Eat low salt foods--Limit salt (sodium) to 2000 mg per day.  Stay as active as you can everyday Limit all fluids for the day to less than 2 liters  At the [facility], you and your health needs are our priority. As part of our continuing mission to provide you with exceptional heart care, we have created designated Provider Care Teams. These Care Teams include your primary Cardiologist (physician) and Advanced Practice Providers (APPs- Physician Assistants and Nurse Practitioners) who all work together to provide you with the care you need, when you need it.   You may see any of the following providers on your designated Care Team at your next follow up: Dr Glori Bickers Dr Haynes Kerns, NP Lyda Jester, [place] [facility] [place], [place] Audry Riles, PharmD   Please be sure to bring in all your medications bottles to every appointment.    If you have any questions or concerns before your next appointment please send [place] a message through [place] or call our office at [phone].    TO LEAVE A MESSAGE  FOR THE NURSE SELECT OPTION 2, PLEASE LEAVE A MESSAGE INCLUDING: YOUR NAME DATE OF BIRTH CALL BACK NUMBER REASON FOR CALL**this is important as we prioritize the call backs  YOU WILL RECEIVE A CALL BACK THE SAME DAY AS LONG AS YOU CALL BEFORE 4:00 PM.

## 2021-11-06 DIAGNOSIS — C50511 Malignant neoplasm of lower-outer quadrant of right female breast: Secondary | ICD-10-CM | POA: Diagnosis not present

## 2021-11-06 DIAGNOSIS — I10 Essential (primary) hypertension: Secondary | ICD-10-CM | POA: Diagnosis not present

## 2021-11-06 DIAGNOSIS — E1165 Type 2 diabetes mellitus with hyperglycemia: Secondary | ICD-10-CM | POA: Diagnosis not present

## 2021-11-06 DIAGNOSIS — E785 Hyperlipidemia, unspecified: Secondary | ICD-10-CM | POA: Diagnosis not present

## 2021-11-06 DIAGNOSIS — I509 Heart failure, unspecified: Secondary | ICD-10-CM | POA: Diagnosis not present

## 2021-11-06 DIAGNOSIS — E1169 Type 2 diabetes mellitus with other specified complication: Secondary | ICD-10-CM | POA: Diagnosis not present

## 2021-12-02 ENCOUNTER — Other Ambulatory Visit (HOSPITAL_COMMUNITY): Payer: Self-pay | Admitting: Cardiology

## 2021-12-02 ENCOUNTER — Other Ambulatory Visit: Payer: Self-pay | Admitting: Hematology and Oncology

## 2021-12-05 ENCOUNTER — Other Ambulatory Visit: Payer: Self-pay | Admitting: Cardiology

## 2021-12-07 ENCOUNTER — Other Ambulatory Visit (HOSPITAL_COMMUNITY): Payer: Self-pay | Admitting: Cardiology

## 2021-12-11 DIAGNOSIS — E1165 Type 2 diabetes mellitus with hyperglycemia: Secondary | ICD-10-CM | POA: Diagnosis not present

## 2021-12-11 DIAGNOSIS — Z7984 Long term (current) use of oral hypoglycemic drugs: Secondary | ICD-10-CM | POA: Diagnosis not present

## 2021-12-11 DIAGNOSIS — C50511 Malignant neoplasm of lower-outer quadrant of right female breast: Secondary | ICD-10-CM | POA: Diagnosis not present

## 2021-12-11 DIAGNOSIS — E785 Hyperlipidemia, unspecified: Secondary | ICD-10-CM | POA: Diagnosis not present

## 2021-12-11 DIAGNOSIS — I10 Essential (primary) hypertension: Secondary | ICD-10-CM | POA: Diagnosis not present

## 2021-12-11 DIAGNOSIS — I429 Cardiomyopathy, unspecified: Secondary | ICD-10-CM | POA: Diagnosis not present

## 2021-12-11 DIAGNOSIS — E1169 Type 2 diabetes mellitus with other specified complication: Secondary | ICD-10-CM | POA: Diagnosis not present

## 2021-12-11 DIAGNOSIS — I5022 Chronic systolic (congestive) heart failure: Secondary | ICD-10-CM | POA: Diagnosis not present

## 2021-12-11 DIAGNOSIS — Z1389 Encounter for screening for other disorder: Secondary | ICD-10-CM | POA: Diagnosis not present

## 2021-12-27 ENCOUNTER — Ambulatory Visit (INDEPENDENT_AMBULATORY_CARE_PROVIDER_SITE_OTHER): Payer: HMO

## 2021-12-27 ENCOUNTER — Encounter (HOSPITAL_COMMUNITY): Payer: Self-pay

## 2021-12-27 DIAGNOSIS — I428 Other cardiomyopathies: Secondary | ICD-10-CM | POA: Diagnosis not present

## 2021-12-27 LAB — CUP PACEART REMOTE DEVICE CHECK
Battery Remaining Longevity: 150 mo
Battery Remaining Percentage: 100 %
Brady Statistic RV Percent Paced: 0 %
Date Time Interrogation Session: 20230208050100
HighPow Impedance: 59 Ohm
Implantable Lead Implant Date: 20200730
Implantable Lead Location: 753860
Implantable Lead Model: 292
Implantable Lead Serial Number: 447014
Implantable Pulse Generator Implant Date: 20200730
Lead Channel Impedance Value: 420 Ohm
Lead Channel Pacing Threshold Amplitude: 1.6 V
Lead Channel Pacing Threshold Pulse Width: 0.4 ms
Lead Channel Setting Pacing Amplitude: 3.5 V
Lead Channel Setting Pacing Pulse Width: 0.4 ms
Lead Channel Setting Sensing Sensitivity: 0.5 mV
Pulse Gen Serial Number: 266091

## 2022-01-01 NOTE — Progress Notes (Signed)
Remote ICD transmission.   

## 2022-01-24 DIAGNOSIS — H40013 Open angle with borderline findings, low risk, bilateral: Secondary | ICD-10-CM | POA: Diagnosis not present

## 2022-01-24 DIAGNOSIS — E119 Type 2 diabetes mellitus without complications: Secondary | ICD-10-CM | POA: Diagnosis not present

## 2022-02-06 DIAGNOSIS — I10 Essential (primary) hypertension: Secondary | ICD-10-CM | POA: Diagnosis not present

## 2022-02-06 DIAGNOSIS — E785 Hyperlipidemia, unspecified: Secondary | ICD-10-CM | POA: Diagnosis not present

## 2022-02-06 DIAGNOSIS — E1165 Type 2 diabetes mellitus with hyperglycemia: Secondary | ICD-10-CM | POA: Diagnosis not present

## 2022-02-12 ENCOUNTER — Encounter (HOSPITAL_COMMUNITY): Payer: Self-pay | Admitting: Cardiology

## 2022-02-12 ENCOUNTER — Ambulatory Visit (HOSPITAL_COMMUNITY)
Admission: RE | Admit: 2022-02-12 | Discharge: 2022-02-12 | Disposition: A | Discharge status: Home / Self Care | Payer: HMO | Source: Ambulatory Visit | Attending: Cardiology | Admitting: Cardiology

## 2022-02-12 ENCOUNTER — Ambulatory Visit (HOSPITAL_BASED_OUTPATIENT_CLINIC_OR_DEPARTMENT_OTHER)
Admission: RE | Admit: 2022-02-12 | Discharge: 2022-02-12 | Disposition: A | Discharge status: Home / Self Care | Payer: HMO | Source: Ambulatory Visit | Attending: Cardiology | Admitting: Cardiology

## 2022-02-12 ENCOUNTER — Other Ambulatory Visit: Payer: Self-pay

## 2022-02-12 VITALS — BP 104/60 | HR 85 | Wt 144.4 lb

## 2022-02-12 DIAGNOSIS — I110 Hypertensive heart disease with heart failure: Secondary | ICD-10-CM | POA: Diagnosis not present

## 2022-02-12 DIAGNOSIS — Z8249 Family history of ischemic heart disease and other diseases of the circulatory system: Secondary | ICD-10-CM | POA: Insufficient documentation

## 2022-02-12 DIAGNOSIS — Z803 Family history of malignant neoplasm of breast: Secondary | ICD-10-CM | POA: Insufficient documentation

## 2022-02-12 DIAGNOSIS — I340 Nonrheumatic mitral (valve) insufficiency: Secondary | ICD-10-CM | POA: Insufficient documentation

## 2022-02-12 DIAGNOSIS — I5022 Chronic systolic (congestive) heart failure: Secondary | ICD-10-CM | POA: Diagnosis not present

## 2022-02-12 DIAGNOSIS — C50911 Malignant neoplasm of unspecified site of right female breast: Secondary | ICD-10-CM | POA: Diagnosis not present

## 2022-02-12 DIAGNOSIS — E119 Type 2 diabetes mellitus without complications: Secondary | ICD-10-CM | POA: Diagnosis not present

## 2022-02-12 DIAGNOSIS — I358 Other nonrheumatic aortic valve disorders: Secondary | ICD-10-CM | POA: Diagnosis not present

## 2022-02-12 DIAGNOSIS — Z833 Family history of diabetes mellitus: Secondary | ICD-10-CM | POA: Insufficient documentation

## 2022-02-12 DIAGNOSIS — Z7984 Long term (current) use of oral hypoglycemic drugs: Secondary | ICD-10-CM | POA: Diagnosis not present

## 2022-02-12 DIAGNOSIS — Z79899 Other long term (current) drug therapy: Secondary | ICD-10-CM | POA: Diagnosis not present

## 2022-02-12 DIAGNOSIS — Z9221 Personal history of antineoplastic chemotherapy: Secondary | ICD-10-CM | POA: Diagnosis not present

## 2022-02-12 DIAGNOSIS — Z170 Estrogen receptor positive status [ER+]: Secondary | ICD-10-CM | POA: Insufficient documentation

## 2022-02-12 DIAGNOSIS — Z95810 Presence of automatic (implantable) cardiac defibrillator: Secondary | ICD-10-CM | POA: Diagnosis not present

## 2022-02-12 DIAGNOSIS — I428 Other cardiomyopathies: Secondary | ICD-10-CM | POA: Insufficient documentation

## 2022-02-12 DIAGNOSIS — Z923 Personal history of irradiation: Secondary | ICD-10-CM | POA: Insufficient documentation

## 2022-02-12 DIAGNOSIS — E785 Hyperlipidemia, unspecified: Secondary | ICD-10-CM | POA: Insufficient documentation

## 2022-02-12 LAB — ECHOCARDIOGRAM COMPLETE
AR max vel: 1.39 cm2
AV Area VTI: 1.07 cm2
AV Area mean vel: 1.22 cm2
AV Mean grad: 3 mmHg
AV Peak grad: 5.4 mmHg
Ao pk vel: 1.16 m/s
Area-P 1/2: 3.1 cm2
Calc EF: 21 %
S' Lateral: 4.8 cm
Single Plane A2C EF: 31.3 %
Single Plane A4C EF: 15.6 %

## 2022-02-12 LAB — DIGOXIN LEVEL: Digoxin Level: 0.5 ng/mL — ABNORMAL LOW (ref 0.8–2.0)

## 2022-02-12 LAB — BASIC METABOLIC PANEL
Anion gap: 7 (ref 5–15)
BUN: 16 mg/dL (ref 8–23)
CO2: 27 mmol/L (ref 22–32)
Calcium: 10.3 mg/dL (ref 8.9–10.3)
Chloride: 102 mmol/L (ref 98–111)
Creatinine, Ser: 0.79 mg/dL (ref 0.44–1.00)
GFR, Estimated: >60 mL/min (ref >60)
Glucose, Bld: 157 mg/dL — ABNORMAL HIGH (ref 70–99)
Potassium: 4.4 mmol/L (ref 3.5–5.1)
Sodium: 136 mmol/L (ref 135–145)

## 2022-02-12 NOTE — Patient Instructions (Signed)
Continue current medications ? ?Your physician has recommended that you have a cardiopulmonary stress test (CPX). CPX testing is a non-invasive measurement of heart and lung function. It replaces a traditional treadmill stress test. This type of test provides a tremendous amount of information that relates not only to your present condition but also for future outcomes. This test combines measurements of you ventilation, respiratory gas exchange in the lungs, electrocardiogram (EKG), blood pressure and physical response before, during, and following an exercise protocol. ? ?Your physician recommends that you schedule a follow-up appointment in: 4 months ? ?If you have any questions or concerns before your next appointment please send [place] a message through [place] or call our office at [phone].   ? ?TO LEAVE A MESSAGE FOR THE NURSE SELECT OPTION 2, PLEASE LEAVE A MESSAGE INCLUDING: ?YOUR NAME ?DATE OF BIRTH ?CALL BACK NUMBER ?REASON FOR CALL**this is important as we prioritize the call backs ? ?YOU WILL RECEIVE A CALL BACK THE SAME DAY AS LONG AS YOU CALL BEFORE 4:00 PM ? ?At the [facility], you and your health needs are our priority. As part of our continuing mission to provide you with exceptional heart care, we have created designated Provider Care Teams. These Care Teams include your primary Cardiologist (physician) and Advanced Practice Providers (APPs- Physician Assistants and Nurse Practitioners) who all work together to provide you with the care you need, when you need it.  ? ?You may see any of the following providers on your designated Care Team at your next follow up: ?Dr Glori Bickers ?Dr Loralie Champagne ?Darrick Grinder, NP ?Lyda Jester, PA ?Jessica Milford,NP ?Marlyce Huge, PA ?Audry Riles, PharmD ? ? ?Please be sure to bring in all your medications bottles to every appointment.  ? ? ?

## 2022-02-12 NOTE — Progress Notes (Signed)
?  Echocardiogram ?2D Echocardiogram has been performed. ? ?Toni [name] ?02/12/2022, 9:45 AM ?

## 2022-02-12 NOTE — Progress Notes (Signed)
PCP: Dr. Nancy Fetter ?Cardiology: Dr. Tamala Julian ?HF Cardiology: Dr. Aundra Dubin ?Oncology: Dr. Lindi Adie ? ?Toni [name] is a 66 y.o. [employer] employee with a history of chronic systolic heart failure, due to nonischemic cardiomyopathy, HTN, DM, and hyperlipidemia.  ?  ?Initially diagnosed with NICM in 1998 thought to be from HTN versus viral. Had cath in 1998 that was negative for coronary disease. EF at that time was 20% but EF recovered in 2012.  ?  ?In October 2019, she had a cough/virus and she took OTC meds. Says she would feel better for a little while but then felt bad again. 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  She presented to [facility] ED on 11/13/18 with increased shortness of breath. She was admitted and echo was completed showing EF had gone back down to 15%. She had RHC/LHC with nonobstructive CAD and relatively preserved cardiac output. She was diuresed in the hospital and discharged. CPX in 2/20 showed only mild HF limitation.  ? ?Given family history of cardiomyopathy, I sent genetic testing.  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  I had her see Dr. Broadus John, we think that the variant may be benign and unrelated to her cardiomyopathy.  She has no history of conduction disturbance.  ? ?Echo in 6/20 showed that EF remains low at 25-30%, mild LV dilation, mildly decreased RV systolic function.  She had a [place] placed.  ? ?8/21 diagnosed with right breast cancer, ER+/PR-/HER2+.  She had right lumpectomy.  ? ?Echo in 10/21 showed EF 40% with mildly decreased RV systolic function. She has been getting Taxol + Herceptin.  Repeat echo in 11/21 showed EF 30% with mild LV dilation, mildly decreased RV systolic function. Herceptin was stopped and it was decided that she would be a poor candidate for it in the future.  ? ?Echo in 1/22 showed EF 25-30%, diffuse hypokinesis, mildly  decreased RV systolic function.  CPX in 3/22 showed mild HF limitation.  ? ?Echo was done today and reviewed, EF 20-25%, mildly decreased RV systolic function, mild MR.  ? ?She returns for followup of CHF.  She has been doing well from a heart standpoint.  No significant dyspnea or chest pain.  No orthopnea/PND.  Main complaint is right hip pain and sciatica. She takes Lasix occasionally.  She gets lightheaded only if she bends over then stands up fast. Weight up 3 lbs.  ? ?Development worker, international aid: Heartlogic score 7, no VT   ? ?ECG (personally reviewed): NSR, normal ? ?Labs (1/20): K 4, creatinine 0.67 => 0.74 ?Labs (3/20): K 4.1, creatinine 0.87 ?Labs (6/20): digoxin level 0.9 ?Labs (7/20): K 4.1, creatinine 0.74 ?Labs (8/20): digoxin 0.4 ?Labs (11/20): K 4.5, creatinine 0.65, digoxin 1.2 ?Labs (2/21): digoxin 0.4, K 4.2, creatinine 0.9 ?Labs (6/21): K 5.1, creatinine 0.83 ?Labs (9/21): K 4.6, creatinine 0.89 ?Labs (11/21): K 4, creatinine 0.78 ?Labs (1/22): K 4.4, creatinine 0.89 ?Labs (3/22): K 4.2, creatinine 0.72 ?Labs (7/22): K 4.8, creatinine 0.92, LDL 63 ?Labs (8/22): digoxin 0.3 ?Labs (11/22): K 4.9, creatinine 0.83 ?Labs (12/22): K 4, creatinine 0.8, digoxin 0.7 ? ?PMH: ?1. HTN ?2. Type 2 diabetes ?3. Hyperlipidemia ?4. Chronic systolic CHF: Nonischemic cardiomyopathy.  Diagnosed in 1998, EF 20% by echo at that time.  Echo back to normal range by 2012.   ?- LHC/RHC (12/19): D1 60-70% stenosis; mean  RA 6, PA 58/22, mean PCWP 22, CI 2.9.  ?- Echo (12/19): EF 15% with severe LV dilation, moderate central MR likely functional.  ?- Cardiac MRI (12/19): Moderate LV dilation with EF 14%, mild RV dilation with EF 17%, LGE at the inferior RV insertion site (nonspecific).  ?- CPX (2/20): peak VO2 18.6, VE/VCO2 31, RER 1.18 => mild HF limitation.  ?- Genetic testing showed LMNA variant of uncertain significance: No history of conduction abnormalities.  Suspect the variant is benign.  ?- Echo (6/20):  EF 25-30%, mild LV dilation, mildly decreased RV systolic function.  ?- Echo (10/21): EF 40%, diffuse hypokinesis, mildly decreased RV systolic function.  ?- Echo (11/21): EF 30%, diffuse hypokinesis, mildly decreased RV systolic function.  ?- Echo (1/22): EF 25-30%, diffuse hypokinesis, mildly decreased RV systolic function.  ?- CPX (3/22): Peak VO2 22, VE/VCO2 slope 33, RER 1.13.  Mild HF limitation.  ?- Echo (3/23): EF 20-25%, mildly decreased RV systolic function, mild MR.  ?5. Angioedema with ACEI ?6. Left shoulder adhesive capsulitis ?7. Depression ?8. Breast cancer: 8/21 diagnosed with right breast cancer, ER+/PR-/HER2+.  She had right lumpectomy.  Radiation ongoing.  ? ?Social History  ? ?Socioeconomic History  ? Marital status: Single  ?  Spouse name: Not on file  ? Number of children: Not on file  ? Years of education: 61  ? Highest education level: Bachelor's degree (e.g., BA, AB, BS)  ?Occupational History  ? Occupation: Development worker, community  ?  Employer: [employer]  ?Tobacco Use  ? Smoking status: Never  ? Smokeless tobacco: Never  ?Vaping Use  ? Vaping Use: Never used  ?Substance and Sexual Activity  ? Alcohol use: Yes  ?  Alcohol/week: 0.0 standard drinks  ?  Comment: less than once a month  ? Drug use: No  ? Sexual activity: Never  ?Other Topics Concern  ? Not on file  ?Social History Narrative  ? Works at [employer].  Lives alone.    ? ?Social Determinants of Health  ? ?Financial Resource Strain: Not on file  ?Food Insecurity: Not on file  ?Transportation Needs: Not on file  ?Physical Activity: Not on file  ?Stress: Not on file  ?Social Connections: Not on file  ?Intimate Partner Violence: Not on file  ? ?Family History  ?Problem Relation Age of Onset  ? Diabetes Mellitus I Mother   ? Lung cancer Mother 30  ? Breast cancer Mother   ?     dx. late 30s/early 18s  ? Diabetes Mellitus I Father   ? Sudden death Father 6  ? Prostate cancer Brother 91  ? Hypertension Brother   ? Hypertension Brother   ?  Diabetes Mellitus I Brother   ? Benign prostatic hyperplasia Brother   ? Heart failure Paternal Uncle   ? Thyroid cancer Niece   ?     dx. in her 18s  ? Breast cancer Cousin   ?     dx. in her 58s, recurrence in her 1s (maternal first cousin)  ? Breast cancer Cousin   ?     female dx. in his early 85s (paternal first cousin)  ? Cancer Cousin   ?     dx. in his early 36s, unknown type (paternal first cousin)  ? Cancer Cousin 12  ?     unknown type (paternal first cousin)  ? ?ROS: All systems reviewed and negative except as per HPI.  ? ?Current Outpatient Medications  ?Medication Sig Dispense Refill  ?  acetaminophen (TYLENOL) 500 MG tablet Take 1,000 mg by mouth every 6 (six) hours as needed for moderate pain or headache.    ? anastrozole (ARIMIDEX) 1 MG tablet TAKE ONE TABLET BY MOUTH DAILY 90 tablet 3  ? aspirin 81 MG chewable tablet Chew 1 tablet (81 mg total) by mouth daily. 30 tablet 0  ? carvedilol (COREG) 25 MG tablet TAKE ONE TABLET BY MOUTH TWICE DAILY with meals 180 tablet 3  ? cetirizine (ZYRTEC) 10 MG tablet Take 10 mg by mouth at bedtime.    ? Continuous Blood Gluc Receiver (FREESTYLE LIBRE 14 DAY READER) DEVI Apply topically as directed.    ? digoxin (LANOXIN) 0.125 MG tablet TAKE 1/2 TABLET BY MOUTH DAILY 45 tablet 3  ? furosemide (LASIX) 20 MG tablet Take 20 mg by mouth daily as needed for fluid or edema.    ? glipiZIDE (GLUCOTROL XL) 10 MG 24 hr tablet Take 20 mg by mouth daily with breakfast.     ? hydrALAZINE (APRESOLINE) 50 MG tablet Take 1.5 tablets (75 mg total) by mouth 3 (three) times daily. 405 tablet 3  ? isosorbide dinitrate (ISORDIL) 20 MG tablet Take 2 tablets (40 mg total) by mouth 3 (three) times daily. 540 tablet 3  ? JARDIANCE 25 MG TABS tablet Take 25 mg by mouth daily.    ? losartan (COZAAR) 50 MG tablet TAKE ONE TABLET BY MOUTH TWICE DAILY 60 tablet 11  ? metFORMIN (GLUCOPHAGE) 1000 MG tablet Take 1,000 mg by mouth 2 (two) times daily with a meal.    ? Multiple Vitamin (MULTIVITAMIN  WITH MINERALS) TABS tablet Take 1 tablet by mouth daily. Centrum Silver    ? Semaglutide, 1 MG/DOSE, (OZEMPIC, 1 MG/DOSE,) 2 MG/1.5ML SOPN Inject 1 mg as directed once a week.    ? simvastatin (ZOCOR) 20 MG t

## 2022-02-27 ENCOUNTER — Encounter (HOSPITAL_COMMUNITY): Payer: HMO

## 2022-03-03 ENCOUNTER — Other Ambulatory Visit (HOSPITAL_COMMUNITY): Payer: Self-pay | Admitting: Cardiology

## 2022-03-19 ENCOUNTER — Telehealth (HOSPITAL_COMMUNITY): Payer: Self-pay | Admitting: *Deleted

## 2022-03-19 DIAGNOSIS — G5701 Lesion of sciatic nerve, right lower limb: Secondary | ICD-10-CM | POA: Diagnosis not present

## 2022-03-19 DIAGNOSIS — M545 Low back pain, unspecified: Secondary | ICD-10-CM | POA: Diagnosis not present

## 2022-03-19 NOTE — Telephone Encounter (Signed)
Pt left vm cancelling hr cpx because of her sciatica. Pt said she just can not do this test. Pt sid her pain is so bad in the morning she can't walk. Pt asked if there was another test that could be performed.  ? ?Routed to [place] ?

## 2022-03-19 NOTE — Telephone Encounter (Signed)
Can wait until sciatica is better then try to do it.  ?

## 2022-03-20 ENCOUNTER — Encounter (HOSPITAL_COMMUNITY): Payer: HMO

## 2022-03-21 ENCOUNTER — Encounter (HOSPITAL_COMMUNITY): Payer: HMO

## 2022-03-29 ENCOUNTER — Telehealth: Payer: Self-pay | Admitting: Internal Medicine

## 2022-03-29 NOTE — Telephone Encounter (Signed)
Patient said she is out of town until 04/09/22 and will send a remote transmission when she gets home  ?

## 2022-04-02 NOTE — Telephone Encounter (Signed)
Remote was cancelled. ?

## 2022-04-06 DIAGNOSIS — I10 Essential (primary) hypertension: Secondary | ICD-10-CM | POA: Diagnosis not present

## 2022-04-06 DIAGNOSIS — E1165 Type 2 diabetes mellitus with hyperglycemia: Secondary | ICD-10-CM | POA: Diagnosis not present

## 2022-04-06 DIAGNOSIS — E785 Hyperlipidemia, unspecified: Secondary | ICD-10-CM | POA: Diagnosis not present

## 2022-04-06 DIAGNOSIS — I5022 Chronic systolic (congestive) heart failure: Secondary | ICD-10-CM | POA: Diagnosis not present

## 2022-04-09 ENCOUNTER — Ambulatory Visit (INDEPENDENT_AMBULATORY_CARE_PROVIDER_SITE_OTHER): Payer: HMO

## 2022-04-09 DIAGNOSIS — I428 Other cardiomyopathies: Secondary | ICD-10-CM

## 2022-04-10 LAB — CUP PACEART REMOTE DEVICE CHECK
Battery Remaining Longevity: 162 mo
Battery Remaining Percentage: 100 %
Brady Statistic RV Percent Paced: 0 %
Date Time Interrogation Session: 20230522123700
HighPow Impedance: 73 Ohm
Implantable Lead Implant Date: 20200730
Implantable Lead Location: 753860
Implantable Lead Model: 292
Implantable Lead Serial Number: 447014
Implantable Pulse Generator Implant Date: 20200730
Lead Channel Impedance Value: 446 Ohm
Lead Channel Pacing Threshold Amplitude: 1.4 V
Lead Channel Pacing Threshold Pulse Width: 0.4 ms
Lead Channel Setting Pacing Amplitude: 3.5 V
Lead Channel Setting Pacing Pulse Width: 0.4 ms
Lead Channel Setting Sensing Sensitivity: 0.5 mV
Pulse Gen Serial Number: 266091

## 2022-04-25 NOTE — Progress Notes (Signed)
Remote ICD transmission.   

## 2022-05-16 DIAGNOSIS — K2970 Gastritis, unspecified, without bleeding: Secondary | ICD-10-CM | POA: Diagnosis not present

## 2022-05-16 DIAGNOSIS — M7918 Myalgia, other site: Secondary | ICD-10-CM | POA: Diagnosis not present

## 2022-05-29 ENCOUNTER — Ambulatory Visit: Payer: HMO | Admitting: Orthopaedic Surgery

## 2022-05-29 ENCOUNTER — Ambulatory Visit (INDEPENDENT_AMBULATORY_CARE_PROVIDER_SITE_OTHER): Payer: HMO

## 2022-05-29 ENCOUNTER — Encounter: Payer: Self-pay | Admitting: Orthopaedic Surgery

## 2022-05-29 DIAGNOSIS — M79604 Pain in right leg: Secondary | ICD-10-CM

## 2022-05-29 DIAGNOSIS — M5440 Lumbago with sciatica, unspecified side: Secondary | ICD-10-CM | POA: Diagnosis not present

## 2022-05-29 DIAGNOSIS — M5416 Radiculopathy, lumbar region: Secondary | ICD-10-CM

## 2022-05-29 DIAGNOSIS — M545 Low back pain, unspecified: Secondary | ICD-10-CM | POA: Insufficient documentation

## 2022-05-29 NOTE — Progress Notes (Signed)
Office Visit Note   Patient: Toni [name]           Date of Birth: 06/05/1956           MRN: [id number] Visit Date: 05/29/2022              Requested by: Donald Prose, [place],  [place] [id number] PCP: Donald Prose, MD  Chief Complaint  Patient presents with   Lower Back - Follow-up      HPI: Patient is a pleasant 66 year old woman with a 26-monthhistory of right posterior buttock pain that radiates down her leg.  She denies any specific back pain.  She denies any paresthesias or numbness.  The pain initially was radiating down her lateral leg but at times goes all the way down to her foot.  She had been followed by her primary care provider for this.  She was given exercises to do which she has been very faithful to the last few months.  She did get good relief with Advil and Aleve but unfortunately started having GI stomach upset from it and can only take it with medication to help with this.  She is also an insulin-dependent diabetic and is concerned about staying on anti-inflammatories for a long time.  No loss of bowel or bladder control  Assessment & Plan: Visit Diagnoses:  1. Pain in right leg   2. Radiculopathy, lumbar region   3. Acute right-sided low back pain with sciatica, sciatica laterality unspecified     Plan: Pleasant 66year old woman with 364-monthistory of insidious onset of posterior buttock pain on the right radiating down to her knee and sometimes beyond.  She has failed conservative treatment including an exercise program as well as anti-inflammatories which helped but she is having GI issues and does not want to stay on these because of her history of diabetes for long periods of time when she does not take it her symptoms return.  It is also been 3 months since onset of symptoms.  Would recommend an MRI of the lumbar spine and follow-up afterwards for review  Follow-Up Instructions: No follow-ups on file.   Ortho Exam  Patient  is alert, oriented, no adenopathy, well-dressed, normal affect, normal respiratory effort. Examination of her lower back she has no pain specifically with bending forward extension side to side.  She has focal pain in the posterior buttock that does radiate down the side of her leg to her knee.  Sensation is intact.  Strength is 5 out of 5 with resisted dorsiflexion plantarflexion of her ankles flexion extension of her legs.  She has no pain with rotation of her hip  Imaging: No results found. No images are attached to the encounter.  Labs: Lab Results  Component Value Date   HGBA1C 7.6 (H) 01/27/2021   HGBA1C 9.3 (H) 08/04/2020   HGBA1C 6.9 (H) 11/14/2018     Lab Results  Component Value Date   ALBUMIN 3.9 11/24/2020   ALBUMIN 3.8 11/17/2020   ALBUMIN 3.7 11/10/2020    Lab Results  Component Value Date   MG 1.9 11/19/2018   MG 1.7 11/18/2018   MG 1.9 11/15/2018   No results found for: "VD25OH"  No results found for: "PREALBUMIN"    Latest Ref Rng & Units 01/27/2021    9:30 AM 11/24/2020   11:26 AM 11/17/2020    1:43 PM  CBC EXTENDED  WBC 4.0 - 10.5 K/uL 5.5  4.5  4.9   RBC 3.87 - 5.11 MIL/uL 4.70  4.36  4.47   Hemoglobin 12.0 - 15.0 g/dL 14.1  13.0  13.3   HCT 36.0 - 46.0 % 44.9  39.5  41.8   Platelets 150 - 400 K/uL 265  320  372   NEUT# 1.7 - 7.7 K/uL  2.3  2.7   Lymph# 0.7 - 4.0 K/uL  1.8  1.7      There is no height or weight on file to calculate BMI.  Orders:  Orders Placed This Encounter  Procedures   XR Lumbar Spine 2-3 Views   MR Lumbar Spine w/o contrast   No orders of the defined types were placed in this encounter.    Procedures: No procedures performed  Clinical Data: No additional findings.  ROS:  All other systems negative, except as noted in the HPI. Review of Systems  Objective: Vital Signs: There were no vitals taken for this visit.  Specialty Comments:  No specialty comments available.  PMFS History: Patient Active Problem  List   Diagnosis Date Noted   Low back pain 05/29/2022   Encounter for antineoplastic chemotherapy 09/15/2020   Port-A-Cath in place 09/08/2020   Genetic testing 07/28/2020   Family history of breast cancer    Family history of prostate cancer    Family history of thyroid cancer    Malignant neoplasm of lower-outer quadrant of right breast of female, estrogen receptor positive ([facility]) 07/01/2020   ICD (implantable cardioverter-defibrillator) in place 09/18/2019   Nonischemic cardiomyopathy ([facility]) 06/04/2019   Hypomagnesemia 11/18/2018   Hypokalemia 11/15/2018   Diabetes mellitus without complication ([name])    Coronary artery disease    Acute on chronic systolic CHF (congestive heart failure) ([facility]) 01/04/2016   Hyperlipidemia 01/03/2016   Cardiomegaly - hypertensive 01/03/2016   DM (diabetes mellitus), type 2 with complications ([facility]) 80/99/8338   Hypertension 01/03/2014   Past Medical History:  Diagnosis Date   AICD (automatic cardioverter/defibrillator) present    Breast cancer ([facility]) 2021   right breast   Cancer ([facility])    Right sided breast cancer   CHF (congestive heart failure) ([facility])    Diabetes mellitus without complication ([facility])    type 2    Family history of breast cancer    Family history of prostate cancer    Family history of thyroid cancer    Hypertension    Personal history of chemotherapy    Personal history of radiation therapy     Family History  Problem Relation Age of Onset   Diabetes Mellitus I Mother    Lung cancer Mother 41   Breast cancer Mother        dx. late 30s/early 48s   Diabetes Mellitus I Father    Sudden death Father 37   Prostate cancer Brother 33   Hypertension Brother    Hypertension Brother    Diabetes Mellitus I Brother    Benign prostatic hyperplasia Brother    Heart failure Paternal Uncle    Thyroid cancer Niece        dx. in her 66s   Breast cancer Cousin        dx. in her 71s, recurrence in her 34s (maternal first cousin)    Breast cancer Cousin        female dx. in his early 65s (paternal first cousin)   Cancer Cousin        dx. in his early 44s, unknown type (paternal first cousin)   Cancer Cousin 49  unknown type (paternal first cousin)    Past Surgical History:  Procedure Laterality Date   ABDOMINAL HYSTERECTOMY     BREAST LUMPECTOMY WITH RADIOACTIVE SEED AND SENTINEL LYMPH NODE BIOPSY Right 08/11/2020   Procedure: RIGHT BREAST LUMPECTOMY WITH RADIOACTIVE SEED AND SENTINEL LYMPH NODE BIOPSY;  Surgeon: Coralie Keens, MD;  Location: [facility];  Service: General;  Laterality: Right;   CARDIAC CATHETERIZATION     in [facility], clean, per pt.   COLONOSCOPY WITH PROPOFOL N/A 10/10/2021   Procedure: COLONOSCOPY WITH PROPOFOL;  Surgeon: [name], MD;  Location: [facility];  Service: Gastroenterology;  Laterality: N/A;   ICD IMPLANT N/A 06/18/2019   Procedure: ICD IMPLANT;  Surgeon: Evans Lance, MD;  Location: [facility];  Service: Cardiovascular;  Laterality: N/A;   POLYPECTOMY  10/10/2021   Procedure: POLYPECTOMY;  Surgeon: [name], MD;  Location: [facility];  Service: Gastroenterology;;   [facility] REMOVAL N/A 01/31/2021   Procedure: REMOVAL PORT-A-CATH;  Surgeon: Coralie Keens, MD;  Location: [facility];  Service: General;  Laterality: N/A;   PORTACATH PLACEMENT N/A 08/31/2020   Procedure: INSERTION PORT-A-CATH WITH ULTRASOUND GUIDANCE;  Surgeon: Coralie Keens, MD;  Location: [facility];  Service: General;  Laterality: N/A;   RIGHT/LEFT HEART CATH AND CORONARY ANGIOGRAPHY N/A 11/17/2018   Procedure: RIGHT/LEFT HEART CATH AND CORONARY ANGIOGRAPHY;  Surgeon: Belva Crome, MD;  Location: [facility];  Service: Cardiovascular;  Laterality: N/A;   Social History   Occupational History   Occupation: Surveyor, quantity: [employer]  Tobacco Use   Smoking status: Never   Smokeless tobacco: Never  Vaping Use   Vaping Use: Never used  Substance and Sexual Activity    Alcohol use: Yes    Alcohol/week: 0.0 standard drinks of alcohol    Comment: less than once a month   Drug use: No   Sexual activity: Never

## 2022-05-29 NOTE — Progress Notes (Signed)
PCP: Dr. Nancy Fetter Cardiology: Dr. Tamala Julian HF Cardiology: Dr. Aundra Dubin Oncology: Dr. Lindi Adie  Toni [name] is a 66 y.o. [employer] employee with a history of chronic systolic heart failure, due to nonischemic cardiomyopathy, HTN, DM, and hyperlipidemia.    Initially diagnosed with NICM in 1998 thought to be from HTN versus viral. Had cath in 1998 that was negative for coronary disease. EF at that time was 20% but EF recovered in 2012.    In October 2019, she had a cough/virus and she took OTC meds. Says she would feel better for a little while but then felt bad again. 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  She presented to [facility] ED on 11/13/18 with increased shortness of breath. She was admitted and echo was completed showing EF had gone back down to 15%. She had RHC/LHC with nonobstructive CAD and relatively preserved cardiac output. She was diuresed in the hospital and discharged. CPX in 2/20 showed only mild HF limitation.   Given family history of cardiomyopathy, I sent genetic testing.  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  I had her see Dr. Broadus John, we think that the variant may be benign and unrelated to her cardiomyopathy.  She has no history of conduction disturbance.   Echo in 6/20 showed that EF remains low at 25-30%, mild LV dilation, mildly decreased RV systolic function.  She had a [place] placed.   8/21 diagnosed with right breast cancer, ER+/PR-/HER2+.  She had right lumpectomy.   Echo in 10/21 showed EF 40% with mildly decreased RV systolic function. She has been getting Taxol + Herceptin.  Repeat echo in 11/21 showed EF 30% with mild LV dilation, mildly decreased RV systolic function. Herceptin was stopped and it was decided that she would be a poor candidate for it in the future.   Echo in 1/22 showed EF 25-30%, diffuse hypokinesis, mildly  decreased RV systolic function.  CPX in 3/22 showed mild HF limitation.   Echo was done today and reviewed, EF 20-25%, mildly decreased RV systolic function, mild MR.   She returns for followup of CHF.  She has been doing well from a heart standpoint.  No significant dyspnea or chest pain.  No orthopnea/PND.  Main complaint is right hip pain and sciatica. She takes Lasix occasionally.  She gets lightheaded only if she bends over then stands up fast. Weight up 3 lbs.   [employer] device interrogation: Heartlogic score 7, no VT    ECG (personally reviewed): NSR, normal  Labs (1/20): K 4, creatinine 0.67 => 0.74 Labs (3/20): K 4.1, creatinine 0.87 Labs (6/20): digoxin level 0.9 Labs (7/20): K 4.1, creatinine 0.74 Labs (8/20): digoxin 0.4 Labs (11/20): K 4.5, creatinine 0.65, digoxin 1.2 Labs (2/21): digoxin 0.4, K 4.2, creatinine 0.9 Labs (6/21): K 5.1, creatinine 0.83 Labs (9/21): K 4.6, creatinine 0.89 Labs (11/21): K 4, creatinine 0.78 Labs (1/22): K 4.4, creatinine 0.89 Labs (3/22): K 4.2, creatinine 0.72 Labs (7/22): K 4.8, creatinine 0.92, LDL 63 Labs (8/22): digoxin 0.3 Labs (11/22): K 4.9, creatinine 0.83 Labs (12/22): K 4, creatinine 0.8, digoxin 0.7  PMH: 1. HTN 2. Type 2 diabetes 3. Hyperlipidemia 4. Chronic systolic CHF: Nonischemic cardiomyopathy.  Diagnosed in 1998, EF 20% by echo at that time.  Echo back to normal range by 2012.   - LHC/RHC (12/19): D1 60-70% stenosis; mean  RA 6, PA 58/22, mean PCWP 22, CI 2.9.  - Echo (12/19): EF 15% with severe LV dilation, moderate central MR likely functional.  - Cardiac MRI (12/19): Moderate LV dilation with EF 14%, mild RV dilation with EF 17%, LGE at the inferior RV insertion site (nonspecific).  - CPX (2/20): peak VO2 18.6, VE/VCO2 31, RER 1.18 => mild HF limitation.  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  Suspect the variant is benign.  - Echo (6/20):  EF 25-30%, mild LV dilation, mildly decreased RV systolic function.  - Echo (10/21): EF 40%, diffuse hypokinesis, mildly decreased RV systolic function.  - Echo (11/21): EF 30%, diffuse hypokinesis, mildly decreased RV systolic function.  - Echo (1/22): EF 25-30%, diffuse hypokinesis, mildly decreased RV systolic function.  - CPX (3/22): Peak VO2 22, VE/VCO2 slope 33, RER 1.13.  Mild HF limitation.  - Echo (3/23): EF 20-25%, mildly decreased RV systolic function, mild MR.  5. Angioedema with ACEI 6. Left shoulder adhesive capsulitis 7. Depression 8. Breast cancer: 8/21 diagnosed with right breast cancer, ER+/PR-/HER2+.  She had right lumpectomy.  Radiation ongoing.   Social History   Socioeconomic History   Marital status: Single    Spouse name: Not on file   Number of children: Not on file   Years of education: 16   Highest education level: Bachelor's degree (e.g., BA, AB, BS)  Occupational History   Occupation: Surveyor, quantity: [employer]  Tobacco Use   Smoking status: Never   Smokeless tobacco: Never  Vaping Use   Vaping Use: Never used  Substance and Sexual Activity   Alcohol use: Yes    Alcohol/week: 0.0 standard drinks of alcohol    Comment: less than once a month   Drug use: No   Sexual activity: Never  Other Topics Concern   Not on file  Social History Narrative   Works at [employer].  Lives alone.     Social Determinants of Health   Financial Resource Strain: Low Risk  (07/06/2020)   Overall Financial Resource Strain (CARDIA)    Difficulty of Paying Living Expenses: Not hard at all  Food Insecurity: No Food Insecurity (10/28/2020)   Hunger Vital Sign    Worried About Running Out of Food in the Last Year: Never true    Ran Out of Food in the Last Year: Never true  Transportation Needs: No Transportation Needs (10/28/2020)   PRAPARE - Hydrologist (Medical): No    Lack of Transportation (Non-Medical): No  Physical  Activity: Inactive (11/03/2019)   Exercise Vital Sign    Days of Exercise per Week: 0 days    Minutes of Exercise per Session: 0 min  Stress: Stress Concern Present (11/03/2019)   [place]    Feeling of Stress : Very much  Social Connections: Not on file  Intimate Partner Violence: Not At Risk (12/06/2020)   Humiliation, Afraid, Rape, and Kick questionnaire    Fear of Current or Ex-Partner: No    Emotionally Abused: No    Physically Abused: No    Sexually Abused: No   Family History  Problem Relation Age of Onset   Diabetes Mellitus I Mother    Lung cancer Mother 72   Breast cancer Mother        dx. late 30s/early 4s   Diabetes Mellitus I Father    Sudden death Father  1   Prostate cancer Brother 36   Hypertension Brother    Hypertension Brother    Diabetes Mellitus I Brother    Benign prostatic hyperplasia Brother    Heart failure Paternal Uncle    Thyroid cancer Niece        dx. in her 15s   Breast cancer Cousin        dx. in her 34s, recurrence in her 9s (maternal first cousin)   Breast cancer Cousin        female dx. in his early 31s (paternal first cousin)   Cancer Cousin        dx. in his early 60s, unknown type (paternal first cousin)   Cancer Cousin 29       unknown type (paternal first cousin)   ROS: All systems reviewed and negative except as per HPI.   Current Outpatient Medications  Medication Sig Dispense Refill   acetaminophen (TYLENOL) 500 MG tablet Take 1,000 mg by mouth every 6 (six) hours as needed for moderate pain or headache.     anastrozole (ARIMIDEX) 1 MG tablet TAKE ONE TABLET BY MOUTH DAILY 90 tablet 3   aspirin 81 MG chewable tablet Chew 1 tablet (81 mg total) by mouth daily. 30 tablet 0   carvedilol (COREG) 25 MG tablet TAKE ONE TABLET BY MOUTH TWICE DAILY with meals 180 tablet 3   cetirizine (ZYRTEC) 10 MG tablet Take 10 mg by mouth at bedtime.     Continuous Blood Gluc  Receiver (FREESTYLE LIBRE 14 DAY READER) DEVI Apply topically as directed.     digoxin (LANOXIN) 0.125 MG tablet TAKE 1/2 TABLET BY MOUTH ONCE DAILY 45 tablet 3   furosemide (LASIX) 20 MG tablet TAKE ONE TABLET BY MOUTH ONCE DAILY 90 tablet 3   glipiZIDE (GLUCOTROL XL) 10 MG 24 hr tablet Take 20 mg by mouth daily with breakfast.      hydrALAZINE (APRESOLINE) 50 MG tablet Take 1.5 tablets (75 mg total) by mouth 3 (three) times daily. 405 tablet 3   isosorbide dinitrate (ISORDIL) 20 MG tablet Take 2 tablets (40 mg total) by mouth 3 (three) times daily. 540 tablet 3   JARDIANCE 25 MG TABS tablet Take 25 mg by mouth daily.     losartan (COZAAR) 50 MG tablet TAKE ONE TABLET BY MOUTH TWICE DAILY 60 tablet 11   metFORMIN (GLUCOPHAGE) 1000 MG tablet Take 1,000 mg by mouth 2 (two) times daily with a meal.     Multiple Vitamin (MULTIVITAMIN WITH MINERALS) TABS tablet Take 1 tablet by mouth daily. Centrum Silver     Semaglutide, 1 MG/DOSE, (OZEMPIC, 1 MG/DOSE,) 2 MG/1.5ML SOPN Inject 1 mg as directed once a week.     simvastatin (ZOCOR) 20 MG tablet Take 20 mg by mouth at bedtime.      spironolactone (ALDACTONE) 25 MG tablet TAKE ONE TABLET BY MOUTH EVERY EVENING 90 tablet 3   TRUE METRIX BLOOD GLUCOSE TEST test strip 1 each by Other route as directed.   5   TRUEPLUS LANCETS 30G MISC 1 each by Other route as directed. Use as directed.  5   UNIFINE PENTIPS 32G X 4 MM MISC 1 each by Other route as directed.      No current facility-administered medications for this visit.   There were no vitals taken for this visit. General: NAD Neck: No JVD, no thyromegaly or thyroid nodule.  Lungs: Clear to auscultation bilaterally with normal respiratory effort. CV: Nondisplaced PMI.  Heart regular S1/S2,  no S3/S4, no murmur.  No peripheral edema.  No carotid bruit.  Normal pedal pulses.  Abdomen: Soft, nontender, no hepatosplenomegaly, no distention.  Skin: Intact without lesions or rashes.  Neurologic: Alert and  oriented x 3.  Psych: Normal affect. Extremities: No clubbing or cyanosis.  HEENT: Normal.   Assessment/Plan: 1. Chronic systolic CHF: Nonischemic cardiomyopathy by 12/19 cath.  Cardiac MRI with LV EF 14%, RV EF 17%. No definite evidence for myocarditis or infiltrative disease by delayed enhancement images.  Most likely cause of cardiomyopathy is familial versus prior viral myocarditis.  Brother also had a cardiomyopathy of uncertain etiology.  Genetic testing was done, showing an LMNA gene variant of uncertain significance => she saw Dr. Broadus John, suspect benign/uninvolved variant (no conduction abnormality).  CPX in 2/20 showed only mild HF limitation.  Echo in 6/20 showed EF 25-30%. Echo in 10/21 showed EF up some to 40%.  She now has a [place].  She was started on Taxol/Herceptin chemotherapy for breast cancer and 11/21 echo showed fall in EF to 30%.  It was decided that she would not be a candidate for ongoing Herceptin and this medication was stopped.  Echo in 1/22 showed EF 25-30%, diffuse hypokinesis, mildly decreased RV systolic function.  CPX in 3/22 showed mild HF limitation.  Echo today showed EF remains 20-25%.  On exam and by Heartlogic, she is not volume overloaded.  NYHA class I-II symptoms.  Narrow QRS, not CRT candidate.  - Continue Coreg 25 mg bid.   - Continue losartan 50 mg bid.  She cannot take Entresto with history of ACEI angioedema.  Will not increase with soft BP.  - Continue Jardiance.   - She will continue to take Lasix only prn.   - Continue spironolactone 25 mg daily. BMET today.  - Continue digoxin, check level.  - Continue hydralazine 75 mg tid and isordil to 40 mg tid.  - I do not think she is symptomatic enough at this time for barostimulator activation therapy.  - I will arrange for repeat CPX for objective assessment of functional capacity.  2. Type II diabetes: She is on Jardiance.  3. Breast cancer: HER2+. She started Herceptin and Taxol  chemotherapy.  EF fell to 30% and Herceptin was stopped.  I do not think Herceptin will be a good option for her going forwards.    Followup in 4 months with APP.   [place] 05/29/2022

## 2022-05-30 ENCOUNTER — Encounter (HOSPITAL_COMMUNITY): Payer: Self-pay

## 2022-05-30 ENCOUNTER — Telehealth: Payer: Self-pay | Admitting: Physician Assistant

## 2022-05-30 ENCOUNTER — Ambulatory Visit (HOSPITAL_COMMUNITY)
Admission: RE | Admit: 2022-05-30 | Discharge: 2022-05-30 | Disposition: A | Discharge status: Home / Self Care | Payer: HMO | Source: Ambulatory Visit | Attending: Family Medicine | Admitting: Family Medicine

## 2022-05-30 VITALS — BP 126/70 | HR 85 | Wt 136.4 lb

## 2022-05-30 DIAGNOSIS — Z7984 Long term (current) use of oral hypoglycemic drugs: Secondary | ICD-10-CM | POA: Diagnosis not present

## 2022-05-30 DIAGNOSIS — E119 Type 2 diabetes mellitus without complications: Secondary | ICD-10-CM | POA: Diagnosis not present

## 2022-05-30 DIAGNOSIS — Z79899 Other long term (current) drug therapy: Secondary | ICD-10-CM | POA: Insufficient documentation

## 2022-05-30 DIAGNOSIS — I428 Other cardiomyopathies: Secondary | ICD-10-CM | POA: Diagnosis not present

## 2022-05-30 DIAGNOSIS — E785 Hyperlipidemia, unspecified: Secondary | ICD-10-CM | POA: Insufficient documentation

## 2022-05-30 DIAGNOSIS — C50919 Malignant neoplasm of unspecified site of unspecified female breast: Secondary | ICD-10-CM | POA: Insufficient documentation

## 2022-05-30 DIAGNOSIS — Z95810 Presence of automatic (implantable) cardiac defibrillator: Secondary | ICD-10-CM | POA: Diagnosis not present

## 2022-05-30 DIAGNOSIS — I110 Hypertensive heart disease with heart failure: Secondary | ICD-10-CM | POA: Diagnosis not present

## 2022-05-30 DIAGNOSIS — Z170 Estrogen receptor positive status [ER+]: Secondary | ICD-10-CM | POA: Diagnosis not present

## 2022-05-30 DIAGNOSIS — I5022 Chronic systolic (congestive) heart failure: Secondary | ICD-10-CM | POA: Diagnosis not present

## 2022-05-30 DIAGNOSIS — M79604 Pain in right leg: Secondary | ICD-10-CM | POA: Insufficient documentation

## 2022-05-30 DIAGNOSIS — Z853 Personal history of malignant neoplasm of breast: Secondary | ICD-10-CM

## 2022-05-30 LAB — BASIC METABOLIC PANEL
Anion gap: 12 (ref 5–15)
BUN: 19 mg/dL (ref 8–23)
CO2: 24 mmol/L (ref 22–32)
Calcium: 9.9 mg/dL (ref 8.9–10.3)
Chloride: 103 mmol/L (ref 98–111)
Creatinine, Ser: 0.91 mg/dL (ref 0.44–1.00)
GFR, Estimated: >60 mL/min (ref >60)
Glucose, Bld: 135 mg/dL — ABNORMAL HIGH (ref 70–99)
Potassium: 4 mmol/L (ref 3.5–5.1)
Sodium: 139 mmol/L (ref 135–145)

## 2022-05-30 LAB — DIGOXIN LEVEL: Digoxin Level: 0.6 ng/mL — ABNORMAL LOW (ref 0.8–2.0)

## 2022-05-30 NOTE — Patient Instructions (Signed)
Good to see you today!  Labs done today, your results will be available in MyChart, we will contact you for abnormal readings.  Your physician recommends that you schedule a follow-up appointment in: 4 months with Dr. Aundra Dubin November 2023 Call office in September for appointment  Call office when ortho work up done to schedule an appointment for CPX test  At the [facility], you and your health needs are our priority. As part of our continuing mission to provide you with exceptional heart care, we have created designated Provider Care Teams. These Care Teams include your primary Cardiologist (physician) and Advanced Practice Providers (APPs- Physician Assistants and Nurse Practitioners) who all work together to provide you with the care you need, when you need it.   You may see any of the following providers on your designated Care Team at your next follow up: Dr Glori Bickers Dr Haynes Kerns, NP Lyda Jester, [place] [facility] [place], [place] Audry Riles, PharmD   Please be sure to bring in all your medications bottles to every appointment.   At the [facility], you and your health needs are our priority. As part of our continuing mission to provide you with exceptional heart care, we have created designated Provider Care Teams. These Care Teams include your primary Cardiologist (physician) and Advanced Practice Providers (APPs- Physician Assistants and Nurse Practitioners) who all work together to provide you with the care you need, when you need it.   You may see any of the following providers on your designated Care Team at your next follow up: Dr Glori Bickers Dr Haynes Kerns, NP Lyda Jester, [place] [facility] [place], [place] Audry Riles, PharmD   Please be sure to bring in all your medications bottles to every appointment.

## 2022-05-30 NOTE — Telephone Encounter (Signed)
Patient called in stating [facility] told her they could not do the MRI there because she had a Defibrillator please advise

## 2022-05-31 NOTE — Telephone Encounter (Signed)
Emailed AP with [facility] scheduling- they will contact pt to schedule appt

## 2022-06-01 ENCOUNTER — Other Ambulatory Visit (HOSPITAL_COMMUNITY): Payer: Self-pay | Admitting: Cardiology

## 2022-06-04 DIAGNOSIS — I10 Essential (primary) hypertension: Secondary | ICD-10-CM | POA: Diagnosis not present

## 2022-06-04 DIAGNOSIS — I5022 Chronic systolic (congestive) heart failure: Secondary | ICD-10-CM | POA: Diagnosis not present

## 2022-06-04 DIAGNOSIS — E785 Hyperlipidemia, unspecified: Secondary | ICD-10-CM | POA: Diagnosis not present

## 2022-06-04 DIAGNOSIS — E1165 Type 2 diabetes mellitus with hyperglycemia: Secondary | ICD-10-CM | POA: Diagnosis not present

## 2022-06-08 ENCOUNTER — Other Ambulatory Visit: Payer: Self-pay | Admitting: Physician Assistant

## 2022-06-08 DIAGNOSIS — M5416 Radiculopathy, lumbar region: Secondary | ICD-10-CM

## 2022-06-19 ENCOUNTER — Ambulatory Visit: Payer: PPO | Admitting: Physical Therapy

## 2022-06-26 DIAGNOSIS — I5022 Chronic systolic (congestive) heart failure: Secondary | ICD-10-CM | POA: Diagnosis not present

## 2022-06-26 DIAGNOSIS — E785 Hyperlipidemia, unspecified: Secondary | ICD-10-CM | POA: Diagnosis not present

## 2022-06-26 DIAGNOSIS — C50511 Malignant neoplasm of lower-outer quadrant of right female breast: Secondary | ICD-10-CM | POA: Diagnosis not present

## 2022-06-26 DIAGNOSIS — E1169 Type 2 diabetes mellitus with other specified complication: Secondary | ICD-10-CM | POA: Diagnosis not present

## 2022-06-26 DIAGNOSIS — Z23 Encounter for immunization: Secondary | ICD-10-CM | POA: Diagnosis not present

## 2022-06-26 DIAGNOSIS — Z0000 Encounter for general adult medical examination without abnormal findings: Secondary | ICD-10-CM | POA: Diagnosis not present

## 2022-06-26 DIAGNOSIS — Z1389 Encounter for screening for other disorder: Secondary | ICD-10-CM | POA: Diagnosis not present

## 2022-06-26 DIAGNOSIS — I429 Cardiomyopathy, unspecified: Secondary | ICD-10-CM | POA: Diagnosis not present

## 2022-06-26 DIAGNOSIS — I10 Essential (primary) hypertension: Secondary | ICD-10-CM | POA: Diagnosis not present

## 2022-07-09 ENCOUNTER — Ambulatory Visit (INDEPENDENT_AMBULATORY_CARE_PROVIDER_SITE_OTHER): Payer: HMO

## 2022-07-09 DIAGNOSIS — I428 Other cardiomyopathies: Secondary | ICD-10-CM

## 2022-07-09 DIAGNOSIS — I5022 Chronic systolic (congestive) heart failure: Secondary | ICD-10-CM | POA: Diagnosis not present

## 2022-07-11 LAB — CUP PACEART REMOTE DEVICE CHECK
Battery Remaining Longevity: 150 mo
Battery Remaining Percentage: 100 %
Brady Statistic RV Percent Paced: 0 %
Date Time Interrogation Session: 20230821222100
HighPow Impedance: 67 Ohm
Implantable Lead Implant Date: 20200730
Implantable Lead Location: 753860
Implantable Lead Model: 292
Implantable Lead Serial Number: 447014
Implantable Pulse Generator Implant Date: 20200730
Lead Channel Impedance Value: 415 Ohm
Lead Channel Pacing Threshold Amplitude: 1.4 V
Lead Channel Pacing Threshold Pulse Width: 0.4 ms
Lead Channel Setting Pacing Amplitude: 3.5 V
Lead Channel Setting Pacing Pulse Width: 0.4 ms
Lead Channel Setting Sensing Sensitivity: 0.5 mV
Pulse Gen Serial Number: 266091

## 2022-07-14 IMAGING — MG MM BREAST LOCALIZATION CLIP
4 series · 4 of 12 positions shown · non-contrast
Comparison: Previous exam(s).

CLINICAL DATA: Patient status post ultrasound-guided biopsy right
breast mass 6 o'clock position.

EXAM:
DIAGNOSTIC RIGHT MAMMOGRAM POST ULTRASOUND BIOPSY

[R CC synth-2D]
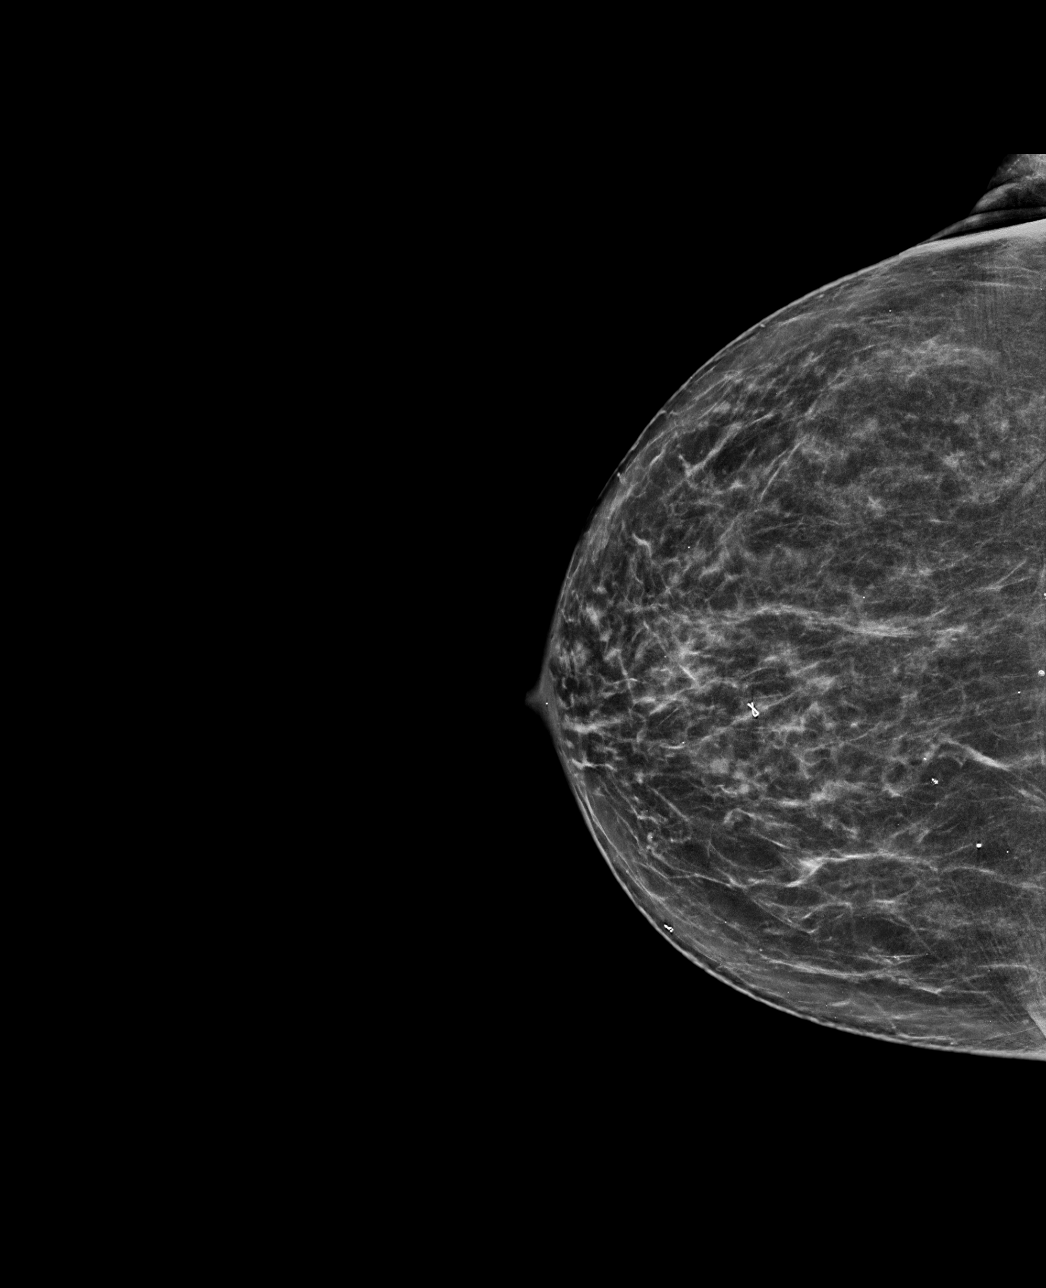

[R ML synth-2D]
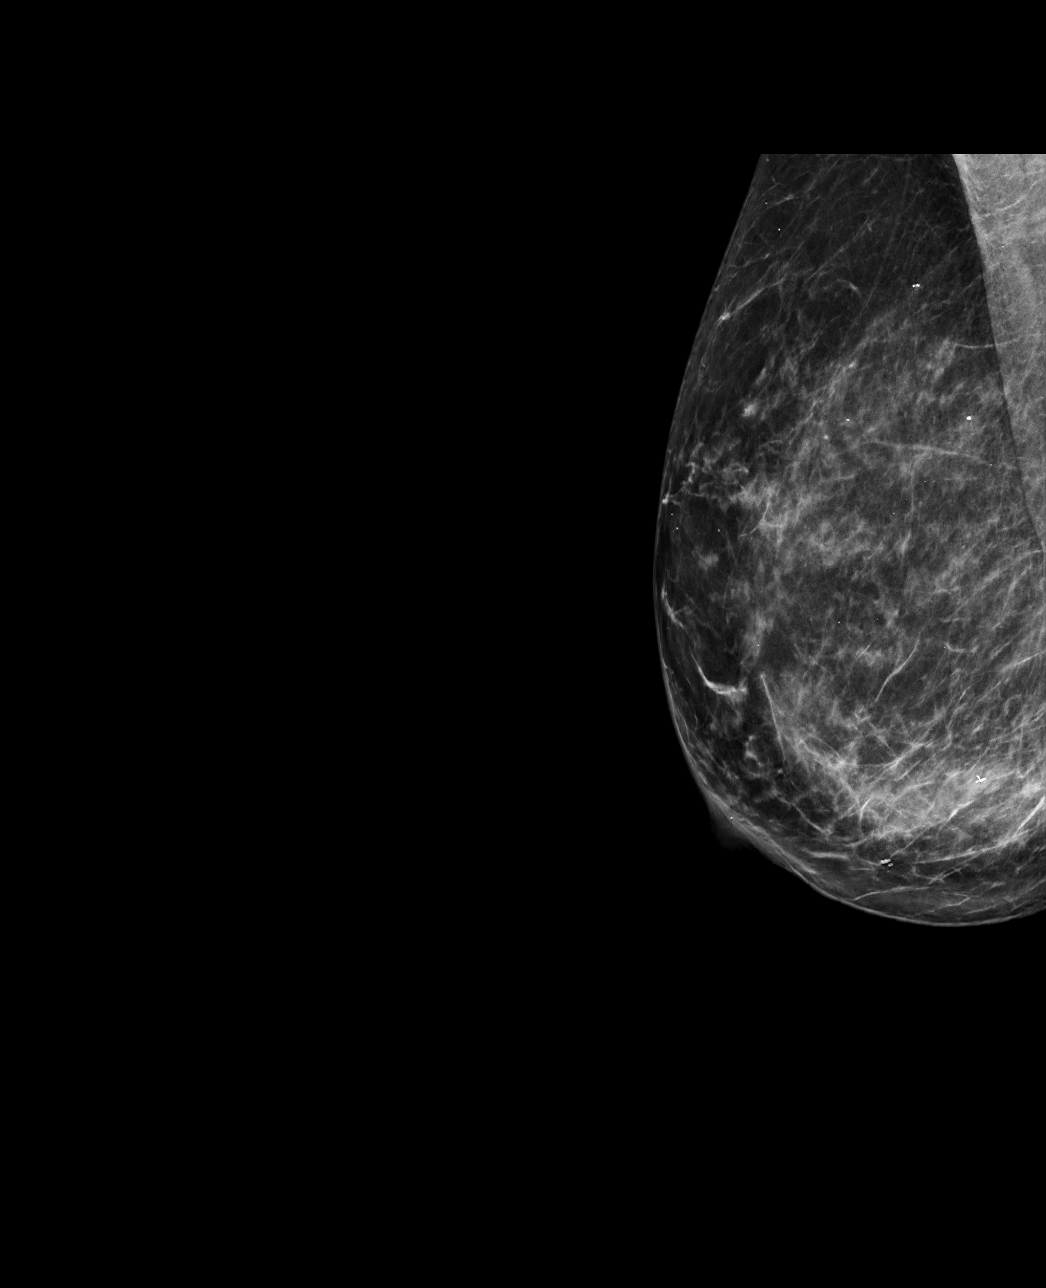

[R ML tomo · tomo slice 39/76.0]
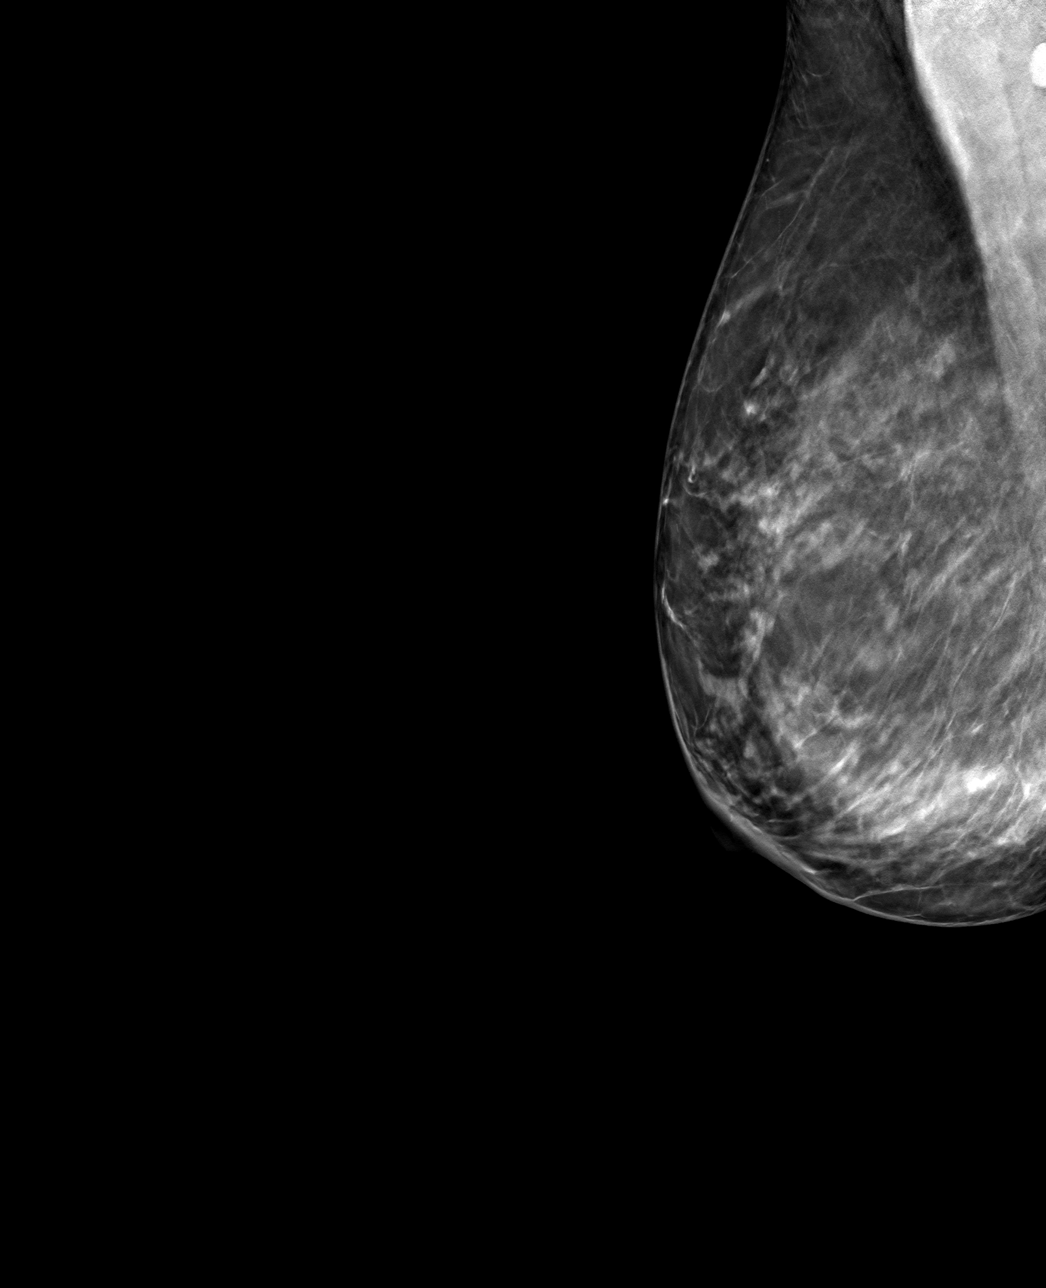

[R CC tomo · tomo slice 35/69.0]
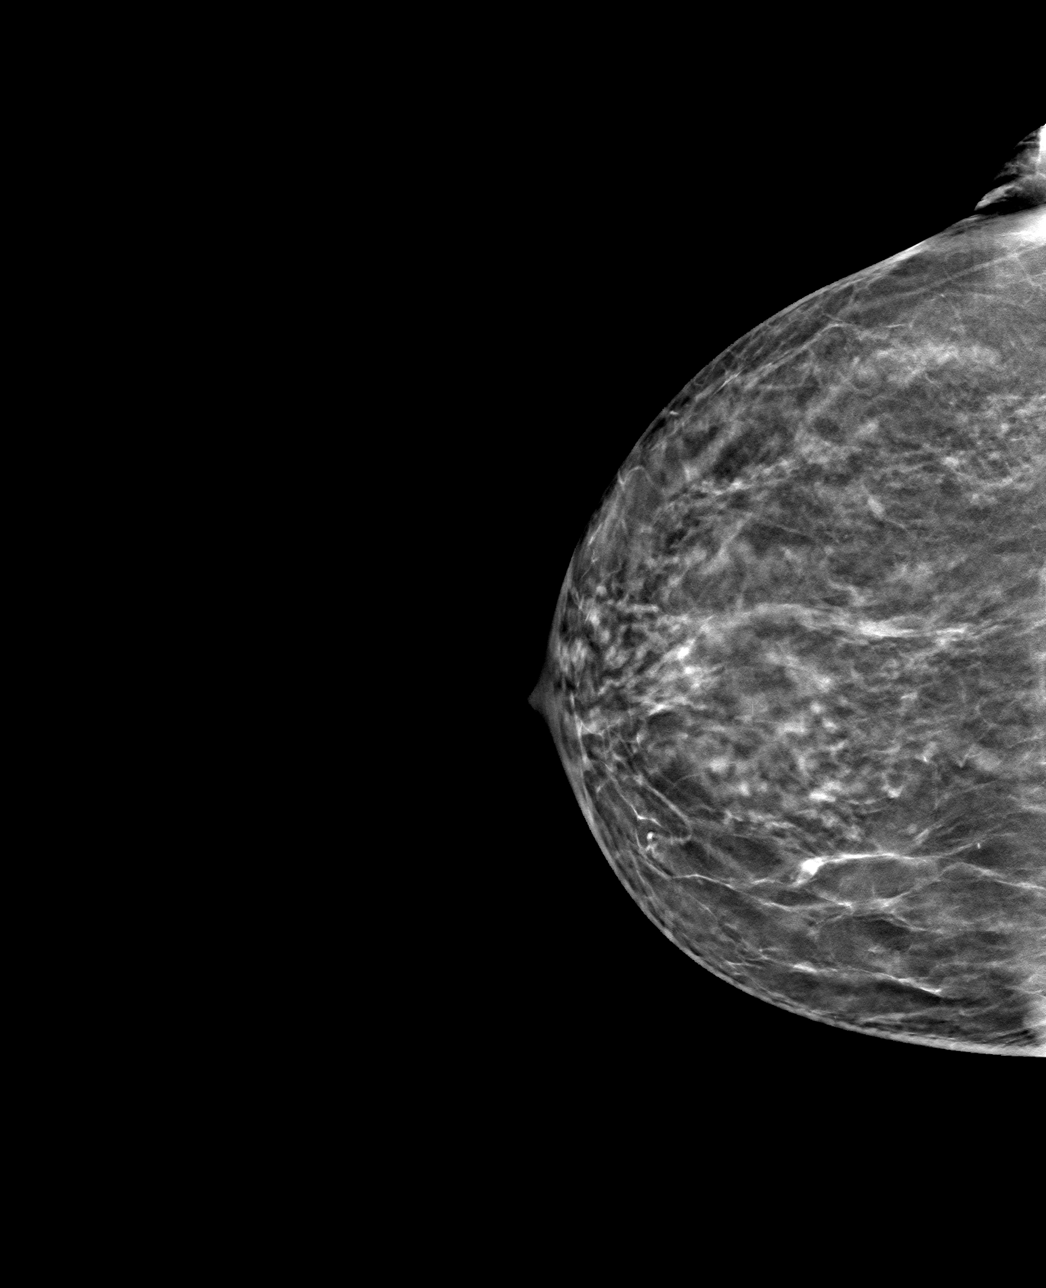

[4 of 12 positions shown; findings below may reference images not displayed]

FINDINGS: Mammographic images were obtained following ultrasound guided biopsy
of right breast mass 6 o'clock position. The biopsy marking clip is
in expected position at the site of biopsy.
IMPRESSION: Appropriate positioning of the ribbon shaped biopsy marking clip at
the site of biopsy in the right breast mass 6 o'clock position.

Final Assessment: Post Procedure Mammograms for Marker Placement

## 2022-08-03 DIAGNOSIS — H40013 Open angle with borderline findings, low risk, bilateral: Secondary | ICD-10-CM | POA: Diagnosis not present

## 2022-08-03 DIAGNOSIS — E119 Type 2 diabetes mellitus without complications: Secondary | ICD-10-CM | POA: Diagnosis not present

## 2022-08-03 DIAGNOSIS — H5213 Myopia, bilateral: Secondary | ICD-10-CM | POA: Diagnosis not present

## 2022-08-03 DIAGNOSIS — H52223 Regular astigmatism, bilateral: Secondary | ICD-10-CM | POA: Diagnosis not present

## 2022-08-04 NOTE — Progress Notes (Signed)
Remote ICD transmission.   

## 2022-08-27 IMAGING — MG MM BREAST SURGICAL SPECIMEN
1 series · 1 of 1 positions shown · non-contrast
Comparison: Previous exam(s).

CLINICAL DATA: Post right breast excision.

EXAM:
SPECIMEN RADIOGRAPH OF THE RIGHT BREAST

[R]
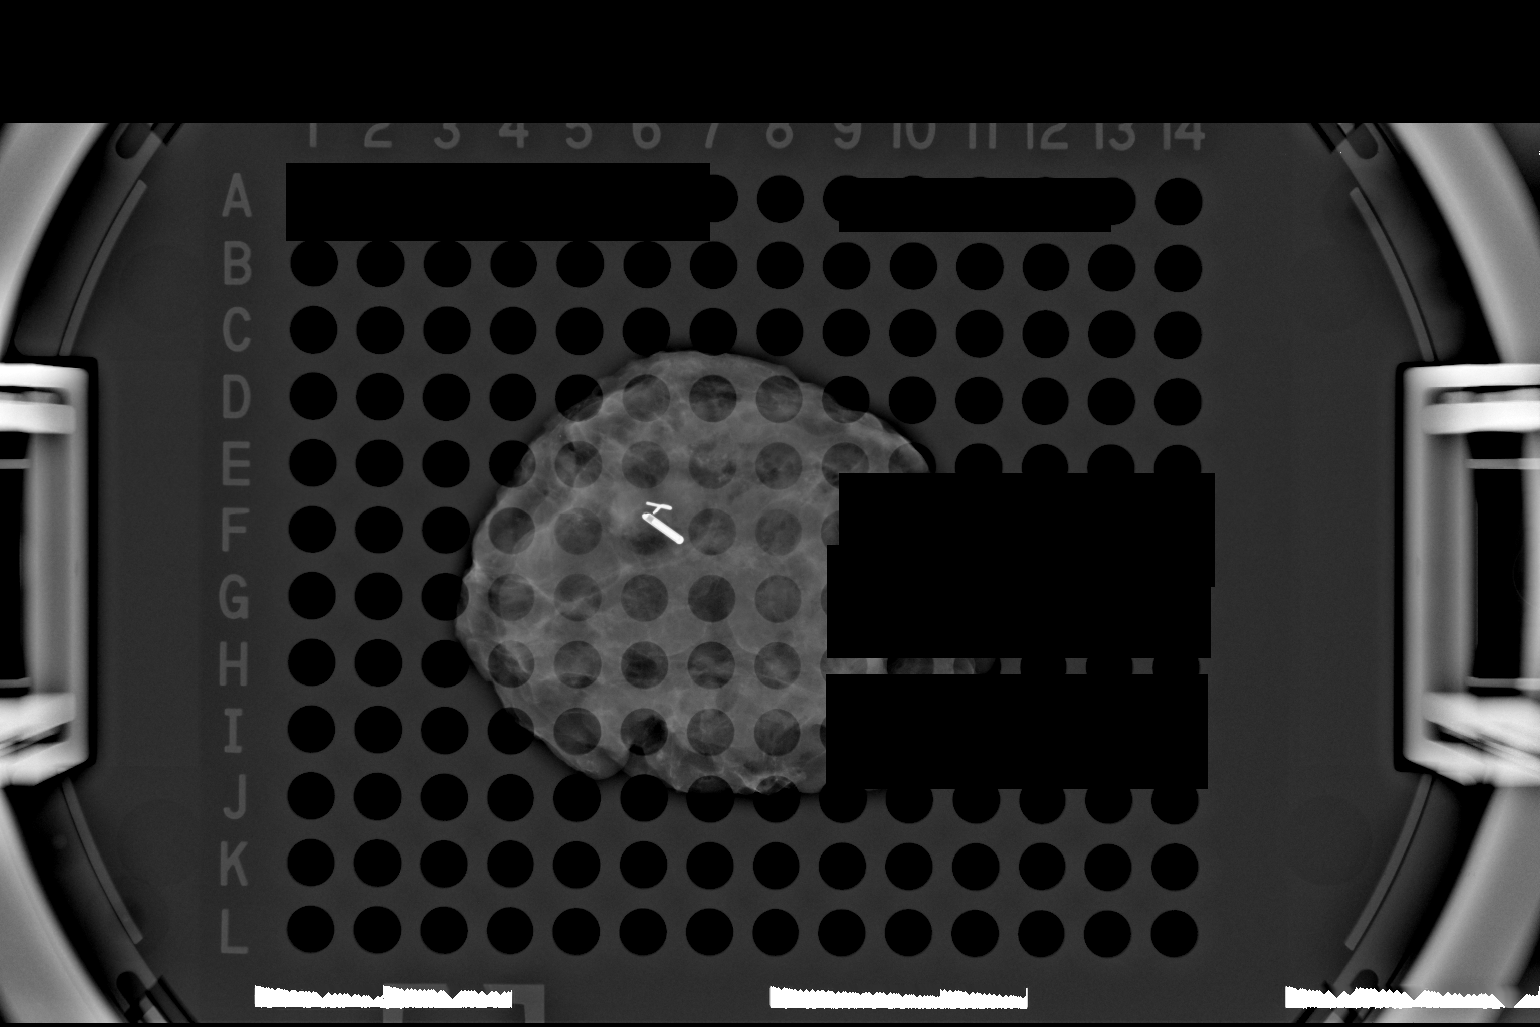

[1 of 1 positions shown; findings below may reference images not displayed]

FINDINGS: Status post excision of the right breast. The radioactive seed and
biopsy marker clip are present, completely intact, and were marked
for pathology.
IMPRESSION: Specimen radiograph of the right breast.

## 2022-09-05 ENCOUNTER — Inpatient Hospital Stay: Payer: HMO | Attending: Hematology and Oncology | Admitting: Hematology and Oncology

## 2022-09-05 DIAGNOSIS — Z9221 Personal history of antineoplastic chemotherapy: Secondary | ICD-10-CM | POA: Insufficient documentation

## 2022-09-05 DIAGNOSIS — Z9889 Other specified postprocedural states: Secondary | ICD-10-CM | POA: Insufficient documentation

## 2022-09-05 DIAGNOSIS — Z923 Personal history of irradiation: Secondary | ICD-10-CM | POA: Insufficient documentation

## 2022-09-05 DIAGNOSIS — Z170 Estrogen receptor positive status [ER+]: Secondary | ICD-10-CM | POA: Insufficient documentation

## 2022-09-05 DIAGNOSIS — Z79811 Long term (current) use of aromatase inhibitors: Secondary | ICD-10-CM | POA: Insufficient documentation

## 2022-09-05 DIAGNOSIS — C50511 Malignant neoplasm of lower-outer quadrant of right female breast: Secondary | ICD-10-CM | POA: Insufficient documentation

## 2022-09-05 NOTE — Assessment & Plan Note (Deleted)
07/01/2020:Screening mammogram showed a right breast mass. Mammogram and US showed a 0.7cm mass at the 6 o'clock position in the right breast, no axillary adenopathy. 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 T1BN0 stage Ia  08/11/2020:Right lumpectomy Toni [name]): IDC, grade 3, 0.8cm, clear margins, 3 right axillary lymph nodes negative for carcinoma.ER 30% week, PR 0%, HER-2 positive, Ki-67 40%  Treatment plan: 1.adjuvant Taxol Herceptin/Herceptin discontinued after 5 cycles due to worsening cardiomyopathy, Taxol completed 11/24/20 2.Adjuvant radiation therapycompleted 01/11/21 3.Follow-up adjuvant antiestrogen therapy Patient is participating in the neuropathy clinical trial SWOG S1714 --------------------------------------------------------------------------------------------------------------------------------------- Current treatment:Anti estrogen therapywith anastrozole to start 01/13/2021  She has a grade 1 peripheral sensory neuropathy.  echocardiogram on 12/20/2020.EF 25-30%: She follows with Dr. Algernon [name].  Herceptin discontinued   Anastrozole toxicities: 1. Hot flashes: But they are not bothering her significantly 2. Joint stiffness: Resolved after taking it at bedtime  Breast cancer surveillance: 1.  Breast exam 09/06/2031: Benign 2. mammogram and bone density12/03/2021: T score -0.4, mammogram benign breast density category B  RTC in 1 year for follow-up

## 2022-09-07 DIAGNOSIS — C50511 Malignant neoplasm of lower-outer quadrant of right female breast: Secondary | ICD-10-CM

## 2022-09-07 NOTE — Research (Addendum)
S1714 - A Prospective Observational Cohort Study to Develop a Predictive Model of Taxane-Induced Peripheral Neuropathy in Cancer Patients :  104 Week / 2 Year Follow Up:  Research nurse called patient to complete her questionnaires and Solicited Neuropathy Events Form for her 104 week follow up visit per protocol. The patient did not answer, message left informing patient of my identity and reason for the call today. Requested she return call at her earliest convenience, research nurses phone number left on VM.  Jeral Fruit, RN 09/07/22 2:52 PM  Patient returned call to this nurse today. She states she does have the time to complete her questionnaires over the telephone. States she missed her appointment with Dr. Lindi Adie last week as she didn't know she had one. She said she didn't receive a call or anything. The Solicited Neuropathy Events Form was reviewed with the patient. She denies any dysesthesia, neuralgia, paresthesia, peripheral motor neuropathy, or peripheral sensory neuropathy. States she can't remember when she had any of those symptoms. The [id number] was completed , answered yes for opening a jar or bottle because of weakness in her hands quite a bit and being dizzy when she gets up from a lying down or sitting position. She states she does have high blood pressure though and attributes to that. The PRO-CTCAE and FACT/GOG -NTX were reviewed without difficulty and no complaints to list on either questionnaire. The patient asked for Dr. Landis Gandy office number, it was provided for her. She states she is calling to re-schedule her appointment now. The clinical trial schedule was reviewed with the patient, she was informed of one more study phone call to review the same information next year. Research nurse will call around this time or see her in clinic with her follow up appointment. Encouraged patient to call if she has any issues, questions or concerns before her next research visit.   Jeral Fruit, RN 09/07/22 3:16 PM

## 2022-09-14 NOTE — Progress Notes (Signed)
Patient Care Team: Donald Prose, MD as PCP - General (Family Medicine) Belva Crome, MD as PCP - Cardiology (Cardiology) Larey Dresser, MD as PCP - Advanced Heart Failure (Cardiology) Coralie Keens, MD as Consulting Physician (General Surgery) Nicholas Lose, MD as Consulting Physician (Hematology and Oncology) Kyung Rudd, MD as Consulting Physician (Radiation Oncology)  DIAGNOSIS: No diagnosis found.  SUMMARY OF ONCOLOGIC HISTORY: Oncology History  Malignant neoplasm of lower-outer quadrant of right breast of female, estrogen receptor positive ([place])  07/01/2020 Initial Diagnosis   Screening mammogram showed a right breast mass. Mammogram and US showed a 0.7cm mass at the 6 o'clock position in the right breast, no axillary adenopathy. 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    07/06/2020 Cancer Staging   Staging form: Breast, AJCC 8th Edition - Clinical stage from 07/06/2020: Stage IB (cT1b, cN0, cM0, G3, ER+, PR-, HER2-)   07/26/2020 Genetic Testing   Negative genetic testing:  No pathogenic variants detected on the [facility] Common Hereditary Cancers Panel. The report date is 07/26/2020.   The Common Hereditary Cancers Panel offered by [facility] includes sequencing and/or deletion duplication testing of the following 48 genes: APC, ATM, AXIN2, BARD1, BMPR1A, BRCA1, BRCA2, BRIP1, CDH1, CDK4, CDKN2A (p14ARF), CDKN2A (p16INK4a), CHEK2, CTNNA1, DICER1, EPCAM (Deletion/duplication testing only), GREM1 (promoter region deletion/duplication testing only), KIT, MEN1, MLH1, MSH2, MSH3, MSH6, MUTYH, NBN, NF1, NTHL1, PALB2, PDGFRA, PMS2, POLD1, POLE, PTEN, RAD50, RAD51C, RAD51D, RNF43, SDHB, SDHC, SDHD, SMAD4, SMARCA4. STK11, TP53, TSC1, TSC2, and VHL.  The following genes were evaluated for sequence changes only: SDHA and HOXB13 c.251G>A variant only.   08/11/2020 Surgery   Right lumpectomy [name])  [phone]): IDC, grade 3, 0.8cm, clear margins, 3 right axillary lymph nodes negative for carcinoma. ER 30% weak, PR 0%, HER-2 positive, Ki-67 40%   08/18/2020 Cancer Staging   Staging form: Breast, AJCC 8th Edition - Pathologic stage from 08/18/2020: Stage IA (pT1b, pN0, cM0, G3, ER+, PR-, HER2+)   09/08/2020 -  Adjuvant Chemotherapy   Weekly Taxol/herceptin x 12 (09/08/2020 - 11/24/2020). Herceptin was stopped after first 5 weekly cycles (last received on 10/06/2020) due to decline in EF from 40% to 30%.     12/15/2020 - 01/11/2021 Radiation Therapy   The patient initially received a dose of 42.56 Gy in 16 fractions to the breast using whole-breast tangent fields. This was delivered using a 3-D conformal technique. The pt received a boost delivering an additional 8 Gy in 4 fractions using a electron boost with 1mV electrons. The total dose was 50.56 Gy.   12/2020 -  Anti-estrogen oral therapy   Anastrozole     CHIEF COMPLIANT: Follow-up of breast cancer  INTERVAL HISTORY: Toni [name] a 66y.o. with above-mentioned history of breast cancer having undergone lumpectomy, adjuvant chemotherapy, and radiation therapy, currently on antiestrogen therapy with anastrozole. She presents to the clinic today for follow-up.   ALLERGIES:  is allergic to biaxin [clarithromycin] and vasotec [enalapril].  MEDICATIONS:  Current Outpatient Medications  Medication Sig Dispense Refill   acetaminophen (TYLENOL) 500 MG tablet Take 1,000 mg by mouth every 6 (six) hours as needed for moderate pain or headache.     anastrozole (ARIMIDEX) 1 MG tablet TAKE ONE TABLET BY MOUTH DAILY 90 tablet 3   aspirin 81 MG chewable tablet Chew 1 tablet (81 mg total) by mouth daily. 30 tablet 0   carvedilol (COREG) 25 MG tablet TAKE  ONE TABLET BY MOUTH TWICE DAILY with meals 180 tablet 3   cetirizine (ZYRTEC) 10 MG tablet Take 10 mg by mouth at bedtime.     Continuous Blood Gluc Receiver (FREESTYLE LIBRE 14 DAY  READER) DEVI Apply topically as directed.     digoxin (LANOXIN) 0.125 MG tablet TAKE 1/2 TABLET BY MOUTH ONCE DAILY 45 tablet 3   furosemide (LASIX) 20 MG tablet Take 20 mg by mouth as needed.     glipiZIDE (GLUCOTROL XL) 10 MG 24 hr tablet Take 20 mg by mouth daily with breakfast.      hydrALAZINE (APRESOLINE) 50 MG tablet TAKE 1 AND 1/2 TABLETS BY MOUTH THREE TIMES DAILY 405 tablet 3   isosorbide dinitrate (ISORDIL) 20 MG tablet TAKE TWO TABLETS BY MOUTH THREE TIMES DAILY 540 tablet 3   JARDIANCE 25 MG TABS tablet Take 25 mg by mouth daily.     losartan (COZAAR) 50 MG tablet TAKE ONE TABLET BY MOUTH TWICE DAILY 60 tablet 11   metFORMIN (GLUCOPHAGE) 1000 MG tablet Take 1,000 mg by mouth 2 (two) times daily with a meal.     Multiple Vitamin (MULTIVITAMIN WITH MINERALS) TABS tablet Take 1 tablet by mouth daily. Centrum Silver     Semaglutide, 1 MG/DOSE, (OZEMPIC, 1 MG/DOSE,) 2 MG/1.5ML SOPN Inject 1 mg as directed once a week.     simvastatin (ZOCOR) 20 MG tablet Take 20 mg by mouth at bedtime.      spironolactone (ALDACTONE) 25 MG tablet TAKE ONE TABLET BY MOUTH EVERY EVENING 90 tablet 3   TRUE METRIX BLOOD GLUCOSE TEST test strip 1 each by Other route as directed.   5   TRUEPLUS LANCETS 30G MISC 1 each by Other route as directed. Use as directed.  5   UNIFINE PENTIPS 32G X 4 MM MISC 1 each by Other route as directed.      No current facility-administered medications for this visit.    PHYSICAL EXAMINATION: ECOG PERFORMANCE STATUS: {CHL ONC ECOG PS:[phone]}  There were no vitals filed for this visit. There were no vitals filed for this visit.  BREAST:*** No palpable masses or nodules in either right or left breasts. No palpable axillary supraclavicular or infraclavicular adenopathy no breast tenderness or nipple discharge. (exam performed in the presence of a chaperone)  LABORATORY DATA:  I have reviewed the data as listed    Latest Ref Rng & Units 05/30/2022   10:04 AM 02/12/2022    11:09 AM 10/25/2021    9:42 AM  CMP  Glucose 70 - 99 mg/dL 135  157  196   BUN 8 - 23 mg/dL _0 Creatinine 0.44 - 1.00 mg/dL 0.91  0.79  0.80   Sodium 135 - 145 mmol/L 139  136  140   Potassium 3.5 - 5.1 mmol/L 4.0  4.4  4.0   Chloride 98 - 111 mmol/L 103  102  107   CO2 22 - 32 mmol/L _1 Calcium 8.9 - 10.3 mg/dL 9.9  10.3  9.8     Lab Results  Component Value Date   WBC 5.5 01/27/2021   HGB 14.1 01/27/2021   HCT 44.9 01/27/2021   MCV 95.5 01/27/2021   PLT 265 01/27/2021   NEUTROABS 2.3 11/24/2020    ASSESSMENT & PLAN:  No problem-specific Assessment & Plan notes found for this encounter.    No orders of the defined types were placed in this encounter.  The  patient has a good understanding of the overall plan. she agrees with it. she will call with any problems that may develop before the next visit here. Total time spent: 30 mins including face to face time and time spent for planning, charting and co-ordination of care   Suzzette Righter, [place] 09/14/22    I Gardiner Coins am scribing for Dr. Lindi Adie  ***

## 2022-09-18 ENCOUNTER — Other Ambulatory Visit: Payer: Self-pay

## 2022-09-18 ENCOUNTER — Inpatient Hospital Stay (HOSPITAL_BASED_OUTPATIENT_CLINIC_OR_DEPARTMENT_OTHER): Payer: HMO | Admitting: Hematology and Oncology

## 2022-09-18 DIAGNOSIS — Z170 Estrogen receptor positive status [ER+]: Secondary | ICD-10-CM

## 2022-09-18 DIAGNOSIS — C50511 Malignant neoplasm of lower-outer quadrant of right female breast: Secondary | ICD-10-CM

## 2022-09-18 DIAGNOSIS — Z923 Personal history of irradiation: Secondary | ICD-10-CM | POA: Diagnosis not present

## 2022-09-18 DIAGNOSIS — Z79811 Long term (current) use of aromatase inhibitors: Secondary | ICD-10-CM | POA: Diagnosis not present

## 2022-09-18 DIAGNOSIS — Z9221 Personal history of antineoplastic chemotherapy: Secondary | ICD-10-CM | POA: Diagnosis not present

## 2022-09-18 DIAGNOSIS — Z9889 Other specified postprocedural states: Secondary | ICD-10-CM | POA: Diagnosis not present

## 2022-09-18 MED ORDER — ANASTROZOLE 1 MG PO TABS: 1.0000 mg | ORAL_TABLET | Freq: Every day | ORAL | 3 refills | Status: DC

## 2022-09-18 NOTE — Assessment & Plan Note (Signed)
07/01/2020:Screening mammogram showed a right breast mass. Mammogram and US showed a 0.7cm mass at the 6 o'clock position in the right breast, no axillary adenopathy. 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 T1BN0 stage Ia  08/11/2020:Right lumpectomy [name]): IDC, grade 3, 0.8cm, clear margins, 3 right axillary lymph nodes negative for carcinoma.ER 30% week, PR 0%, HER-2 positive, Ki-67 40%  Treatment plan: 1.adjuvant Taxol Herceptin/Herceptin discontinued after 5 cycles due to worsening cardiomyopathy, Taxol completed 11/24/20 2.Adjuvant radiation therapycompleted 01/11/21 3.Follow-up adjuvant antiestrogen therapy Patient is participating in the neuropathy clinical trial SWOG S1714 --------------------------------------------------------------------------------------------------------------------------------------- Current treatment:Anti estrogen therapywith anastrozole to start 01/13/2021  She has a grade 1 peripheral sensory neuropathy.  echocardiogram on 12/20/2020.EF 25-30%: She follows with Dr. Algernon Huxley.  Herceptin discontinued   Anastrozole toxicities: 1. Hot flashes: But they are not bothering her significantly 2. Joint stiffness: Resolved after taking it at bedtime  Breast cancer surveillance: 1.  Breast exam 09/18/2022: Benign 2. mammogram and bone density 10/23/2021: Benign breast density category B,  bone density T score -0.4: Normal  RTC in 1 year for follow-up

## 2022-10-08 ENCOUNTER — Ambulatory Visit (INDEPENDENT_AMBULATORY_CARE_PROVIDER_SITE_OTHER): Payer: HMO

## 2022-10-08 DIAGNOSIS — I5022 Chronic systolic (congestive) heart failure: Secondary | ICD-10-CM

## 2022-10-09 LAB — CUP PACEART REMOTE DEVICE CHECK
Battery Remaining Longevity: 150 mo
Battery Remaining Percentage: 100 %
Brady Statistic RV Percent Paced: 0 %
Date Time Interrogation Session: 20231120050100
HighPow Impedance: 59 Ohm
Implantable Lead Connection Status: 753985
Implantable Lead Implant Date: 20200730
Implantable Lead Location: 753860
Implantable Lead Model: 292
Implantable Lead Serial Number: 447014
Implantable Pulse Generator Implant Date: 20200730
Lead Channel Impedance Value: 425 Ohm
Lead Channel Pacing Threshold Amplitude: 1.4 V
Lead Channel Pacing Threshold Pulse Width: 0.4 ms
Lead Channel Setting Pacing Amplitude: 3.5 V
Lead Channel Setting Pacing Pulse Width: 0.4 ms
Lead Channel Setting Sensing Sensitivity: 0.5 mV
Pulse Gen Serial Number: 266091
Zone Setting Status: 755011

## 2022-10-23 ENCOUNTER — Other Ambulatory Visit: Payer: Self-pay | Admitting: Adult Health

## 2022-10-23 DIAGNOSIS — R928 Other abnormal and inconclusive findings on diagnostic imaging of breast: Secondary | ICD-10-CM

## 2022-11-20 NOTE — Progress Notes (Signed)
Remote ICD transmission.   

## 2022-12-01 ENCOUNTER — Other Ambulatory Visit (HOSPITAL_COMMUNITY): Payer: Self-pay | Admitting: Cardiology

## 2022-12-07 DIAGNOSIS — E785 Hyperlipidemia, unspecified: Secondary | ICD-10-CM | POA: Diagnosis not present

## 2022-12-07 DIAGNOSIS — I10 Essential (primary) hypertension: Secondary | ICD-10-CM | POA: Diagnosis not present

## 2022-12-07 DIAGNOSIS — E1165 Type 2 diabetes mellitus with hyperglycemia: Secondary | ICD-10-CM | POA: Diagnosis not present

## 2022-12-07 DIAGNOSIS — I5022 Chronic systolic (congestive) heart failure: Secondary | ICD-10-CM | POA: Diagnosis not present

## 2022-12-11 ENCOUNTER — Ambulatory Visit
Admission: RE | Admit: 2022-12-11 | Discharge: 2022-12-11 | Disposition: A | Discharge status: Home / Self Care | Payer: PPO | Source: Ambulatory Visit | Attending: Adult Health | Admitting: Adult Health

## 2022-12-11 DIAGNOSIS — R928 Other abnormal and inconclusive findings on diagnostic imaging of breast: Secondary | ICD-10-CM

## 2022-12-11 DIAGNOSIS — Z853 Personal history of malignant neoplasm of breast: Secondary | ICD-10-CM | POA: Diagnosis not present

## 2022-12-25 DIAGNOSIS — I429 Cardiomyopathy, unspecified: Secondary | ICD-10-CM | POA: Diagnosis not present

## 2022-12-25 DIAGNOSIS — E1165 Type 2 diabetes mellitus with hyperglycemia: Secondary | ICD-10-CM | POA: Diagnosis not present

## 2022-12-25 DIAGNOSIS — E785 Hyperlipidemia, unspecified: Secondary | ICD-10-CM | POA: Diagnosis not present

## 2022-12-25 DIAGNOSIS — I10 Essential (primary) hypertension: Secondary | ICD-10-CM | POA: Diagnosis not present

## 2022-12-25 DIAGNOSIS — E1169 Type 2 diabetes mellitus with other specified complication: Secondary | ICD-10-CM | POA: Diagnosis not present

## 2023-01-07 ENCOUNTER — Ambulatory Visit: Payer: HMO

## 2023-01-07 DIAGNOSIS — I428 Other cardiomyopathies: Secondary | ICD-10-CM | POA: Diagnosis not present

## 2023-01-09 LAB — CUP PACEART REMOTE DEVICE CHECK
Battery Remaining Longevity: 138 mo
Battery Remaining Percentage: 98 %
Brady Statistic RV Percent Paced: 0 %
Date Time Interrogation Session: 20240219210300
HighPow Impedance: 60 Ohm
Implantable Lead Connection Status: 753985
Implantable Lead Implant Date: 20200730
Implantable Lead Location: 753860
Implantable Lead Model: 292
Implantable Lead Serial Number: 447014
Implantable Pulse Generator Implant Date: 20200730
Lead Channel Impedance Value: 400 Ohm
Lead Channel Pacing Threshold Amplitude: 1.4 V
Lead Channel Pacing Threshold Pulse Width: 0.4 ms
Lead Channel Setting Pacing Amplitude: 3.5 V
Lead Channel Setting Pacing Pulse Width: 0.4 ms
Lead Channel Setting Sensing Sensitivity: 0.5 mV
Pulse Gen Serial Number: 266091
Zone Setting Status: 755011

## 2023-02-11 ENCOUNTER — Encounter (HOSPITAL_COMMUNITY): Payer: Self-pay | Admitting: Cardiology

## 2023-02-18 NOTE — Progress Notes (Signed)
Remote ICD transmission.   

## 2023-03-04 DIAGNOSIS — E785 Hyperlipidemia, unspecified: Secondary | ICD-10-CM | POA: Diagnosis not present

## 2023-03-04 DIAGNOSIS — E1165 Type 2 diabetes mellitus with hyperglycemia: Secondary | ICD-10-CM | POA: Diagnosis not present

## 2023-03-04 DIAGNOSIS — I5022 Chronic systolic (congestive) heart failure: Secondary | ICD-10-CM | POA: Diagnosis not present

## 2023-03-04 DIAGNOSIS — I10 Essential (primary) hypertension: Secondary | ICD-10-CM | POA: Diagnosis not present

## 2023-03-06 ENCOUNTER — Other Ambulatory Visit (HOSPITAL_COMMUNITY): Payer: Self-pay | Admitting: Cardiology

## 2023-03-22 ENCOUNTER — Encounter (HOSPITAL_COMMUNITY): Payer: Self-pay | Admitting: Cardiology

## 2023-03-22 ENCOUNTER — Ambulatory Visit (HOSPITAL_COMMUNITY)
Admission: RE | Admit: 2023-03-22 | Discharge: 2023-03-22 | Disposition: A | Discharge status: Home / Self Care | Payer: PPO | Source: Ambulatory Visit | Attending: Cardiology | Admitting: Cardiology

## 2023-03-22 VITALS — BP 104/60 | HR 93 | Wt 133.2 lb

## 2023-03-22 DIAGNOSIS — I110 Hypertensive heart disease with heart failure: Secondary | ICD-10-CM | POA: Insufficient documentation

## 2023-03-22 DIAGNOSIS — Z95810 Presence of automatic (implantable) cardiac defibrillator: Secondary | ICD-10-CM | POA: Diagnosis not present

## 2023-03-22 DIAGNOSIS — Z79899 Other long term (current) drug therapy: Secondary | ICD-10-CM | POA: Diagnosis not present

## 2023-03-22 DIAGNOSIS — I5022 Chronic systolic (congestive) heart failure: Secondary | ICD-10-CM | POA: Diagnosis not present

## 2023-03-22 DIAGNOSIS — E119 Type 2 diabetes mellitus without complications: Secondary | ICD-10-CM | POA: Insufficient documentation

## 2023-03-22 DIAGNOSIS — C50919 Malignant neoplasm of unspecified site of unspecified female breast: Secondary | ICD-10-CM | POA: Diagnosis not present

## 2023-03-22 DIAGNOSIS — E785 Hyperlipidemia, unspecified: Secondary | ICD-10-CM | POA: Insufficient documentation

## 2023-03-22 DIAGNOSIS — I428 Other cardiomyopathies: Secondary | ICD-10-CM | POA: Insufficient documentation

## 2023-03-22 LAB — BASIC METABOLIC PANEL
Anion gap: 8 (ref 5–15)
BUN: 14 mg/dL (ref 8–23)
CO2: 27 mmol/L (ref 22–32)
Calcium: 9.7 mg/dL (ref 8.9–10.3)
Chloride: 103 mmol/L (ref 98–111)
Creatinine, Ser: 0.89 mg/dL (ref 0.44–1.00)
GFR, Estimated: >60 mL/min (ref >60)
Glucose, Bld: 124 mg/dL — ABNORMAL HIGH (ref 70–99)
Potassium: 4.5 mmol/L (ref 3.5–5.1)
Sodium: 138 mmol/L (ref 135–145)

## 2023-03-22 LAB — DIGOXIN LEVEL: Digoxin Level: 0.4 ng/mL — ABNORMAL LOW (ref 0.8–2.0)

## 2023-03-22 LAB — BRAIN NATRIURETIC PEPTIDE: B Natriuretic Peptide: 49.6 pg/mL (ref 0.0–100.0)

## 2023-03-22 MED ORDER — LOSARTAN POTASSIUM 50 MG PO TABS: 75.0000 mg | ORAL_TABLET | Freq: Two times a day (BID) | ORAL | 3 refills | Status: DC

## 2023-03-22 NOTE — Patient Instructions (Addendum)
INCREASE Losartan to 75 mg ( 1 1/2 Tab) Twice daily  Labs done today, your results will be available in MyChart, we will contact you for abnormal readings.   Expect to be in the lab for 2 hours. Please plan to arrive 30 minutes prior to your appointment. You may be asked to reschedule your test if you arrive 20 minutes or more after your scheduled appointment time.  Main Campus address: [address] [place], [place] [address] You may arrive to the Main Entrance A or Entrance C (free valet parking is available at both). -Main Entrance A (on [address]) :proceed to admitting for check in -Entrance C (on [facility]): proceed to [employer] parking or under hospital deck parking using this code _________  Check In: [facility] waiting room (1st floor)   General Instructions for the day of the test (Please follow all instructions from your physician): Refrain from ingesting a heavy meal, alcohol, or caffeine or using tobacco products within 2 hours of the test (DO NOT FAST for mare than 8 hours). You may have all other non-alcoholic, non -caffeinated beverage,a light snack (crackers,a piece of fruit, carrot sticks, toast bagel,etc) up to your appointment. Avoid significant exertion or exercise within 24 hours of your test. Be prepared to exercise and sweat. Your clothing should permit freedom of movement and include walking or running shoes. Women bring loose fitting short sleeved blouse.  This evaluation may be fatiguing and you may wish ti have someone accompany you to the assessment to drive you home afterward. Bring a list of your medications with you, including dosage and frequency you take the medications (  I.e.,once per day, twice per day, etc). Take all medications as prescribed, unless noted below or instructed to do so by your physician.  Please do not take the following medications prior to your  CPX:  _________________________________________________  _________________________________________________  Brief description of the test: A brief lung test will be performed. This will involve you taking deep breaths and blowing hard and fast through your mouth. During these , a clip will be on your nose and you will be breathing through a breathing device.   For the exercise portion of the test you will be walking on a treadmill, or riding a stationary bike, to your maximal effor or until symptoms such as chest pain, shortness of breath, leg pain or dizziness limit your exercise. You will be breathing in and out of a breathing device through your mouth (a clip will be on your nose again). Your heart rate, ECG, blood pressure, oxygen saturations, breathing rate and depth, amount of oxygen you consume and amount of carbon dioxide you produce will be measured and monitored throughout the exercise test.  If you need to cancel or reschedule your appointment please call [phone] If you have further questions please call your physician or Philip Aspen, MS, ACSM-RCEP at [phone]  Your physician has requested that you have an echocardiogram. Echocardiography is a painless test that uses sound waves to create images of your heart. It provides your doctor with information about the size and shape of your heart and how well your heart's chambers and valves are working. This procedure takes approximately one hour. There are no restrictions for this procedure. Please do NOT wear cologne, perfume, aftershave, or lotions (deodorant is allowed). Please arrive 15 minutes prior to your appointment time.  Your physician recommends that you schedule a follow-up appointment in: 4 months with an echocardiogram   If  you have any questions or concerns before your next appointment please send [place] a message through [place] or call our office at [phone].    TO LEAVE A MESSAGE FOR THE NURSE SELECT OPTION 2,  PLEASE LEAVE A MESSAGE INCLUDING: YOUR NAME DATE OF BIRTH CALL BACK NUMBER REASON FOR CALL**this is important as we prioritize the call backs  YOU WILL RECEIVE A CALL BACK THE SAME DAY AS LONG AS YOU CALL BEFORE 4:00 PM  At the [facility], you and your health needs are our priority. As part of our continuing mission to provide you with exceptional heart care, we have created designated Provider Care Teams. These Care Teams include your primary Cardiologist (physician) and Advanced Practice Providers (APPs- Physician Assistants and Nurse Practitioners) who all work together to provide you with the care you need, when you need it.   You may see any of the following providers on your designated Care Team at your next follow up: Dr Arvilla Meres Dr Marca Ancona Dr. Marcos Eke, NP Robbie Lis, [place] [facility] [place], [place] Brynda Peon, NP Karle Plumber, PharmD   Please be sure to bring in all your medications bottles to every appointment.    Thank you for choosing [facility]

## 2023-03-23 NOTE — Progress Notes (Signed)
PCP: Dr. Wynelle Link Cardiology: Dr. Katrinka Blazing HF Cardiology: Dr. Shirlee Latch Oncology: Dr. Pamelia Hoit  Toni [name] is a 67 y.o. [employer] employee with a history of chronic systolic heart failure, due to nonischemic cardiomyopathy, HTN, DM, and hyperlipidemia.    Initially diagnosed with NICM in 1998 thought to be from HTN versus viral. Had cath in 1998 that was negative for coronary disease. EF at that time was 20% but EF recovered in 2012.    In October 2019, she had a cough/virus and she took OTC meds. Says she would feel better for a little while but then felt bad again. She has been working full time as Artist at [employer] and prior to admission in 12/19 she had noticed increased fatigue and dyspnea.  She presented to [facility] ED on 11/13/18 with increased shortness of breath. She was admitted and echo was completed showing EF had gone back down to 15%. She had RHC/LHC with nonobstructive CAD and relatively preserved cardiac output. She was diuresed in the hospital and discharged. CPX in 2/20 showed only mild HF limitation.   Given family history of cardiomyopathy, I sent genetic testing.  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  I had her see Dr. Jomarie Longs, we think that the variant may be benign and unrelated to her cardiomyopathy.  She has no history of conduction disturbance.   Echo in 6/20 showed that EF remains low at 25-30%, mild LV dilation, mildly decreased RV systolic function.  She had a Environmental manager ICD placed.   8/21 diagnosed with right breast cancer, ER+/PR-/HER2+.  She had right lumpectomy.   Echo in 10/21 showed EF 40% with mildly decreased RV systolic function. She has been getting Taxol + Herceptin.  Repeat echo in 11/21 showed EF 30% with mild LV dilation, mildly decreased RV systolic function. Herceptin was stopped and it was decided that she would be a poor candidate for it in the future.   Echo in 1/22 showed EF 25-30%, diffuse hypokinesis, mildly  decreased RV systolic function.  CPX in 3/22 showed mild HF limitation.   Echo 3/23 showed EF 20-25%, mildly decreased RV systolic function, mild MR.   Today she returns for HF follow up. Weight down 3 lbs, she is on semaglutide.  She is lightheaded if she stands too fast.  Back and leg pain is now considerably better, able to exercise. No exertional dyspnea or chest pain.  Rarely uses Lasix.   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): Heartlogic score 0    ECG (personally reviewed): NSR, PVC, LVH  Labs (1/20): K 4, creatinine 0.67 => 0.74 Labs (3/20): K 4.1, creatinine 0.87 Labs (6/20): digoxin level 0.9 Labs (7/20): K 4.1, creatinine 0.74 Labs (8/20): digoxin 0.4 Labs (11/20): K 4.5, creatinine 0.65, digoxin 1.2 Labs (2/21): digoxin 0.4, K 4.2, creatinine 0.9 Labs (6/21): K 5.1, creatinine 0.83 Labs (9/21): K 4.6, creatinine 0.89 Labs (11/21): K 4, creatinine 0.78 Labs (1/22): K 4.4, creatinine 0.89 Labs (3/22): K 4.2, creatinine 0.72 Labs (7/22): K 4.8, creatinine 0.92, LDL 63 Labs (8/22): digoxin 0.3 Labs (11/22): K 4.9, creatinine 0.83 Labs (12/22): K 4, creatinine 0.8, digoxin 0.7 Labs (3/23): K 4.4, creatinine 0.79 Labs (7/23): K 4, creatinine 0.91, digoxin 0.6  PMH: 1. HTN 2. Type 2 diabetes 3. Hyperlipidemia 4. Chronic systolic CHF: Nonischemic cardiomyopathy.  Diagnosed in 1998, EF 20% by echo at that time.  Echo back to normal range by 2012.   - LHC/RHC (12/19): D1 60-70%  stenosis; mean RA 6, PA 58/22, mean PCWP 22, CI 2.9.  - Echo (12/19): EF 15% with severe LV dilation, moderate central MR likely functional.  - Cardiac MRI (12/19): Moderate LV dilation with EF 14%, mild RV dilation with EF 17%, LGE at the inferior RV insertion site (nonspecific).  - CPX (2/20): peak VO2 18.6, VE/VCO2 31, RER 1.18 => mild HF limitation.  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  Suspect the variant is  benign.  - Echo (6/20): EF 25-30%, mild LV dilation, mildly decreased RV systolic function.  - Echo (10/21): EF 40%, diffuse hypokinesis, mildly decreased RV systolic function.  - Echo (11/21): EF 30%, diffuse hypokinesis, mildly decreased RV systolic function.  - Echo (1/22): EF 25-30%, diffuse hypokinesis, mildly decreased RV systolic function.  - CPX (3/22): Peak VO2 22, VE/VCO2 slope 33, RER 1.13.  Mild HF limitation.  - Echo (3/23): EF 20-25%, mildly decreased RV systolic function, mild MR.  5. Angioedema with ACEI 6. Left shoulder adhesive capsulitis 7. Depression 8. Breast cancer: 8/21 diagnosed with right breast cancer, ER+/PR-/HER2+.  She had right lumpectomy.  Radiation ongoing.   Social History   Socioeconomic History   Marital status: Single    Spouse name: Not on file   Number of children: Not on file   Years of education: 16   Highest education level: Bachelor's degree (e.g., BA, AB, BS)  Occupational History   Occupation: Special educational needs teacher:   Tobacco Use   Smoking status: Never   Smokeless tobacco: Never  Vaping Use   Vaping Use: Never used  Substance and Sexual Activity   Alcohol use: Yes    Alcohol/week: 0.0 standard drinks of alcohol    Comment: less than once a month   Drug use: No   Sexual activity: Never  Other Topics Concern   Not on file  Social History Narrative   Works at [employer].  Lives alone.     Social Determinants of Health   Financial Resource Strain: Low Risk  (07/06/2020)   Overall Financial Resource Strain (CARDIA)    Difficulty of Paying Living Expenses: Not hard at all  Food Insecurity: No Food Insecurity (10/28/2020)   Hunger Vital Sign    Worried About Running Out of Food in the Last Year: Never true    Ran Out of Food in the Last Year: Never true  Transportation Needs: No Transportation Needs (10/28/2020)   PRAPARE - Administrator, Civil Service (Medical): No    Lack of Transportation  (Non-Medical): No  Physical Activity: Inactive (11/03/2019)   Exercise Vital Sign    Days of Exercise per Week: 0 days    Minutes of Exercise per Session: 0 min  Stress: Stress Concern Present (11/03/2019)   [employer] of Occupational Health - Occupational Stress Questionnaire    Feeling of Stress : Very much  Social Connections: Not on file  Intimate Partner Violence: Not At Risk (12/06/2020)   Humiliation, Afraid, Rape, and Kick questionnaire    Fear of Current or Ex-Partner: No    Emotionally Abused: No    Physically Abused: No    Sexually Abused: No   Family History  Problem Relation Age of Onset   Diabetes Mellitus I Mother    Lung cancer Mother 55   Breast cancer Mother        dx. late 30s/early 66s   Diabetes Mellitus I Father    Sudden  death Father 14   Prostate cancer Brother 38   Hypertension Brother    Hypertension Brother    Diabetes Mellitus I Brother    Benign prostatic hyperplasia Brother    Heart failure Paternal Uncle    Thyroid cancer Niece        dx. in her 30s   Breast cancer Cousin        dx. in her 57s, recurrence in her 69s (maternal first cousin)   Breast cancer Cousin        female dx. in his early 60s (paternal first cousin)   Cancer Cousin        dx. in his early 63s, unknown type (paternal first cousin)   Cancer Cousin 14       unknown type (paternal first cousin)   ROS: All systems reviewed and negative except as per HPI.   Current Outpatient Medications  Medication Sig Dispense Refill   acetaminophen (TYLENOL) 500 MG tablet Take 1,000 mg by mouth every 6 (six) hours as needed for moderate pain or headache.     anastrozole (ARIMIDEX) 1 MG tablet Take 1 tablet (1 mg total) by mouth daily. 90 tablet 3   aspirin 81 MG chewable tablet Chew 1 tablet (81 mg total) by mouth daily. 30 tablet 0   carvedilol (COREG) 25 MG tablet TAKE ONE TABLET BY MOUTH TWICE DAILY with meals 180 tablet 4   cetirizine (ZYRTEC) 10 MG tablet Take 10 mg by mouth at  bedtime.     Continuous Blood Gluc Receiver (FREESTYLE LIBRE 14 DAY READER) DEVI Apply topically as directed.     digoxin (LANOXIN) 0.125 MG tablet TAKE 1/2 TABLET BY MOUTH ONCE DAILY 45 tablet 0   furosemide (LASIX) 20 MG tablet Take 20 mg by mouth as needed.     glipiZIDE (GLUCOTROL XL) 10 MG 24 hr tablet Take 20 mg by mouth daily with breakfast.      hydrALAZINE (APRESOLINE) 50 MG tablet TAKE 1 AND 1/2 TABLETS BY MOUTH THREE TIMES DAILY 405 tablet 3   isosorbide dinitrate (ISORDIL) 20 MG tablet TAKE TWO TABLETS BY MOUTH THREE TIMES DAILY 540 tablet 3   JARDIANCE 25 MG TABS tablet Take 25 mg by mouth daily.     metFORMIN (GLUCOPHAGE) 1000 MG tablet Take 1,000 mg by mouth 2 (two) times daily with a meal.     Multiple Vitamin (MULTIVITAMIN WITH MINERALS) TABS tablet Take 1 tablet by mouth daily. Centrum Silver     Semaglutide, 1 MG/DOSE, (OZEMPIC, 1 MG/DOSE,) 2 MG/1.5ML SOPN Inject 1 mg as directed once a week.     simvastatin (ZOCOR) 20 MG tablet Take 20 mg by mouth at bedtime.      spironolactone (ALDACTONE) 25 MG tablet TAKE ONE TABLET BY MOUTH EVERY EVENING 90 tablet 4   TRUE METRIX BLOOD GLUCOSE TEST test strip 1 each by Other route as directed.   5   TRUEPLUS LANCETS 30G MISC 1 each by Other route as directed. Use as directed.  5   UNIFINE PENTIPS 32G X 4 MM MISC 1 each by Other route as directed.      losartan (COZAAR) 50 MG tablet Take 1.5 tablets (75 mg total) by mouth 2 (two) times daily. 100 tablet 3   No current facility-administered medications for this encounter.   BP 104/60   Pulse 93   Wt 60.4 kg (133 lb 3.2 oz)   SpO2 99%   BMI 22.17 kg/m  General: NAD Neck: No JVD, no thyromegaly  or thyroid nodule.  Lungs: Clear to auscultation bilaterally with normal respiratory effort. CV: Nondisplaced PMI.  Heart regular S1/S2, no S3/S4, no murmur.  No peripheral edema.  No carotid bruit.  Normal pedal pulses.  Abdomen: Soft, nontender, no hepatosplenomegaly, no distention.  Skin:  Intact without lesions or rashes.  Neurologic: Alert and oriented x 3.  Psych: Normal affect. Extremities: No clubbing or cyanosis.  HEENT: Normal.   Assessment/Plan: 1. Chronic systolic CHF: Nonischemic cardiomyopathy by 12/19 cath.  Cardiac MRI with LV EF 14%, RV EF 17%. No definite evidence for myocarditis or infiltrative disease by delayed enhancement images.  Most likely cause of cardiomyopathy is familial versus prior viral myocarditis.  Brother also had a cardiomyopathy of uncertain etiology.  Genetic testing was done, showing an LMNA gene variant of uncertain significance => she saw Dr. Jomarie Longs, suspect benign/uninvolved variant (no conduction abnormality).  CPX in 2/20 showed only mild HF limitation.  Echo in 6/20 showed EF 25-30%. Echo in 10/21 showed EF up some to 40%.  She now has a Environmental manager ICD.  She was started on Taxol/Herceptin chemotherapy for breast cancer and 11/21 echo showed fall in EF to 30%.  It was decided that she would not be a candidate for ongoing Herceptin and this medication was stopped.  Echo in 1/22 showed EF 25-30%, diffuse hypokinesis, mildly decreased RV systolic function.  CPX in 3/22 showed mild HF limitation.  Echo 3/23 showed EF remains 20-25%.  On exam and by Heartlogic, she is not volume overloaded.  NYHA class I-II symptoms, overall feels well.  Narrow QRS, not CRT candidate.  - Continue Coreg 25 mg bid.   - Increase losartan to 75 mg bid (No Entresto with history of ACEI angioedema). BMET/BNP today and in 10 days.   - Continue Jardiance.   - Continue spironolactone 25 mg daily.  - Continue digoxin 0.625 mg daily, check level.  - Continue hydralazine 75 mg tid + isordil 40 mg tid.  - Continue Lasix 20 mg PRN. - Do not think she is symptomatic enough at this time for barostimulator activation therapy or cardiac contractility modulator. .  - I will arrange for CPX.  - Repeat echo at followup.  2. Type II diabetes: She is on Jardiance.  3. Breast  cancer: HER2+. She started Herceptin and Taxol chemotherapy.  EF fell to 30% and Herceptin was stopped.  I do not think Herceptin will be a good option for her going forwards.    Follow up in 4 months with echo  Marca Ancona  03/23/2023

## 2023-04-08 ENCOUNTER — Ambulatory Visit (INDEPENDENT_AMBULATORY_CARE_PROVIDER_SITE_OTHER): Payer: PPO

## 2023-04-08 DIAGNOSIS — I428 Other cardiomyopathies: Secondary | ICD-10-CM | POA: Diagnosis not present

## 2023-04-08 LAB — CUP PACEART REMOTE DEVICE CHECK
Battery Remaining Longevity: 144 mo
Battery Remaining Percentage: 100 %
Brady Statistic RV Percent Paced: 0 %
Date Time Interrogation Session: 20240520050100
HighPow Impedance: 60 Ohm
Implantable Lead Connection Status: 753985
Implantable Lead Implant Date: 20200730
Implantable Lead Location: 753860
Implantable Lead Model: 292
Implantable Lead Serial Number: 447014
Implantable Pulse Generator Implant Date: 20200730
Lead Channel Impedance Value: 420 Ohm
Lead Channel Pacing Threshold Amplitude: 1.7 V
Lead Channel Pacing Threshold Pulse Width: 0.4 ms
Lead Channel Setting Pacing Amplitude: 3.5 V
Lead Channel Setting Pacing Pulse Width: 0.4 ms
Lead Channel Setting Sensing Sensitivity: 0.5 mV
Pulse Gen Serial Number: 266091
Zone Setting Status: 755011

## 2023-04-09 ENCOUNTER — Ambulatory Visit (HOSPITAL_COMMUNITY): Payer: PPO | Attending: Cardiology

## 2023-04-09 ENCOUNTER — Telehealth (HOSPITAL_COMMUNITY): Payer: Self-pay

## 2023-04-09 DIAGNOSIS — E119 Type 2 diabetes mellitus without complications: Secondary | ICD-10-CM | POA: Insufficient documentation

## 2023-04-09 DIAGNOSIS — E785 Hyperlipidemia, unspecified: Secondary | ICD-10-CM | POA: Diagnosis not present

## 2023-04-09 DIAGNOSIS — I5022 Chronic systolic (congestive) heart failure: Secondary | ICD-10-CM | POA: Diagnosis not present

## 2023-04-09 DIAGNOSIS — R0609 Other forms of dyspnea: Secondary | ICD-10-CM | POA: Diagnosis not present

## 2023-04-09 DIAGNOSIS — Z7984 Long term (current) use of oral hypoglycemic drugs: Secondary | ICD-10-CM | POA: Diagnosis not present

## 2023-04-09 DIAGNOSIS — Z79899 Other long term (current) drug therapy: Secondary | ICD-10-CM | POA: Diagnosis not present

## 2023-04-09 DIAGNOSIS — I110 Hypertensive heart disease with heart failure: Secondary | ICD-10-CM | POA: Insufficient documentation

## 2023-04-09 DIAGNOSIS — I10 Essential (primary) hypertension: Secondary | ICD-10-CM | POA: Diagnosis present

## 2023-04-09 NOTE — Telephone Encounter (Signed)
-----   Message from Laurey Morale, MD sent at 04/09/2023  1:14 PM EDT ----- Good news, minimal heart failure limitation

## 2023-04-09 NOTE — Telephone Encounter (Signed)
Patient aware of CPX results.

## 2023-04-17 DIAGNOSIS — E1169 Type 2 diabetes mellitus with other specified complication: Secondary | ICD-10-CM | POA: Diagnosis not present

## 2023-04-17 DIAGNOSIS — I10 Essential (primary) hypertension: Secondary | ICD-10-CM | POA: Diagnosis not present

## 2023-04-17 DIAGNOSIS — I5022 Chronic systolic (congestive) heart failure: Secondary | ICD-10-CM | POA: Diagnosis not present

## 2023-04-17 DIAGNOSIS — E785 Hyperlipidemia, unspecified: Secondary | ICD-10-CM | POA: Diagnosis not present

## 2023-05-03 NOTE — Progress Notes (Signed)
Remote ICD transmission.   

## 2023-05-25 ENCOUNTER — Other Ambulatory Visit (HOSPITAL_COMMUNITY): Payer: Self-pay | Admitting: Cardiology

## 2023-06-04 ENCOUNTER — Other Ambulatory Visit (HOSPITAL_COMMUNITY): Payer: Self-pay | Admitting: Cardiology

## 2023-07-08 ENCOUNTER — Ambulatory Visit (INDEPENDENT_AMBULATORY_CARE_PROVIDER_SITE_OTHER): Payer: PPO

## 2023-07-08 DIAGNOSIS — I428 Other cardiomyopathies: Secondary | ICD-10-CM

## 2023-07-08 DIAGNOSIS — I5022 Chronic systolic (congestive) heart failure: Secondary | ICD-10-CM | POA: Diagnosis not present

## 2023-07-08 LAB — CUP PACEART REMOTE DEVICE CHECK
Battery Remaining Longevity: 138 mo
Battery Remaining Percentage: 97 %
Brady Statistic RV Percent Paced: 0 %
Date Time Interrogation Session: 20240819050100
HighPow Impedance: 60 Ohm
Implantable Lead Connection Status: 753985
Implantable Lead Implant Date: 20200730
Implantable Lead Location: 753860
Implantable Lead Model: 292
Implantable Lead Serial Number: 447014
Implantable Pulse Generator Implant Date: 20200730
Lead Channel Impedance Value: 402 Ohm
Lead Channel Pacing Threshold Amplitude: 1.5 V
Lead Channel Pacing Threshold Pulse Width: 0.4 ms
Lead Channel Setting Pacing Amplitude: 3.5 V
Lead Channel Setting Pacing Pulse Width: 0.4 ms
Lead Channel Setting Sensing Sensitivity: 0.5 mV
Pulse Gen Serial Number: 266091
Zone Setting Status: 755011

## 2023-07-17 ENCOUNTER — Other Ambulatory Visit (HOSPITAL_COMMUNITY): Payer: Self-pay | Admitting: Cardiology

## 2023-07-17 DIAGNOSIS — E1165 Type 2 diabetes mellitus with hyperglycemia: Secondary | ICD-10-CM | POA: Diagnosis not present

## 2023-07-17 DIAGNOSIS — Z1331 Encounter for screening for depression: Secondary | ICD-10-CM | POA: Diagnosis not present

## 2023-07-17 DIAGNOSIS — E1169 Type 2 diabetes mellitus with other specified complication: Secondary | ICD-10-CM | POA: Diagnosis not present

## 2023-07-17 DIAGNOSIS — Z0000 Encounter for general adult medical examination without abnormal findings: Secondary | ICD-10-CM | POA: Diagnosis not present

## 2023-07-17 DIAGNOSIS — I5022 Chronic systolic (congestive) heart failure: Secondary | ICD-10-CM | POA: Diagnosis not present

## 2023-07-17 DIAGNOSIS — I429 Cardiomyopathy, unspecified: Secondary | ICD-10-CM | POA: Diagnosis not present

## 2023-07-17 DIAGNOSIS — E785 Hyperlipidemia, unspecified: Secondary | ICD-10-CM | POA: Diagnosis not present

## 2023-07-17 DIAGNOSIS — C50511 Malignant neoplasm of lower-outer quadrant of right female breast: Secondary | ICD-10-CM | POA: Diagnosis not present

## 2023-07-17 DIAGNOSIS — G620 Drug-induced polyneuropathy: Secondary | ICD-10-CM | POA: Diagnosis not present

## 2023-07-17 DIAGNOSIS — I10 Essential (primary) hypertension: Secondary | ICD-10-CM | POA: Diagnosis not present

## 2023-07-17 NOTE — Progress Notes (Signed)
Remote ICD transmission.   

## 2023-07-19 ENCOUNTER — Other Ambulatory Visit (HOSPITAL_COMMUNITY): Payer: Self-pay | Admitting: Cardiology

## 2023-07-23 ENCOUNTER — Ambulatory Visit (HOSPITAL_COMMUNITY)
Admission: RE | Admit: 2023-07-23 | Discharge: 2023-07-23 | Disposition: A | Discharge status: Home / Self Care | Payer: PPO | Source: Ambulatory Visit | Attending: Cardiology | Admitting: Cardiology

## 2023-07-23 ENCOUNTER — Encounter (HOSPITAL_COMMUNITY): Payer: Self-pay | Admitting: Cardiology

## 2023-07-23 ENCOUNTER — Ambulatory Visit (HOSPITAL_BASED_OUTPATIENT_CLINIC_OR_DEPARTMENT_OTHER)
Admission: RE | Admit: 2023-07-23 | Discharge: 2023-07-23 | Disposition: A | Discharge status: Home / Self Care | Payer: PPO | Source: Ambulatory Visit | Attending: Cardiology | Admitting: Cardiology

## 2023-07-23 VITALS — BP 130/70 | HR 81 | Wt 134.0 lb

## 2023-07-23 DIAGNOSIS — I2510 Atherosclerotic heart disease of native coronary artery without angina pectoris: Secondary | ICD-10-CM | POA: Diagnosis not present

## 2023-07-23 DIAGNOSIS — I110 Hypertensive heart disease with heart failure: Secondary | ICD-10-CM | POA: Diagnosis not present

## 2023-07-23 DIAGNOSIS — Z95810 Presence of automatic (implantable) cardiac defibrillator: Secondary | ICD-10-CM | POA: Diagnosis not present

## 2023-07-23 DIAGNOSIS — E785 Hyperlipidemia, unspecified: Secondary | ICD-10-CM | POA: Diagnosis not present

## 2023-07-23 DIAGNOSIS — I5022 Chronic systolic (congestive) heart failure: Secondary | ICD-10-CM | POA: Insufficient documentation

## 2023-07-23 DIAGNOSIS — R9431 Abnormal electrocardiogram [ECG] [EKG]: Secondary | ICD-10-CM | POA: Insufficient documentation

## 2023-07-23 DIAGNOSIS — T451X5A Adverse effect of antineoplastic and immunosuppressive drugs, initial encounter: Secondary | ICD-10-CM | POA: Insufficient documentation

## 2023-07-23 DIAGNOSIS — C50911 Malignant neoplasm of unspecified site of right female breast: Secondary | ICD-10-CM | POA: Insufficient documentation

## 2023-07-23 DIAGNOSIS — I428 Other cardiomyopathies: Secondary | ICD-10-CM | POA: Diagnosis not present

## 2023-07-23 DIAGNOSIS — Z794 Long term (current) use of insulin: Secondary | ICD-10-CM | POA: Insufficient documentation

## 2023-07-23 DIAGNOSIS — E119 Type 2 diabetes mellitus without complications: Secondary | ICD-10-CM | POA: Insufficient documentation

## 2023-07-23 LAB — BRAIN NATRIURETIC PEPTIDE: B Natriuretic Peptide: 56.2 pg/mL (ref 0.0–100.0)

## 2023-07-23 LAB — BASIC METABOLIC PANEL
Anion gap: 9 (ref 5–15)
BUN: 18 mg/dL (ref 8–23)
CO2: 26 mmol/L (ref 22–32)
Calcium: 10.2 mg/dL (ref 8.9–10.3)
Chloride: 104 mmol/L (ref 98–111)
Creatinine, Ser: 0.76 mg/dL (ref 0.44–1.00)
GFR, Estimated: >60 mL/min (ref >60)
Glucose, Bld: 122 mg/dL — ABNORMAL HIGH (ref 70–99)
Potassium: 4.2 mmol/L (ref 3.5–5.1)
Sodium: 139 mmol/L (ref 135–145)

## 2023-07-23 LAB — ECHOCARDIOGRAM COMPLETE
AR max vel: 1.7 cm2
AV Area VTI: 1.65 cm2
AV Area mean vel: 1.68 cm2
AV Mean grad: 3 mmHg
AV Peak grad: 5.4 mmHg
Ao pk vel: 1.16 m/s
Area-P 1/2: 3.7 cm2
Est EF: 25
S' Lateral: 4.8 cm

## 2023-07-23 LAB — DIGOXIN LEVEL: Digoxin Level: 0.4 ng/mL — ABNORMAL LOW (ref 0.8–2.0)

## 2023-07-23 NOTE — Patient Instructions (Signed)
Medication Changes:  None, continue current medications  Lab Work:  Labs done today, your results will be available in MyChart, we will contact you for abnormal readings.  Special Instructions // Education:  Do the following things EVERYDAY: Weigh yourself in the morning before breakfast. Write it down and keep it in a log. Take your medicines as prescribed Eat low salt foods--Limit salt (sodium) to 2000 mg per day.  Stay as active as you can everyday Limit all fluids for the day to less than 2 liters   Follow-Up in: 4 months   At the [facility], you and your health needs are our priority. We have a designated team specialized in the treatment of Heart Failure. This Care Team includes your primary Heart Failure Specialized Cardiologist (physician), Advanced Practice Providers (APPs- Physician Assistants and Nurse Practitioners), and Pharmacist who all work together to provide you with the care you need, when you need it.   You may see any of the following providers on your designated Care Team at your next follow up:  Dr. Arvilla Meres Dr. Marca Ancona Dr. Marcos Eke, NP Robbie Lis, [place] [facility] [place], [place] Brynda Peon, NP Karle Plumber, PharmD   Please be sure to bring in all your medications bottles to every appointment.   Need to Contact us:  If you have any questions or concerns before your next appointment please send [place] a message through [place] or call our office at [phone].    TO LEAVE A MESSAGE FOR THE NURSE SELECT OPTION 2, PLEASE LEAVE A MESSAGE INCLUDING: YOUR NAME DATE OF BIRTH CALL BACK NUMBER REASON FOR CALL**this is important as we prioritize the call backs  YOU WILL RECEIVE A CALL BACK THE SAME DAY AS LONG AS YOU CALL BEFORE 4:00 PM

## 2023-07-23 NOTE — Progress Notes (Addendum)
PCP: Dr. Wynelle Link HF Cardiology: Dr. Shirlee Latch Oncology: Dr. Pamelia Hoit  Toni [name] is a 67 y.o. [employer] employee with a history of chronic systolic heart failure, due to nonischemic cardiomyopathy, HTN, DM, and hyperlipidemia.    Initially diagnosed with NICM in 1998 thought to be from HTN versus viral. Had cath in 1998 that was negative for coronary disease. EF at that time was 20% but EF recovered in 2012.    In October 2019, she had a cough/virus and she took OTC meds. Says she would feel better for a little while but then felt bad again. She has been working full time as Artist at [employer] and prior to admission in 12/19 she had noticed increased fatigue and dyspnea.  She presented to [facility] ED on 11/13/18 with increased shortness of breath. She was admitted and echo was completed showing EF had gone back down to 15%. She had RHC/LHC with nonobstructive CAD and relatively preserved cardiac output. She was diuresed in the hospital and discharged. CPX in 2/20 showed only mild HF limitation.   Given family history of cardiomyopathy, I sent genetic testing.  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  I had her see Dr. Jomarie Longs, we think that the variant may be benign and unrelated to her cardiomyopathy.  She has no history of conduction disturbance.   Echo in 6/20 showed that EF remains low at 25-30%, mild LV dilation, mildly decreased RV systolic function.  She had a Environmental manager ICD placed.   8/21 diagnosed with right breast cancer, ER+/PR-/HER2+.  She had right lumpectomy.   Echo in 10/21 showed EF 40% with mildly decreased RV systolic function. She has been getting Taxol + Herceptin.  Repeat echo in 11/21 showed EF 30% with mild LV dilation, mildly decreased RV systolic function. Herceptin was stopped and it was decided that she would be a poor candidate for it in the future.   Echo in 1/22 showed EF 25-30%, diffuse hypokinesis, mildly decreased RV systolic  function.  CPX in 3/22 showed mild HF limitation.   Echo 3/23 showed EF 20-25%, mildly decreased RV systolic function, mild MR.   CPX in 5/24 showed mild HF limitation.   Echo today showed EF 25%, mild LV dilation, normal RV, mild MR, IVC normal.    Today she returns for HF follow up. Rarely using Lasix.  Rare lightheadedness if she stands too fast.  No chest pain. No exertional dyspnea, no exercise limitations.  No orthopnea/PND.  Weight stable.  No palpitations.   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): Heartlogic score 0    ECG (personally reviewed): NSR, PVC, LVH, nonspecific T wave changes.   Labs (1/20): K 4, creatinine 0.67 => 0.74 Labs (3/20): K 4.1, creatinine 0.87 Labs (6/20): digoxin level 0.9 Labs (7/20): K 4.1, creatinine 0.74 Labs (8/20): digoxin 0.4 Labs (11/20): K 4.5, creatinine 0.65, digoxin 1.2 Labs (2/21): digoxin 0.4, K 4.2, creatinine 0.9 Labs (6/21): K 5.1, creatinine 0.83 Labs (9/21): K 4.6, creatinine 0.89 Labs (11/21): K 4, creatinine 0.78 Labs (1/22): K 4.4, creatinine 0.89 Labs (3/22): K 4.2, creatinine 0.72 Labs (7/22): K 4.8, creatinine 0.92, LDL 63 Labs (8/22): digoxin 0.3 Labs (11/22): K 4.9, creatinine 0.83 Labs (12/22): K 4, creatinine 0.8, digoxin 0.7 Labs (3/23): K 4.4, creatinine 0.79 Labs (7/23): K 4, creatinine 0.91, digoxin 0.6 Labs (5/24): K 4.5, creatinine 0.89, digoxin 0.4, BNP 50  PMH: 1. HTN 2. Type 2 diabetes 3. Hyperlipidemia 4. Chronic systolic  CHF: Nonischemic cardiomyopathy.  Diagnosed in 1998, EF 20% by echo at that time.  Echo back to normal range by 2012.   - LHC/RHC (12/19): D1 60-70% stenosis; mean RA 6, PA 58/22, mean PCWP 22, CI 2.9.  - Echo (12/19): EF 15% with severe LV dilation, moderate central MR likely functional.  - Cardiac MRI (12/19): Moderate LV dilation with EF 14%, mild RV dilation with EF 17%, LGE at the inferior RV insertion site (nonspecific).  - CPX (2/20): peak VO2 18.6, VE/VCO2  31, RER 1.18 => mild HF limitation.  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  Suspect the variant is benign.  - Echo (6/20): EF 25-30%, mild LV dilation, mildly decreased RV systolic function.  - Echo (10/21): EF 40%, diffuse hypokinesis, mildly decreased RV systolic function.  - Echo (11/21): EF 30%, diffuse hypokinesis, mildly decreased RV systolic function.  - Echo (1/22): EF 25-30%, diffuse hypokinesis, mildly decreased RV systolic function.  - CPX (3/22): Peak VO2 22, VE/VCO2 slope 33, RER 1.13.  Mild HF limitation.  - Echo (3/23): EF 20-25%, mildly decreased RV systolic function, mild MR.  - CPX (5/24): peak VO2 21.3, RER 1.14, VE/VCO2 slope 31.  Mild HF limitation.  - Echo (9/24): EF 25%, mild LV dilation, normal RV, mild MR, IVC normal. 5. Angioedema with ACEI 6. Left shoulder adhesive capsulitis 7. Depression 8. Breast cancer: 8/21 diagnosed with right breast cancer, ER+/PR-/HER2+.  She had right lumpectomy and radiation.   Social History   Socioeconomic History   Marital status: Single    Spouse name: Not on file   Number of children: Not on file   Years of education: 16   Highest education level: Bachelor's degree (e.g., BA, AB, BS)  Occupational History   Occupation: Special educational needs teacher: [employer]  Tobacco Use   Smoking status: Never   Smokeless tobacco: Never  Vaping Use   Vaping status: Never Used  Substance and Sexual Activity   Alcohol use: Yes    Alcohol/week: 0.0 standard drinks of alcohol    Comment: less than once a month   Drug use: No   Sexual activity: Never  Other Topics Concern   Not on file  Social History Narrative   Works at [employer].  Lives alone.     Social Determinants of Health   Financial Resource Strain: Low Risk  (07/06/2020)   Overall Financial Resource Strain (CARDIA)    Difficulty of Paying Living Expenses: Not hard at all  Food Insecurity: No Food Insecurity  (10/28/2020)   Hunger Vital Sign    Worried About Running Out of Food in the Last Year: Never true    Ran Out of Food in the Last Year: Never true  Transportation Needs: No Transportation Needs (10/28/2020)   PRAPARE - Administrator, Civil Service (Medical): No    Lack of Transportation (Non-Medical): No  Physical Activity: Inactive (11/03/2019)   Exercise Vital Sign    Days of Exercise per Week: 0 days    Minutes of Exercise per Session: 0 min  Stress: Stress Concern Present (11/03/2019)   [employer] of Occupational Health - Occupational Stress Questionnaire    Feeling of Stress : Very much  Social Connections: Not on file  Intimate Partner Violence: Not At Risk (12/06/2020)   Humiliation, Afraid, Rape, and Kick questionnaire    Fear of Current or Ex-Partner: No    Emotionally Abused: No  Physically Abused: No    Sexually Abused: No   Family History  Problem Relation Age of Onset   Diabetes Mellitus I Mother    Lung cancer Mother 57   Breast cancer Mother        dx. late 30s/early 34s   Diabetes Mellitus I Father    Sudden death Father 9   Prostate cancer Brother 78   Hypertension Brother    Hypertension Brother    Diabetes Mellitus I Brother    Benign prostatic hyperplasia Brother    Heart failure Paternal Uncle    Thyroid cancer Niece        dx. in her 30s   Breast cancer Cousin        dx. in her 10s, recurrence in her 110s (maternal first cousin)   Breast cancer Cousin        female dx. in his early 4s (paternal first cousin)   Cancer Cousin        dx. in his early 58s, unknown type (paternal first cousin)   Cancer Cousin 61       unknown type (paternal first cousin)   ROS: All systems reviewed and negative except as per HPI.   Current Outpatient Medications  Medication Sig Dispense Refill   acetaminophen (TYLENOL) 500 MG tablet Take 1,000 mg by mouth every 6 (six) hours as needed for moderate pain or headache.     anastrozole (ARIMIDEX) 1  MG tablet Take 1 tablet (1 mg total) by mouth daily. 90 tablet 3   aspirin 81 MG chewable tablet Chew 1 tablet (81 mg total) by mouth daily. 30 tablet 0   carvedilol (COREG) 25 MG tablet TAKE ONE TABLET BY MOUTH TWICE DAILY with meals 180 tablet 4   cetirizine (ZYRTEC) 10 MG tablet Take 10 mg by mouth at bedtime.     Continuous Blood Gluc Receiver (FREESTYLE LIBRE 14 DAY READER) DEVI Apply topically as directed.     digoxin (LANOXIN) 0.125 MG tablet TAKE 1/2 TABLET BY MOUTH ONCE DAILY 45 tablet 3   furosemide (LASIX) 20 MG tablet Take 20 mg by mouth as needed.     glipiZIDE (GLUCOTROL XL) 10 MG 24 hr tablet Take 20 mg by mouth daily with breakfast.      hydrALAZINE (APRESOLINE) 50 MG tablet TAKE 1 AND 1/2 TABLETS BY MOUTH THREE TIMES DAILY 405 tablet 3   isosorbide dinitrate (ISORDIL) 20 MG tablet TAKE TWO TABLETS BY MOUTH THREE TIMES DAILY 540 tablet 3   JARDIANCE 25 MG TABS tablet Take 25 mg by mouth daily.     metFORMIN (GLUCOPHAGE) 1000 MG tablet Take 1,000 mg by mouth 2 (two) times daily with a meal.     Multiple Vitamin (MULTIVITAMIN WITH MINERALS) TABS tablet Take 1 tablet by mouth daily. Centrum Silver     Semaglutide, 1 MG/DOSE, (OZEMPIC, 1 MG/DOSE,) 2 MG/1.5ML SOPN Inject 1 mg as directed once a week.     simvastatin (ZOCOR) 20 MG tablet TAKE 1 TABLET BY MOUTH ONCE DAILY *REFILL REQUEST* 90 tablet 0   spironolactone (ALDACTONE) 25 MG tablet TAKE ONE TABLET BY MOUTH EVERY EVENING 90 tablet 4   TRUE METRIX BLOOD GLUCOSE TEST test strip 1 each by Other route as directed.   5   TRUEPLUS LANCETS 30G MISC 1 each by Other route as directed. Use as directed.  5   UNIFINE PENTIPS 32G X 4 MM MISC 1 each by Other route as directed.      losartan (COZAAR) 50  MG tablet TAKE 1 & 1/2 TABLETS BY MOUTH TWICE DAILY 90 tablet 3   No current facility-administered medications for this encounter.   BP 130/70   Pulse 81   Wt 60.8 kg (134 lb)   SpO2 98%   BMI 22.30 kg/m  General: NAD Neck: No JVD, no  thyromegaly or thyroid nodule.  Lungs: Clear to auscultation bilaterally with normal respiratory effort. CV: Nondisplaced PMI.  Heart regular S1/S2, no S3/S4, no murmur.  No peripheral edema.  No carotid bruit.  Normal pedal pulses.  Abdomen: Soft, nontender, no hepatosplenomegaly, no distention.  Skin: Intact without lesions or rashes.  Neurologic: Alert and oriented x 3.  Psych: Normal affect. Extremities: No clubbing or cyanosis.  HEENT: Normal. '  Assessment/Plan: 1. Chronic systolic CHF: Nonischemic cardiomyopathy by 12/19 cath.  Cardiac MRI with LV EF 14%, RV EF 17%. No definite evidence for myocarditis or infiltrative disease by delayed enhancement images.  Most likely cause of cardiomyopathy is familial versus prior viral myocarditis.  Brother also had a cardiomyopathy of uncertain etiology.  Genetic testing was done, showing an LMNA gene variant of uncertain significance => she saw Dr. Jomarie Longs, suspect benign/uninvolved variant (no conduction abnormality).  CPX in 2/20 showed only mild HF limitation.  Echo in 6/20 showed EF 25-30%. Echo in 10/21 showed EF up some to 40%.  She now has a Environmental manager ICD.  She was started on Taxol/Herceptin chemotherapy for breast cancer and 11/21 echo showed fall in EF to 30%.  It was decided that she would not be a candidate for ongoing Herceptin and this medication was stopped.  Echo in 1/22 showed EF 25-30%, diffuse hypokinesis, mildly decreased RV systolic function.  CPX in 3/22 showed mild HF limitation.  Echo 3/23 showed EF remains 20-25%.  Repeat CPX in 5/24 again showed mild HF limitation.  Echo today showed EF 25%, mild LV dilation, normal RV, mild MR, IVC normal.  On exam and by [facility], she is not volume overloaded.  NYHA class I symptoms, overall feels well.  Narrow QRS, not CRT candidate.  - Continue Coreg 25 mg bid.   - Continue losartan 75 mg bid (No Entresto with history of ACEI angioedema). BMET/BNP today.   - Continue Jardiance.   -  Continue spironolactone 25 mg daily.  - Continue digoxin 0.625 mg daily, check level.  - Continue hydralazine 75 mg tid + isordil 40 mg tid.  - Continue Lasix 20 mg PRN. - Do not think she is symptomatic enough at this time for barostimulator activation therapy or cardiac contractility modulator.  2. Type II diabetes: She is on Jardiance.  3. Breast cancer: HER2+. She started Herceptin and Taxol chemotherapy.  EF fell to 30% and Herceptin was stopped.  I do not think Herceptin will be a good option for her going forwards.    Follow up in 4 months with APP  Marca Ancona  07/23/2023

## 2023-08-01 ENCOUNTER — Encounter: Payer: Self-pay | Admitting: Cardiology

## 2023-08-14 ENCOUNTER — Other Ambulatory Visit (HOSPITAL_COMMUNITY): Payer: Self-pay

## 2023-08-15 ENCOUNTER — Other Ambulatory Visit (HOSPITAL_COMMUNITY): Payer: Self-pay

## 2023-08-15 MED ORDER — GLIPIZIDE ER 10 MG PO TB24
20.0000 mg | ORAL_TABLET | Freq: Every day | ORAL | 3 refills | Status: DC
  Filled 2023-09-23: qty 60, 30d supply, fill #0
  Filled 2023-10-19: qty 60, 30d supply, fill #1
  Filled 2023-11-19: qty 60, 30d supply, fill #2
  Filled 2023-12-23: qty 60, 30d supply, fill #3

## 2023-08-15 MED ORDER — LOSARTAN POTASSIUM 50 MG PO TABS
75.0000 mg | ORAL_TABLET | Freq: Two times a day (BID) | ORAL | 3 refills | Status: DC
  Filled 2023-08-29 – 2023-09-02 (×2): qty 90, 30d supply, fill #0
  Filled 2023-10-03 – 2023-10-04 (×2): qty 90, 30d supply, fill #1
  Filled 2023-10-19 – 2023-10-31 (×2): qty 90, 30d supply, fill #2
  Filled 2023-11-28: qty 90, 30d supply, fill #3

## 2023-08-15 MED ORDER — ISOSORBIDE DINITRATE 20 MG PO TABS
40.0000 mg | ORAL_TABLET | Freq: Three times a day (TID) | ORAL | 2 refills | Status: DC
  Filled 2023-10-19: qty 540, 90d supply, fill #0
  Filled 2024-02-10: qty 540, 90d supply, fill #1
  Filled 2024-05-19: qty 540, 90d supply, fill #2

## 2023-08-15 MED ORDER — HYDRALAZINE HCL 50 MG PO TABS
75.0000 mg | ORAL_TABLET | Freq: Three times a day (TID) | ORAL | 0 refills | Status: DC
  Filled 2023-10-19: qty 405, 90d supply, fill #0
  Filled 2024-02-10: qty 405, 90d supply, fill #1
  Filled 2024-05-17: qty 405, 90d supply, fill #2

## 2023-08-15 MED ORDER — CARVEDILOL 25 MG PO TABS
25.0000 mg | ORAL_TABLET | Freq: Two times a day (BID) | ORAL | 1 refills | Status: DC
  Filled 2023-09-23: qty 180, 90d supply, fill #0

## 2023-08-15 MED ORDER — ANASTROZOLE 1 MG PO TABS: 1.0000 mg | ORAL_TABLET | Freq: Every day | ORAL | 0 refills | Status: DC

## 2023-08-15 MED ORDER — FREESTYLE LIBRE 14 DAY SENSOR MISC
1.0000 | 6 refills | Status: AC
  Filled 2023-09-03: qty 2, 28d supply, fill #0
  Filled 2023-10-04: qty 2, 28d supply, fill #1
  Filled 2023-10-19 – 2023-10-31 (×2): qty 2, 28d supply, fill #2
  Filled 2023-11-27: qty 2, 28d supply, fill #3
  Filled 2023-12-23: qty 2, 28d supply, fill #4
  Filled 2024-01-20: qty 2, 28d supply, fill #5
  Filled 2024-02-18: qty 2, 28d supply, fill #6

## 2023-08-15 MED ORDER — DIGOXIN 125 MCG PO TABS
0.0625 mg | ORAL_TABLET | Freq: Every day | ORAL | 2 refills | Status: DC
  Filled 2023-09-23: qty 45, 90d supply, fill #0
  Filled 2023-12-23: qty 45, 90d supply, fill #1
  Filled 2024-03-26: qty 45, 90d supply, fill #2

## 2023-08-15 MED ORDER — METFORMIN HCL 1000 MG PO TABS
1000.0000 mg | ORAL_TABLET | Freq: Two times a day (BID) | ORAL | 6 refills | Status: DC
Start: 2023-07-17 — End: 2024-08-11
  Filled 2023-09-23: qty 60, 30d supply, fill #0
  Filled 2023-10-31: qty 60, 30d supply, fill #1
  Filled 2023-11-27: qty 60, 30d supply, fill #2
  Filled 2023-12-26: qty 60, 30d supply, fill #3
  Filled 2024-02-10 – 2024-04-06 (×6): qty 60, 30d supply, fill #4
  Filled 2024-05-17: qty 60, 30d supply, fill #5
  Filled 2024-07-12: qty 60, 30d supply, fill #6

## 2023-08-15 MED ORDER — SPIRONOLACTONE 25 MG PO TABS
25.0000 mg | ORAL_TABLET | Freq: Every evening | ORAL | 1 refills | Status: DC
  Filled 2023-09-23: qty 90, 90d supply, fill #0
  Filled 2023-10-19: qty 90, 90d supply, fill #1

## 2023-08-15 MED FILL — Simvastatin Tab 20 MG: ORAL | 90 days supply | Qty: 90 | Fill #0 | Status: CN

## 2023-08-19 ENCOUNTER — Other Ambulatory Visit (HOSPITAL_COMMUNITY): Payer: Self-pay

## 2023-08-29 ENCOUNTER — Other Ambulatory Visit (HOSPITAL_COMMUNITY): Payer: Self-pay

## 2023-09-02 ENCOUNTER — Other Ambulatory Visit (HOSPITAL_COMMUNITY): Payer: Self-pay

## 2023-09-03 ENCOUNTER — Other Ambulatory Visit (HOSPITAL_COMMUNITY): Payer: Self-pay

## 2023-09-19 ENCOUNTER — Inpatient Hospital Stay: Payer: PPO | Attending: Hematology and Oncology | Admitting: Hematology and Oncology

## 2023-09-19 ENCOUNTER — Other Ambulatory Visit (HOSPITAL_COMMUNITY): Payer: Self-pay

## 2023-09-19 VITALS — BP 101/57 | HR 88 | Temp 97.5°F | Resp 18 | Ht 65.0 in | Wt 132.8 lb

## 2023-09-19 DIAGNOSIS — Z79811 Long term (current) use of aromatase inhibitors: Secondary | ICD-10-CM | POA: Diagnosis not present

## 2023-09-19 DIAGNOSIS — Z923 Personal history of irradiation: Secondary | ICD-10-CM | POA: Insufficient documentation

## 2023-09-19 DIAGNOSIS — Z9221 Personal history of antineoplastic chemotherapy: Secondary | ICD-10-CM | POA: Insufficient documentation

## 2023-09-19 DIAGNOSIS — Z170 Estrogen receptor positive status [ER+]: Secondary | ICD-10-CM | POA: Insufficient documentation

## 2023-09-19 DIAGNOSIS — C50511 Malignant neoplasm of lower-outer quadrant of right female breast: Secondary | ICD-10-CM | POA: Diagnosis not present

## 2023-09-19 MED ORDER — ANASTROZOLE 1 MG PO TABS
1.0000 mg | ORAL_TABLET | Freq: Every day | ORAL | 3 refills | Status: DC
  Filled 2023-09-19: qty 90, 90d supply, fill #0
  Filled 2023-10-19 – 2023-12-23 (×2): qty 90, 90d supply, fill #1
  Filled 2024-03-26: qty 90, 90d supply, fill #2
  Filled 2024-06-28: qty 90, 90d supply, fill #3

## 2023-09-19 NOTE — Research (Signed)
TRIAL [phone] - A Prospective Observational Cohort Study to Develop a Predictive Model of Taxane-Induced Peripheral Neuropathy in Cancer Patients   Patient arrives today Unaccompanied for the 156 week visit.    PROs: Per study protocol, all PROs required for this visit were completed prior to other study activities and completeness has been verified.    MD/PROVIDER VISIT: Patient sees Dr Pamelia Hoit for today's visit.   ADVERSE EVENTS: Patient Toni [name] reports AE as below. Attribution per Dr Pamelia Hoit.  ADVERSE EVENT LOGBRADLEE [name] [phone]  09/19/2023  Adverse Event Log  Study/Protocol: [id number]  Cycle: Week 156  Event Grade Onset Date Resolved Date Drug Name Attribution Treatment Comments  Peripheral sensory neuropathy Grade 1 05/19/2023  paclitaxel Definite None Only occasional numbness to 3 fingers on left hand                                                GIFT CARD: This study does not provide visit compensation.   DISPOSITION: Upon completion off all study requirements, patient remained in the exam room to see Dr Pamelia Hoit.   The patient was thanked for their time and voluntary participation in this study. Patient Toni [name] has been provided direct contact information and is encouraged to contact this Nurse for any needs or questions. She has now completed all study activities. Encouraged her to call if we can be of any assistance in the future.    Margret Chance Omar Gayden, RN, BSN, [facility]  Her  [facility] Clinical Research Nurse [facility] Direct Dial [phone]  Pager [phone] 09/19/2023 10:52 AM

## 2023-09-19 NOTE — Assessment & Plan Note (Addendum)
07/01/2020:Screening mammogram showed a right breast mass. Mammogram and US showed a 0.7cm mass at the 6 o'clock position in the right breast, no axillary adenopathy. 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  T1BN0 stage Ia   08/11/2020:Right lumpectomy Toni [name]): IDC, grade 3, 0.8cm, clear margins, 3 right axillary lymph nodes negative for carcinoma.  ER 30% week, PR 0%, HER-2 positive, Ki-67 40%   Treatment plan: 1. adjuvant Taxol Herceptin/Herceptin discontinued after 5 cycles due to worsening cardiomyopathy, Taxol completed 11/24/20 2.  Adjuvant radiation therapy completed 01/11/21 3.  Follow-up adjuvant antiestrogen therapy Patient is participating in the neuropathy clinical trial [id number] --------------------------------------------------------------------------------------------------------------------------------------- Current treatment: Anti estrogen therapy with anastrozole 01/13/2021   She has a grade 1 peripheral sensory neuropathy.   echocardiogram on 12/20/2020. EF 25-30%: She follows with Dr. Sherlie Ban.  Herceptin discontinued   Anastrozole toxicities: Hot flashes: But they are not bothering her significantly Joint stiffness: Resolved after taking it at bedtime   Breast cancer surveillance: 1.  Breast exam 09/19/2023: Benign 2. mammogram and bone density 12/11/2022: Benign breast density category B,  bone density T score -0.4: Normal   RTC in 1 year for follow-up

## 2023-09-19 NOTE — Progress Notes (Signed)
Patient Care Team: Deatra James, MD as PCP - General (Family Medicine) Lyn Records, MD (Inactive) as PCP - Cardiology (Cardiology) Laurey Morale, MD as PCP - Advanced Heart Failure (Cardiology) Abigail Miyamoto, MD as Consulting Physician (General Surgery) Serena Croissant, MD as Consulting Physician (Hematology and Oncology) Dorothy Puffer, MD as Consulting Physician (Radiation Oncology)  DIAGNOSIS:  Encounter Diagnosis  Name Primary?   Malignant neoplasm of lower-outer quadrant of right breast of female, estrogen receptor positive ([name]) Yes    SUMMARY OF ONCOLOGIC HISTORY: Oncology History  Malignant neoplasm of lower-outer quadrant of right breast of female, estrogen receptor positive ([name])  07/01/2020 Initial Diagnosis   Screening mammogram showed a right breast mass. Mammogram and US showed a 0.7cm mass at the 6 o'clock position in the right breast, no axillary adenopathy. 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    07/06/2020 Cancer Staging   Staging form: Breast, AJCC 8th Edition - Clinical stage from 07/06/2020: Stage IB (cT1b, cN0, cM0, G3, ER+, PR-, HER2-)   07/26/2020 Genetic Testing   Negative genetic testing:  No pathogenic variants detected on the [facility] Common Hereditary Cancers Panel. The report date is 07/26/2020.   The Common Hereditary Cancers Panel offered by [facility] includes sequencing and/or deletion duplication testing of the following 48 genes: APC, ATM, AXIN2, BARD1, BMPR1A, BRCA1, BRCA2, BRIP1, CDH1, CDK4, CDKN2A (p14ARF), CDKN2A (p16INK4a), CHEK2, CTNNA1, DICER1, EPCAM (Deletion/duplication testing only), GREM1 (promoter region deletion/duplication testing only), KIT, MEN1, MLH1, MSH2, MSH3, MSH6, MUTYH, NBN, NF1, NTHL1, PALB2, PDGFRA, PMS2, POLD1, POLE, PTEN, RAD50, RAD51C, RAD51D, RNF43, SDHB, SDHC, SDHD, SMAD4, SMARCA4. STK11, TP53, TSC1, TSC2, and VHL.  The following genes were  evaluated for sequence changes only: SDHA and HOXB13 c.251G>A variant only.   08/11/2020 Surgery   Right lumpectomy Magnus Ivan) [id number]): IDC, grade 3, 0.8cm, clear margins, 3 right axillary lymph nodes negative for carcinoma. ER 30% weak, PR 0%, HER-2 positive, Ki-67 40%   08/18/2020 Cancer Staging   Staging form: Breast, AJCC 8th Edition - Pathologic stage from 08/18/2020: Stage IA (pT1b, pN0, cM0, G3, ER+, PR-, HER2+)   09/08/2020 -  Adjuvant Chemotherapy   Weekly Taxol/herceptin x 12 (09/08/2020 - 11/24/2020). Herceptin was stopped after first 5 weekly cycles (last received on 10/06/2020) due to decline in EF from 40% to 30%.     12/15/2020 - 01/11/2021 Radiation Therapy   The patient initially received a dose of 42.56 Gy in 16 fractions to the breast using whole-breast tangent fields. This was delivered using a 3-D conformal technique. The pt received a boost delivering an additional 8 Gy in 4 fractions using a electron boost with electrons. The total dose was 50.56 Gy.   12/2020 -  Anti-estrogen oral therapy   Anastrozole     CHIEF COMPLIANT: Follow-up on anastrozole therapy  History of Present Illness   The patient, with a history of breast cancer, presents for a routine follow-up. She has been on anastrozole for at least a year, experiencing mild hot flashes which improved with evening dosing. She denies joint stiffness or achiness. Her bone density has been excellent, comparable to an 68 year old. She has been compliant with annual mammograms, with the next one due in January. She also reports a change in her losartan dosage to one and a half tablets, but no other changes to her medication list. She has been maintaining a healthy weight, with a significant weight loss from 165  lbs to around 130 lbs.       ALLERGIES:  is allergic to biaxin [clarithromycin] and vasotec [enalapril].  MEDICATIONS:  Current Outpatient Medications  Medication Sig Dispense Refill    acetaminophen (TYLENOL) 500 MG tablet Take 1,000 mg by mouth every 6 (six) hours as needed for moderate pain or headache.     anastrozole (ARIMIDEX) 1 MG tablet Take 1 tablet (1 mg total) by mouth daily. 90 tablet 3   aspirin 81 MG chewable tablet Chew 1 tablet (81 mg total) by mouth daily. 30 tablet 0   carvedilol (COREG) 25 MG tablet TAKE ONE TABLET BY MOUTH TWICE DAILY with meals 180 tablet 4   carvedilol (COREG) 25 MG tablet Take 1 tablet (25 mg total) by mouth 2 (two) times daily with a meal. 180 tablet 1   cetirizine (ZYRTEC) 10 MG tablet Take 10 mg by mouth at bedtime.     Continuous Blood Gluc Receiver (FREESTYLE LIBRE 14 DAY READER) DEVI Apply topically as directed.     Continuous Glucose Sensor (FREESTYLE LIBRE 14 DAY SENSOR) MISC Use 1 sensor to monitor blood sugar levels change every 14 (fourteen) days. 2 each 6   digoxin (LANOXIN) 0.125 MG tablet TAKE 1/2 TABLET BY MOUTH ONCE DAILY 45 tablet 3   digoxin (LANOXIN) 0.125 MG tablet Take 0.5 tablets (0.0625 mg total) by mouth daily. 45 tablet 2   furosemide (LASIX) 20 MG tablet Take 20 mg by mouth as needed.     glipiZIDE (GLUCOTROL XL) 10 MG 24 hr tablet Take 20 mg by mouth daily with breakfast.      glipiZIDE (GLUCOTROL XL) 10 MG 24 hr tablet Take 2 tablets (20 mg total) by mouth daily. 60 tablet 3   hydrALAZINE (APRESOLINE) 50 MG tablet TAKE 1 AND 1/2 TABLETS BY MOUTH THREE TIMES DAILY 405 tablet 3   hydrALAZINE (APRESOLINE) 50 MG tablet Take 1.5 tablets (75 mg total) by mouth 3 (three) times daily. 1215 tablet 0   isosorbide dinitrate (ISORDIL) 20 MG tablet TAKE TWO TABLETS BY MOUTH THREE TIMES DAILY 540 tablet 3   isosorbide dinitrate (ISORDIL) 20 MG tablet Take 2 tablets (40 mg total) by mouth 3 (three) times daily. 540 tablet 2   JARDIANCE 25 MG TABS tablet Take 25 mg by mouth daily.     losartan (COZAAR) 50 MG tablet TAKE 1 & 1/2 TABLETS BY MOUTH TWICE DAILY 90 tablet 3   losartan (COZAAR) 50 MG tablet Take 1.5 tablets (75 mg  total) by mouth 2 (two) times daily. 90 tablet 3   metFORMIN (GLUCOPHAGE) 1000 MG tablet Take 1,000 mg by mouth 2 (two) times daily with a meal.     metFORMIN (GLUCOPHAGE) 1000 MG tablet Take 1 tablet (1,000 mg total) by mouth 2 (two) times daily. 60 tablet 6   Multiple Vitamin (MULTIVITAMIN WITH MINERALS) TABS tablet Take 1 tablet by mouth daily. Centrum Silver     Semaglutide, 1 MG/DOSE, (OZEMPIC, 1 MG/DOSE,) 2 MG/1.5ML SOPN Inject 1 mg as directed once a week.     simvastatin (ZOCOR) 20 MG tablet Take 1 tablet (20 mg total) by mouth daily. 90 tablet 0   spironolactone (ALDACTONE) 25 MG tablet TAKE ONE TABLET BY MOUTH EVERY EVENING 90 tablet 4   spironolactone (ALDACTONE) 25 MG tablet Take 1 tablet (25 mg total) by mouth every evening. 90 tablet 1   TRUE METRIX BLOOD GLUCOSE TEST test strip 1 each by Other route as directed.   5   TRUEPLUS LANCETS 30G  MISC 1 each by Other route as directed. Use as directed.  5   UNIFINE PENTIPS 32G X 4 MM MISC 1 each by Other route as directed.      No current facility-administered medications for this visit.    PHYSICAL EXAMINATION: ECOG PERFORMANCE STATUS: 1 - Symptomatic but completely ambulatory  Vitals:   09/19/23 0912  BP: (!) 101/57  Pulse: 88  Resp: 18  Temp: (!) 97.5 F (36.4 C)  SpO2: 100%   Filed Weights   09/19/23 0912  Weight: 132 lb 12.8 oz (60.2 kg)    Physical Exam   MEASUREMENTS: WT- 132 pounds. Self-reported weight approximately 129 to 130 pounds.        LABORATORY DATA:  I have reviewed the data as listed    Latest Ref Rng & Units 07/23/2023   10:17 AM 03/22/2023    9:42 AM 05/30/2022   10:04 AM  CMP  Glucose 70 - 99 mg/dL 106  269  485   BUN 8 - 23 mg/dL 18  14  19    Creatinine 0.44 - 1.00 mg/dL 4.62  7.03  5.00   Sodium 135 - 145 mmol/L 139  138  139   Potassium 3.5 - 5.1 mmol/L 4.2  4.5  4.0   Chloride 98 - 111 mmol/L 104  103  103   CO2 22 - 32 mmol/L 26  27  24    Calcium 8.9 - 10.3 mg/dL 93.8  9.7  9.9      Lab Results  Component Value Date   WBC 5.5 01/27/2021   HGB 14.1 01/27/2021   HCT 44.9 01/27/2021   MCV 95.5 01/27/2021   PLT 265 01/27/2021   NEUTROABS 2.3 11/24/2020    ASSESSMENT & PLAN:  Malignant neoplasm of lower-outer quadrant of right breast of female, estrogen receptor positive ([name]) 07/01/2020:Screening mammogram showed a right breast mass. Mammogram and US showed a 0.7cm mass at the 6 o'clock position in the right breast, no axillary adenopathy. 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  T1BN0 stage Ia   08/11/2020:Right lumpectomy Magnus Ivan): IDC, grade 3, 0.8cm, clear margins, 3 right axillary lymph nodes negative for carcinoma.  ER 30% week, PR 0%, HER-2 positive, Ki-67 40%   Treatment plan: 1. adjuvant Taxol Herceptin/Herceptin discontinued after 5 cycles due to worsening cardiomyopathy, Taxol completed 11/24/20 2.  Adjuvant radiation therapy completed 01/11/21 3.  Follow-up adjuvant antiestrogen therapy Patient is participating in the neuropathy clinical trial SWOG [id number] --------------------------------------------------------------------------------------------------------------------------------------- Current treatment: Anti estrogen therapy with anastrozole 01/13/2021   She has a grade 1 peripheral sensory neuropathy.   echocardiogram on 12/20/2020. EF 25-30%: She follows with Dr. Sherlie Ban.  Herceptin discontinued   Anastrozole toxicities: Hot flashes: But they are not bothering her significantly Joint stiffness: Resolved after taking it at bedtime   Breast cancer surveillance: 1.  Breast exam 09/19/2023: Benign 2. mammogram and bone density 12/11/2022: Benign breast density category B,  bone density T score -0.4: Normal  General Health Maintenance Significant weight loss since last visit. -Encourage continued healthy lifestyle habits.     RTC in 1 year for  follow-up     No orders of the defined types were placed in this encounter.  The patient has a good understanding of the overall plan. she agrees with it. she will call with any problems that may develop before the next visit here. Total time spent: 30 mins including face to face  time and time spent for planning, charting and co-ordination of care   Tamsen Meek, MD 09/19/23

## 2023-09-23 ENCOUNTER — Other Ambulatory Visit (HOSPITAL_COMMUNITY): Payer: Self-pay

## 2023-09-23 MED FILL — Simvastatin Tab 20 MG: ORAL | 90 days supply | Qty: 90 | Fill #0 | Status: AC

## 2023-10-04 ENCOUNTER — Other Ambulatory Visit: Payer: Self-pay

## 2023-10-04 ENCOUNTER — Other Ambulatory Visit (HOSPITAL_COMMUNITY): Payer: Self-pay

## 2023-10-07 ENCOUNTER — Ambulatory Visit (INDEPENDENT_AMBULATORY_CARE_PROVIDER_SITE_OTHER): Payer: PPO

## 2023-10-07 DIAGNOSIS — I428 Other cardiomyopathies: Secondary | ICD-10-CM

## 2023-10-07 DIAGNOSIS — I5022 Chronic systolic (congestive) heart failure: Secondary | ICD-10-CM | POA: Diagnosis not present

## 2023-10-09 DIAGNOSIS — H52223 Regular astigmatism, bilateral: Secondary | ICD-10-CM | POA: Diagnosis not present

## 2023-10-09 DIAGNOSIS — H524 Presbyopia: Secondary | ICD-10-CM | POA: Diagnosis not present

## 2023-10-09 DIAGNOSIS — H40013 Open angle with borderline findings, low risk, bilateral: Secondary | ICD-10-CM | POA: Diagnosis not present

## 2023-10-09 DIAGNOSIS — H43811 Vitreous degeneration, right eye: Secondary | ICD-10-CM | POA: Diagnosis not present

## 2023-10-09 DIAGNOSIS — H5213 Myopia, bilateral: Secondary | ICD-10-CM | POA: Diagnosis not present

## 2023-10-09 DIAGNOSIS — E119 Type 2 diabetes mellitus without complications: Secondary | ICD-10-CM | POA: Diagnosis not present

## 2023-10-09 DIAGNOSIS — D3132 Benign neoplasm of left choroid: Secondary | ICD-10-CM | POA: Diagnosis not present

## 2023-10-09 LAB — CUP PACEART REMOTE DEVICE CHECK
Battery Remaining Longevity: 138 mo
Battery Remaining Percentage: 95 %
Brady Statistic RV Percent Paced: 0 %
Date Time Interrogation Session: 20241119080700
HighPow Impedance: 70 Ohm
Implantable Lead Connection Status: 753985
Implantable Lead Implant Date: 20200730
Implantable Lead Location: 753860
Implantable Lead Model: 292
Implantable Lead Serial Number: 447014
Implantable Pulse Generator Implant Date: 20200730
Lead Channel Impedance Value: 432 Ohm
Lead Channel Pacing Threshold Amplitude: 1.5 V
Lead Channel Pacing Threshold Pulse Width: 0.4 ms
Lead Channel Setting Pacing Amplitude: 3.5 V
Lead Channel Setting Pacing Pulse Width: 0.4 ms
Lead Channel Setting Sensing Sensitivity: 0.5 mV
Pulse Gen Serial Number: 266091
Zone Setting Status: 755011

## 2023-10-19 ENCOUNTER — Other Ambulatory Visit: Payer: Self-pay

## 2023-10-19 ENCOUNTER — Other Ambulatory Visit (HOSPITAL_COMMUNITY): Payer: Self-pay

## 2023-10-20 ENCOUNTER — Other Ambulatory Visit (HOSPITAL_COMMUNITY): Payer: Self-pay

## 2023-10-21 ENCOUNTER — Other Ambulatory Visit (HOSPITAL_COMMUNITY): Payer: Self-pay

## 2023-10-21 ENCOUNTER — Other Ambulatory Visit: Payer: Self-pay

## 2023-10-22 ENCOUNTER — Other Ambulatory Visit (HOSPITAL_COMMUNITY): Payer: Self-pay

## 2023-10-30 NOTE — Progress Notes (Signed)
Remote ICD transmission.   

## 2023-10-31 ENCOUNTER — Other Ambulatory Visit: Payer: Self-pay

## 2023-11-26 NOTE — Progress Notes (Signed)
 [facility] CLINIC CONSULT NOTE  Primary Care: [name], [name], MD Primary Cardiologist: Victory LELON [name], MD (Inactive) Oncology: Dr. Odean HF Cardiology: Dr. Rolan  Chief Complaint: Heart Failure follow up   HPI:  Toni [name] is a 68 y.o. [place] employee with a history of chronic systolic heart failure, due to nonischemic cardiomyopathy, HTN, DM, and hyperlipidemia.    Initially diagnosed with NICM in 1998 thought to be from HTN versus viral. Had cath in 1998 that was negative for coronary disease. EF at that time was 20% but EF recovered in 2012.    In October 2019, she had a cough/virus and she took OTC meds. Says she would feel better for a little while but then felt bad again. She has been working full time as artist at [employer] and prior to admission in 12/19 she had noticed increased fatigue and dyspnea.  She presented to [facility] ED on 11/13/18 with increased shortness of breath. She was admitted and echo was completed showing EF had gone back down to 15%. She had RHC/LHC with nonobstructive CAD and relatively preserved cardiac output. She was diuresed in the hospital and discharged. CPX in 2/20 showed only mild HF limitation.   Given family history of cardiomyopathy, I sent genetic testing.  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  I had her see Dr. Fairy, we think that the variant may be benign and unrelated to her cardiomyopathy.  She has no history of conduction disturbance.   Echo in 6/20 showed that EF remains low at 25-30%, mild LV dilation, mildly decreased RV systolic function.  She had a Environmental Manager ICD placed.   8/21 diagnosed with right breast cancer, ER+/PR-/HER2+.  She had right lumpectomy.   Echo in 10/21 showed EF 40% with mildly decreased RV systolic function. She has been getting Taxol  + Herceptin .  Repeat echo in 11/21 showed EF 30% with mild LV dilation, mildly decreased RV systolic function. Herceptin  was  stopped and it was decided that she would be a poor candidate for it in the future.   Echo in 1/22 showed EF 25-30%, diffuse hypokinesis, mildly decreased RV systolic function. CPX in 3/22 showed mild HF limitation.   Echo 3/23 showed EF 20-25%, mildly decreased RV systolic function, mild MR.   Echo (9/24): EF 25, G1DD, RV normal, mild MR  Today she returns for HF follow up. Overall feeling fine. Denies increasing SOB, CP, edema, or PND/Orthopnea. Reports mild dizziness after taking her medications in the morning that usually resolves. No falls, not bothersome. Sleeping on 1 pillow at home. Appetite good. No fever or chills. Rarely needing her PRN Lasix , although did take x1 this week for slight weight gain after having a salty meal. Weight at home 135 lb. Taking all medications.  [employer] device interrogation (personally reviewed): Heartlogic score 1    Labs (1/20): K 4, creatinine 0.67 => 0.74 Labs (3/20): K 4.1, creatinine 0.87 Labs (6/20): digoxin  level 0.9 Labs (7/20): K 4.1, creatinine 0.74 Labs (8/20): digoxin  0.4 Labs (11/20): K 4.5, creatinine 0.65, digoxin  1.2 Labs (2/21): digoxin  0.4, K 4.2, creatinine 0.9 Labs (6/21): K 5.1, creatinine 0.83 Labs (9/21): K 4.6, creatinine 0.89 Labs (11/21): K 4, creatinine 0.78 Labs (1/22): K 4.4, creatinine 0.89 Labs (3/22): K 4.2, creatinine 0.72 Labs (7/22): K 4.8, creatinine 0.92, LDL 63 Labs (8/22): digoxin  0.3 Labs (11/22): K 4.9, creatinine 0.83 Labs (12/22): K 4, creatinine 0.8, digoxin  0.7 Labs (3/23): K  4.4, creatinine 0.79 Labs (7/23): K 4, creatinine 0.91, digoxin  0.6 Labs (9/24): K 4.2, creatinine 0.76, digoxin  0.4  PMH: 1. HTN 2. Type 2 diabetes 3. Hyperlipidemia 4. Chronic systolic CHF: Nonischemic cardiomyopathy.  Diagnosed in 1998, EF 20% by echo at that time.  Echo back to normal range by 2012.   - LHC/RHC (12/19): D1 60-70% stenosis; mean RA 6, PA 58/22, mean PCWP 22, CI 2.9.  - Echo (12/19): EF 15% with  severe LV dilation, moderate central MR likely functional.  - Cardiac MRI (12/19): Moderate LV dilation with EF 14%, mild RV dilation with EF 17%, LGE at the inferior RV insertion site (nonspecific).  - CPX (2/20): peak VO2 18.6, VE/VCO2 31, RER 1.18 => mild HF limitation.  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  Suspect the variant is benign.  - Echo (6/20): EF 25-30%, mild LV dilation, mildly decreased RV systolic function.  - Echo (10/21): EF 40%, diffuse hypokinesis, mildly decreased RV systolic function.  - Echo (11/21): EF 30%, diffuse hypokinesis, mildly decreased RV systolic function.  - Echo (1/22): EF 25-30%, diffuse hypokinesis, mildly decreased RV systolic function.  - CPX (3/22): Peak VO2 22, VE/VCO2 slope 33, RER 1.13.  Mild HF limitation.  - Echo (3/23): EF 20-25%, mildly decreased RV systolic function, mild MR.  - Echo (9/24): EF 25, G1DD, RV normal, mild MR 5. Angioedema with ACEI 6. Left shoulder adhesive capsulitis 7. Depression 8. Breast cancer: 8/21 diagnosed with right breast cancer, ER+/PR-/HER2+.  She had right lumpectomy.  Radiation ongoing.   Social History   Socioeconomic History   Marital status: Single    Spouse name: Not on file   Number of children: Not on file   Years of education: 16   Highest education level: Bachelor's degree (e.g., BA, AB, BS)  Occupational History   Occupation: Special Educational Needs Teacher: [place]  Tobacco Use   Smoking status: Never   Smokeless tobacco: Never  Vaping Use   Vaping status: Never Used  Substance and Sexual Activity   Alcohol use: Yes    Alcohol/week: 0.0 standard drinks of alcohol    Comment: less than once a month   Drug use: No   Sexual activity: Never  Other Topics Concern   Not on file  Social History Narrative   Works at [employer].  Lives alone.     Social Drivers of Corporate Investment Banker Strain: Low Risk  (07/06/2020)    Overall Financial Resource Strain (CARDIA)    Difficulty of Paying Living Expenses: Not hard at all  Food Insecurity: No Food Insecurity (10/28/2020)   Hunger Vital Sign    Worried About Running Out of Food in the Last Year: Never true    Ran Out of Food in the Last Year: Never true  Transportation Needs: No Transportation Needs (10/28/2020)   PRAPARE - Administrator, Civil Service (Medical): No    Lack of Transportation (Non-Medical): No  Physical Activity: Inactive (11/03/2019)   Exercise Vital Sign    Days of Exercise per Week: 0 days    Minutes of Exercise per Session: 0 min  Stress: Stress Concern Present (11/03/2019)   [employer] of Occupational Health - Occupational Stress Questionnaire    Feeling of Stress : Very much  Social Connections: Not on file  Intimate Partner Violence: Not At Risk (12/06/2020)   Humiliation, Afraid, Rape, and Kick questionnaire    Fear of  Current or Ex-Partner: No    Emotionally Abused: No    Physically Abused: No    Sexually Abused: No   Family History  Problem Relation Age of Onset   Diabetes Mellitus I Mother    Lung cancer Mother 21   Breast cancer Mother        dx. late 30s/early 46s   Diabetes Mellitus I Father    Sudden death Father 23   Prostate cancer Brother 71   Hypertension Brother    Hypertension Brother    Diabetes Mellitus I Brother    Benign prostatic hyperplasia Brother    Heart failure Paternal Uncle    Thyroid  cancer Niece        dx. in her 30s   Breast cancer Cousin        dx. in her 38s, recurrence in her 35s (maternal first cousin)   Breast cancer Cousin        female dx. in his early 69s (paternal first cousin)   Cancer Cousin        dx. in his early 57s, unknown type (paternal first cousin)   Cancer Cousin 6       unknown type (paternal first cousin)   ROS: All systems reviewed and negative except as per HPI.   Current Outpatient Medications  Medication Sig Dispense Refill   acetaminophen   (TYLENOL ) 500 MG tablet Take 1,000 mg by mouth every 6 (six) hours as needed for moderate pain or headache.     anastrozole  (ARIMIDEX ) 1 MG tablet Take 1 tablet (1 mg total) by mouth daily. 90 tablet 3   aspirin  81 MG chewable tablet Chew 1 tablet (81 mg total) by mouth daily. 30 tablet 0   carvedilol  (COREG ) 25 MG tablet Take 1 tablet (25 mg total) by mouth 2 (two) times daily with a meal. 180 tablet 1   cetirizine (ZYRTEC) 10 MG tablet Take 10 mg by mouth at bedtime.     Continuous Blood Gluc Receiver (FREESTYLE LIBRE 14 DAY READER) DEVI Apply topically as directed.     Continuous Glucose Sensor (FREESTYLE LIBRE 14 DAY SENSOR) MISC Use 1 sensor to monitor blood sugar levels change every 14 (fourteen) days. 2 each 6   digoxin  (LANOXIN ) 0.125 MG tablet Take 0.5 tablets (0.0625 mg total) by mouth daily. 45 tablet 2   furosemide  (LASIX ) 20 MG tablet Take 20 mg by mouth as needed.     glipiZIDE  (GLUCOTROL  XL) 10 MG 24 hr tablet Take 2 tablets (20 mg total) by mouth daily. 60 tablet 3   hydrALAZINE  (APRESOLINE ) 50 MG tablet Take 1.5 tablets (75 mg total) by mouth 3 (three) times daily. 1215 tablet 0   isosorbide  dinitrate (ISORDIL ) 20 MG tablet Take 2 tablets (40 mg total) by mouth 3 (three) times daily. 540 tablet 2   JARDIANCE  25 MG TABS tablet Take 25 mg by mouth daily.     losartan  (COZAAR ) 50 MG tablet Take 1.5 tablets (75 mg total) by mouth 2 (two) times daily. 90 tablet 3   metFORMIN  (GLUCOPHAGE ) 1000 MG tablet Take 1 tablet (1,000 mg total) by mouth 2 (two) times daily. 60 tablet 6   Multiple Vitamin (MULTIVITAMIN WITH MINERALS) TABS tablet Take 1 tablet by mouth daily. Centrum Silver     Semaglutide , 1 MG/DOSE, (OZEMPIC , 1 MG/DOSE,) 2 MG/1.5ML SOPN Inject 2 mg as directed once a week. Takes on Wednesdays     simvastatin  (ZOCOR ) 20 MG tablet Take 1 tablet (20 mg total) by mouth daily.  90 tablet 0   spironolactone  (ALDACTONE ) 25 MG tablet Take 1 tablet (25 mg total) by mouth every evening. 90  tablet 1   TRUE METRIX BLOOD GLUCOSE TEST test strip 1 each by Other route as directed.   5   TRUEPLUS LANCETS 30G MISC 1 each by Other route as directed. Use as directed.  5   UNIFINE PENTIPS 32G X 4 MM MISC 1 each by Other route as directed.      No current facility-administered medications for this encounter.   BP 102/74   Pulse 90   Wt 62.1 kg (136 lb 12.8 oz)   SpO2 98%   BMI 22.76 kg/m   Filed Weights   11/28/23 0953  Weight: 62.1 kg (136 lb 12.8 oz)    Physical Exam: General: Well appearing. No distress on RA HEENT: neck supple.   Cardiac: JVP not visible. S1 and S2 present. No murmurs or rub. Resp: Lung sounds clear and equal B/L Abdomen: Soft, non-tender, non-distended. + BS. Extremities: Warm and dry. No rash, cyanosis.  No edema.  Neuro: Alert and oriented x3. Affect pleasant. Moves all extremities without difficulty.  Assessment/Plan: 1. Chronic systolic CHF: Nonischemic cardiomyopathy by 12/19 cath.  Cardiac MRI with LV EF 14%, RV EF 17%. No definite evidence for myocarditis or infiltrative disease by delayed enhancement images.  Most likely cause of cardiomyopathy is familial versus prior viral myocarditis.  Brother also had a cardiomyopathy of uncertain etiology.  Genetic testing was done, showing an LMNA gene variant of uncertain significance => she saw Dr. Fairy, suspect benign/uninvolved variant (no conduction abnormality).  CPX in 2/20 showed only mild HF limitation. Echo in 6/20 showed EF 25-30%. Echo in 10/21 showed EF up some to 40%.  She now has a Environmental Manager ICD.  She was started on Taxol /Herceptin  chemotherapy for breast cancer and 11/21 echo showed fall in EF to 30%.  It was decided that she would not be a candidate for ongoing Herceptin  and this medication was stopped. Echo in 1/22 showed EF 25-30%, diffuse hypokinesis, mildly decreased RV systolic function.  CPX in 3/22 showed mild HF limitation. Echo 3/23 and 9/24 showed EF remains 20-25%. On exam and  by Heartlogic, she is not volume overloaded. NYHA class I symptoms, overall feels well. Narrow QRS, not CRT candidate.  - Continue Coreg  25 mg bid.   - Continue losartan  to 75 mg bid (No Entresto  with history of ACEI angioedema). BMET/BNP today.  - Continue Jardiance  10 mg daily - Continue spironolactone  25 mg daily.  - Continue digoxin  0.625 mg daily, check level.  - Continue hydralazine  75 mg tid + isordil  40 mg tid.  - Continue Lasix  20 mg PRN. - Do not think she is symptomatic enough at this time for barostimulator activation therapy or cardiac contractility modulator.  2. Type II diabetes: She is on Jardiance .  3. Breast cancer: HER2+. She started Herceptin  and Taxol  chemotherapy.  EF fell to 30% and Herceptin  was stopped. I do not think Herceptin  will be a good option for her going forwards.  Follow up in 4 months with Dr. McLean  Barrett Goldie  11/28/2023

## 2023-11-28 ENCOUNTER — Encounter (HOSPITAL_COMMUNITY): Payer: Self-pay

## 2023-11-28 ENCOUNTER — Ambulatory Visit (HOSPITAL_COMMUNITY)
Admission: RE | Admit: 2023-11-28 | Discharge: 2023-11-28 | Disposition: A | Discharge status: Home / Self Care | Payer: PPO | Source: Ambulatory Visit | Attending: Cardiology | Admitting: Cardiology

## 2023-11-28 VITALS — BP 102/74 | HR 90 | Wt 136.8 lb

## 2023-11-28 DIAGNOSIS — Z79899 Other long term (current) drug therapy: Secondary | ICD-10-CM | POA: Diagnosis not present

## 2023-11-28 DIAGNOSIS — C50919 Malignant neoplasm of unspecified site of unspecified female breast: Secondary | ICD-10-CM | POA: Diagnosis not present

## 2023-11-28 DIAGNOSIS — Z853 Personal history of malignant neoplasm of breast: Secondary | ICD-10-CM | POA: Diagnosis not present

## 2023-11-28 DIAGNOSIS — I428 Other cardiomyopathies: Secondary | ICD-10-CM | POA: Insufficient documentation

## 2023-11-28 DIAGNOSIS — Z7984 Long term (current) use of oral hypoglycemic drugs: Secondary | ICD-10-CM | POA: Insufficient documentation

## 2023-11-28 DIAGNOSIS — I5022 Chronic systolic (congestive) heart failure: Secondary | ICD-10-CM | POA: Diagnosis not present

## 2023-11-28 DIAGNOSIS — Z7985 Long-term (current) use of injectable non-insulin antidiabetic drugs: Secondary | ICD-10-CM | POA: Insufficient documentation

## 2023-11-28 DIAGNOSIS — I110 Hypertensive heart disease with heart failure: Secondary | ICD-10-CM | POA: Diagnosis present

## 2023-11-28 DIAGNOSIS — Z95810 Presence of automatic (implantable) cardiac defibrillator: Secondary | ICD-10-CM | POA: Insufficient documentation

## 2023-11-28 DIAGNOSIS — E119 Type 2 diabetes mellitus without complications: Secondary | ICD-10-CM | POA: Diagnosis not present

## 2023-11-28 DIAGNOSIS — E785 Hyperlipidemia, unspecified: Secondary | ICD-10-CM | POA: Diagnosis not present

## 2023-11-28 LAB — BASIC METABOLIC PANEL
Anion gap: 8 (ref 5–15)
BUN: 12 mg/dL (ref 8–23)
CO2: 27 mmol/L (ref 22–32)
Calcium: 9.6 mg/dL (ref 8.9–10.3)
Chloride: 102 mmol/L (ref 98–111)
Creatinine, Ser: 0.67 mg/dL (ref 0.44–1.00)
GFR, Estimated: >60 mL/min (ref >60)
Glucose, Bld: 171 mg/dL — ABNORMAL HIGH (ref 70–99)
Potassium: 4.3 mmol/L (ref 3.5–5.1)
Sodium: 137 mmol/L (ref 135–145)

## 2023-11-28 LAB — BRAIN NATRIURETIC PEPTIDE: B Natriuretic Peptide: 49.1 pg/mL (ref 0.0–100.0)

## 2023-11-28 LAB — DIGOXIN LEVEL: Digoxin Level: 0.4 ng/mL — ABNORMAL LOW (ref 0.8–2.0)

## 2023-11-28 NOTE — Patient Instructions (Signed)
 Medication Changes:  No Changes In Medications at this time.   Lab Work:  Labs done today, your results will be available in MyChart, we will contact you for abnormal readings.  Follow-Up in: 4 months PLEASE CALL OUR OFFICE AROUND MARCH/APRIL TO GET SCHEDULED FOR YOUR APPOINTMENT. PHONE NUMBER IS [phone] OPTION 2   At the [facility], you and your health needs are our priority. We have a designated team specialized in the treatment of Heart Failure. This Care Team includes your primary Heart Failure Specialized Cardiologist (physician), Advanced Practice Providers (APPs- Physician Assistants and Nurse Practitioners), and Pharmacist who all work together to provide you with the care you need, when you need it.   You may see any of the following providers on your designated Care Team at your next follow up:  Dr. Toribio Fuel Dr. Ezra Shuck Dr. Ria Commander Dr. Odis Brownie Greig Mosses, NP Caffie Shed, [place] [facility] [place], [place] Beckey Coe, NP Jordan Lee, NP Tinnie Redman, PharmD   Please be sure to bring in all your medications bottles to every appointment.   Need to Contact Us :  If you have any questions or concerns before your next appointment please send us  a message through [place] or call our office at [phone].    TO LEAVE A MESSAGE FOR THE NURSE SELECT OPTION 2, PLEASE LEAVE A MESSAGE INCLUDING: YOUR NAME DATE OF BIRTH CALL BACK NUMBER REASON FOR CALL**this is important as we prioritize the call backs  YOU WILL RECEIVE A CALL BACK THE SAME DAY AS LONG AS YOU CALL BEFORE 4:00 PM

## 2023-12-23 ENCOUNTER — Other Ambulatory Visit (HOSPITAL_COMMUNITY): Payer: Self-pay | Admitting: Cardiology

## 2023-12-23 ENCOUNTER — Other Ambulatory Visit: Payer: Self-pay

## 2023-12-23 ENCOUNTER — Other Ambulatory Visit (HOSPITAL_COMMUNITY): Payer: Self-pay

## 2023-12-24 ENCOUNTER — Other Ambulatory Visit: Payer: Self-pay

## 2023-12-24 ENCOUNTER — Other Ambulatory Visit (HOSPITAL_COMMUNITY): Payer: Self-pay

## 2023-12-24 MED ORDER — SIMVASTATIN 20 MG PO TABS
20.0000 mg | ORAL_TABLET | Freq: Every day | ORAL | 0 refills | Status: DC
  Filled 2023-12-24: qty 90, 90d supply, fill #0

## 2023-12-24 MED FILL — Spironolactone Tab 25 MG: ORAL | 90 days supply | Qty: 90 | Fill #0 | Status: AC

## 2023-12-24 MED FILL — Carvedilol Tab 25 MG: ORAL | 90 days supply | Qty: 180 | Fill #0 | Status: AC

## 2023-12-26 ENCOUNTER — Other Ambulatory Visit (HOSPITAL_COMMUNITY): Payer: Self-pay | Admitting: Cardiology

## 2023-12-26 ENCOUNTER — Other Ambulatory Visit: Payer: Self-pay

## 2023-12-26 ENCOUNTER — Other Ambulatory Visit (HOSPITAL_COMMUNITY): Payer: Self-pay

## 2023-12-27 ENCOUNTER — Other Ambulatory Visit (HOSPITAL_COMMUNITY): Payer: Self-pay

## 2023-12-27 ENCOUNTER — Other Ambulatory Visit: Payer: Self-pay

## 2023-12-27 MED FILL — Losartan Potassium Tab 50 MG: ORAL | 30 days supply | Qty: 90 | Fill #0 | Status: AC

## 2023-12-31 ENCOUNTER — Other Ambulatory Visit (HOSPITAL_COMMUNITY): Payer: Self-pay

## 2024-01-06 ENCOUNTER — Ambulatory Visit (INDEPENDENT_AMBULATORY_CARE_PROVIDER_SITE_OTHER): Payer: HMO

## 2024-01-06 DIAGNOSIS — I5022 Chronic systolic (congestive) heart failure: Secondary | ICD-10-CM

## 2024-01-06 DIAGNOSIS — I428 Other cardiomyopathies: Secondary | ICD-10-CM

## 2024-01-07 LAB — CUP PACEART REMOTE DEVICE CHECK
Battery Remaining Longevity: 132 mo
Battery Remaining Percentage: 93 %
Brady Statistic RV Percent Paced: 0 %
Date Time Interrogation Session: 20250217232600
HighPow Impedance: 66 Ohm
Implantable Lead Connection Status: 753985
Implantable Lead Implant Date: 20200730
Implantable Lead Location: 753860
Implantable Lead Model: 292
Implantable Lead Serial Number: 447014
Implantable Pulse Generator Implant Date: 20200730
Lead Channel Impedance Value: 423 Ohm
Lead Channel Pacing Threshold Amplitude: 1.4 V
Lead Channel Pacing Threshold Pulse Width: 0.4 ms
Lead Channel Setting Pacing Amplitude: 3.5 V
Lead Channel Setting Pacing Pulse Width: 0.4 ms
Lead Channel Setting Sensing Sensitivity: 0.5 mV
Pulse Gen Serial Number: 266091
Zone Setting Status: 755011

## 2024-01-13 ENCOUNTER — Other Ambulatory Visit: Payer: Self-pay | Admitting: Family Medicine

## 2024-01-13 DIAGNOSIS — Z1231 Encounter for screening mammogram for malignant neoplasm of breast: Secondary | ICD-10-CM

## 2024-01-14 ENCOUNTER — Ambulatory Visit
Admission: RE | Admit: 2024-01-14 | Discharge: 2024-01-14 | Discharge status: Home / Self Care | Payer: PPO | Source: Ambulatory Visit | Attending: Family Medicine | Admitting: Family Medicine

## 2024-01-14 DIAGNOSIS — Z1231 Encounter for screening mammogram for malignant neoplasm of breast: Secondary | ICD-10-CM

## 2024-01-20 ENCOUNTER — Other Ambulatory Visit: Payer: Self-pay

## 2024-01-20 ENCOUNTER — Encounter (HOSPITAL_COMMUNITY): Payer: Self-pay | Admitting: Cardiology

## 2024-01-20 ENCOUNTER — Other Ambulatory Visit (HOSPITAL_COMMUNITY): Payer: Self-pay

## 2024-01-21 ENCOUNTER — Other Ambulatory Visit: Payer: Self-pay

## 2024-01-21 MED ORDER — JARDIANCE 25 MG PO TABS
25.0000 mg | ORAL_TABLET | Freq: Every morning | ORAL | 3 refills | Status: AC
  Filled 2024-01-21 – 2024-02-10 (×2): qty 90, 90d supply, fill #0

## 2024-01-21 MED ORDER — GLIPIZIDE ER 10 MG PO TB24
20.0000 mg | ORAL_TABLET | Freq: Every day | ORAL | 3 refills | Status: AC
  Filled 2024-01-21 – 2024-01-23 (×2): qty 180, 90d supply, fill #0
  Filled 2024-04-21: qty 180, 90d supply, fill #1
  Filled 2024-07-22: qty 180, 90d supply, fill #2
  Filled 2024-10-28: qty 180, 90d supply, fill #3

## 2024-01-21 MED ORDER — METFORMIN HCL 1000 MG PO TABS
1000.0000 mg | ORAL_TABLET | Freq: Two times a day (BID) | ORAL | 3 refills | Status: AC
  Filled 2024-01-21 – 2024-01-23 (×2): qty 180, 90d supply, fill #0
  Filled 2024-04-19 – 2024-06-16 (×5): qty 180, 90d supply, fill #1
  Filled 2024-09-10: qty 180, 90d supply, fill #2
  Filled 2024-12-13: qty 180, 90d supply, fill #3

## 2024-01-21 MED ORDER — OZEMPIC (2 MG/DOSE) 8 MG/3ML ~~LOC~~ SOPN
2.0000 mg | PEN_INJECTOR | SUBCUTANEOUS | 3 refills | Status: DC
  Filled 2024-01-21 – 2024-02-10 (×2): qty 9, 84d supply, fill #0

## 2024-01-23 ENCOUNTER — Other Ambulatory Visit: Payer: Self-pay

## 2024-01-23 ENCOUNTER — Other Ambulatory Visit (HOSPITAL_COMMUNITY): Payer: Self-pay

## 2024-01-27 ENCOUNTER — Other Ambulatory Visit: Payer: Self-pay

## 2024-02-05 ENCOUNTER — Other Ambulatory Visit: Payer: Self-pay

## 2024-02-10 MED FILL — Losartan Potassium Tab 50 MG: ORAL | 30 days supply | Qty: 90 | Fill #1 | Status: AC

## 2024-02-11 ENCOUNTER — Other Ambulatory Visit: Payer: Self-pay

## 2024-02-11 ENCOUNTER — Other Ambulatory Visit (HOSPITAL_COMMUNITY): Payer: Self-pay

## 2024-02-11 NOTE — Addendum Note (Signed)
 Addended by: Geralyn Flash D on: 02/11/2024 11:50 AM   Modules accepted: Orders

## 2024-02-11 NOTE — Progress Notes (Signed)
 Remote ICD transmission.

## 2024-02-12 ENCOUNTER — Other Ambulatory Visit (HOSPITAL_COMMUNITY): Payer: Self-pay

## 2024-02-12 ENCOUNTER — Other Ambulatory Visit: Payer: Self-pay

## 2024-02-18 ENCOUNTER — Other Ambulatory Visit: Payer: Self-pay

## 2024-02-18 ENCOUNTER — Other Ambulatory Visit (HOSPITAL_COMMUNITY): Payer: Self-pay

## 2024-02-24 ENCOUNTER — Other Ambulatory Visit (HOSPITAL_COMMUNITY): Payer: Self-pay

## 2024-03-11 ENCOUNTER — Other Ambulatory Visit (HOSPITAL_COMMUNITY): Payer: Self-pay

## 2024-03-11 MED FILL — Losartan Potassium Tab 50 MG: ORAL | 30 days supply | Qty: 90 | Fill #2 | Status: AC

## 2024-03-12 ENCOUNTER — Other Ambulatory Visit: Payer: Self-pay

## 2024-03-24 DIAGNOSIS — H524 Presbyopia: Secondary | ICD-10-CM | POA: Diagnosis not present

## 2024-03-24 DIAGNOSIS — H5213 Myopia, bilateral: Secondary | ICD-10-CM | POA: Diagnosis not present

## 2024-03-24 DIAGNOSIS — H52223 Regular astigmatism, bilateral: Secondary | ICD-10-CM | POA: Diagnosis not present

## 2024-03-24 DIAGNOSIS — D3132 Benign neoplasm of left choroid: Secondary | ICD-10-CM | POA: Diagnosis not present

## 2024-03-26 ENCOUNTER — Other Ambulatory Visit (HOSPITAL_COMMUNITY): Payer: Self-pay | Admitting: Cardiology

## 2024-03-26 ENCOUNTER — Other Ambulatory Visit (HOSPITAL_COMMUNITY): Payer: Self-pay

## 2024-03-26 ENCOUNTER — Other Ambulatory Visit: Payer: Self-pay

## 2024-03-26 MED FILL — Carvedilol Tab 25 MG: ORAL | 90 days supply | Qty: 180 | Fill #1 | Status: AC

## 2024-03-26 MED FILL — Spironolactone Tab 25 MG: ORAL | 90 days supply | Qty: 90 | Fill #1 | Status: AC

## 2024-03-27 ENCOUNTER — Other Ambulatory Visit: Payer: Self-pay

## 2024-03-27 ENCOUNTER — Other Ambulatory Visit (HOSPITAL_COMMUNITY): Payer: Self-pay

## 2024-03-27 MED ORDER — FREESTYLE LIBRE 14 DAY SENSOR MISC
6 refills | Status: DC
  Filled 2024-03-27 (×3): qty 2, 28d supply, fill #0
  Filled 2024-04-15 – 2024-04-19 (×2): qty 2, 28d supply, fill #1
  Filled 2024-05-17: qty 2, 28d supply, fill #2
  Filled 2024-06-16: qty 2, 28d supply, fill #3
  Filled 2024-07-12: qty 2, 28d supply, fill #4
  Filled 2024-08-09: qty 2, 28d supply, fill #5
  Filled 2024-09-06: qty 2, 28d supply, fill #6

## 2024-03-27 MED ORDER — SIMVASTATIN 20 MG PO TABS
20.0000 mg | ORAL_TABLET | Freq: Every day | ORAL | 0 refills | Status: DC
  Filled 2024-03-27 – 2024-04-15 (×2): qty 90, 90d supply, fill #0

## 2024-03-30 ENCOUNTER — Other Ambulatory Visit (HOSPITAL_COMMUNITY): Payer: Self-pay

## 2024-03-31 ENCOUNTER — Other Ambulatory Visit (HOSPITAL_COMMUNITY): Payer: Self-pay

## 2024-04-06 ENCOUNTER — Ambulatory Visit (INDEPENDENT_AMBULATORY_CARE_PROVIDER_SITE_OTHER): Payer: HMO

## 2024-04-06 ENCOUNTER — Other Ambulatory Visit (HOSPITAL_COMMUNITY): Payer: Self-pay

## 2024-04-06 DIAGNOSIS — I5022 Chronic systolic (congestive) heart failure: Secondary | ICD-10-CM

## 2024-04-06 DIAGNOSIS — I428 Other cardiomyopathies: Secondary | ICD-10-CM

## 2024-04-07 ENCOUNTER — Ambulatory Visit: Payer: Self-pay | Admitting: Internal Medicine

## 2024-04-07 ENCOUNTER — Other Ambulatory Visit: Payer: Self-pay

## 2024-04-07 LAB — CUP PACEART REMOTE DEVICE CHECK
Battery Remaining Longevity: 132 mo
Battery Remaining Percentage: 91 %
Brady Statistic RV Percent Paced: 0 %
Date Time Interrogation Session: 20250519051400
HighPow Impedance: 64 Ohm
Implantable Lead Connection Status: 753985
Implantable Lead Implant Date: 20200730
Implantable Lead Location: 753860
Implantable Lead Model: 292
Implantable Lead Serial Number: 447014
Implantable Pulse Generator Implant Date: 20200730
Lead Channel Impedance Value: 408 Ohm
Lead Channel Pacing Threshold Amplitude: 1.4 V
Lead Channel Pacing Threshold Pulse Width: 0.4 ms
Lead Channel Setting Pacing Amplitude: 3.5 V
Lead Channel Setting Pacing Pulse Width: 0.4 ms
Lead Channel Setting Sensing Sensitivity: 0.5 mV
Pulse Gen Serial Number: 266091
Zone Setting Status: 755011

## 2024-04-09 ENCOUNTER — Other Ambulatory Visit: Payer: Self-pay

## 2024-04-15 ENCOUNTER — Other Ambulatory Visit (HOSPITAL_COMMUNITY): Payer: Self-pay

## 2024-04-15 ENCOUNTER — Other Ambulatory Visit: Payer: Self-pay

## 2024-04-15 MED FILL — Losartan Potassium Tab 50 MG: ORAL | 30 days supply | Qty: 90 | Fill #3 | Status: AC

## 2024-04-20 ENCOUNTER — Other Ambulatory Visit: Payer: Self-pay

## 2024-04-20 ENCOUNTER — Other Ambulatory Visit (HOSPITAL_COMMUNITY): Payer: Self-pay

## 2024-05-11 ENCOUNTER — Other Ambulatory Visit (HOSPITAL_COMMUNITY): Payer: Self-pay

## 2024-05-17 ENCOUNTER — Other Ambulatory Visit (HOSPITAL_COMMUNITY): Payer: Self-pay | Admitting: Cardiology

## 2024-05-18 ENCOUNTER — Other Ambulatory Visit (HOSPITAL_COMMUNITY): Payer: Self-pay

## 2024-05-18 ENCOUNTER — Other Ambulatory Visit: Payer: Self-pay

## 2024-05-19 ENCOUNTER — Other Ambulatory Visit: Payer: Self-pay

## 2024-05-19 ENCOUNTER — Other Ambulatory Visit (HOSPITAL_COMMUNITY): Payer: Self-pay

## 2024-05-20 ENCOUNTER — Other Ambulatory Visit (HOSPITAL_COMMUNITY): Payer: Self-pay

## 2024-05-20 ENCOUNTER — Other Ambulatory Visit: Payer: Self-pay

## 2024-05-20 MED ORDER — LOSARTAN POTASSIUM 50 MG PO TABS
75.0000 mg | ORAL_TABLET | Freq: Two times a day (BID) | ORAL | 3 refills | Status: DC
  Filled 2024-05-20: qty 90, 30d supply, fill #0
  Filled 2024-06-19: qty 90, 30d supply, fill #1
  Filled 2024-08-09: qty 90, 30d supply, fill #2
  Filled 2024-09-06: qty 90, 30d supply, fill #3

## 2024-05-20 NOTE — Addendum Note (Signed)
 Addended by: TAWNI DRILLING D on: 05/20/2024 10:26 AM   Modules accepted: Orders

## 2024-05-20 NOTE — Progress Notes (Signed)
 Remote ICD transmission.

## 2024-06-18 ENCOUNTER — Other Ambulatory Visit (HOSPITAL_COMMUNITY): Payer: Self-pay

## 2024-06-18 ENCOUNTER — Other Ambulatory Visit (HOSPITAL_COMMUNITY): Payer: Self-pay | Admitting: Cardiology

## 2024-06-18 MED ORDER — SPIRONOLACTONE 25 MG PO TABS
25.0000 mg | ORAL_TABLET | Freq: Every evening | ORAL | 0 refills | Status: DC
  Filled 2024-06-18: qty 90, 90d supply, fill #0

## 2024-06-18 MED ORDER — CARVEDILOL 25 MG PO TABS
25.0000 mg | ORAL_TABLET | Freq: Two times a day (BID) | ORAL | 0 refills | Status: DC
  Filled 2024-06-18: qty 180, 90d supply, fill #0

## 2024-06-28 ENCOUNTER — Other Ambulatory Visit (HOSPITAL_COMMUNITY): Payer: Self-pay

## 2024-06-28 ENCOUNTER — Other Ambulatory Visit (HOSPITAL_COMMUNITY): Payer: Self-pay | Admitting: Cardiology

## 2024-06-29 ENCOUNTER — Other Ambulatory Visit: Payer: Self-pay

## 2024-06-29 ENCOUNTER — Other Ambulatory Visit (HOSPITAL_COMMUNITY): Payer: Self-pay

## 2024-06-29 MED FILL — Digoxin Tab 125 MCG (0.125 MG): ORAL | 90 days supply | Qty: 45 | Fill #0 | Status: AC

## 2024-07-03 ENCOUNTER — Other Ambulatory Visit (HOSPITAL_COMMUNITY): Payer: Self-pay

## 2024-07-04 ENCOUNTER — Other Ambulatory Visit (HOSPITAL_COMMUNITY): Payer: Self-pay

## 2024-07-06 ENCOUNTER — Ambulatory Visit (INDEPENDENT_AMBULATORY_CARE_PROVIDER_SITE_OTHER): Payer: HMO

## 2024-07-06 DIAGNOSIS — I428 Other cardiomyopathies: Secondary | ICD-10-CM

## 2024-07-08 LAB — CUP PACEART REMOTE DEVICE CHECK
Battery Remaining Longevity: 126 mo
Battery Remaining Percentage: 88 %
Brady Statistic RV Percent Paced: 0 %
Date Time Interrogation Session: 20250818051400
HighPow Impedance: 64 Ohm
Implantable Lead Connection Status: 753985
Implantable Lead Implant Date: 20200730
Implantable Lead Location: 753860
Implantable Lead Model: 292
Implantable Lead Serial Number: 447014
Implantable Pulse Generator Implant Date: 20200730
Lead Channel Impedance Value: 424 Ohm
Lead Channel Pacing Threshold Amplitude: 1.1 V
Lead Channel Pacing Threshold Pulse Width: 0.4 ms
Lead Channel Setting Pacing Amplitude: 3.5 V
Lead Channel Setting Pacing Pulse Width: 0.4 ms
Lead Channel Setting Sensing Sensitivity: 0.5 mV
Pulse Gen Serial Number: 266091
Zone Setting Status: 755011

## 2024-07-12 ENCOUNTER — Other Ambulatory Visit (HOSPITAL_COMMUNITY): Payer: Self-pay | Admitting: Cardiology

## 2024-07-12 ENCOUNTER — Ambulatory Visit: Payer: Self-pay | Admitting: Internal Medicine

## 2024-07-13 ENCOUNTER — Other Ambulatory Visit: Payer: Self-pay

## 2024-07-13 ENCOUNTER — Other Ambulatory Visit (HOSPITAL_COMMUNITY): Payer: Self-pay

## 2024-07-13 ENCOUNTER — Encounter (HOSPITAL_COMMUNITY): Payer: Self-pay | Admitting: Cardiology

## 2024-07-13 MED ORDER — SIMVASTATIN 20 MG PO TABS
20.0000 mg | ORAL_TABLET | Freq: Every day | ORAL | 0 refills | Status: DC
  Filled 2024-07-13: qty 90, 90d supply, fill #0

## 2024-08-09 ENCOUNTER — Other Ambulatory Visit (HOSPITAL_COMMUNITY): Payer: Self-pay

## 2024-08-10 ENCOUNTER — Other Ambulatory Visit: Payer: Self-pay

## 2024-08-11 ENCOUNTER — Other Ambulatory Visit (HOSPITAL_COMMUNITY): Payer: Self-pay

## 2024-08-12 NOTE — Progress Notes (Signed)
Remote ICD Transmission.

## 2024-08-16 ENCOUNTER — Other Ambulatory Visit (HOSPITAL_COMMUNITY): Payer: Self-pay

## 2024-08-16 ENCOUNTER — Other Ambulatory Visit (HOSPITAL_COMMUNITY): Payer: Self-pay | Admitting: Cardiology

## 2024-08-17 ENCOUNTER — Other Ambulatory Visit (HOSPITAL_COMMUNITY): Payer: Self-pay

## 2024-08-17 DIAGNOSIS — I429 Cardiomyopathy, unspecified: Secondary | ICD-10-CM | POA: Diagnosis not present

## 2024-08-17 DIAGNOSIS — Z0000 Encounter for general adult medical examination without abnormal findings: Secondary | ICD-10-CM | POA: Diagnosis not present

## 2024-08-17 DIAGNOSIS — E1169 Type 2 diabetes mellitus with other specified complication: Secondary | ICD-10-CM | POA: Diagnosis not present

## 2024-08-17 DIAGNOSIS — G620 Drug-induced polyneuropathy: Secondary | ICD-10-CM | POA: Diagnosis not present

## 2024-08-17 DIAGNOSIS — Z23 Encounter for immunization: Secondary | ICD-10-CM | POA: Diagnosis not present

## 2024-08-17 DIAGNOSIS — I5022 Chronic systolic (congestive) heart failure: Secondary | ICD-10-CM | POA: Diagnosis not present

## 2024-08-17 DIAGNOSIS — Z1331 Encounter for screening for depression: Secondary | ICD-10-CM | POA: Diagnosis not present

## 2024-08-17 DIAGNOSIS — C50511 Malignant neoplasm of lower-outer quadrant of right female breast: Secondary | ICD-10-CM | POA: Diagnosis not present

## 2024-08-17 DIAGNOSIS — I10 Essential (primary) hypertension: Secondary | ICD-10-CM | POA: Diagnosis not present

## 2024-08-17 DIAGNOSIS — E785 Hyperlipidemia, unspecified: Secondary | ICD-10-CM | POA: Diagnosis not present

## 2024-08-17 MED FILL — Isosorbide Dinitrate Tab 20 MG: ORAL | 90 days supply | Qty: 540 | Fill #0 | Status: AC

## 2024-08-17 MED FILL — Hydralazine HCl Tab 50 MG: ORAL | 40 days supply | Qty: 180 | Fill #0 | Status: AC

## 2024-08-18 ENCOUNTER — Other Ambulatory Visit (HOSPITAL_COMMUNITY): Payer: Self-pay

## 2024-08-24 ENCOUNTER — Encounter: Admitting: Internal Medicine

## 2024-09-10 ENCOUNTER — Other Ambulatory Visit (HOSPITAL_COMMUNITY): Payer: Self-pay

## 2024-09-11 ENCOUNTER — Ambulatory Visit: Attending: Internal Medicine | Admitting: Internal Medicine

## 2024-09-11 ENCOUNTER — Encounter: Payer: Self-pay | Admitting: Internal Medicine

## 2024-09-11 VITALS — BP 110/68 | HR 82 | Ht 65.5 in | Wt 129.0 lb

## 2024-09-11 DIAGNOSIS — Z95810 Presence of automatic (implantable) cardiac defibrillator: Secondary | ICD-10-CM

## 2024-09-11 DIAGNOSIS — I428 Other cardiomyopathies: Secondary | ICD-10-CM

## 2024-09-11 DIAGNOSIS — I5022 Chronic systolic (congestive) heart failure: Secondary | ICD-10-CM

## 2024-09-11 LAB — CUP PACEART INCLINIC DEVICE CHECK
Date Time Interrogation Session: 20251024091618
HighPow Impedance: 64 Ohm
Implantable Lead Connection Status: 753985
Implantable Lead Implant Date: 20200730
Implantable Lead Location: 753860
Implantable Lead Model: 292
Implantable Lead Serial Number: 447014
Implantable Pulse Generator Implant Date: 20200730
Lead Channel Impedance Value: 432 Ohm
Lead Channel Sensing Intrinsic Amplitude: 13.6 mV
Lead Channel Setting Pacing Amplitude: 3 V
Lead Channel Setting Pacing Pulse Width: 0.4 ms
Lead Channel Setting Sensing Sensitivity: 0.5 mV
Pulse Gen Serial Number: 266091
Zone Setting Status: 755011

## 2024-09-11 NOTE — Progress Notes (Signed)
 HPI Ms. Degroote presents today to [place] care. She is a pleasant 68 yo woman with a h/o non-ischemic CM, HTN, DM and dyslipidemia. She has seen Dr. Rolan. Her CHF history dates back to 2019. I placed her ICD for primary prevention in 2020. Most recent 2D echo a year ago showed an EF of 25%. She has lost about 20 lbs. She exercises 4-5 times a week.  Allergies  Allergen Reactions   Biaxin [Clarithromycin] Nausea Only   Vasotec [Enalapril] Other (See Comments)    Lip swelling / angioedema Tolerates ARB     Current Outpatient Medications  Medication Sig Dispense Refill   acetaminophen  (TYLENOL ) 500 MG tablet Take 1,000 mg by mouth every 6 (six) hours as needed for moderate pain or headache.     anastrozole  (ARIMIDEX ) 1 MG tablet Take 1 tablet (1 mg total) by mouth daily. 90 tablet 3   aspirin  81 MG chewable tablet Chew 1 tablet (81 mg total) by mouth daily. 30 tablet 0   carvedilol  (COREG ) 25 MG tablet Take 1 tablet (25 mg total) by mouth 2 (two) times daily with a meal. PLEASE SCHEDULE APPOINTMENT FOR MORE REFILLS 180 tablet 0   cetirizine (ZYRTEC) 10 MG tablet Take 10 mg by mouth at bedtime.     Continuous Blood Gluc Receiver (FREESTYLE LIBRE 14 DAY READER) DEVI Apply topically as directed.     Continuous Glucose Sensor (FREESTYLE LIBRE 14 DAY SENSOR) MISC Use 1 sensor to monitor blood sugar levels change every 14 (fourteen) days. 2 each 6   Continuous Glucose Sensor (FREESTYLE LIBRE 14 DAY SENSOR) MISC Apply 1 sensor every 14 days to monitor blood glucose. Remove old sensor before applying a new one 2 each 6   digoxin  (LANOXIN ) 0.125 MG tablet Take 0.5 tablets (0.0625 mg total) by mouth daily. 45 tablet 3   dimenhyDRINATE (DRIMINATE) 50 MG tablet Take 50 mg by mouth every 8 (eight) hours as needed.     empagliflozin  (JARDIANCE ) 25 MG TABS tablet Take 1 tablet (25 mg total) by mouth in the morning. 90 tablet 3   furosemide  (LASIX ) 20 MG tablet Take 20 mg by mouth as needed.      glipiZIDE  (GLUCOTROL  XL) 10 MG 24 hr tablet Take 2 tablets (20 mg total) by mouth daily. 180 tablet 3   hydrALAZINE  (APRESOLINE ) 50 MG tablet Take 1.5 tablets (75 mg total) by mouth 3 (three) times daily. PLEASE SCHEDULE APPOINTMENT FOR MORE REFILLS 180 tablet 0   isosorbide  dinitrate (ISORDIL ) 20 MG tablet Take 2 tablets (40 mg total) by mouth 3 (three) times daily. PLEASE SCHEDULE APPOINTMENT FOR MORE REFILLS 540 tablet 0   losartan  (COZAAR ) 50 MG tablet Take 1.5 tablets (75 mg total) by mouth 2 (two) times daily. (Patient taking differently: Take 50 mg by mouth 2 (two) times daily.) 90 tablet 3   metFORMIN  (GLUCOPHAGE ) 1000 MG tablet Take 1 tablet (1,000 mg total) by mouth 2 (two) times daily with a meal. 180 tablet 3   Multiple Vitamin (MULTIVITAMIN WITH MINERALS) TABS tablet Take 1 tablet by mouth daily. Centrum Silver     Semaglutide , 2 MG/DOSE, (OZEMPIC , 2 MG/DOSE,) 8 MG/3ML SOPN Inject 2 mg into the skin once a week. 9 mL 3   simvastatin  (ZOCOR ) 20 MG tablet Take 1 tablet (20 mg total) by mouth daily. PLEASE SCHEDULE APPOINTMENT FOR MORE REFILLS 90 tablet 0   spironolactone  (ALDACTONE ) 25 MG tablet Take 1 tablet (25 mg total) by mouth every evening. PLEASE  SCHEDULE APPOINTMENT FOR MORE REFILLS 90 tablet 0   TRUE METRIX BLOOD GLUCOSE TEST test strip 1 each by Other route as directed.   5   TRUEPLUS LANCETS 30G MISC 1 each by Other route as directed. Use as directed.  5   UNIFINE PENTIPS 32G X 4 MM MISC 1 each by Other route as directed.      No current facility-administered medications for this visit.     Past Medical History:  Diagnosis Date   AICD (automatic cardioverter/defibrillator) present    Breast cancer ([name]) 2021   right breast   Cancer ([name])    Right sided breast cancer   CHF (congestive heart failure) ([name])    Diabetes mellitus without complication ([name])    type 2    Family history of breast cancer    Family history of prostate cancer    Family history of thyroid   cancer    Hypertension    Personal history of chemotherapy    Personal history of radiation therapy     ROS:   All systems reviewed and negative except as noted in the HPI.   Past Surgical History:  Procedure Laterality Date   ABDOMINAL HYSTERECTOMY     BREAST LUMPECTOMY WITH RADIOACTIVE SEED AND SENTINEL LYMPH NODE BIOPSY Right 08/11/2020   Procedure: RIGHT BREAST LUMPECTOMY WITH RADIOACTIVE SEED AND SENTINEL LYMPH NODE BIOPSY;  Surgeon: Vernetta Berg, MD;  Location: [facility] OR;  Service: General;  Laterality: Right;   CARDIAC CATHETERIZATION     in [facility], clean, per pt.   COLONOSCOPY WITH PROPOFOL  N/A 10/10/2021   Procedure: COLONOSCOPY WITH PROPOFOL ;  Surgeon: Saintclair Jasper, MD;  Location: [facility];  Service: Gastroenterology;  Laterality: N/A;   ICD IMPLANT N/A 06/18/2019   Procedure: ICD IMPLANT;  Surgeon: Waddell Danelle ORN, MD;  Location: [facility] INVASIVE CV LAB;  Service: Cardiovascular;  Laterality: N/A;   POLYPECTOMY  10/10/2021   Procedure: POLYPECTOMY;  Surgeon: Saintclair Jasper, MD;  Location: [facility];  Service: Gastroenterology;;   [facility] REMOVAL N/A 01/31/2021   Procedure: REMOVAL PORT-A-CATH;  Surgeon: Vernetta Berg, MD;  Location: [facility] ORS;  Service: General;  Laterality: N/A;   PORTACATH PLACEMENT N/A 08/31/2020   Procedure: INSERTION PORT-A-CATH WITH ULTRASOUND GUIDANCE;  Surgeon: Vernetta Berg, MD;  Location: [facility] OR;  Service: General;  Laterality: N/A;   RIGHT/LEFT HEART CATH AND CORONARY ANGIOGRAPHY N/A 11/17/2018   Procedure: RIGHT/LEFT HEART CATH AND CORONARY ANGIOGRAPHY;  Surgeon: Claudene Victory ORN, MD;  Location: [facility] INVASIVE CV LAB;  Service: Cardiovascular;  Laterality: N/A;     Family History  Problem Relation Age of Onset   Diabetes Mellitus I Mother    Lung cancer Mother 65   Breast cancer Mother        dx. late 30s/early 23s   Diabetes Mellitus I Father    Sudden death Father 17   Prostate cancer Brother 65   Hypertension Brother     Hypertension Brother    Diabetes Mellitus I Brother    Benign prostatic hyperplasia Brother    Heart failure Paternal Uncle    Thyroid  cancer Niece        dx. in her 30s   Breast cancer Cousin        dx. in her 16s, recurrence in her 6s (maternal first cousin)   Breast cancer Cousin        female dx. in his early 67s (paternal first cousin)   Cancer Cousin        dx. in  his early 71s, unknown type (paternal first cousin)   Cancer Cousin 62       unknown type (paternal first cousin)     Social History   Socioeconomic History   Marital status: Single    Spouse name: Not on file   Number of children: Not on file   Years of education: 16   Highest education level: Bachelor's degree (e.g., BA, AB, BS)  Occupational History   Occupation: Special educational needs teacher: [place]  Tobacco Use   Smoking status: Never   Smokeless tobacco: Never  Vaping Use   Vaping status: Never Used  Substance and Sexual Activity   Alcohol use: Yes    Alcohol/week: 0.0 standard drinks of alcohol    Comment: less than once a month   Drug use: No   Sexual activity: Never  Other Topics Concern   Not on file  Social History Narrative   Works at [employer].  Lives alone.     Social Drivers of Corporate investment banker Strain: Low Risk  (07/06/2020)   Overall Financial Resource Strain (CARDIA)    Difficulty of Paying Living Expenses: Not hard at all  Food Insecurity: No Food Insecurity (10/28/2020)   Hunger Vital Sign    Worried About Running Out of Food in the Last Year: Never true    Ran Out of Food in the Last Year: Never true  Transportation Needs: No Transportation Needs (10/28/2020)   PRAPARE - Administrator, Civil Service (Medical): No    Lack of Transportation (Non-Medical): No  Physical Activity: Inactive (11/03/2019)   Exercise Vital Sign    Days of Exercise per Week: 0 days    Minutes of Exercise per Session: 0 min  Stress: Stress Concern Present (11/03/2019)    [employer] of Occupational Health - Occupational Stress Questionnaire    Feeling of Stress : Very much  Social Connections: Not on file  Intimate Partner Violence: Not At Risk (12/06/2020)   Humiliation, Afraid, Rape, and Kick questionnaire    Fear of Current or Ex-Partner: No    Emotionally Abused: No    Physically Abused: No    Sexually Abused: No     BP 110/68   Pulse 82   Ht 5' 5.5 (1.664 m)   Wt 129 lb (58.5 kg)   SpO2 95%   BMI 21.14 kg/m   Physical Exam:  Well appearing NAD HEENT: Unremarkable Neck:  No JVD, no thyromegally Lymphatics:  No adenopathy Back:  No CVA tenderness Lungs:  Clear HEART:  Regular rate rhythm, no murmurs, no rubs, no clicks Abd:  soft, positive bowel sounds, no organomegally, no rebound, no guarding Ext:  2 plus pulses, no edema, no cyanosis, no clubbing Skin:  No rashes no nodules Neuro:  CN II through XII intact, motor grossly intact  EKG - nsr with possible ASMI, age undetermined.  DEVICE  Normal device function.  See PaceArt for details.   Assess/Plan: Chronic systolic heart failure - she has class 2 symptoms on maximal medical therapy. Continue. ICD - she is s/p [employer] ICD and is doing well.   Danelle Deja Kaigler,MD

## 2024-09-11 NOTE — Patient Instructions (Signed)

## 2024-09-16 ENCOUNTER — Other Ambulatory Visit (HOSPITAL_COMMUNITY): Payer: Self-pay | Admitting: Cardiology

## 2024-09-18 ENCOUNTER — Other Ambulatory Visit (HOSPITAL_COMMUNITY): Payer: Self-pay

## 2024-09-18 MED ORDER — SPIRONOLACTONE 25 MG PO TABS
25.0000 mg | ORAL_TABLET | Freq: Every evening | ORAL | 0 refills | Status: DC
  Filled 2024-09-18: qty 30, 30d supply, fill #0

## 2024-09-18 MED ORDER — CARVEDILOL 25 MG PO TABS
25.0000 mg | ORAL_TABLET | Freq: Two times a day (BID) | ORAL | 0 refills | Status: DC
  Filled 2024-09-18: qty 60, 30d supply, fill #0

## 2024-09-21 ENCOUNTER — Inpatient Hospital Stay: Payer: PPO | Attending: Hematology and Oncology | Admitting: Hematology and Oncology

## 2024-09-21 ENCOUNTER — Other Ambulatory Visit (HOSPITAL_COMMUNITY): Payer: Self-pay

## 2024-09-21 VITALS — BP 110/80 | HR 85 | Temp 97.7°F | Resp 18 | Ht 65.5 in | Wt 129.7 lb

## 2024-09-21 DIAGNOSIS — Z170 Estrogen receptor positive status [ER+]: Secondary | ICD-10-CM | POA: Insufficient documentation

## 2024-09-21 DIAGNOSIS — C50511 Malignant neoplasm of lower-outer quadrant of right female breast: Secondary | ICD-10-CM | POA: Insufficient documentation

## 2024-09-21 DIAGNOSIS — Z923 Personal history of irradiation: Secondary | ICD-10-CM | POA: Diagnosis not present

## 2024-09-21 DIAGNOSIS — Z79811 Long term (current) use of aromatase inhibitors: Secondary | ICD-10-CM | POA: Diagnosis not present

## 2024-09-21 MED ORDER — ANASTROZOLE 1 MG PO TABS
1.0000 mg | ORAL_TABLET | Freq: Every day | ORAL | 3 refills | Status: AC
  Filled 2024-09-21: qty 90, 90d supply, fill #0
  Filled 2024-12-23: qty 90, 90d supply, fill #1

## 2024-09-21 NOTE — Progress Notes (Signed)
 Patient Care Team: Sun, [name], MD as PCP - General (Family Medicine) Claudene Victory ORN, MD (Inactive) as PCP - Cardiology (Cardiology) Rolan Ezra RAMAN, MD as PCP - Advanced Heart Failure (Cardiology) Vernetta Berg, MD as Consulting Physician (General Surgery) Odean Potts, MD as Consulting Physician (Hematology and Oncology) Dewey Rush, MD as Consulting Physician (Radiation Oncology)  DIAGNOSIS:  Encounter Diagnosis  Name Primary?   Malignant neoplasm of lower-outer quadrant of right breast of female, estrogen receptor positive ([name]) Yes    SUMMARY OF ONCOLOGIC HISTORY: Oncology History  Malignant neoplasm of lower-outer quadrant of right breast of female, estrogen receptor positive ([name])  07/01/2020 Initial Diagnosis   Screening mammogram showed a right breast mass. Mammogram and US  showed a 0.7cm mass at the 6 o'clock position in the right breast, no axillary adenopathy. 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    07/06/2020 Cancer Staging   Staging form: Breast, AJCC 8th Edition - Clinical stage from 07/06/2020: Stage IB (cT1b, cN0, cM0, G3, ER+, PR-, HER2-)   07/26/2020 Genetic Testing   Negative genetic testing:  No pathogenic variants detected on the [facility] Common Hereditary Cancers Panel. The report date is 07/26/2020.   The Common Hereditary Cancers Panel offered by [facility] includes sequencing and/or deletion duplication testing of the following 48 genes: APC, ATM, AXIN2, BARD1, BMPR1A, BRCA1, BRCA2, BRIP1, CDH1, CDK4, CDKN2A (p14ARF), CDKN2A (p16INK4a), CHEK2, CTNNA1, DICER1, EPCAM (Deletion/duplication testing only), GREM1 (promoter region deletion/duplication testing only), KIT, MEN1, MLH1, MSH2, MSH3, MSH6, MUTYH, NBN, NF1, NTHL1, PALB2, PDGFRA, PMS2, POLD1, POLE, PTEN, RAD50, RAD51C, RAD51D, RNF43, SDHB, SDHC, SDHD, SMAD4, SMARCA4. STK11, TP53, TSC1, TSC2, and VHL.  The following genes were  evaluated for sequence changes only: SDHA and HOXB13 c.251G>A variant only.   08/11/2020 Surgery   Right lumpectomy Jan) [id number]): IDC, grade 3, 0.8cm, clear margins, 3 right axillary lymph nodes negative for carcinoma. ER 30% weak, PR 0%, HER-2 positive, Ki-67 40%   08/18/2020 Cancer Staging   Staging form: Breast, AJCC 8th Edition - Pathologic stage from 08/18/2020: Stage IA (pT1b, pN0, cM0, G3, ER+, PR-, HER2+)   09/08/2020 -  Adjuvant Chemotherapy   Weekly Taxol /herceptin  x 12 (09/08/2020 - 11/24/2020). Herceptin  was stopped after first 5 weekly cycles (last received on 10/06/2020) due to decline in EF from 40% to 30%.     12/15/2020 - 01/11/2021 Radiation Therapy   The patient initially received a dose of 42.56 Gy in 16 fractions to the breast using whole-breast tangent fields. This was delivered using a 3-D conformal technique. The pt received a boost delivering an additional 8 Gy in 4 fractions using a electron boost with electrons. The total dose was 50.56 Gy.   12/2020 -  Anti-estrogen oral therapy   Anastrozole      CHIEF COMPLIANT: Follow-up on anastrozole  therapy  HISTORY OF PRESENT ILLNESS:  History of Present Illness Toni [name] is a 68 year old female who presents for follow-up on anastrozole  therapy.  She has been on anastrozole  therapy since March 2022 without experiencing adverse effects such as hot flashes, joint stiffness, or achiness. Her weight has decreased from 141 pounds in October of the previous year to 129 pounds currently, attributed to dietary changes and increased exercise. The reduction in her losartan  dosage has allowed her to resume exercising, contributing to her weight loss.     ALLERGIES:  is allergic to biaxin [clarithromycin] and vasotec [enalapril].  MEDICATIONS:  Current Outpatient Medications  Medication Sig Dispense Refill   acetaminophen  (TYLENOL ) 500 MG tablet Take 1,000 mg by mouth every 6 (six) hours as needed for  moderate pain or headache.     anastrozole  (ARIMIDEX ) 1 MG tablet Take 1 tablet (1 mg total) by mouth daily. 90 tablet 3   aspirin  81 MG chewable tablet Chew 1 tablet (81 mg total) by mouth daily. 30 tablet 0   carvedilol  (COREG ) 25 MG tablet Take 1 tablet (25 mg total) by mouth 2 (two) times daily with a meal. PLEASE SCHEDULE APPOINTMENT FOR MORE REFILLS 60 tablet 0   cetirizine (ZYRTEC) 10 MG tablet Take 10 mg by mouth at bedtime.     Continuous Blood Gluc Receiver (FREESTYLE LIBRE 14 DAY READER) DEVI Apply topically as directed.     Continuous Glucose Sensor (FREESTYLE LIBRE 14 DAY SENSOR) MISC Use 1 sensor to monitor blood sugar levels change every 14 (fourteen) days. 2 each 6   Continuous Glucose Sensor (FREESTYLE LIBRE 14 DAY SENSOR) MISC Apply 1 sensor every 14 days to monitor blood glucose. Remove old sensor before applying a new one 2 each 6   digoxin  (LANOXIN ) 0.125 MG tablet Take 0.5 tablets (0.0625 mg total) by mouth daily. 45 tablet 3   dimenhyDRINATE (DRIMINATE) 50 MG tablet Take 50 mg by mouth every 8 (eight) hours as needed.     empagliflozin  (JARDIANCE ) 25 MG TABS tablet Take 1 tablet (25 mg total) by mouth in the morning. 90 tablet 3   furosemide  (LASIX ) 20 MG tablet Take 20 mg by mouth as needed.     glipiZIDE  (GLUCOTROL  XL) 10 MG 24 hr tablet Take 2 tablets (20 mg total) by mouth daily. 180 tablet 3   hydrALAZINE  (APRESOLINE ) 50 MG tablet Take 1.5 tablets (75 mg total) by mouth 3 (three) times daily. PLEASE SCHEDULE APPOINTMENT FOR MORE REFILLS 180 tablet 0   isosorbide  dinitrate (ISORDIL ) 20 MG tablet Take 2 tablets (40 mg total) by mouth 3 (three) times daily. PLEASE SCHEDULE APPOINTMENT FOR MORE REFILLS 540 tablet 0   losartan  (COZAAR ) 50 MG tablet Take 1.5 tablets (75 mg total) by mouth 2 (two) times daily. (Patient taking differently: Take 50 mg by mouth 2 (two) times daily.) 90 tablet 3   metFORMIN  (GLUCOPHAGE ) 1000 MG tablet Take 1 tablet (1,000 mg total) by mouth 2 (two)  times daily with a meal. 180 tablet 3   Multiple Vitamin (MULTIVITAMIN WITH MINERALS) TABS tablet Take 1 tablet by mouth daily. Centrum Silver     Semaglutide , 2 MG/DOSE, (OZEMPIC , 2 MG/DOSE,) 8 MG/3ML SOPN Inject 2 mg into the skin once a week. 9 mL 3   simvastatin  (ZOCOR ) 20 MG tablet Take 1 tablet (20 mg total) by mouth daily. PLEASE SCHEDULE APPOINTMENT FOR MORE REFILLS 90 tablet 0   spironolactone  (ALDACTONE ) 25 MG tablet Take 1 tablet (25 mg total) by mouth every evening. PLEASE SCHEDULE APPOINTMENT FOR MORE REFILLS 30 tablet 0   TRUE METRIX BLOOD GLUCOSE TEST test strip 1 each by Other route as directed.   5   TRUEPLUS LANCETS 30G MISC 1 each by Other route as directed. Use as directed.  5   UNIFINE PENTIPS 32G X 4 MM MISC 1 each by Other route as directed.      No current facility-administered medications for this visit.    PHYSICAL EXAMINATION: ECOG PERFORMANCE STATUS: 1 - Symptomatic but completely ambulatory  There were no vitals filed for this visit. There were no vitals filed for this  visit.  Physical Exam MEASUREMENTS: Weight- 129. BREAST: Breasts normal.  (exam performed in the presence of a chaperone)  LABORATORY DATA:  I have reviewed the data as listed    Latest Ref Rng & Units 11/28/2023   10:22 AM 07/23/2023   10:17 AM 03/22/2023    9:42 AM  CMP  Glucose 70 - 99 mg/dL 828  877  875   BUN 8 - 23 mg/dL 12  18  14    Creatinine 0.44 - 1.00 mg/dL 9.32  9.23  9.10   Sodium 135 - 145 mmol/L 137  139  138   Potassium 3.5 - 5.1 mmol/L 4.3  4.2  4.5   Chloride 98 - 111 mmol/L 102  104  103   CO2 22 - 32 mmol/L 27  26  27    Calcium 8.9 - 10.3 mg/dL 9.6  89.7  9.7     Lab Results  Component Value Date   WBC 5.5 01/27/2021   HGB 14.1 01/27/2021   HCT 44.9 01/27/2021   MCV 95.5 01/27/2021   PLT 265 01/27/2021   NEUTROABS 2.3 11/24/2020    ASSESSMENT & PLAN:  Malignant neoplasm of lower-outer quadrant of right breast of female, estrogen receptor positive  ([name]) 07/01/2020:Screening mammogram showed a right breast mass. Mammogram and US  showed a 0.7cm mass at the 6 o'clock position in the right breast, no axillary adenopathy. 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  T1BN0 stage Ia   08/11/2020:Right lumpectomy Jan): IDC, grade 3, 0.8cm, clear margins, 3 right axillary lymph nodes negative for carcinoma.  ER 30% week, PR 0%, HER-2 positive, Ki-67 40%   Treatment plan: 1. adjuvant Taxol  Herceptin /Herceptin  discontinued after 5 cycles due to worsening cardiomyopathy, Taxol  completed 11/24/20 2.  Adjuvant radiation therapy completed 01/11/21 3.  Follow-up adjuvant antiestrogen therapy Patient is participating in the neuropathy clinical trial SWOG S1714 --------------------------------------------------------------------------------------------------------------------------------------- Current treatment: Anti estrogen therapy with anastrozole  01/13/2021   She has a grade 1 peripheral sensory neuropathy.   echocardiogram on 12/20/2020. EF 25-30%: She follows with Dr. McClain.  Herceptin  discontinued   Anastrozole  toxicities: Hot flashes: But they are not bothering her significantly Joint stiffness: Resolved after taking it at bedtime   Breast cancer surveillance: 1.  Breast exam 09/21/2024: Benign 2. mammogram 01/16/2024: Benign breast density category B,  bone density T score -0.4: Normal   General Health Maintenance: Patient is losing weight because of eating healthy food and exercising regularly. RTC in 1 year for follow-up     No orders of the defined types were placed in this encounter.  The patient has a good understanding of the overall plan. she agrees with it. she will call with any problems that may develop before the next visit here.  I personally spent a total of 30 minutes in the care of the patient today including preparing to see the patient,  getting/reviewing separately obtained history, performing a medically appropriate exam/evaluation, counseling and educating, placing orders, referring and communicating with other health care professionals, documenting clinical information in the EHR, independently interpreting results, communicating results, and coordinating care.   Viinay K Drea Jurewicz, MD 09/21/24

## 2024-09-21 NOTE — Assessment & Plan Note (Signed)
 07/01/2020:Screening mammogram showed a right breast mass. Mammogram and US  showed a 0.7cm mass at the 6 o'clock position in the right breast, no axillary adenopathy. 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  T1BN0 stage Ia   08/11/2020:Right lumpectomy Jan): IDC, grade 3, 0.8cm, clear margins, 3 right axillary lymph nodes negative for carcinoma.  ER 30% week, PR 0%, HER-2 positive, Ki-67 40%   Treatment plan: 1. adjuvant Taxol  Herceptin /Herceptin  discontinued after 5 cycles due to worsening cardiomyopathy, Taxol  completed 11/24/20 2.  Adjuvant radiation therapy completed 01/11/21 3.  Follow-up adjuvant antiestrogen therapy Patient is participating in the neuropathy clinical trial SWOG S1714 --------------------------------------------------------------------------------------------------------------------------------------- Current treatment: Anti estrogen therapy with anastrozole  01/13/2021   She has a grade 1 peripheral sensory neuropathy.   echocardiogram on 12/20/2020. EF 25-30%: She follows with Dr. McClain.  Herceptin  discontinued   Anastrozole  toxicities: Hot flashes: But they are not bothering her significantly Joint stiffness: Resolved after taking it at bedtime   Breast cancer surveillance: 1.  Breast exam 09/21/2024: Benign 2. mammogram 01/16/2024: Benign breast density category B,  bone density T score -0.4: Normal   General Health Maintenance Significant weight loss since last visit. -Encourage continued healthy lifestyle habits.     RTC in 1 year for follow-up

## 2024-10-05 ENCOUNTER — Other Ambulatory Visit (HOSPITAL_COMMUNITY): Payer: Self-pay

## 2024-10-05 ENCOUNTER — Other Ambulatory Visit: Payer: Self-pay

## 2024-10-05 ENCOUNTER — Other Ambulatory Visit (HOSPITAL_COMMUNITY): Payer: Self-pay | Admitting: Cardiology

## 2024-10-05 ENCOUNTER — Ambulatory Visit: Payer: HMO

## 2024-10-05 DIAGNOSIS — I428 Other cardiomyopathies: Secondary | ICD-10-CM | POA: Diagnosis not present

## 2024-10-05 MED ORDER — LOSARTAN POTASSIUM 50 MG PO TABS
75.0000 mg | ORAL_TABLET | Freq: Two times a day (BID) | ORAL | 0 refills | Status: DC
  Filled 2024-10-05: qty 90, 30d supply, fill #0

## 2024-10-05 MED ORDER — HYDRALAZINE HCL 50 MG PO TABS
75.0000 mg | ORAL_TABLET | Freq: Three times a day (TID) | ORAL | 0 refills | Status: DC
  Filled 2024-10-05: qty 180, 40d supply, fill #0

## 2024-10-05 MED FILL — Digoxin Tab 125 MCG (0.125 MG): ORAL | 90 days supply | Qty: 45 | Fill #1 | Status: AC

## 2024-10-06 ENCOUNTER — Other Ambulatory Visit (HOSPITAL_COMMUNITY): Payer: Self-pay

## 2024-10-06 LAB — CUP PACEART REMOTE DEVICE CHECK
Battery Remaining Longevity: 114 mo
Battery Remaining Percentage: 82 %
Brady Statistic RV Percent Paced: 0 %
Date Time Interrogation Session: 20251117050100
HighPow Impedance: 68 Ohm
Implantable Lead Connection Status: 753985
Implantable Lead Implant Date: 20200730
Implantable Lead Location: 753860
Implantable Lead Model: 292
Implantable Lead Serial Number: 447014
Implantable Pulse Generator Implant Date: 20200730
Lead Channel Impedance Value: 436 Ohm
Lead Channel Pacing Threshold Amplitude: 1.3 V
Lead Channel Pacing Threshold Pulse Width: 0.4 ms
Lead Channel Setting Pacing Amplitude: 3 V
Lead Channel Setting Pacing Pulse Width: 0.4 ms
Lead Channel Setting Sensing Sensitivity: 0.5 mV
Pulse Gen Serial Number: 266091
Zone Setting Status: 755011

## 2024-10-07 ENCOUNTER — Ambulatory Visit: Payer: Self-pay | Admitting: Internal Medicine

## 2024-10-07 ENCOUNTER — Other Ambulatory Visit (HOSPITAL_COMMUNITY): Payer: Self-pay

## 2024-10-07 MED ORDER — FREESTYLE LIBRE 14 DAY SENSOR MISC
4 refills | Status: AC
  Filled 2024-10-07: qty 2, 28d supply, fill #0
  Filled 2024-10-31: qty 2, 28d supply, fill #1
  Filled 2024-12-06: qty 2, 28d supply, fill #2

## 2024-10-07 NOTE — Progress Notes (Signed)
 Remote ICD Transmission

## 2024-10-08 ENCOUNTER — Other Ambulatory Visit (HOSPITAL_COMMUNITY): Payer: Self-pay

## 2024-10-11 ENCOUNTER — Other Ambulatory Visit (HOSPITAL_COMMUNITY): Payer: Self-pay | Admitting: Cardiology

## 2024-10-12 ENCOUNTER — Other Ambulatory Visit: Payer: Self-pay

## 2024-10-12 ENCOUNTER — Other Ambulatory Visit (HOSPITAL_COMMUNITY): Payer: Self-pay

## 2024-10-12 MED ORDER — SIMVASTATIN 20 MG PO TABS
20.0000 mg | ORAL_TABLET | Freq: Every day | ORAL | 0 refills | Status: DC
  Filled 2024-10-12: qty 60, 60d supply, fill #0

## 2024-10-20 ENCOUNTER — Other Ambulatory Visit (HOSPITAL_COMMUNITY): Payer: Self-pay

## 2024-10-20 ENCOUNTER — Other Ambulatory Visit (HOSPITAL_COMMUNITY): Payer: Self-pay | Admitting: Cardiology

## 2024-10-20 MED ORDER — CARVEDILOL 25 MG PO TABS
25.0000 mg | ORAL_TABLET | Freq: Two times a day (BID) | ORAL | 0 refills | Status: DC
  Filled 2024-10-20: qty 60, 30d supply, fill #0

## 2024-10-20 MED ORDER — SPIRONOLACTONE 25 MG PO TABS
25.0000 mg | ORAL_TABLET | Freq: Every evening | ORAL | 0 refills | Status: DC
  Filled 2024-10-20: qty 30, 30d supply, fill #0

## 2024-10-27 DIAGNOSIS — H5213 Myopia, bilateral: Secondary | ICD-10-CM | POA: Diagnosis not present

## 2024-10-27 DIAGNOSIS — H524 Presbyopia: Secondary | ICD-10-CM | POA: Diagnosis not present

## 2024-10-27 DIAGNOSIS — H40013 Open angle with borderline findings, low risk, bilateral: Secondary | ICD-10-CM | POA: Diagnosis not present

## 2024-10-27 DIAGNOSIS — H52223 Regular astigmatism, bilateral: Secondary | ICD-10-CM | POA: Diagnosis not present

## 2024-10-27 DIAGNOSIS — D3132 Benign neoplasm of left choroid: Secondary | ICD-10-CM | POA: Diagnosis not present

## 2024-11-16 ENCOUNTER — Other Ambulatory Visit: Payer: Self-pay

## 2024-11-16 ENCOUNTER — Other Ambulatory Visit (HOSPITAL_COMMUNITY): Payer: Self-pay

## 2024-11-16 ENCOUNTER — Other Ambulatory Visit (HOSPITAL_COMMUNITY): Payer: Self-pay | Admitting: Cardiology

## 2024-11-16 MED ORDER — ISOSORBIDE DINITRATE 20 MG PO TABS
40.0000 mg | ORAL_TABLET | Freq: Three times a day (TID) | ORAL | 0 refills | Status: AC
  Filled 2024-11-16: qty 540, 90d supply, fill #0

## 2024-11-16 MED ORDER — SPIRONOLACTONE 25 MG PO TABS
25.0000 mg | ORAL_TABLET | Freq: Every evening | ORAL | 0 refills | Status: DC
  Filled 2024-11-16 – 2024-11-17 (×2): qty 30, 30d supply, fill #0

## 2024-11-16 MED ORDER — CARVEDILOL 25 MG PO TABS
25.0000 mg | ORAL_TABLET | Freq: Two times a day (BID) | ORAL | 0 refills | Status: DC
  Filled 2024-11-16: qty 60, 30d supply, fill #0

## 2024-11-16 MED ORDER — LOSARTAN POTASSIUM 50 MG PO TABS
75.0000 mg | ORAL_TABLET | Freq: Two times a day (BID) | ORAL | 0 refills | Status: DC
  Filled 2024-11-16: qty 90, 30d supply, fill #0

## 2024-11-16 MED ORDER — HYDRALAZINE HCL 50 MG PO TABS
75.0000 mg | ORAL_TABLET | Freq: Three times a day (TID) | ORAL | 0 refills | Status: DC
  Filled 2024-11-16: qty 180, 40d supply, fill #0

## 2024-11-17 ENCOUNTER — Other Ambulatory Visit: Payer: Self-pay

## 2024-11-17 ENCOUNTER — Other Ambulatory Visit (HOSPITAL_COMMUNITY): Payer: Self-pay

## 2024-11-20 ENCOUNTER — Other Ambulatory Visit: Payer: Self-pay

## 2024-11-20 ENCOUNTER — Other Ambulatory Visit (HOSPITAL_COMMUNITY): Payer: Self-pay

## 2024-11-20 MED ORDER — OZEMPIC (2 MG/DOSE) 8 MG/3ML ~~LOC~~ SOPN
2.0000 mg | PEN_INJECTOR | SUBCUTANEOUS | 1 refills | Status: AC
  Filled 2024-11-20: qty 3, 28d supply, fill #0
  Filled 2024-12-13: qty 3, 28d supply, fill #1

## 2024-12-07 ENCOUNTER — Other Ambulatory Visit (HOSPITAL_COMMUNITY): Payer: Self-pay

## 2024-12-09 ENCOUNTER — Ambulatory Visit (HOSPITAL_COMMUNITY): Payer: Self-pay | Admitting: Cardiology

## 2024-12-09 ENCOUNTER — Ambulatory Visit (HOSPITAL_COMMUNITY)
Admission: RE | Admit: 2024-12-09 | Discharge: 2024-12-09 | Disposition: A | Discharge status: Home / Self Care | Source: Ambulatory Visit | Attending: Cardiology | Admitting: Cardiology

## 2024-12-09 ENCOUNTER — Encounter (HOSPITAL_COMMUNITY): Payer: Self-pay | Admitting: Cardiology

## 2024-12-09 VITALS — BP 102/60 | HR 86 | Wt 130.2 lb

## 2024-12-09 DIAGNOSIS — I5022 Chronic systolic (congestive) heart failure: Secondary | ICD-10-CM | POA: Diagnosis present

## 2024-12-09 DIAGNOSIS — Z1722 Progesterone receptor negative status: Secondary | ICD-10-CM | POA: Insufficient documentation

## 2024-12-09 DIAGNOSIS — Z853 Personal history of malignant neoplasm of breast: Secondary | ICD-10-CM | POA: Diagnosis not present

## 2024-12-09 DIAGNOSIS — Z7984 Long term (current) use of oral hypoglycemic drugs: Secondary | ICD-10-CM | POA: Diagnosis not present

## 2024-12-09 DIAGNOSIS — Z79899 Other long term (current) drug therapy: Secondary | ICD-10-CM | POA: Diagnosis not present

## 2024-12-09 DIAGNOSIS — Z7985 Long-term (current) use of injectable non-insulin antidiabetic drugs: Secondary | ICD-10-CM | POA: Diagnosis not present

## 2024-12-09 DIAGNOSIS — E119 Type 2 diabetes mellitus without complications: Secondary | ICD-10-CM | POA: Insufficient documentation

## 2024-12-09 DIAGNOSIS — I110 Hypertensive heart disease with heart failure: Secondary | ICD-10-CM | POA: Diagnosis not present

## 2024-12-09 DIAGNOSIS — E785 Hyperlipidemia, unspecified: Secondary | ICD-10-CM | POA: Diagnosis not present

## 2024-12-09 DIAGNOSIS — I428 Other cardiomyopathies: Secondary | ICD-10-CM | POA: Insufficient documentation

## 2024-12-09 DIAGNOSIS — Z95810 Presence of automatic (implantable) cardiac defibrillator: Secondary | ICD-10-CM | POA: Insufficient documentation

## 2024-12-09 DIAGNOSIS — I2510 Atherosclerotic heart disease of native coronary artery without angina pectoris: Secondary | ICD-10-CM | POA: Diagnosis not present

## 2024-12-09 DIAGNOSIS — Z170 Estrogen receptor positive status [ER+]: Secondary | ICD-10-CM | POA: Insufficient documentation

## 2024-12-09 DIAGNOSIS — Z1731 Human epidermal growth factor receptor 2 positive status: Secondary | ICD-10-CM | POA: Diagnosis not present

## 2024-12-09 LAB — BASIC METABOLIC PANEL WITH GFR
Anion gap: 10 (ref 5–15)
BUN: 12 mg/dL (ref 8–23)
CO2: 28 mmol/L (ref 22–32)
Calcium: 10.2 mg/dL (ref 8.9–10.3)
Chloride: 102 mmol/L (ref 98–111)
Creatinine, Ser: 0.77 mg/dL (ref 0.44–1.00)
GFR, Estimated: >60 mL/min
Glucose, Bld: 107 mg/dL — ABNORMAL HIGH (ref 70–99)
Potassium: 4.4 mmol/L (ref 3.5–5.1)
Sodium: 140 mmol/L (ref 135–145)

## 2024-12-09 LAB — DIGOXIN LEVEL: Digoxin Level: 0.8 ng/mL (ref 0.8–2.0)

## 2024-12-09 LAB — PRO BRAIN NATRIURETIC PEPTIDE: Pro Brain Natriuretic Peptide: 83.7 pg/mL

## 2024-12-09 NOTE — Patient Instructions (Signed)
 Medication Changes:  none  Lab Work:  Labs done today, your results will be available in MyChart, we will contact you for abnormal readings.  Testing/Procedures:  Your physician has requested that you have an echocardiogram. Echocardiography is a painless test that uses sound waves to create images of your heart. It provides your doctor with information about the size and shape of your heart and how well your hearts chambers and valves are working. This procedure takes approximately one hour. There are no restrictions for this procedure. Please do NOT wear cologne, perfume, aftershave, or lotions (deodorant is allowed). Please arrive 15 minutes prior to your appointment time.  Please note: We ask at that you not bring children with you during ultrasound (echo/ vascular) testing. Due to room size and safety concerns, children are not allowed in the ultrasound rooms during exams. Our front office staff cannot provide observation of children in our lobby area while testing is being conducted. An adult accompanying a patient to their appointment will only be allowed in the ultrasound room at the discretion of the ultrasound technician under special circumstances. We apologize for any inconvenience.   Referrals:  none  Special Instructions // Education:  none  Follow-Up in: as scheduled  At the [facility], you and your health needs are our priority. We have a designated team specialized in the treatment of Heart Failure. This Care Team includes your primary Heart Failure Specialized Cardiologist (physician), Advanced Practice Providers (APPs- Physician Assistants and Nurse Practitioners), and Pharmacist who all work together to provide you with the care you need, when you need it.   You may see any of the following providers on your designated Care Team at your next follow up:  Dr. Toribio Fuel Dr. Ezra Shuck Dr. Odis Brownie Greig Mosses, NP Caffie Shed,  [place] [facility] [place], [place] Beckey Coe, NP Jordan Lee, NP Tinnie Redman, PharmD   Please be sure to bring in all your medications bottles to every appointment.   Need to Contact Us :  If you have any questions or concerns before your next appointment please send us  a message through [place] or call our office at [phone].    TO LEAVE A MESSAGE FOR THE NURSE SELECT OPTION 2, PLEASE LEAVE A MESSAGE INCLUDING: YOUR NAME DATE OF BIRTH CALL BACK NUMBER REASON FOR CALL**this is important as we prioritize the call backs  YOU WILL RECEIVE A CALL BACK THE SAME DAY AS LONG AS YOU CALL BEFORE 4:00 PM

## 2024-12-10 NOTE — Progress Notes (Signed)
 "  ADVANCED HF CLINIC CONSULT NOTE  Primary Care: Sun, [name], MD Oncology: Dr. Odean HF Cardiology: Dr. Rolan  Chief Complaint: Heart Failure follow up   HPI:  Toni [name] is a 69 y.o. [employer] employee with a history of chronic systolic heart failure, due to nonischemic cardiomyopathy, HTN, DM, and hyperlipidemia.    Initially diagnosed with NICM in 1998 thought to be from HTN versus viral. Had cath in 1998 that was negative for coronary disease. EF at that time was 20% but EF recovered in 2012.    In October 2019, she had a cough/virus and she took OTC meds. Says she would feel better for a little while but then felt bad again. She has been working full time as artist at [employer] and prior to admission in 12/19 she had noticed increased fatigue and dyspnea.  She presented to [facility] ED on 11/13/18 with increased shortness of breath. She was admitted and echo was completed showing EF had gone back down to 15%. She had RHC/LHC with nonobstructive CAD and relatively preserved cardiac output. She was diuresed in the hospital and discharged. CPX in 2/20 showed only mild HF limitation.   Given family history of cardiomyopathy, I sent genetic testing.  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  I had her see Dr. Fairy, we think that the variant may be benign and unrelated to her cardiomyopathy.  She has no history of conduction disturbance.   Echo in 6/20 showed that EF remains low at 25-30%, mild LV dilation, mildly decreased RV systolic function.  She had a Environmental Manager ICD placed.   8/21 diagnosed with right breast cancer, ER+/PR-/HER2+.  She had right lumpectomy.   Echo in 10/21 showed EF 40% with mildly decreased RV systolic function. She has been getting Taxol  + Herceptin .  Repeat echo in 11/21 showed EF 30% with mild LV dilation, mildly decreased RV systolic function. Herceptin  was stopped and it was decided that she would be a poor candidate  for it in the future.   Echo in 1/22 showed EF 25-30%, diffuse hypokinesis, mildly decreased RV systolic function. CPX in 3/22 showed mild HF limitation.   Echo 3/23 showed EF 20-25%, mildly decreased RV systolic function, mild MR.   Echo in 9/24 showed EF 25, G1DD, RV normal, mild MR  Today she returns for HF follow up. She had to decrease losartan  back to 50 mg bid due to low BP.  She is otherwise tolerating her medication regimen.  Weight down 6 lbs. No exertional dyspnea or chest pain.  No lightheadedness. No palpitations.    [employer] device interrogation (personally reviewed): Heartlogic score 1    Labs (9/24): K 4.2, creatinine 0.76, digoxin  0.4 Labs (1/25): K 4.3, creatinine 0.67, BNP 49, digoxin  0.4  ECG (personally reviewed): NSR, LVH with repolarization abnormality.   PMH: 1. HTN 2. Type 2 diabetes 3. Hyperlipidemia 4. Chronic systolic CHF: Nonischemic cardiomyopathy.  Diagnosed in 1998, EF 20% by echo at that time.  Echo back to normal range by 2012.   - LHC/RHC (12/19): D1 60-70% stenosis; mean RA 6, PA 58/22, mean PCWP 22, CI 2.9.  - Echo (12/19): EF 15% with severe LV dilation, moderate central MR likely functional.  - Cardiac MRI (12/19): Moderate LV dilation with EF 14%, mild RV dilation with EF 17%, LGE at the inferior RV insertion site (nonspecific).  - CPX (2/20): peak VO2 18.6, VE/VCO2 31, RER 1.18 => mild HF limitation.  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  Suspect the variant is benign.  - Echo (6/20): EF 25-30%, mild LV dilation, mildly decreased RV systolic function.  - Echo (10/21): EF 40%, diffuse hypokinesis, mildly decreased RV systolic function.  - Echo (11/21): EF 30%, diffuse hypokinesis, mildly decreased RV systolic function.  - Echo (1/22): EF 25-30%, diffuse hypokinesis, mildly decreased RV systolic function.  - CPX (3/22): Peak VO2 22, VE/VCO2 slope 33, RER 1.13.  Mild HF  limitation.  - Echo (3/23): EF 20-25%, mildly decreased RV systolic function, mild MR.  - Echo (9/24): EF 25%, G1DD, RV normal, mild MR 5. Angioedema with ACEI 6. Left shoulder adhesive capsulitis 7. Depression 8. Breast cancer: 8/21 diagnosed with right breast cancer, ER+/PR-/HER2+.  She had right lumpectomy.  Radiation ongoing.   Social History   Socioeconomic History   Marital status: Single    Spouse name: Not on file   Number of children: Not on file   Years of education: 16   Highest education level: Bachelor's degree (e.g., BA, AB, BS)  Occupational History   Occupation: Special Educational Needs Teacher: [employer]  Tobacco Use   Smoking status: Never   Smokeless tobacco: Never  Vaping Use   Vaping status: Never Used  Substance and Sexual Activity   Alcohol use: Yes    Alcohol/week: 0.0 standard drinks of alcohol    Comment: less than once a month   Drug use: No   Sexual activity: Never  Other Topics Concern   Not on file  Social History Narrative   Works at [employer].  Lives alone.     Social Drivers of Health   Tobacco Use: Low Risk (12/09/2024)   Patient History    Smoking Tobacco Use: Never    Smokeless Tobacco Use: Never    Passive Exposure: Not on file  Financial Resource Strain: Not on file  Food Insecurity: Not on file  Transportation Needs: Not on file  Physical Activity: Not on file  Stress: Not on file  Social Connections: Not on file  Intimate Partner Violence: Not on file  Depression (EYV7-0): Not on file  Alcohol Screen: Not on file  Housing: Not on file  Utilities: Not on file  Health Literacy: Not on file   Family History  Problem Relation Age of Onset   Diabetes Mellitus I Mother    Lung cancer Mother 44   Breast cancer Mother        dx. late 30s/early 17s   Diabetes Mellitus I Father    Sudden death Father 39   Prostate cancer Brother 94   Hypertension Brother    Hypertension Brother    Diabetes Mellitus I Brother    Benign  prostatic hyperplasia Brother    Heart failure Paternal Uncle    Thyroid  cancer Niece        dx. in her 30s   Breast cancer Cousin        dx. in her 27s, recurrence in her 6s (maternal first cousin)   Breast cancer Cousin        female dx. in his early 20s (paternal first cousin)   Cancer Cousin        dx. in his early 28s, unknown type (paternal first cousin)   Cancer Cousin 57       unknown type (paternal first cousin)   ROS: All systems reviewed and negative except as per HPI.   Current Outpatient Medications  Medication Sig Dispense Refill   acetaminophen  (TYLENOL ) 500 MG tablet Take 1,000 mg by mouth every 6 (six) hours as needed for moderate pain or headache.     anastrozole  (ARIMIDEX ) 1 MG tablet Take 1 tablet (1 mg total) by mouth daily. 90 tablet 3   aspirin  81 MG chewable tablet Chew 1 tablet (81 mg total) by mouth daily. 30 tablet 0   carvedilol  (COREG ) 25 MG tablet Take 1 tablet (25 mg total) by mouth 2 (two) times daily with a meal. PLEASE SCHEDULE APPOINTMENT FOR MORE REFILLS [phone] OPTION 2 60 tablet 0   cetirizine (ZYRTEC) 10 MG tablet Take 10 mg by mouth at bedtime.     Continuous Blood Gluc Receiver (FREESTYLE LIBRE 14 DAY READER) DEVI Apply topically as directed.     Continuous Glucose Sensor (FREESTYLE LIBRE 14 DAY SENSOR) MISC Use 1 sensor to monitor blood sugar levels change every 14 (fourteen) days. 2 each 6   Continuous Glucose Sensor (FREESTYLE LIBRE 14 DAY SENSOR) MISC Apply 1 Sensor every 14 days to monitor blood glucose. Remove old sensor before applying new one. 2 each 4   digoxin  (LANOXIN ) 0.125 MG tablet Take 0.5 tablets (0.0625 mg total) by mouth daily. 45 tablet 3   dimenhyDRINATE (DRIMINATE) 50 MG tablet Take 50 mg by mouth every 8 (eight) hours as needed.     empagliflozin  (JARDIANCE ) 25 MG TABS tablet Take 1 tablet (25 mg total) by mouth in the morning. 90 tablet 3   furosemide  (LASIX ) 20 MG tablet Take 20 mg by mouth as needed.     glipiZIDE   (GLUCOTROL  XL) 10 MG 24 hr tablet Take 2 tablets (20 mg total) by mouth daily. 180 tablet 3   hydrALAZINE  (APRESOLINE ) 50 MG tablet Take 1.5 tablets (75 mg total) by mouth 3 (three) times daily. PLEASE SCHEDULE APPOINTMENT FOR MORE REFILLS [phone] OPTION 2 180 tablet 0   isosorbide  dinitrate (ISORDIL ) 20 MG tablet Take 2 tablets (40 mg total) by mouth 3 (three) times daily. PLEASE SCHEDULE APPOINTMENT FOR MORE REFILLS 540 tablet 0   losartan  (COZAAR ) 50 MG tablet Take 1.5 tablets (75 mg total) by mouth 2 (two) times daily. PLEASE SCHEDULE APPOINTMENT FOR MORE REFILLS [phone] OPTION 2 (Patient taking differently: Take 50 mg by mouth 2 (two) times daily. PLEASE SCHEDULE APPOINTMENT FOR MORE REFILLS [phone] OPTION 2) 90 tablet 0   metFORMIN  (GLUCOPHAGE ) 1000 MG tablet Take 1 tablet (1,000 mg total) by mouth 2 (two) times daily with a meal. 180 tablet 3   Multiple Vitamin (MULTIVITAMIN WITH MINERALS) TABS tablet Take 1 tablet by mouth daily. Centrum Silver     Semaglutide , 2 MG/DOSE, (OZEMPIC , 2 MG/DOSE,) 8 MG/3ML SOPN Inject 2 mg into the skin once a week. 3 mL 1   simvastatin  (ZOCOR ) 20 MG tablet Take 1 tablet (20 mg total) by mouth daily. PLEASE SCHEDULE APPOINTMENT FOR MORE REFILLS [phone] OPTION 2 60 tablet 0   spironolactone  (ALDACTONE ) 25 MG tablet Take 1 tablet (25 mg total) by mouth every evening. PLEASE SCHEDULE APPOINTMENT FOR MORE REFILLS  [phone] OPTION 2 30 tablet 0   TRUEPLUS LANCETS 30G MISC 1 each by Other route as directed. Use as directed.  5   UNIFINE PENTIPS 32G X 4 MM MISC 1 each by Other route as directed.      No current facility-administered medications for this encounter.   BP 102/60   Pulse 86   Wt 59.1 kg (130 lb 3.2 oz)   SpO2 97%   BMI  21.34 kg/m   Filed Weights   12/09/24 1110  Weight: 59.1 kg (130 lb 3.2 oz)    General: NAD Neck: No JVD, no thyromegaly or thyroid  nodule.  Lungs: Clear to auscultation bilaterally with normal respiratory  effort. CV: Nondisplaced PMI.  Heart regular S1/S2, no S3/S4, no murmur.  No peripheral edema.  No carotid bruit.  Normal pedal pulses.  Abdomen: Soft, nontender, no hepatosplenomegaly, no distention.  Skin: Intact without lesions or rashes.  Neurologic: Alert and oriented x 3.  Psych: Normal affect. Extremities: No clubbing or cyanosis.  HEENT: Normal.   Assessment/Plan: 1. Chronic systolic CHF: Nonischemic cardiomyopathy by 12/19 cath.  Cardiac MRI with LV EF 14%, RV EF 17%. No definite evidence for myocarditis or infiltrative disease by delayed enhancement images.  Most likely cause of cardiomyopathy is familial versus prior viral myocarditis.  Brother also had a cardiomyopathy of uncertain etiology.  Genetic testing was done, showing an LMNA gene variant of uncertain significance => she saw Dr. Fairy, suspect benign/uninvolved variant (no conduction abnormality).  CPX in 2/20 showed only mild HF limitation. Echo in 6/20 showed EF 25-30%. Echo in 10/21 showed EF up some to 40%.  She now has a Environmental Manager ICD.  She was started on Taxol /Herceptin  chemotherapy for breast cancer and 11/21 echo showed fall in EF to 30%.  It was decided that she would not be a candidate for ongoing Herceptin  and this medication was stopped. Echo in 1/22 showed EF 25-30%, diffuse hypokinesis, mildly decreased RV systolic function.  CPX in 3/22 showed mild HF limitation. Echo 3/23 and 9/24 showed EF remains 20-25%. On exam and by Heartlogic, she is not volume overloaded.  Doing well symptomatically, NYHA class I.  Narrow QRS, not CRT candidate.  - Continue Coreg  25 mg bid.   - Continue losartan  to 50 mg bid (No Entresto  with history of ACEI angioedema). She did not tolerate losartan  75 mg bid due to low BP.  BMET/BNP today.  - Continue Jardiance  10 mg daily - Continue spironolactone  25 mg daily.  - Continue digoxin  0.625 mg daily, check level.  - Continue hydralazine  75 mg tid + isordil  40 mg tid.  - She has not  needed Lasix .  - Do not think she is symptomatic enough at this time for barostimulator activation therapy or cardiac contractility modulator.  - She is due for a repeat echo, I will arrange.  2. Type II diabetes: She is on Jardiance .  3. Breast cancer: HER2+. She started Herceptin  and Taxol  chemotherapy.  EF fell to 30% and Herceptin  was stopped. I do not think Herceptin  will be a good option for her going forwards.  Follow up in 4 months with APP  I spent 32 minutes reviewing records, interviewing/examining patient, and managing orders.    Ezra Shuck  12/10/2024 "

## 2024-12-13 ENCOUNTER — Other Ambulatory Visit (HOSPITAL_COMMUNITY): Payer: Self-pay | Admitting: Cardiology

## 2024-12-14 ENCOUNTER — Other Ambulatory Visit: Payer: Self-pay

## 2024-12-14 ENCOUNTER — Other Ambulatory Visit (HOSPITAL_COMMUNITY): Payer: Self-pay

## 2024-12-14 MED ORDER — SIMVASTATIN 20 MG PO TABS
20.0000 mg | ORAL_TABLET | Freq: Every day | ORAL | 0 refills | Status: AC
  Filled 2024-12-14: qty 60, 60d supply, fill #0

## 2024-12-14 MED ORDER — LOSARTAN POTASSIUM 50 MG PO TABS
75.0000 mg | ORAL_TABLET | Freq: Two times a day (BID) | ORAL | 0 refills | Status: AC
  Filled 2024-12-14: qty 90, 30d supply, fill #0

## 2024-12-16 ENCOUNTER — Other Ambulatory Visit (HOSPITAL_COMMUNITY): Payer: Self-pay | Admitting: Cardiology

## 2024-12-17 ENCOUNTER — Other Ambulatory Visit (HOSPITAL_COMMUNITY): Payer: Self-pay

## 2024-12-17 MED ORDER — SPIRONOLACTONE 25 MG PO TABS
25.0000 mg | ORAL_TABLET | Freq: Every evening | ORAL | 0 refills | Status: AC
  Filled 2024-12-17: qty 30, 30d supply, fill #0

## 2024-12-17 MED ORDER — CARVEDILOL 25 MG PO TABS
25.0000 mg | ORAL_TABLET | Freq: Two times a day (BID) | ORAL | 0 refills | Status: AC
  Filled 2024-12-17: qty 60, 30d supply, fill #0

## 2024-12-23 ENCOUNTER — Other Ambulatory Visit (HOSPITAL_COMMUNITY): Payer: Self-pay | Admitting: Cardiology

## 2024-12-24 ENCOUNTER — Other Ambulatory Visit: Payer: Self-pay

## 2024-12-24 ENCOUNTER — Other Ambulatory Visit (HOSPITAL_COMMUNITY): Payer: Self-pay

## 2024-12-24 MED ORDER — HYDRALAZINE HCL 50 MG PO TABS
75.0000 mg | ORAL_TABLET | Freq: Three times a day (TID) | ORAL | 2 refills | Status: AC
  Filled 2024-12-24: qty 180, 40d supply, fill #0

## 2024-12-25 ENCOUNTER — Ambulatory Visit (HOSPITAL_COMMUNITY): Admission: RE | Admit: 2024-12-25 | Source: Ambulatory Visit

## 2024-12-25 DIAGNOSIS — Z006 Encounter for examination for normal comparison and control in clinical research program: Secondary | ICD-10-CM

## 2024-12-25 DIAGNOSIS — I5022 Chronic systolic (congestive) heart failure: Secondary | ICD-10-CM

## 2024-12-25 LAB — ECHOCARDIOGRAM COMPLETE
Area-P 1/2: 5.02 cm2
Calc EF: 34.7 %
MV M vel: 4.4 m/s
MV Peak grad: 77.3 mmHg
S' Lateral: 4.8 cm
Single Plane A2C EF: 28.4 %
Single Plane A4C EF: 40.2 %

## 2024-12-25 NOTE — Research (Cosign Needed)
 SITE: [id number]     Subject # [id number]   Subprotocol: A  Inclusion Criteria  Patients who meet all of the following criteria are eligible for enrollment as study participants:  Yes No  Age > 70 years old   X   Eligible to wear Holter Study   X    Exclusion Criteria  Patients who meet any of these criteria are not eligible for enrollment as study participants: Yes No  1. Receiving any mechanical (respiratory or circulatory) or renal support therapy at Screening or during Visit #1.    X  2.  Any other conditions that in the opinion of the investigators are likely to prevent compliance with the study protocol or pose a safety concern if the subject participates in the study.    X  3. Poor tolerance, namely susceptible to severe skin allergies from ECG adhesive patch application.    X   Protocol: REV H    60 minute start window         Cor device must be applied, and the study initiated, no later than 60 minutes of completing the Echocardiogram                             HH:MM  Echo completion time   10:29  2.   Cor Study start time   10:40   30-Minute execution window  Once Cor Monitoring begins, 3 QT Med ECGs and the 15-minute rest period must be completed within a 30 minute window     HH:MM  3. QT Med ECG Completion time    10:55  4. Start of 15-Min sitting rest period    10:55  5. End of 15-Min rest period    11:10  6. Time of device removal    11:14   *Continue to use the Mobile App Event feature to log the Rest period windows and follow instructions on the EF-ACT Clinical Trial  Patient Instruction Card.  Describe any anomalies in Protocol execution in the Protocol Deviation Log    Residential Zip code [address] (First 3 digits ONLY)                                           [facility] Informed Consent   Subject Name: Toni [name]  Subject met inclusion and exclusion criteria.  The informed consent form, study requirements and expectations were reviewed with the subject. Subject  had opportunity to read consent and questions and concerns were addressed prior to the signing of the consent form.  The subject verbalized understanding of the trial requirements.  The subject agreed to participate in the [facility] EF ACT trial and signed the informed consent at 10:39 on 25-Dec-2024.  The informed consent was obtained prior to performance of any protocol-specific procedures for the subject.  A copy of the signed informed consent was given to the subject and a copy was placed in the subject's medical record.   Toni [name]         Current Medications[1]                                                                                                                        [  1]  Current Outpatient Medications:    acetaminophen  (TYLENOL ) 500 MG tablet, Take 1,000 mg by mouth every 6 (six) hours as needed for moderate pain or headache., Disp: , Rfl:    anastrozole  (ARIMIDEX ) 1 MG tablet, Take 1 tablet (1 mg total) by mouth daily., Disp: 90 tablet, Rfl: 3   aspirin  81 MG chewable tablet, Chew 1 tablet (81 mg total) by mouth daily., Disp: 30 tablet, Rfl: 0   carvedilol  (COREG ) 25 MG tablet, Take 1 tablet (25 mg total) by mouth 2 (two) times daily with a meal. PLEASE SCHEDULE APPOINTMENT FOR MORE REFILLS [phone] OPTION 2, Disp: 60 tablet, Rfl: 0   cetirizine (ZYRTEC) 10 MG tablet, Take 10 mg by mouth at bedtime., Disp: , Rfl:    Continuous Blood Gluc Receiver (FREESTYLE LIBRE 14 DAY READER) DEVI, Apply topically as directed., Disp: , Rfl:    Continuous Glucose Sensor (FREESTYLE LIBRE 14 DAY SENSOR) MISC, Use 1 sensor to monitor blood sugar levels change every 14 (fourteen) days., Disp: 2 each, Rfl: 6   Continuous Glucose Sensor (FREESTYLE LIBRE 14 DAY SENSOR) MISC, Apply 1 Sensor every 14 days to monitor blood glucose. Remove old sensor before applying new one., Disp: 2 each, Rfl: 4   digoxin  (LANOXIN ) 0.125 MG tablet, Take 0.5 tablets (0.0625 mg total) by mouth daily., Disp: 45  tablet, Rfl: 3   dimenhyDRINATE (DRIMINATE) 50 MG tablet, Take 50 mg by mouth every 8 (eight) hours as needed., Disp: , Rfl:    empagliflozin  (JARDIANCE ) 25 MG TABS tablet, Take 1 tablet (25 mg total) by mouth in the morning., Disp: 90 tablet, Rfl: 3   furosemide  (LASIX ) 20 MG tablet, Take 20 mg by mouth as needed., Disp: , Rfl:    glipiZIDE  (GLUCOTROL  XL) 10 MG 24 hr tablet, Take 2 tablets (20 mg total) by mouth daily., Disp: 180 tablet, Rfl: 3   hydrALAZINE  (APRESOLINE ) 50 MG tablet, Take 1.5 tablets (75 mg total) by mouth 3 (three) times daily., Disp: 180 tablet, Rfl: 2   isosorbide  dinitrate (ISORDIL ) 20 MG tablet, Take 2 tablets (40 mg total) by mouth 3 (three) times daily. PLEASE SCHEDULE APPOINTMENT FOR MORE REFILLS, Disp: 540 tablet, Rfl: 0   losartan  (COZAAR ) 50 MG tablet, Take 1.5 tablets (75 mg total) by mouth 2 (two) times daily. PLEASE SCHEDULE APPOINTMENT FOR MORE REFILLS [phone] OPTION 2, Disp: 90 tablet, Rfl: 0   metFORMIN  (GLUCOPHAGE ) 1000 MG tablet, Take 1 tablet (1,000 mg total) by mouth 2 (two) times daily with a meal., Disp: 180 tablet, Rfl: 3   Multiple Vitamin (MULTIVITAMIN WITH MINERALS) TABS tablet, Take 1 tablet by mouth daily. Centrum Silver, Disp: , Rfl:    Semaglutide , 2 MG/DOSE, (OZEMPIC , 2 MG/DOSE,) 8 MG/3ML SOPN, Inject 2 mg into the skin once a week., Disp: 3 mL, Rfl: 1   simvastatin  (ZOCOR ) 20 MG tablet, Take 1 tablet (20 mg total) by mouth daily. PLEASE SCHEDULE APPOINTMENT FOR MORE REFILLS [phone] OPTION 2, Disp: 60 tablet, Rfl: 0   spironolactone  (ALDACTONE ) 25 MG tablet, Take 1 tablet (25 mg total) by mouth every evening. PLEASE SCHEDULE APPOINTMENT FOR MORE REFILLS  [phone] OPTION 2, Disp: 30 tablet, Rfl: 0   TRUEPLUS LANCETS 30G MISC, 1 each by Other route as directed. Use as directed., Disp: , Rfl: 5   UNIFINE PENTIPS 32G X 4 MM MISC, 1 each by Other route as directed. , Disp: , Rfl:

## 2024-12-25 NOTE — Progress Notes (Signed)
" °  Echocardiogram 2D Echocardiogram has been performed.  Koleen KANDICE Popper, RDCS 12/25/2024, 10:29 AM "

## 2025-01-04 ENCOUNTER — Ambulatory Visit: Payer: HMO

## 2025-04-05 ENCOUNTER — Ambulatory Visit: Payer: HMO

## 2025-04-08 ENCOUNTER — Ambulatory Visit (HOSPITAL_COMMUNITY)

## 2025-07-05 ENCOUNTER — Ambulatory Visit: Payer: HMO

## 2025-09-21 ENCOUNTER — Inpatient Hospital Stay: Admitting: Hematology and Oncology

## 2025-10-04 ENCOUNTER — Ambulatory Visit: Payer: HMO

## 2026-01-03 ENCOUNTER — Ambulatory Visit: Payer: HMO

## 2026-04-04 ENCOUNTER — Ambulatory Visit: Payer: HMO
